# Patient Record
Sex: Male | Born: 1940 | Race: White | Hispanic: No | Marital: Married | Smoking: Former smoker
Health system: Southern US, Community
[De-identification: ages and names within clinical notes are randomized; demographics above are authoritative.]

## PROBLEM LIST (undated history)

## (undated) DIAGNOSIS — I480 Paroxysmal atrial fibrillation: Secondary | ICD-10-CM

## (undated) DIAGNOSIS — K52832 Lymphocytic colitis: Secondary | ICD-10-CM

## (undated) DIAGNOSIS — H409 Unspecified glaucoma: Secondary | ICD-10-CM

## (undated) DIAGNOSIS — H269 Unspecified cataract: Secondary | ICD-10-CM

## (undated) DIAGNOSIS — Z8719 Personal history of other diseases of the digestive system: Secondary | ICD-10-CM

## (undated) DIAGNOSIS — J449 Chronic obstructive pulmonary disease, unspecified: Secondary | ICD-10-CM

## (undated) DIAGNOSIS — I442 Atrioventricular block, complete: Secondary | ICD-10-CM

## (undated) DIAGNOSIS — K859 Acute pancreatitis without necrosis or infection, unspecified: Secondary | ICD-10-CM

## (undated) DIAGNOSIS — Z950 Presence of cardiac pacemaker: Secondary | ICD-10-CM

## (undated) DIAGNOSIS — K219 Gastro-esophageal reflux disease without esophagitis: Secondary | ICD-10-CM

## (undated) DIAGNOSIS — B019 Varicella without complication: Secondary | ICD-10-CM

## (undated) DIAGNOSIS — M359 Systemic involvement of connective tissue, unspecified: Secondary | ICD-10-CM

## (undated) DIAGNOSIS — M1990 Unspecified osteoarthritis, unspecified site: Secondary | ICD-10-CM

## (undated) DIAGNOSIS — C801 Malignant (primary) neoplasm, unspecified: Secondary | ICD-10-CM

## (undated) DIAGNOSIS — I951 Orthostatic hypotension: Secondary | ICD-10-CM

## (undated) DIAGNOSIS — Z8711 Personal history of peptic ulcer disease: Secondary | ICD-10-CM

## (undated) DIAGNOSIS — E785 Hyperlipidemia, unspecified: Secondary | ICD-10-CM

## (undated) DIAGNOSIS — K5792 Diverticulitis of intestine, part unspecified, without perforation or abscess without bleeding: Secondary | ICD-10-CM

## (undated) HISTORY — DX: Lymphocytic colitis: K52.832

## (undated) HISTORY — DX: Hyperlipidemia, unspecified: E78.5

## (undated) HISTORY — DX: Diverticulitis of intestine, part unspecified, without perforation or abscess without bleeding: K57.92

## (undated) HISTORY — DX: Chronic obstructive pulmonary disease, unspecified: J44.9

## (undated) HISTORY — PX: APPENDECTOMY: SHX54

## (undated) HISTORY — DX: Unspecified osteoarthritis, unspecified site: M19.90

## (undated) HISTORY — PX: TONSILLECTOMY AND ADENOIDECTOMY: SUR1326

## (undated) HISTORY — PX: PACEMAKER INSERTION: SHX728

## (undated) HISTORY — PX: COLON SURGERY: SHX602

## (undated) HISTORY — PX: BACK SURGERY: SHX140

## (undated) HISTORY — PX: OTHER SURGICAL HISTORY: SHX169

## (undated) HISTORY — DX: Personal history of peptic ulcer disease: Z87.11

## (undated) HISTORY — PX: EYE SURGERY: SHX253

## (undated) HISTORY — DX: Acute pancreatitis without necrosis or infection, unspecified: K85.90

## (undated) HISTORY — DX: Unspecified glaucoma: H40.9

## (undated) HISTORY — DX: Personal history of other diseases of the digestive system: Z87.19

## (undated) HISTORY — PX: CARDIAC CATHETERIZATION: SHX172

## (undated) HISTORY — PX: CATARACT EXTRACTION W/ INTRAOCULAR LENS IMPLANT: SHX1309

## (undated) HISTORY — DX: Varicella without complication: B01.9

## (undated) HISTORY — DX: Unspecified cataract: H26.9

---

## 1991-02-21 HISTORY — PX: INSERT / REPLACE / REMOVE PACEMAKER: SUR710

## 2011-04-27 LAB — HM COLONOSCOPY

## 2013-02-20 DIAGNOSIS — H409 Unspecified glaucoma: Secondary | ICD-10-CM

## 2013-02-20 HISTORY — DX: Unspecified glaucoma: H40.9

## 2013-05-26 LAB — BASIC METABOLIC PANEL
Creatinine: 1.1 mg/dL (ref 0.6–1.3)
Glucose: 92 mg/dL
Potassium: 4.6 mmol/L (ref 3.4–5.3)
Sodium: 138 mmol/L (ref 137–147)

## 2013-05-26 LAB — LIPID PANEL
Cholesterol: 134 mg/dL (ref 0–200)
HDL: 55 mg/dL (ref 35–70)
LDL Cholesterol: 62 mg/dL
Triglycerides: 87 mg/dL (ref 40–160)

## 2013-05-26 LAB — HEPATIC FUNCTION PANEL
ALT: 14 U/L (ref 10–40)
AST: 27 U/L (ref 14–40)
Alkaline Phosphatase: 55 U/L (ref 25–125)
Bilirubin, Total: 0.4 mg/dL

## 2014-01-27 ENCOUNTER — Ambulatory Visit (INDEPENDENT_AMBULATORY_CARE_PROVIDER_SITE_OTHER): Payer: Medicare PPO | Admitting: Family Medicine

## 2014-01-27 ENCOUNTER — Encounter (INDEPENDENT_AMBULATORY_CARE_PROVIDER_SITE_OTHER): Payer: Self-pay

## 2014-01-27 ENCOUNTER — Encounter: Payer: Self-pay | Admitting: Family Medicine

## 2014-01-27 VITALS — BP 118/76 | HR 80 | Temp 97.9°F | Ht 66.5 in | Wt 166.0 lb

## 2014-01-27 DIAGNOSIS — E781 Pure hyperglyceridemia: Secondary | ICD-10-CM

## 2014-01-27 DIAGNOSIS — R351 Nocturia: Secondary | ICD-10-CM

## 2014-01-27 DIAGNOSIS — N401 Enlarged prostate with lower urinary tract symptoms: Secondary | ICD-10-CM | POA: Insufficient documentation

## 2014-01-27 DIAGNOSIS — Z950 Presence of cardiac pacemaker: Secondary | ICD-10-CM | POA: Insufficient documentation

## 2014-01-27 DIAGNOSIS — Z87898 Personal history of other specified conditions: Secondary | ICD-10-CM | POA: Insufficient documentation

## 2014-01-27 DIAGNOSIS — I10 Essential (primary) hypertension: Secondary | ICD-10-CM

## 2014-01-27 DIAGNOSIS — Z86010 Personal history of colonic polyps: Secondary | ICD-10-CM

## 2014-01-27 DIAGNOSIS — Z9189 Other specified personal risk factors, not elsewhere classified: Secondary | ICD-10-CM

## 2014-01-27 DIAGNOSIS — Z720 Tobacco use: Secondary | ICD-10-CM | POA: Insufficient documentation

## 2014-01-27 DIAGNOSIS — E785 Hyperlipidemia, unspecified: Secondary | ICD-10-CM

## 2014-01-27 DIAGNOSIS — M5431 Sciatica, right side: Secondary | ICD-10-CM

## 2014-01-27 LAB — COMPREHENSIVE METABOLIC PANEL
ALT: 17 U/L (ref 0–53)
AST: 26 U/L (ref 0–37)
Albumin: 4.2 g/dL (ref 3.5–5.2)
Alkaline Phosphatase: 41 U/L (ref 39–117)
BUN: 17 mg/dL (ref 6–23)
CO2: 26 mEq/L (ref 19–32)
Calcium: 9.3 mg/dL (ref 8.4–10.5)
Chloride: 104 mEq/L (ref 96–112)
Creatinine, Ser: 1.2 mg/dL (ref 0.4–1.5)
GFR: 62.46 mL/min (ref >60.00)
Glucose, Bld: 93 mg/dL (ref 70–99)
Potassium: 4.6 mEq/L (ref 3.5–5.1)
Sodium: 137 mEq/L (ref 135–145)
Total Bilirubin: 1 mg/dL (ref 0.2–1.2)
Total Protein: 6.5 g/dL (ref 6.0–8.3)

## 2014-01-27 LAB — LIPID PANEL
Cholesterol: 133 mg/dL (ref 0–200)
HDL: 57.1 mg/dL (ref >39.00)
LDL Cholesterol: 58 mg/dL (ref 0–99)
NonHDL: 75.9
Total CHOL/HDL Ratio: 2
Triglycerides: 91 mg/dL (ref 0.0–149.0)
VLDL: 18.2 mg/dL (ref 0.0–40.0)

## 2014-01-27 LAB — PSA, MEDICARE: PSA: 0.38 ng/ml (ref 0.10–4.00)

## 2014-01-27 NOTE — Assessment & Plan Note (Signed)
Currently asymptomatic. Continue current rx and will review records once I receive them.

## 2014-01-27 NOTE — Assessment & Plan Note (Signed)
Very well controlled.  Did discussed decreasing dose of statin- we agreed to allow me to review his records first.

## 2014-01-27 NOTE — Progress Notes (Signed)
Subjective:   Patient ID: Darryl White, male    DOB: 02-10-1941, 73 y.o.   MRN: [id number]  Darryl White is a pleasant 73 y.o. year old male who presents to clinic today with [place]  on 01/27/2014  HPI:  Recently moved here from [place], [place] with his wife to be closer to his children and grandchildren. Retired Nature conservation officer.  Syncopal episodes- started 10-12 years ago.  Would occur 2-3 times per week.  Was referred to cardiologist at [facility].  After multiple tests, per pt, it was determined that episodes were caused by "constriction of vessel in his brain" that was leading to decreased oxygen to his brain.  He was then placed on Florinef and atenolol and pacer placed.  He has had no further episodes- that was 5 years ago.  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  Brings last in with him today from 05/2013. Lab Results  Component Value Date   CHOL 134 05/26/2013   HDL 55 05/26/2013   LDLCALC 62 05/26/2013   TRIG 87 05/26/2013   Tobacco abuse- > 35 year pack history.  Still smoking but feels he would consider quitting in future. Mom died of lung CA.  Has never had a low dose CT of chest for lung CA screening.  Denies hemoptysis, weight loss, fevers, chills or night sweats.  Low back pain with right sided sciatica- intermittent.  Uses very occasional tramadol- maybe once every few months.  Colon polyps- colonoscopy UTD (2013).  No recent blood in stool or changes in bowel habits.  Nocturia- increased in frequency over past 1-2 years.  Has no difficulty starting or stopping his stream.  No dysuria.  No pelvic pain. Has never taken any rx for BPH.  Per pt, had DRE in 05/2013. No current outpatient prescriptions on file prior to visit.   No current facility-administered medications on file prior to visit.    Allergies  Allergen Reactions  . Sulfa Antibiotics Hives and Itching  . Penicillins Hives and Palpitations    Past  Medical History  Diagnosis Date  . Chicken pox   . Diverticulitis   . History of fainting spells of unknown cause   . History of stomach ulcers   . Hyperlipidemia     Past Surgical History  Procedure Laterality Date  . Appendectomy    . Tonsillectomy and adenoidectomy    . Large intestine block    . Back surgery      Family History  Problem Relation Age of Onset  . Cancer Mother   . Heart disease Father     History   Social History  . Marital Status: Married    Spouse Name: N/A    Number of Children: N/A  . Years of Education: N/A   Occupational History  . Not on file.   Social History Main Topics  . Smoking status: Current Every Day Smoker  . Smokeless tobacco: Never Used  . Alcohol Use: Yes  . Drug Use: No  . Sexual Activity: No   Other Topics Concern  . Not on file   Social History Narrative   Married.   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   The PMH, Brush, Social History, Family History, Medications, and allergies have been reviewed in [facility], and have been updated if relevant.   Review of Systems  Constitutional: Negative for fever, chills, diaphoresis, appetite change, fatigue and unexpected weight change.  HENT: Negative.   Eyes: Negative.  Respiratory: Negative.   Cardiovascular: Negative.   Gastrointestinal: Negative.   Endocrine: Negative.   Musculoskeletal: Negative.   Skin: Negative.   Allergic/Immunologic: Negative.   Neurological: Negative.   Hematological: Negative.   Psychiatric/Behavioral: Negative.   All other systems reviewed and are negative.      Objective:    BP 118/76 mmHg  Pulse 80  Temp(Src) 97.9 F (36.6 C) (Oral)  Ht 5' 6.5" (1.689 m)  Wt 166 lb (75.297 kg)  BMI 26.39 kg/m2  SpO2 94%   Physical Exam  Constitutional: He is oriented to person, place, and time. He appears well-developed and well-nourished. No distress.  HENT:  Head: Normocephalic and atraumatic.  Eyes: Pupils are equal, round, and  reactive to light.  Cardiovascular: Normal rate, regular rhythm and normal heart sounds.   No murmur heard. Pulmonary/Chest: Effort normal and breath sounds normal. No respiratory distress. He has no wheezes. He has no rales. He exhibits no tenderness.  Musculoskeletal: Normal range of motion. He exhibits no edema.  Neurological: He is alert and oriented to person, place, and time. No cranial nerve deficit.  Skin: Skin is warm and dry.  Psychiatric: He has a normal mood and affect. His behavior is normal. Judgment and thought content normal.  Nursing note and vitals reviewed.         Assessment & Plan:   HLD (hyperlipidemia) - Plan: Comprehensive metabolic panel, Lipid panel  Essential hypertension  History of syncope  History of colonic polyps  Cardiac pacemaker  Hypertriglyceridemia  Sciatica of right side  Nocturia associated with benign prostatic hypertrophy - Plan: PSA, Medicare  Tobacco abuse - Plan: CT CHEST LOW DOSE SCREENING W/O CM No Follow-up on file.

## 2014-01-27 NOTE — Assessment & Plan Note (Signed)
New over past year or two.  Likely LUTs due to BPH. Per pt, unremarkable DRE in 05/2013. Check PSA today. He does not want rx at this time.

## 2014-01-27 NOTE — Assessment & Plan Note (Signed)
Per pt, placed after his persistent syncopal episodes- awaiting records.

## 2014-01-27 NOTE — Patient Instructions (Addendum)
It was lovely to meet you, Darryl White. I look forward to meeting your wife.  We will call you with your lab results and you can view them online.  We will call you with your low radiation dose chest CT.

## 2014-01-27 NOTE — Assessment & Plan Note (Signed)
Current asymptomatic. Agreed to refill occasional tramadol prn as he it seems he is using this properly (awaiting records).

## 2014-01-27 NOTE — Progress Notes (Signed)
Pre visit review using our clinic review tool, if applicable. No additional management support is needed unless otherwise documented below in the visit note. 

## 2014-01-27 NOTE — Assessment & Plan Note (Signed)
Smoking cessation instruction/counseling given:  counseled patient on the dangers of tobacco use, advised patient to stop smoking, and reviewed strategies to maximize success   He is not ready to quit but he is willing to have lung CA screening yearly- discussed risks and benefits of low radiation lung CT and order placed.

## 2014-01-28 ENCOUNTER — Telehealth: Payer: Self-pay | Admitting: Family Medicine

## 2014-01-28 NOTE — Telephone Encounter (Signed)
emmi emailed °

## 2014-01-29 ENCOUNTER — Telehealth: Payer: Self-pay | Admitting: Family Medicine

## 2014-01-29 ENCOUNTER — Encounter: Payer: Self-pay | Admitting: *Deleted

## 2014-01-29 NOTE — Telephone Encounter (Signed)
emmi emailed °

## 2014-02-09 ENCOUNTER — Encounter: Payer: Self-pay | Admitting: Family Medicine

## 2014-02-16 ENCOUNTER — Encounter: Payer: Self-pay | Admitting: Family Medicine

## 2014-02-17 ENCOUNTER — Encounter: Payer: Self-pay | Admitting: Family Medicine

## 2014-02-23 ENCOUNTER — Other Ambulatory Visit: Payer: Self-pay | Admitting: Family Medicine

## 2014-02-23 NOTE — Telephone Encounter (Signed)
Rx called in to requested pharmacy 

## 2014-02-23 NOTE — Telephone Encounter (Signed)
Pt requesting medication refill. Pt recently had establish appt 01/2014. pls advise

## 2014-02-24 ENCOUNTER — Encounter: Payer: Self-pay | Admitting: Family Medicine

## 2014-03-04 ENCOUNTER — Ambulatory Visit (INDEPENDENT_AMBULATORY_CARE_PROVIDER_SITE_OTHER)
Admission: RE | Admit: 2014-03-04 | Discharge: 2014-03-04 | Disposition: A | Discharge status: Home / Self Care | Payer: Medicare PPO | Source: Ambulatory Visit | Attending: Family Medicine | Admitting: Family Medicine

## 2014-03-04 DIAGNOSIS — Z720 Tobacco use: Secondary | ICD-10-CM

## 2014-03-05 ENCOUNTER — Telehealth: Payer: Self-pay | Admitting: Family Medicine

## 2014-03-05 NOTE — Telephone Encounter (Signed)
Patient saw you in December and wants to know when you want him to follow up. He said he usually follows up every 3-4 months and he has a lipid panel done.

## 2014-03-06 NOTE — Telephone Encounter (Signed)
Every three to four months if good with me as well.

## 2014-03-06 NOTE — Telephone Encounter (Signed)
Spoke to pt and advised per Dr Aron; pt verbally expressed understanding.  

## 2014-03-18 ENCOUNTER — Telehealth: Payer: Self-pay | Admitting: Family Medicine

## 2014-03-18 NOTE — Telephone Encounter (Signed)
Patient dropped off note card with his updated shots, cardiologist and GI provider.  He would like this updated information placed in his chart.  Placed card on [employer] desk. / lt

## 2014-03-18 NOTE — Telephone Encounter (Signed)
Card placed in Dr Hulen Shouts inbox for review.

## 2014-05-22 ENCOUNTER — Other Ambulatory Visit: Payer: Self-pay | Admitting: Family Medicine

## 2014-06-04 ENCOUNTER — Other Ambulatory Visit: Payer: Self-pay | Admitting: Family Medicine

## 2014-06-04 NOTE — Telephone Encounter (Signed)
Pt only seen for establish appt

## 2014-06-04 NOTE — Telephone Encounter (Signed)
Rx called in to requested pharmacy 

## 2014-09-28 ENCOUNTER — Other Ambulatory Visit: Payer: Self-pay | Admitting: Family Medicine

## 2014-09-28 NOTE — Telephone Encounter (Signed)
Rx called in to requested pharmacy 

## 2014-09-28 NOTE — Telephone Encounter (Signed)
Pt has not been seen since establishing care 01/2014

## 2014-10-12 ENCOUNTER — Observation Stay (HOSPITAL_COMMUNITY): Payer: Medicare PPO

## 2014-10-12 ENCOUNTER — Encounter (HOSPITAL_COMMUNITY): Payer: Self-pay | Admitting: Emergency Medicine

## 2014-10-12 ENCOUNTER — Other Ambulatory Visit (HOSPITAL_COMMUNITY): Payer: Medicare PPO

## 2014-10-12 ENCOUNTER — Emergency Department (HOSPITAL_COMMUNITY): Payer: Medicare PPO

## 2014-10-12 ENCOUNTER — Observation Stay (HOSPITAL_COMMUNITY)
Admission: EM | Admit: 2014-10-12 | Discharge: 2014-10-13 | Disposition: A | Discharge status: Home / Self Care | Payer: Medicare PPO | Attending: Cardiovascular Disease | Admitting: Cardiovascular Disease

## 2014-10-12 DIAGNOSIS — R0602 Shortness of breath: Secondary | ICD-10-CM | POA: Diagnosis not present

## 2014-10-12 DIAGNOSIS — R000 Tachycardia, unspecified: Secondary | ICD-10-CM | POA: Diagnosis not present

## 2014-10-12 DIAGNOSIS — I480 Paroxysmal atrial fibrillation: Secondary | ICD-10-CM | POA: Insufficient documentation

## 2014-10-12 DIAGNOSIS — Z880 Allergy status to penicillin: Secondary | ICD-10-CM | POA: Insufficient documentation

## 2014-10-12 DIAGNOSIS — I442 Atrioventricular block, complete: Secondary | ICD-10-CM | POA: Insufficient documentation

## 2014-10-12 DIAGNOSIS — R071 Chest pain on breathing: Secondary | ICD-10-CM | POA: Diagnosis not present

## 2014-10-12 DIAGNOSIS — Z87898 Personal history of other specified conditions: Secondary | ICD-10-CM

## 2014-10-12 DIAGNOSIS — K5792 Diverticulitis of intestine, part unspecified, without perforation or abscess without bleeding: Secondary | ICD-10-CM | POA: Diagnosis not present

## 2014-10-12 DIAGNOSIS — E785 Hyperlipidemia, unspecified: Secondary | ICD-10-CM | POA: Diagnosis present

## 2014-10-12 DIAGNOSIS — Z7982 Long term (current) use of aspirin: Secondary | ICD-10-CM | POA: Diagnosis not present

## 2014-10-12 DIAGNOSIS — Z79899 Other long term (current) drug therapy: Secondary | ICD-10-CM | POA: Insufficient documentation

## 2014-10-12 DIAGNOSIS — Z8619 Personal history of other infectious and parasitic diseases: Secondary | ICD-10-CM | POA: Diagnosis not present

## 2014-10-12 DIAGNOSIS — Z7951 Long term (current) use of inhaled steroids: Secondary | ICD-10-CM | POA: Insufficient documentation

## 2014-10-12 DIAGNOSIS — Z720 Tobacco use: Secondary | ICD-10-CM | POA: Insufficient documentation

## 2014-10-12 DIAGNOSIS — I4891 Unspecified atrial fibrillation: Secondary | ICD-10-CM | POA: Diagnosis not present

## 2014-10-12 DIAGNOSIS — Z950 Presence of cardiac pacemaker: Secondary | ICD-10-CM | POA: Diagnosis present

## 2014-10-12 DIAGNOSIS — I951 Orthostatic hypotension: Secondary | ICD-10-CM | POA: Diagnosis not present

## 2014-10-12 DIAGNOSIS — I959 Hypotension, unspecified: Secondary | ICD-10-CM

## 2014-10-12 DIAGNOSIS — R079 Chest pain, unspecified: Secondary | ICD-10-CM | POA: Diagnosis present

## 2014-10-12 HISTORY — DX: Orthostatic hypotension: I95.1

## 2014-10-12 HISTORY — DX: Paroxysmal atrial fibrillation: I48.0

## 2014-10-12 HISTORY — DX: Atrioventricular block, complete: I44.2

## 2014-10-12 LAB — BASIC METABOLIC PANEL
Anion gap: 9 (ref 5–15)
BUN: 21 mg/dL — ABNORMAL HIGH (ref 6–20)
CO2: 21 mmol/L — ABNORMAL LOW (ref 22–32)
Calcium: 9.1 mg/dL (ref 8.9–10.3)
Chloride: 104 mmol/L (ref 101–111)
Creatinine, Ser: 1.2 mg/dL (ref 0.61–1.24)
GFR calc Af Amer: >60 mL/min (ref >60)
GFR calc non Af Amer: 58 mL/min — ABNORMAL LOW (ref >60)
Glucose, Bld: 111 mg/dL — ABNORMAL HIGH (ref 65–99)
Potassium: 4.1 mmol/L (ref 3.5–5.1)
Sodium: 134 mmol/L — ABNORMAL LOW (ref 135–145)

## 2014-10-12 LAB — CBC
HCT: 41.2 % (ref 39.0–52.0)
Hemoglobin: 14.2 g/dL (ref 13.0–17.0)
MCH: 33.5 pg (ref 26.0–34.0)
MCHC: 34.5 g/dL (ref 30.0–36.0)
MCV: 97.2 fL (ref 78.0–100.0)
Platelets: 196 10*3/uL (ref 150–400)
RBC: 4.24 MIL/uL (ref 4.22–5.81)
RDW: 13.8 % (ref 11.5–15.5)
WBC: 8.4 10*3/uL (ref 4.0–10.5)

## 2014-10-12 LAB — URINALYSIS, ROUTINE W REFLEX MICROSCOPIC
Bilirubin Urine: NEGATIVE
Glucose, UA: NEGATIVE mg/dL
Hgb urine dipstick: NEGATIVE
Ketones, ur: NEGATIVE mg/dL
Leukocytes, UA: NEGATIVE
Nitrite: NEGATIVE
Protein, ur: NEGATIVE mg/dL
Specific Gravity, Urine: 1.012 (ref 1.005–1.030)
Urobilinogen, UA: 0.2 mg/dL (ref 0.0–1.0)
pH: 6 (ref 5.0–8.0)

## 2014-10-12 LAB — LIPID PANEL
Cholesterol: 109 mg/dL (ref 0–200)
HDL: 45 mg/dL (ref >40)
LDL Cholesterol: 50 mg/dL (ref 0–99)
Total CHOL/HDL Ratio: 2.4 RATIO
Triglycerides: 70 mg/dL (ref <150)
VLDL: 14 mg/dL (ref 0–40)

## 2014-10-12 LAB — T4, FREE: Free T4: 0.99 ng/dL (ref 0.61–1.12)

## 2014-10-12 LAB — HEPATIC FUNCTION PANEL
ALT: 17 U/L (ref 17–63)
AST: 43 U/L — ABNORMAL HIGH (ref 15–41)
Albumin: 3.4 g/dL — ABNORMAL LOW (ref 3.5–5.0)
Alkaline Phosphatase: 39 U/L (ref 38–126)
Bilirubin, Direct: 0.2 mg/dL (ref 0.1–0.5)
Indirect Bilirubin: 0.3 mg/dL (ref 0.3–0.9)
Total Bilirubin: 0.5 mg/dL (ref 0.3–1.2)
Total Protein: 5.4 g/dL — ABNORMAL LOW (ref 6.5–8.1)

## 2014-10-12 LAB — TSH: TSH: 1.403 u[IU]/mL (ref 0.350–4.500)

## 2014-10-12 LAB — HEPARIN LEVEL (UNFRACTIONATED): Heparin Unfractionated: 0.56 IU/mL (ref 0.30–0.70)

## 2014-10-12 LAB — MAGNESIUM: Magnesium: 1.9 mg/dL (ref 1.7–2.4)

## 2014-10-12 LAB — I-STAT TROPONIN, ED: Troponin i, poc: 0.02 ng/mL (ref 0.00–0.08)

## 2014-10-12 MED ORDER — DILTIAZEM HCL 100 MG IV SOLR: 5.0000 mg/h | INTRAVENOUS | Status: DC

## 2014-10-12 MED ORDER — SODIUM CHLORIDE 0.9 % IV BOLUS (SEPSIS)
1000.0000 mL | Freq: Once | INTRAVENOUS | Status: AC
Start: 2014-10-12 — End: 2014-10-12
  Administered 2014-10-12: 1000 mL via INTRAVENOUS

## 2014-10-12 MED ORDER — HEPARIN BOLUS VIA INFUSION
4000.0000 [IU] | Freq: Once | INTRAVENOUS | Status: AC
  Administered 2014-10-12: 4000 [IU] via INTRAVENOUS
  Filled 2014-10-12: qty 4000

## 2014-10-12 MED ORDER — VITAMIN D 1000 UNITS PO TABS
2000.0000 [IU] | ORAL_TABLET | Freq: Every day | ORAL | Status: DC
  Administered 2014-10-13: 2000 [IU] via ORAL
  Filled 2014-10-12 (×2): qty 2

## 2014-10-12 MED ORDER — TECHNETIUM TC 99M SESTAMIBI GENERIC - CARDIOLITE
30.0000 | Freq: Once | INTRAVENOUS | Status: AC | PRN
  Administered 2014-10-12: 30 via INTRAVENOUS

## 2014-10-12 MED ORDER — ONDANSETRON HCL 4 MG/2ML IJ SOLN: 4.0000 mg | Freq: Four times a day (QID) | INTRAMUSCULAR | Status: DC | PRN

## 2014-10-12 MED ORDER — AMIODARONE IV BOLUS ONLY 150 MG/100ML: 150.0000 mg | Freq: Once | INTRAVENOUS | Status: DC

## 2014-10-12 MED ORDER — ASPIRIN 81 MG PO CHEW
81.0000 mg | CHEWABLE_TABLET | Freq: Every day | ORAL | Status: DC
  Administered 2014-10-13: 81 mg via ORAL
  Filled 2014-10-12: qty 1

## 2014-10-12 MED ORDER — AMIODARONE LOAD VIA INFUSION
150.0000 mg | Freq: Once | INTRAVENOUS | Status: AC
  Administered 2014-10-12: 150 mg via INTRAVENOUS
  Filled 2014-10-12: qty 83.34

## 2014-10-12 MED ORDER — ZOLPIDEM TARTRATE 5 MG PO TABS
5.0000 mg | ORAL_TABLET | Freq: Every evening | ORAL | Status: DC | PRN
  Administered 2014-10-12: 5 mg via ORAL
  Filled 2014-10-12: qty 1

## 2014-10-12 MED ORDER — HEPARIN (PORCINE) IN NACL 100-0.45 UNIT/ML-% IJ SOLN
1200.0000 [IU]/h | INTRAMUSCULAR | Status: DC
  Administered 2014-10-12 (×2): 1200 [IU]/h via INTRAVENOUS
  Filled 2014-10-12 (×3): qty 250

## 2014-10-12 MED ORDER — REGADENOSON 0.4 MG/5ML IV SOLN
INTRAVENOUS | Status: AC
  Administered 2014-10-12: 0.4 mg via INTRAVENOUS
  Filled 2014-10-12: qty 5

## 2014-10-12 MED ORDER — ACETAMINOPHEN 325 MG PO TABS: 650.0000 mg | ORAL_TABLET | ORAL | Status: DC | PRN

## 2014-10-12 MED ORDER — TRAMADOL HCL 50 MG PO TABS: 50.0000 mg | ORAL_TABLET | ORAL | Status: DC | PRN

## 2014-10-12 MED ORDER — DILTIAZEM LOAD VIA INFUSION
10.0000 mg | Freq: Once | INTRAVENOUS | Status: DC
  Filled 2014-10-12: qty 10

## 2014-10-12 MED ORDER — TECHNETIUM TC 99M SESTAMIBI GENERIC - CARDIOLITE
10.0000 | Freq: Once | INTRAVENOUS | Status: AC | PRN
  Administered 2014-10-12: 10 via INTRAVENOUS

## 2014-10-12 MED ORDER — REGADENOSON 0.4 MG/5ML IV SOLN
0.4000 mg | Freq: Once | INTRAVENOUS | Status: AC
  Administered 2014-10-12: 0.4 mg via INTRAVENOUS

## 2014-10-12 MED ORDER — BRIMONIDINE TARTRATE 0.2 % OP SOLN
1.0000 [drp] | Freq: Two times a day (BID) | OPHTHALMIC | Status: DC
  Filled 2014-10-12: qty 5

## 2014-10-12 MED ORDER — FLUDROCORTISONE ACETATE 0.1 MG PO TABS: 0.1000 mg | ORAL_TABLET | ORAL | Status: DC

## 2014-10-12 MED ORDER — AMIODARONE HCL IN DEXTROSE 360-4.14 MG/200ML-% IV SOLN
60.0000 mg/h | Freq: Once | INTRAVENOUS | Status: AC
  Administered 2014-10-12: 60 mg/h via INTRAVENOUS
  Filled 2014-10-12: qty 200

## 2014-10-12 MED ORDER — BRIMONIDINE TARTRATE 0.15 % OP SOLN
1.0000 [drp] | Freq: Two times a day (BID) | OPHTHALMIC | Status: DC
  Filled 2014-10-12 (×2): qty 5

## 2014-10-12 MED ORDER — SIMVASTATIN 40 MG PO TABS
40.0000 mg | ORAL_TABLET | Freq: Every day | ORAL | Status: DC
  Administered 2014-10-12: 40 mg via ORAL
  Filled 2014-10-12: qty 1

## 2014-10-12 MED ORDER — FENOFIBRATE 160 MG PO TABS
160.0000 mg | ORAL_TABLET | Freq: Every day | ORAL | Status: DC
  Administered 2014-10-13: 160 mg via ORAL
  Filled 2014-10-12: qty 1

## 2014-10-12 NOTE — ED Notes (Signed)
Pt not in room- pt remains in NM

## 2014-10-12 NOTE — ED Notes (Signed)
Patient here with complaint of chest pressure starting this AM coupled with shortness of breath. Explains that he was laying down attempting to sleep and could not get comfortable. Could feel "fluttering" in his chest at that time. EMS gave 324ASA, 1x NTG SL 0.'4mg'$ , and approximately 514m of NS.

## 2014-10-12 NOTE — ED Notes (Signed)
MD at bedside. 

## 2014-10-12 NOTE — ED Provider Notes (Signed)
CSN: [id number]     Arrival date & time 10/12/14  0151 History   This chart was scribed for Yusra Ravert, MD by Forrestine Him, ED Scribe. This patient was seen in room B16C/B16C and the patient's care was started 2:05 AM.   Chief Complaint  Patient presents with  . Chest Pain   Patient is a 74 y.o. male presenting with palpitations. The history is provided by the patient. No language interpreter was used.  Palpitations Palpitations quality:  Fast Onset quality:  Sudden Timing:  Constant Progression:  Unchanged Chronicity:  New Context: not anxiety   Relieved by:  Nothing Worsened by:  Nothing Ineffective treatments:  None tried Associated symptoms: chest pain and shortness of breath   Associated symptoms: no cough, no dizziness, no nausea and no vomiting     HPI Comments: Zaylin Runco brought in by EMS is a 74 y.o. male with a PMHx of [employer]tr[employer] pacemaker and hyperlipidemia who presents to the Emergency Department complaining of constant, ongoing chest pain with associated mild shortness of breath onset 2 hours prior to arrival while resting in bed. Pain is described as pressure and "fluttering" in chest. 324 ASA and 1 dose of Nitro SL given en route to department. No recent fever or chills. Mr. Miles is followed by Dr. Dudley Major at [facility]. Past implanted device performed by Dr. Marjo Bicker at [facility]- most recently 5 years ago, but initial device implanted 12 years prior. Denies any previous stent placements. He is not on any anticoagulants. Pt with known allergies to Sulfa antibiotics and Penicillins.  Past Medical History  Diagnosis Date  . Chicken pox   . Diverticulitis   . History of fainting spells of unknown cause   . History of stomach ulcers   . Hyperlipidemia    Past Surgical History  Procedure Laterality Date  . Appendectomy    . Tonsillectomy and adenoidectomy    . Large intestine block    . Back surgery     Family History  Problem Relation Age of Onset  .  Cancer Mother   . Heart disease Father    Social History  Substance Use Topics  . Smoking status: Current Every Day Smoker  . Smokeless tobacco: Never Used  . Alcohol Use: Yes    Review of Systems  Constitutional: Negative for fever and chills.  Respiratory: Positive for shortness of breath. Negative for cough.   Cardiovascular: Positive for chest pain and palpitations. Negative for leg swelling.  Gastrointestinal: Negative for nausea, vomiting, abdominal pain and diarrhea.  Neurological: Negative for dizziness and headaches.  Psychiatric/Behavioral: Negative for confusion.  All other systems reviewed and are negative.     Allergies  Sulfa antibiotics and Penicillins  Home Medications   Prior to Admission medications   Medication Sig Start Date End Date Taking? Authorizing Provider  aspirin 81 MG tablet Take 81 mg by mouth daily.    Historical Provider, MD  atenolol (TENORMIN) 25 MG tablet Take 25 mg by mouth daily.    Historical Provider, MD  brimonidine (ALPHAGAN) 0.15 % ophthalmic solution Place 1 drop into both eyes daily.    Historical Provider, MD  Cholecalciferol (VITAMIN D3) 2000 UNITS capsule Take 2,000 Units by mouth daily.    Historical Provider, MD  fenofibrate (TRICOR) 145 MG tablet Take 145 mg by mouth daily.    Historical Provider, MD  fludrocortisone (FLORINEF) 0.1 MG tablet Take 0.1 mg by mouth daily.    Historical Provider, MD  simvastatin (ZOCOR) 40 MG  tablet take 1 tablet by mouth at bedtime 05/22/14   Lucille Passy, MD  traMADol Veatrice Bourbon) 50 MG tablet take 1 tablet by mouth every 4 hours if needed 09/28/14   Lucille Passy, MD   Triage Vitals: BP 85/59 mmHg  Pulse 115  Temp(Src) 98.2 F (36.8 C) (Oral)  Resp 22  SpO2 96%   Physical Exam  Constitutional: He is oriented to person, place, and time. He appears well-developed and well-nourished. No distress.  HENT:  Head: Normocephalic and atraumatic.  Mouth/Throat: Oropharynx is clear and moist.  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  Neck: Normal range of motion.  Cardiovascular: Regular rhythm, S1 normal, S2 normal, normal heart sounds and intact distal pulses.  Tachycardia present.   Pulmonary/Chest: Effort normal and breath sounds normal. No respiratory distress. He has no wheezes. He has no rales.  Abdominal: Soft. Bowel sounds are normal. He exhibits no distension. There is no tenderness. There is no rebound and no guarding.  Musculoskeletal: Normal range of motion. He exhibits no edema.  Neurological: He is alert and oriented to person, place, and time.  Skin: Skin is warm and dry. He is not diaphoretic.  Psychiatric: He has a normal mood and affect. Judgment normal.  Nursing note and vitals reviewed.   ED Course  Procedures (including critical care time)  DIAGNOSTIC STUDIES: Oxygen Saturation is 96% on RA, adequate by my interpretation.    COORDINATION OF CARE: 2:07 AM- Will order blood work and EKG. Discussed treatment plan with pt at bedside and pt agreed to plan.     Labs Review Labs Reviewed - No data to display  Imaging Review No results found. I have personally reviewed and evaluated these images and lab results as part of my medical decision-making.   EKG Interpretation None      MDM   Final diagnoses:  None    Medications  heparin ADULT infusion 100 units/mL (25000 units/250 mL) (1,200 Units/hr Intravenous New Bag/Given 10/12/14 0602)  sodium chloride 0.9 % bolus 1,000 mL (0 mLs Intravenous Stopped 10/12/14 0325)  amiodarone (NEXTERONE PREMIX) 360 MG/200ML (1.8 mg/mL) IV infusion (60 mg/hr Intravenous New Bag/Given 10/12/14 0601)  amiodarone (NEXTERONE) 1.8 mg/mL load via infusion 150 mg (150 mg Intravenous Bolus from Bag 10/12/14 0605)  heparin bolus via infusion 4,000 Units (4,000 Units Intravenous Given 10/12/14 0603)   Results for orders placed or performed during the hospital encounter of 81/01/75  Basic metabolic panel   Result Value Ref Range   Sodium 134 (L) 135 - 145 mmol/L   Potassium 4.1 3.5 - 5.1 mmol/L   Chloride 104 101 - 111 mmol/L   CO2 21 (L) 22 - 32 mmol/L   Glucose, Bld 111 (H) 65 - 99 mg/dL   BUN 21 (H) 6 - 20 mg/dL   Creatinine, Ser 1.20 0.61 - 1.24 mg/dL   Calcium 9.1 8.9 - 10.3 mg/dL   GFR calc non Af Amer 58 (L) >60 mL/min   GFR calc Af Amer >60 >60 mL/min   Anion gap 9 5 - 15  CBC  Result Value Ref Range   WBC 8.4 4.0 - 10.5 K/uL   RBC 4.24 4.22 - 5.81 MIL/uL   Hemoglobin 14.2 13.0 - 17.0 g/dL   HCT 41.2 39.0 - 52.0 %   MCV 97.2 78.0 - 100.0 fL   MCH 33.5 26.0 - 34.0 pg   MCHC 34.5 30.0 - 36.0 g/dL   RDW 13.8  11.5 - 15.5 %   Platelets 196 150 - 400 K/uL  I-stat troponin, ED  Result Value Ref Range   Troponin i, poc 0.02 0.00 - 0.08 ng/mL   Comment 3           Dg Chest 2 View  10/12/2014   CLINICAL DATA:  Chest tightness, shortness of breath, and tachycardia. Smoker.  EXAM: CHEST  2 VIEW  COMPARISON:  CT chest 03/04/2014  FINDINGS: Cardiac pacemaker. Normal heart size and pulmonary vascularity. Mediastinal contours appear intact. Calcified and tortuous aorta. 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 Appearance is similar to previous study. No developing consolidation. No pneumothorax. No blunting of costophrenic angles.  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   Electronically Signed   By: Lucienne Capers M.D.   On: 10/12/2014 03:04    MDM Reviewed: nursing note and vitals Interpretation: labs, x-ray and ECG (all labs normal no chf by me on cxr) Total time providing critical care: 30-74 minutes. This excludes time spent performing separately reportable procedures and services. Consults: cardiology (2 medtronic reps)  CRITICAL CARE Performed by: Carlisle Beers Total critical care time: 60 minutes   Critical care time was exclusive of separately billable procedures and treating other  patients. Critical care was necessary to treat or prevent imminent or life-threatening deterioration. Critical care was time spent personally by me on the following activities: development of treatment plan with patient and/or surrogate as well as nursing, discussions with consultants, evaluation of patient's response to treatment, examination of patient, obtaining history from patient or surrogate, ordering and performing treatments and interventions, ordering and review of laboratory studies, ordering and review of radiographic studies, pulse oximetry and re-evaluation of patient's condition.   The patient appears reasonably stabilized for admission considering the current resources, flow, and capabilities available in the ED at this time, and I doubt any other [facility] requiring further screening and/or treatment in the ED prior to admission.  I personally performed the services described in this documentation, which was scribed in my presence. The recorded information has been reviewed and is accurate.    Veatrice Kells, MD 10/12/14 [phone]

## 2014-10-12 NOTE — Progress Notes (Signed)
ANTICOAGULATION CONSULT NOTE - FOLLOW UP    HL = 0.56 (goal 0.3 - 0.7 units/mL) Heparin dosing weight = 75 kg   Assessment: 73 YOM with Afib and ACS to continue on IV heparin.  Heparin level therapeutic (drawn 9 hrs post heparin started); no bleeding reported.   Plan: - Continue heparin gtt at 1200 units/hr - F/U AM labs    Victor Granados D. Mina Marble, PharmD, BCPS 10/12/2014, 4:32 PM

## 2014-10-12 NOTE — Progress Notes (Signed)
lexiscan myoview completed without complications.  Nuc results to follow. 

## 2014-10-12 NOTE — H&P (Addendum)
Patient ID: Darryl White MRN: [id number] DOB/AGE: 1940/06/19 74 y.o.  Admit date: 10/12/2014 Primary Physician   Arnette Norris, MD Primary Cardiologist   Dr. Dudley Major [facility])  Chief Complaint   Atrial fibrillation   HPI: Darryl White is a 74M with syncope/bradycardia s/p florinef and PPM, HL who presented to the ED with his first episode of afib with RVR.  Darryl White was awakened from sleep with heart pounding.  He felt chest pressure, headache and palpitations.  He did not note lightheadedness or dizziness.  There was no associated nausea or diaphoresis. The symptoms were not exertional.  He and his wife decided to present to the ED for evaluation.  EMS was called and upon their arrival he received ASA '324mg'$  and 549m of NS.  In the ED his Medtronic PPM was interrogated, and he was found to be in atrial fibrillation for <24 hours.  He was started on a heparin infusion.  His BP was 85/59 so he continued to receive IV fluids.  An amiodarone bolus and infusion was initiated.   Review of Systems:     Cardiac Review of Systems: {Y] = yes '[ ]'$  = no  Chest Pain [Blue.Reese   ]  Resting SOB [   ] Exertional SOB  [  ]  Orthopnea [  ]   Pedal Edema [   ]    Palpitations [ y ] Syncope  [  ]   Presyncope [   ]  General Review of Systems: [Y] = yes [  ]=no Constitional: recent weight change [  ]; anorexia [  ]; fatigue [  ]; nausea [  ]; night sweats [  ]; fever [  ]; or chills [  ];                                                                                                                                        Dental: poor dentition[  ];  Eye : blurred vision [  ]; diplopia [   ]; vision changes [  ];  Amaurosis fugax[  ]; Resp: cough [  ];  wheezing[  ];  hemoptysis[  ]; shortness of breath[  ]; paroxysmal nocturnal dyspnea[  ]; dyspnea on exertion[  ]; or orthopnea[  ];  GI:  gallstones[  ], vomiting[  ];  dysphagia[  ]; melena[  ];  hematochezia [  ]; heartburn[  ];   Hx of  Colonoscopy[  ]; GU:  kidney stones [  ]; hematuria[  ];   dysuria [  ];  nocturia[  ];  history of     obstruction [  ];                 Skin: rash, swelling[  ];, hair loss[  ];  peripheral edema[  ];  or itching[  ]; Musculosketetal: myalgias[  ];  joint  swelling[  ];  joint erythema[  ];  joint pain[  ];  back pain[  ];  Heme/Lymph: bruising[  ];  bleeding[  ];  anemia[  ];  Neuro: TIA[  ];  headaches[y  ];  stroke[  ];  vertigo[  ];  seizures[  ];   paresthesias[  ];  difficulty walking[  ];  Psych:depression[  ]; anxiety[  ];  Endocrine: diabetes[  ];  thyroid dysfunction[  ];  Immunizations: Flu [  ]; Pneumococcal[  ];  Other:  Past Medical History  Diagnosis Date  . Chicken pox   . Diverticulitis   . History of fainting spells of unknown cause   . History of stomach ulcers   . Hyperlipidemia      (Not in a hospital admission)   Allergies  Allergen Reactions  . Sulfa Antibiotics Hives and Itching  . Penicillins Hives and Palpitations    Social History   Social History  . Marital Status: Married    Spouse Name: N/A  . Number of Children: N/A  . Years of Education: N/A   Occupational History  . Not on file.   Social History Main Topics  . Smoking status: Current Every Day Smoker  . Smokeless tobacco: Never Used  . Alcohol Use: Yes  . Drug Use: No  . Sexual Activity: No   Other Topics Concern  . Not on file   Social History Narrative   Married.   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    Family History  Problem Relation Age of Onset  . Cancer Mother   . Heart disease Father     PHYSICAL EXAM: Filed Vitals:   10/12/14 0930  BP: 89/58  Pulse: 82  Temp:   Resp: 17   General:  Well appearing. No respiratory difficulty HEENT: normal Neck: supple. no JVD. Carotids 2+ bilat; no bruits. No lymphadenopathy or thryomegaly appreciated. Cor: PMI nondisplaced  Irregularly irregular. No rubs, gallops or murmurs. Lungs: clear Abdomen: soft, nontender,  nondistended. No hepatosplenomegaly. No bruits or masses. Good bowel sounds. Extremities: no cyanosis, clubbing, rash, edema Neuro: alert & oriented x 3, cranial nerves grossly intact. moves all 4 extremities w/o difficulty. Affect pleasant.   Results for orders placed or performed during the hospital encounter of 10/12/14 (from the past 24 hour(s))  Basic metabolic panel     Status: Abnormal   Collection Time: 10/12/14  2:45 AM  Result Value Ref Range   Sodium 134 (L) 135 - 145 mmol/L   Potassium 4.1 3.5 - 5.1 mmol/L   Chloride 104 101 - 111 mmol/L   CO2 21 (L) 22 - 32 mmol/L   Glucose, Bld 111 (H) 65 - 99 mg/dL   BUN 21 (H) 6 - 20 mg/dL   Creatinine, Ser 1.20 0.61 - 1.24 mg/dL   Calcium 9.1 8.9 - 10.3 mg/dL   GFR calc non Af Amer 58 (L) >60 mL/min   GFR calc Af Amer >60 >60 mL/min   Anion gap 9 5 - 15  CBC     Status: None   Collection Time: 10/12/14  2:45 AM  Result Value Ref Range   WBC 8.4 4.0 - 10.5 K/uL   RBC 4.24 4.22 - 5.81 MIL/uL   Hemoglobin 14.2 13.0 - 17.0 g/dL   HCT 41.2 39.0 - 52.0 %   MCV 97.2 78.0 - 100.0 fL   MCH 33.5 26.0 - 34.0 pg   MCHC 34.5 30.0 - 36.0 g/dL   RDW 13.8 11.5 -  15.5 %   Platelets 196 150 - 400 K/uL  I-stat troponin, ED     Status: None   Collection Time: 10/12/14  2:49 AM  Result Value Ref Range   Troponin i, poc 0.02 0.00 - 0.08 ng/mL   Comment 3           Dg Chest 2 View  10/12/2014   CLINICAL DATA:  Chest tightness, shortness of breath, and tachycardia. Smoker.  EXAM: CHEST  2 VIEW  COMPARISON:  CT chest 03/04/2014  FINDINGS: Cardiac pacemaker. Normal heart size and pulmonary vascularity. Mediastinal contours appear intact. Calcified and tortuous aorta. 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 Appearance is similar to previous study. No developing consolidation. No pneumothorax. No blunting of costophrenic angles.  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    Electronically Signed   By: Lucienne Capers M.D.   On: 10/12/2014 03:04     ECG: Atrial fibrillation.  VP at 124 bpm.    ASSESSMENT/Plan: 74M with syncope/bradycardia s/p florinef and PPM, HL who presented to the ED with his first episode of afib with RVR.    # Atrial fibrillation/RVR: Darryl White is here with his first episode of atrial fibrillation.  No clear precipitants.  He is unaware of any CAD, CHF or valvular heart disease history.  His hypotension makes rate control unlikely.  Given his low BP and history of syncope, aggressive nodal agents may not be the best course for him.  Rhythm control is likely to be his best option.  He was started on amiodarone acutely, but ideally he could be switched to a less toxic medication.  Will obtain an echo and Lexiscan Myoview to rule out structural heart disease and coronary artery disease.  If neither of the above are of concern, consider flecainide or sotalol. CHA2D2-VASc score is 0 with a HAS-BLED score of 2.  Therefore, recommend that he can continue his daily aspirin as prior to admission, but will not add anticoagulation at this time.  Will need to reconsider at age 40 or if there are new morbidities. - Repeat EKG now that rate is slower - echo - check LFTs, Mg, thyroid function - Lexiscan Myoview - flecainide vs sotalol vs propafenone if no structural heart disease or CAD after further testing - Continue ASA '81mg'$  - Continue amiodarone infusion and heparin for now - PPM reprogrammed from DDD to DDI  This patients CHA2DS2-VASc Score and unadjusted Ischemic Stroke Rate (% per year) is equal to 0.2 % stroke rate/year from a score of 0 Above score calculated as 1 point each if present [CHF, HTN, DM, Vascular=MI/PAD/Aortic Plaque, Age if 65-74, or Male] Above score calculated as 2 points each if present [Age > 75, or Stroke/TIA/TE]    # Hypotension: BP currently low and has responded minimally to fluid boluses. - hold home atenolol - check  U/A - rhythm rather than rate control of afib as above - continue florinef     Signed: Sharol Harness 10/12/2014, 9:43 AM

## 2014-10-12 NOTE — Progress Notes (Signed)
lexiscan myoview without ischemia, -normal scan. Will notify pt.

## 2014-10-12 NOTE — ED Notes (Signed)
Cardiology at bedside.

## 2014-10-12 NOTE — Progress Notes (Signed)
ANTICOAGULATION CONSULT NOTE - Initial Consult  Pharmacy Consult for Heparin Indication: chest pain/ACS/Afib  Allergies  Allergen Reactions  . Sulfa Antibiotics Hives and Itching  . Penicillins Hives and Palpitations    Patient Measurements:   Heparin Dosing Weight: 75 kg  Vital Signs: Temp: 98.2 F (36.8 C) (08/22 0156) Temp Source: Oral (08/22 0156) BP: 92/64 mmHg (08/22 0245) Pulse Rate: 121 (08/22 0245)  Labs:  Recent Labs  10/12/14 0245  HGB 14.2  HCT 41.2  PLT 196  CREATININE 1.20    CrCl cannot be calculated (Unknown ideal weight.).   Medical History: Past Medical History  Diagnosis Date  . Chicken pox   . Diverticulitis   . History of fainting spells of unknown cause   . History of stomach ulcers   . Hyperlipidemia     Medications:  Zocor  ASA   Atenolol  Alphagan  Vit D  Tricor  Florinef  Assessment: 74 y.o. male with chest pain/Afib for heparin  Goal of Therapy:  Heparin level 0.3-0.7 units/ml Monitor platelets by anticoagulation protocol: Yes   Plan:  Heparin 4000 units IV bolus, then start heparin 1200 units/hr Check heparin level in 6 hours.   Mohamed Portlock, Bronson Curb 10/12/2014,5:38 AM

## 2014-10-13 ENCOUNTER — Ambulatory Visit (HOSPITAL_BASED_OUTPATIENT_CLINIC_OR_DEPARTMENT_OTHER): Payer: Medicare PPO

## 2014-10-13 ENCOUNTER — Encounter (HOSPITAL_COMMUNITY): Payer: Self-pay

## 2014-10-13 DIAGNOSIS — I951 Orthostatic hypotension: Secondary | ICD-10-CM | POA: Diagnosis not present

## 2014-10-13 DIAGNOSIS — I4891 Unspecified atrial fibrillation: Secondary | ICD-10-CM | POA: Diagnosis not present

## 2014-10-13 DIAGNOSIS — I480 Paroxysmal atrial fibrillation: Secondary | ICD-10-CM | POA: Diagnosis not present

## 2014-10-13 DIAGNOSIS — Z950 Presence of cardiac pacemaker: Secondary | ICD-10-CM

## 2014-10-13 LAB — BASIC METABOLIC PANEL
Anion gap: 5 (ref 5–15)
BUN: 12 mg/dL (ref 6–20)
CO2: 24 mmol/L (ref 22–32)
Calcium: 8.9 mg/dL (ref 8.9–10.3)
Chloride: 108 mmol/L (ref 101–111)
Creatinine, Ser: 0.99 mg/dL (ref 0.61–1.24)
GFR calc Af Amer: >60 mL/min (ref >60)
GFR calc non Af Amer: >60 mL/min (ref >60)
Glucose, Bld: 102 mg/dL — ABNORMAL HIGH (ref 65–99)
Potassium: 4.1 mmol/L (ref 3.5–5.1)
Sodium: 137 mmol/L (ref 135–145)

## 2014-10-13 LAB — CBC
HCT: 38.2 % — ABNORMAL LOW (ref 39.0–52.0)
Hemoglobin: 13 g/dL (ref 13.0–17.0)
MCH: 33 pg (ref 26.0–34.0)
MCHC: 34 g/dL (ref 30.0–36.0)
MCV: 97 fL (ref 78.0–100.0)
Platelets: 155 10*3/uL (ref 150–400)
RBC: 3.94 MIL/uL — ABNORMAL LOW (ref 4.22–5.81)
RDW: 13.8 % (ref 11.5–15.5)
WBC: 5.3 10*3/uL (ref 4.0–10.5)

## 2014-10-13 LAB — HEPARIN LEVEL (UNFRACTIONATED): Heparin Unfractionated: 0.63 IU/mL (ref 0.30–0.70)

## 2014-10-13 MED ORDER — OFF THE BEAT BOOK
Freq: Once | Status: AC
  Administered 2014-10-13: 15:00:00
  Filled 2014-10-13: qty 1

## 2014-10-13 MED ORDER — DILTIAZEM HCL 30 MG PO TABS: 30.0000 mg | ORAL_TABLET | Freq: Two times a day (BID) | ORAL | Status: DC | PRN

## 2014-10-13 NOTE — Consult Note (Signed)
ELECTROPHYSIOLOGY CONSULT NOTE    Patient ID: Darryl White MRN: [id number], DOB/AGE: [birth year] 74 y.o.  Admit date: 10/12/2014 Date of Consult: 10/13/2014  Primary Physician: Arnette Norris, MD Primary Cardiologist: [facility]  Reason for Consultation: atrial fibrillation  HPI:  Darryl White is a 74 y.o. male with a past medical history significant for AV block s/p PPM, hyperlipidemia, orthostatic hypotension, and gastric ulcers.  He developed heart pounding on the day of admission and presented to the ER for further evaluation.  He was found to be in atrial fibrillation with atrial undersensing resulting in V pacing at max track rate.  His device was reprogrammed and he converted spontaneously to SR and his symptoms have resolved. EP has been asked to evaluate for treatment options.   Device interrogation demonstrates normal device function.  He did have atrial undersensing and his sensitivity was reprogrammed.   He has never had palpitations before this episode.  He is fairly active at home and denies chest pain or shortness of breath. He has not had recent dizziness or syncope.  No recent fevers, chills, nausea, vomiting, unexplained weight gain or weight loss.   Myoview this admission was low risk with EF >65%.  Echocardiogram has been done, results are pending.    He has moved to the [place] area and asks that his device be followed by [facility] for convenience.   Past Medical History  Diagnosis Date  . Chicken pox   . Diverticulitis   . History of stomach ulcers   . Hyperlipidemia   . Orthostatic hypotension   . 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 Paroxysmal atrial fibrillation      Surgical History:  Past Surgical History  Procedure Laterality Date  . Appendectomy    . Tonsillectomy and adenoidectomy    . Large intestine block    . Back surgery    . Pacemaker insertion      MDT dual chamber pacemaker     Prescriptions prior to  admission  Medication Sig Dispense Refill Last Dose  . aspirin 81 MG tablet Take 81 mg by mouth daily.   10/11/2014 at Unknown time  . atenolol (TENORMIN) 25 MG tablet Take 25 mg by mouth daily.   10/11/2014 at 1830  . brimonidine (ALPHAGAN) 0.15 % ophthalmic solution Place 1 drop into both eyes 2 (two) times daily.    10/11/2014 at Unknown time  . Cholecalciferol (VITAMIN D3) 2000 UNITS capsule Take 2,000 Units by mouth daily.   10/11/2014 at Unknown time  . fenofibrate (TRICOR) 145 MG tablet Take 145 mg by mouth daily.   10/11/2014 at Unknown time  . fludrocortisone (FLORINEF) 0.1 MG tablet Take 0.1 mg by mouth every other day.    10/11/2014 at Unknown time  . simvastatin (ZOCOR) 40 MG tablet take 1 tablet by mouth at bedtime 90 tablet 2 10/11/2014 at Unknown time  . traMADol (ULTRAM) 50 MG tablet take 1 tablet by mouth every 4 hours if needed (Patient taking differently: take 1 tablet by mouth every 4 hours if needed for pain.) 60 tablet 0 several months    Inpatient Medications:  . aspirin  81 mg Oral Daily  . brimonidine  1 drop Both Eyes BID  . cholecalciferol  2,000 Units Oral Daily  . fenofibrate  160 mg Oral Daily  . fludrocortisone  0.1 mg Oral QODAY  . off the beat book   Does not apply Once  .  simvastatin  40 mg Oral QHS    Allergies:  Allergies  Allergen Reactions  . Sulfa Antibiotics Hives and Itching  . Penicillins Hives and Palpitations    Social History   Social History  . Marital Status: Married    Spouse Name: N/A  . Number of Children: N/A  . Years of Education: N/A   Occupational History  . Not on file.   Social History Main Topics  . Smoking status: Current Every Day Smoker  . Smokeless tobacco: Never Used  . Alcohol Use: Yes  . Drug Use: No  . Sexual Activity: No   Other Topics Concern  . Not on file   Social History Narrative   Married.   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     Family History  Problem Relation Age of Onset  .  Cancer Mother   . Heart disease Father      Review of Systems: All other systems reviewed and are otherwise negative except as noted above.  Physical Exam: Filed Vitals:   10/13/14 0651 10/13/14 0655 10/13/14 0710 10/13/14 1408  BP:  138/76 136/72 132/78  Pulse: 80 80 80 80  Temp:    98.6 F (37 C)  TempSrc:    Oral  Resp: '17 22 12   '$ Height:      Weight:      SpO2: 96% 96% 95% 95%    GEN- The patient is elderly appearing, alert and oriented x 3 today.   HEENT: normocephalic, atraumatic; sclera clear, conjunctiva pink; hearing intact; oropharynx clear; neck supple  Lungs- Clear to ausculation bilaterally, normal work of breathing.  No wheezes, rales, rhonchi Heart- Regular rate and rhythm (paced) GI- soft, non-tender, non-distended, bowel sounds present  Extremities- no clubbing, cyanosis, or edema; DP/PT/radial pulses 2+ bilaterally MS- no significant deformity or atrophy Skin- warm and dry, no rash or lesion Psych- euthymic mood, full affect Neuro- strength and sensation are intact  Labs:   Lab Results  Component Value Date   WBC 5.3 10/13/2014   HGB 13.0 10/13/2014   HCT 38.2* 10/13/2014   MCV 97.0 10/13/2014   PLT 155 10/13/2014     Recent Labs Lab 10/12/14 1551 10/13/14 0544  NA  --  137  K  --  4.1  CL  --  108  CO2  --  24  BUN  --  12  CREATININE  --  0.99  CALCIUM  --  8.9  PROT 5.4*  --   BILITOT 0.5  --   ALKPHOS 39  --   ALT 17  --   AST 43*  --   GLUCOSE  --  102*      Radiology/Studies: Dg Chest 2 View 10/12/2014   CLINICAL DATA:  Chest tightness, shortness of breath, and tachycardia. Smoker.  EXAM: CHEST  2 VIEW  COMPARISON:  CT chest 03/04/2014  FINDINGS: Cardiac pacemaker. Normal heart size and pulmonary vascularity. Mediastinal contours appear intact. Calcified and tortuous aorta. 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 Appearance is similar to previous study. No developing consolidation. No  pneumothorax. No blunting of costophrenic angles.  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   Electronically Signed   By: Lucienne Capers M.D.   On: 10/12/2014 03:04   Nm Myocar Multi W/spect W/wall Motion / Ef 10/12/2014    There was no ST segment deviation noted during stress.  Defect 1: There is a small defect of mild severity present in  the basal  inferior location.  The study is normal.  This is a low risk study.  The left ventricular ejection fraction is hyperdynamic (>65%).     OJJ:[id number] fibrillation with atrial undersensing and v pacing at 124 bpm  TELEMETRY: AV pacing with intermittent intrinsic ventricular conduction  DEVICE HISTORY: MDT dual chamber pacemaker implanted for intermittent AV block and syncope; generator change 2008  Assessment/Plan: 1.  Paroxysmal atrial fibrillation The patient presented with symptomatic paroxysmal atrial fibrillation with atrial undersensing and V pacing at max track rate. I think the majority of his symptoms were due to rapid V pacing. His device has been reprogrammed to DDI to prevent inappropriate tracking of AF.  For now, would not add AAD therapy as this is his first episode of AF and he converted spontaneously. CHADS2VASC is 1 - anticoagulation could be considered. Would track AF burden through device and if he has increasing AF burden then discuss  2.  AV block He has a MDT dual chamber PPM implanted for AV block.  His device was interrogated this admission and functioning normally with the exception of atrial undersensing. He has been followed at [facility] but prefers to be followed with [facility] for convenience. He would like to be seen in the [place] office.  He has been doing TTM's. We Darryl White enroll in [facility] and give MyCare[employer] Smart monitor.   3.  Orthostatic hypotension Relatively asymptomatic  Continue Florinef  Dr Curt Bears to see later today Should be stable for discharge to home later this  afternoon  Signed, Darryl Marshall, NP 10/13/2014 2:48 PM  I have seen and examined this patient with Darryl White.  Agree with above, note added to reflect my findings.  On exam, heart is regular with no murmurs, lungs are clear.  Had episode of atrial fibrillation which his device tracked to its upper limit of 120.  He was symptomatic with chest pain, SOB, and palpitations.  He is in sinus rhythm now.  His risk of CVA is 0.6% per year.  In discussion with him, he has elected against anticoagulation.  He Darryl White follow up with Dr. Lequita Halt in [place].  His device was changed to DDI so that it would not track his AF.  We Loxley Cibrian discharge him on prn Cardizem.  Darryl White M. Sybil Shrader MD 10/13/2014 3:24 PM

## 2014-10-13 NOTE — Progress Notes (Signed)
  Echocardiogram 2D Echocardiogram has been performed.  Diamond Nickel 10/13/2014, 12:13 PM

## 2014-10-13 NOTE — Progress Notes (Signed)
Patient Profile: 27M with syncope/bradycardia s/p florinef and PPM, HL who presented to the ED with his first episode of afib with RVR.   Subjective: Feels significantly better. Symptoms resolved completely after spontaneous conversion to NSR. No recurrence overnight.   Objective: Vital signs in last 24 hours: Temp:  [98 F (36.7 C)-98.3 F (36.8 C)] 98.3 F (36.8 C) (08/23 0500) Pulse Rate:  [79-94] 80 (08/23 0710) Resp:  [12-27] 12 (08/23 0710) BP: (89-138)/(46-76) 136/72 mmHg (08/23 0710) SpO2:  [94 %-99 %] 95 % (08/23 0710) Weight:  [165 lb (74.844 kg)-165 lb 3.2 oz (74.934 kg)] 165 lb (74.844 kg) (08/23 0500) Last BM Date: 10/11/14  Intake/Output from previous day: 08/22 0701 - 08/23 0700 In: 411.4 [P.O.:240; I.V.:171.4] Out: 250 [Urine:250] Intake/Output this shift:    Medications Current Facility-Administered Medications  Medication Dose Route Frequency Provider Last Rate Last Dose  . acetaminophen (TYLENOL) tablet 650 mg  650 mg Oral Q4H PRN Skeet Latch, MD      . aspirin chewable tablet 81 mg  81 mg Oral Daily Skeet Latch, MD   81 mg at 10/12/14 0930  . brimonidine (ALPHAGAN) 0.2 % ophthalmic solution 1 drop  1 drop Both Eyes BID Skeet Latch, MD      . cholecalciferol (VITAMIN D) tablet 2,000 Units  2,000 Units Oral Daily Skeet Latch, MD   2,000 Units at 10/12/14 0930  . fenofibrate tablet 160 mg  160 mg Oral Daily Skeet Latch, MD   160 mg at 10/12/14 0930  . fludrocortisone (FLORINEF) tablet 0.1 mg  0.1 mg Oral QODAY Skeet Latch, MD   0.1 mg at 10/12/14 0930  . heparin ADULT infusion 100 units/mL (25000 units/250 mL)  1,200 Units/hr Intravenous Continuous April Palumbo, MD 12 mL/hr at 10/12/14 2153 1,200 Units/hr at 10/12/14 2153  . ondansetron (ZOFRAN) injection 4 mg  4 mg Intravenous Q6H PRN Skeet Latch, MD      . simvastatin (ZOCOR) tablet 40 mg  40 mg Oral QHS Skeet Latch, MD   40 mg at 10/12/14 2243  . traMADol  (ULTRAM) tablet 50 mg  50 mg Oral Q4H PRN Skeet Latch, MD      . zolpidem (AMBIEN) tablet 5 mg  5 mg Oral QHS PRN Larey Dresser, MD   5 mg at 10/12/14 2243    PE: General appearance: alert, cooperative and no distress Neck: no carotid bruit and no JVD Lungs: faint crackles over RLL that clear after deep cough, otherwise CTAB Heart: regular rate and rhythm, S1, S2 normal, no murmur, click, rub or gallop Extremities: no LEE Pulses: 2+ and symmetric Skin: warm and dry Neurologic: Grossly normal  Lab Results:   Recent Labs  10/12/14 0245 10/13/14 0544  WBC 8.4 5.3  HGB 14.2 13.0  HCT 41.2 38.2*  PLT 196 155   BMET  Recent Labs  10/12/14 0245 10/13/14 0544  NA 134* 137  K 4.1 4.1  CL 104 108  CO2 21* 24  GLUCOSE 111* 102*  BUN 21* 12  CREATININE 1.20 0.99  CALCIUM 9.1 8.9   PT/INR No results for input(s): LABPROT, INR in the last 72 hours. Cholesterol  Recent Labs  10/12/14 1551  CHOL 109   Cardiac Panel (last 3 results) No results for input(s): CKTOTAL, CKMB, TROPONINI, RELINDX in the last 72 hours.  Studies/Results: NST 10/12/14  Nuclear Stress Findings     Nuclear Study Quality Overall image quality is good.Diaphragmatic attenuation artifact was present.    Rest Perfusion  There is a defect present in the basal inferior location.    Stress Perfusion There is a defect present in the basal inferior location.    Perfusion Summary Defect 1:  There is a small defect of mild severity present in the basal inferior location. The defect is non-reversible and consistent with artifact. Probable normal perfusion and mild soft tissue attenuation (diaphragm).    Overall Study Impression Myocardial perfusion is normal. The study is normal. This is a low risk study. Overall left ventricular systolic function was normal. LV cavity size is normal. The left ventricular ejection fraction is hyperdynamic (>65%). There is no prior study for comparison.        2D echo- pending  Assessment/Plan  Active Problems:   HLD (hyperlipidemia)   History of syncope   Cardiac pacemaker   Atrial fibrillation   Hypotension    1. Atrial Fibrillation w/ RVR: 1st time episode. PPM device interrogation in ED revealed afib of <24 hrs duration. No clear precipitants: K 4.1, Mg 1.9, TSH + free T4 WNL. CXR w/o infiltrate, edema or consolidation. NST low risk for ischemia. 2D echo pending.   -successful conversion to SR on IV amiodarone in ED  - felt not to be a great candidate for rate control strategy with AV nodal blocking given issues   with hypotension. Rhythm control is likely to be his best option. NST is low risk for   ischemia. Still awaiting 2D echo to r/o structural heart disease. Will consider flecainide   vs sotalol vs propafenone.  - his CHA2DS2 VASc score is 1 (Age 92-74). No indication for long term anticoagulation.  Continue daily ASA.     2. Hypotension:  Stable on Florinef. Home atenolol was initially placed on hold. UA negative for UTI.   Brittainy M. Ladoris Gene 10/13/2014 8:02 AM  Attending Note:   The patient was seen and examined.  Agree with assessment and plan as noted above.  Changes made to the above note as needed.  Pt is doing well. Has converted out of Afib - is now a - pacing  I have asked EP to see and help [place] decide what would be the next best step. Will get an echo today . Will officially interrogate his pacer.   He would like to start seeing our group for pacer follow up   Thayer Headings, Brooke Bonito., MD, [facility] 10/13/2014, 10:41 AM [address],  [place] Pager [phone]

## 2014-10-13 NOTE — Progress Notes (Signed)
ANTICOAGULATION CONSULT NOTE - Initial Consult  Pharmacy Consult for Heparin Indication: chest pain/ACS/Afib  Allergies  Allergen Reactions  . Sulfa Antibiotics Hives and Itching  . Penicillins Hives and Palpitations    Patient Measurements: Height: '5\' 7"'$  (170.2 cm) Weight: 165 lb (74.844 kg) IBW/kg (Calculated) : 66.1 Heparin Dosing Weight: 75 kg  Vital Signs: Temp: 98.3 F (36.8 C) (08/23 0500) Temp Source: Oral (08/23 0500) BP: 136/72 mmHg (08/23 0710) Pulse Rate: 80 (08/23 0710)  Labs:  Recent Labs  10/12/14 0245 10/12/14 1551 10/13/14 0541 10/13/14 0544  HGB 14.2  --   --  13.0  HCT 41.2  --   --  38.2*  PLT 196  --   --  155  HEPARINUNFRC  --  0.56 0.63  --   CREATININE 1.20  --   --  0.99    Estimated Creatinine Clearance: 62.1 mL/min (by C-G formula based on Cr of 0.99).  Assessment: 52 yom continues on IV heparin for CP. Myoview was reported without ischemia. Heparin level remains therapeutic today. No bleeding noted. CBC is stable. Afib converted to NSR. No plans for long-term anticoagulation.   Goal of Therapy:  Heparin level 0.3-0.7 units/ml Monitor platelets by anticoagulation protocol: Yes   Plan:  - Continue heparin gtt at 1200 units/hr - Daily heparin level and CBC - F/u planned heparin LOT  Salome Arnt, PharmD, BCPS Pager # [phone] 10/13/2014 10:21 AM

## 2014-10-13 NOTE — Discharge Instructions (Signed)

## 2014-10-13 NOTE — Discharge Summary (Signed)
ELECTROPHYSIOLOGY PROCEDURE DISCHARGE SUMMARY    Patient ID: Darryl White,  MRN: [id number], DOB/AGE: May 06, 1940 74 y.o.  Admit date: 10/12/2014 Discharge date: 10/13/2014  Primary Care Physician: Arnette Norris, MD Primary Cardiologist: [facility], wants to establish with [facility] for convenience   Primary Discharge Diagnosis:  1.  Paroxysmal atrial fibrillation  Secondary Discharge Diagnosis:  1.  Orthostatic hypotension 2.  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  Hyperlipidemia  Allergies  Allergen Reactions  . Sulfa Antibiotics Hives and Itching  . Penicillins Hives and Palpitations    Brief HPI/Hospital Course:  Darryl White is a 74 y.o. male with a past medical history as outlined above. On the day of admission, he developed palpitations and chest pressure and presented to the ER for further evaluation.  He was found to have atrial fibrillation with atrial undersensing on his device and V pacing at 120bpm.  His device was reprogrammed and he spontaneously converted to SR. He underwent myoview and echocardiogram which were both low risk.  He was seen by Dr Curt Bears who recommended watchful waiting for now.  We Darryl White follow his AF burden remotely through his device and if he has increased AF Darryl White need to consider AAD therapy.    CHADS2VASC is 1, Dr Curt Bears discussed anticoagulation with the patient who would like to hold off for now with low stroke risk.    He wishes to establish care with Dr Caryl Comes in the [place] office instead of being seen at [facility] for convenience reasons. Darryl White schedule follow up with Dr Caryl Comes and enroll in our [facility] clinic with [facility].   Darryl White give Cardizem '30mg'$  bid prn to take for palpitations.   Physical Exam: Filed Vitals:   10/13/14 0651 10/13/14 0655 10/13/14 0710 10/13/14 1408  BP:  138/76 136/72 132/78  Pulse: 80 80 80 80  Temp:    98.6 F (37 C)  TempSrc:    Oral  Resp: '17 22 12   '$ Height:       Weight:      SpO2: 96% 96% 95% 95%    Labs:   Lab Results  Component Value Date   WBC 5.3 10/13/2014   HGB 13.0 10/13/2014   HCT 38.2* 10/13/2014   MCV 97.0 10/13/2014   PLT 155 10/13/2014     Recent Labs Lab 10/12/14 1551 10/13/14 0544  NA  --  137  K  --  4.1  CL  --  108  CO2  --  24  BUN  --  12  CREATININE  --  0.99  CALCIUM  --  8.9  PROT 5.4*  --   BILITOT 0.5  --   ALKPHOS 39  --   ALT 17  --   AST 43*  --   GLUCOSE  --  102*     Discharge Medications:    Medication List    TAKE these medications        aspirin 81 MG tablet  Take 81 mg by mouth daily.     atenolol 25 MG tablet  Commonly known as:  TENORMIN  Take 25 mg by mouth daily.     brimonidine 0.15 % ophthalmic solution  Commonly known as:  ALPHAGAN  Place 1 drop into both eyes 2 (two) times daily.     diltiazem 30 MG tablet  Commonly known as:  CARDIZEM  Take 1 tablet (30 mg total) by mouth 2 (two) times daily as needed.  fenofibrate 145 MG tablet  Commonly known as:  TRICOR  Take 145 mg by mouth daily.     fludrocortisone 0.1 MG tablet  Commonly known as:  FLORINEF  Take 0.1 mg by mouth every other day.     simvastatin 40 MG tablet  Commonly known as:  ZOCOR  take 1 tablet by mouth at bedtime     traMADol 50 MG tablet  Commonly known as:  ULTRAM  take 1 tablet by mouth every 4 hours if needed     Vitamin D3 2000 UNITS capsule  Take 2,000 Units by mouth daily.        Disposition:   Follow-up Information    Follow up with Virl Axe, MD On 10/27/2014.   Specialty:  Cardiology   Why:  at 12noon   Contact information:   [place] [address] [phone]       Duration of Discharge Encounter: Greater than 30 minutes including physician time.  Signed, Chanetta Marshall, NP 10/13/2014 3:42 PM   I have seen and examined this patient with Chanetta Marshall.  Agree with above, note added to reflect my findings.  On exam, heart  was regular, no murmurs, no wheezing.  Had atrial fibrillation with palpitations, SOB and CP.  CHADS2VASc of 1, elected against anticoagulation.  Evett Kassa follow up in [place].    Farren Landa M. Challen Spainhour MD 10/15/2014 11:43 AM

## 2014-10-14 LAB — NM MYOCAR MULTI W/SPECT W/WALL MOTION / EF
Estimated workload: 1 METS
Exercise duration (min): 0 min
Exercise duration (sec): 0 s
MPHR: 147 {beats}/min
Peak HR: 108 {beats}/min
Percent HR: 0 %
RPE: 0
Rest HR: 85 {beats}/min

## 2014-10-15 ENCOUNTER — Other Ambulatory Visit: Payer: Self-pay | Admitting: Family Medicine

## 2014-10-15 DIAGNOSIS — Z0000 Encounter for general adult medical examination without abnormal findings: Secondary | ICD-10-CM | POA: Insufficient documentation

## 2014-10-15 DIAGNOSIS — E781 Pure hyperglyceridemia: Secondary | ICD-10-CM

## 2014-10-15 DIAGNOSIS — E785 Hyperlipidemia, unspecified: Secondary | ICD-10-CM

## 2014-10-20 ENCOUNTER — Other Ambulatory Visit (INDEPENDENT_AMBULATORY_CARE_PROVIDER_SITE_OTHER): Payer: Medicare PPO

## 2014-10-20 DIAGNOSIS — E781 Pure hyperglyceridemia: Secondary | ICD-10-CM

## 2014-10-20 DIAGNOSIS — Z125 Encounter for screening for malignant neoplasm of prostate: Secondary | ICD-10-CM | POA: Diagnosis not present

## 2014-10-20 DIAGNOSIS — E785 Hyperlipidemia, unspecified: Secondary | ICD-10-CM | POA: Diagnosis not present

## 2014-10-20 DIAGNOSIS — Z0000 Encounter for general adult medical examination without abnormal findings: Secondary | ICD-10-CM

## 2014-10-20 LAB — COMPREHENSIVE METABOLIC PANEL
ALT: 18 U/L (ref 0–53)
AST: 28 U/L (ref 0–37)
Albumin: 4.1 g/dL (ref 3.5–5.2)
Alkaline Phosphatase: 47 U/L (ref 39–117)
BUN: 19 mg/dL (ref 6–23)
CO2: 28 mEq/L (ref 19–32)
Calcium: 9.1 mg/dL (ref 8.4–10.5)
Chloride: 102 mEq/L (ref 96–112)
Creatinine, Ser: 1.19 mg/dL (ref 0.40–1.50)
GFR: 63.55 mL/min (ref >60.00)
Glucose, Bld: 90 mg/dL (ref 70–99)
Potassium: 4.5 mEq/L (ref 3.5–5.1)
Sodium: 134 mEq/L — ABNORMAL LOW (ref 135–145)
Total Bilirubin: 0.7 mg/dL (ref 0.2–1.2)
Total Protein: 6.5 g/dL (ref 6.0–8.3)

## 2014-10-20 LAB — LIPID PANEL
Cholesterol: 120 mg/dL (ref 0–200)
HDL: 46.6 mg/dL (ref >39.00)
LDL Cholesterol: 56 mg/dL (ref 0–99)
NonHDL: 73.46
Total CHOL/HDL Ratio: 3
Triglycerides: 87 mg/dL (ref 0.0–149.0)
VLDL: 17.4 mg/dL (ref 0.0–40.0)

## 2014-10-20 LAB — CBC WITH DIFFERENTIAL/PLATELET
Basophils Absolute: 0 10*3/uL (ref 0.0–0.1)
Basophils Relative: 0.6 % (ref 0.0–3.0)
Eosinophils Absolute: 0.4 10*3/uL (ref 0.0–0.7)
Eosinophils Relative: 5.2 % — ABNORMAL HIGH (ref 0.0–5.0)
HCT: 43.2 % (ref 39.0–52.0)
Hemoglobin: 14.7 g/dL (ref 13.0–17.0)
Lymphocytes Relative: 31.5 % (ref 12.0–46.0)
Lymphs Abs: 2.1 10*3/uL (ref 0.7–4.0)
MCHC: 34.1 g/dL (ref 30.0–36.0)
MCV: 99 fl (ref 78.0–100.0)
Monocytes Absolute: 0.6 10*3/uL (ref 0.1–1.0)
Monocytes Relative: 8.5 % (ref 3.0–12.0)
Neutro Abs: 3.7 10*3/uL (ref 1.4–7.7)
Neutrophils Relative %: 54.2 % (ref 43.0–77.0)
Platelets: 233 10*3/uL (ref 150.0–400.0)
RBC: 4.36 Mil/uL (ref 4.22–5.81)
RDW: 14.4 % (ref 11.5–15.5)
WBC: 6.8 10*3/uL (ref 4.0–10.5)

## 2014-10-20 LAB — PSA, MEDICARE: PSA: 0.37 ng/ml (ref 0.10–4.00)

## 2014-10-21 ENCOUNTER — Encounter: Payer: Medicare PPO | Admitting: Family Medicine

## 2014-10-21 ENCOUNTER — Encounter: Payer: Self-pay | Admitting: Family Medicine

## 2014-10-21 ENCOUNTER — Ambulatory Visit (INDEPENDENT_AMBULATORY_CARE_PROVIDER_SITE_OTHER): Payer: Medicare PPO | Admitting: Family Medicine

## 2014-10-21 VITALS — BP 126/74 | HR 80 | Temp 98.3°F | Ht 66.25 in | Wt 164.2 lb

## 2014-10-21 DIAGNOSIS — Z720 Tobacco use: Secondary | ICD-10-CM

## 2014-10-21 DIAGNOSIS — N401 Enlarged prostate with lower urinary tract symptoms: Secondary | ICD-10-CM

## 2014-10-21 DIAGNOSIS — R351 Nocturia: Secondary | ICD-10-CM | POA: Diagnosis not present

## 2014-10-21 DIAGNOSIS — I480 Paroxysmal atrial fibrillation: Secondary | ICD-10-CM

## 2014-10-21 DIAGNOSIS — Z86010 Personal history of colonic polyps: Secondary | ICD-10-CM

## 2014-10-21 DIAGNOSIS — E785 Hyperlipidemia, unspecified: Secondary | ICD-10-CM | POA: Diagnosis not present

## 2014-10-21 DIAGNOSIS — Z0000 Encounter for general adult medical examination without abnormal findings: Secondary | ICD-10-CM

## 2014-10-21 DIAGNOSIS — Z950 Presence of cardiac pacemaker: Secondary | ICD-10-CM

## 2014-10-21 MED ORDER — TRAMADOL HCL 50 MG PO TABS: ORAL_TABLET | ORAL | Status: DC

## 2014-10-21 MED ORDER — ATENOLOL 25 MG PO TABS: 25.0000 mg | ORAL_TABLET | Freq: Every day | ORAL | Status: DC

## 2014-10-21 MED ORDER — SIMVASTATIN 40 MG PO TABS: 40.0000 mg | ORAL_TABLET | Freq: Every day | ORAL | Status: DC

## 2014-10-21 NOTE — Assessment & Plan Note (Signed)
Currently NSR. On beta blocker and prn cardizem. anticoagulation deferred by cardiology due to low CHADs2.  Has appt with Dr.Klein scheduled for next week.

## 2014-10-21 NOTE — Progress Notes (Signed)
Subjective:   Patient ID: Darryl White, male    DOB: 1940/06/09, 74 y.o.   MRN: [id number]  Darryl White is a pleasant 74 y.o. year old male who presents to clinic today with Annual Exam and Hospitalization Follow-up  on 10/21/2014  HPI:  I have personally reviewed the Medicare Annual Wellness questionnaire and have noted 1. The patient's medical and social history 2. Their use of alcohol, tobacco or illicit drugs 3. Their current medications and supplements 4. The patient's functional ability including ADL's, fall risks, home safety risks and hearing or visual             impairment. 5. Diet and physical activities 6. Evidence for depression or mood disorders  End of life wishes discussed and updated in Social History.  The roster of all physicians providing medical care to patient - is listed in the CareTeams section of the chart.     Colonoscopy 04/27/2011 Prevnar 13 01/30/2014 Dental cleaning/exam 09/2014 Eye exam 12/2013 Zostavax received at a pharmacy in [place]  Admitted to [facility] on 8/22 for CP and palpitations.  Note reviewed. Found to be in afib with RVR.  His pacer was reprogrammed and he converted to SR.   Myoview and echo were low risk.  Saw Dr. Curt Bears who recommended watchful waiting and deferred anitcoagulation since his CHADS2 is 1.  He is going to establish with [name]- app scheduled for 9/6.  Was d/c'd home on cardizem 30 mg twice daily prn and atenolol 25 mg daily.  Has not had to take any cardizem.  Has had no recurrent palpitations or chest pressure.  HLD- has been well controlled on Tricor and zocor. Lab Results  Component Value Date   CHOL 120 10/20/2014   HDL 46.60 10/20/2014   LDLCALC 56 10/20/2014   TRIG 87.0 10/20/2014   CHOLHDL 3 10/20/2014   Tobacco abuse- > 35 year pack history. Still smoking but feels he would consider quitting in future. Mom died of lung CA.   Neg low dose screening chest CT in 02/2014. Lab Results  Component  Value Date   ALT 18 10/20/2014   AST 28 10/20/2014   ALKPHOS 47 10/20/2014   BILITOT 0.7 10/20/2014   Lab Results  Component Value Date   CREATININE 1.19 10/20/2014   Lab Results  Component Value Date   NA 134* 10/20/2014   K 4.5 10/20/2014   CL 102 10/20/2014   CO2 28 10/20/2014   Lab Results  Component Value Date   PSA 0.37 10/20/2014   PSA 0.38 01/27/2014   Current Outpatient Prescriptions on File Prior to Visit  Medication Sig Dispense Refill  . aspirin 81 MG tablet Take 81 mg by mouth daily.    Marland Kitchen atenolol (TENORMIN) 25 MG tablet Take 25 mg by mouth daily.    . brimonidine (ALPHAGAN) 0.15 % ophthalmic solution Place 1 drop into both eyes 2 (two) times daily.     . Cholecalciferol (VITAMIN D3) 2000 UNITS capsule Take 2,000 Units by mouth daily.    Marland Kitchen diltiazem (CARDIZEM) 30 MG tablet Take 1 tablet (30 mg total) by mouth 2 (two) times daily as needed. 60 tablet 11  . fenofibrate (TRICOR) 145 MG tablet Take 145 mg by mouth daily.    . fludrocortisone (FLORINEF) 0.1 MG tablet Take 0.1 mg by mouth every other day.     . simvastatin (ZOCOR) 40 MG tablet take 1 tablet by mouth at bedtime 90 tablet 2  . traMADol (ULTRAM) 50 MG tablet  take 1 tablet by mouth every 4 hours if needed (Patient taking differently: take 1 tablet by mouth every 4 hours if needed for pain.) 60 tablet 0   No current facility-administered medications on file prior to visit.    Allergies  Allergen Reactions  . Sulfa Antibiotics Hives and Itching  . Penicillins Hives and Palpitations    Past Medical History  Diagnosis Date  . Chicken pox   . Diverticulitis   . History of stomach ulcers   . Hyperlipidemia   . Orthostatic hypotension   . 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 Paroxysmal atrial fibrillation     Past Surgical History  Procedure Laterality Date  . Appendectomy    . Tonsillectomy and adenoidectomy    . Large intestine block    . Back surgery    . Pacemaker  insertion      MDT dual chamber pacemaker    Family History  Problem Relation Age of Onset  . Cancer Mother   . Heart disease Father     Social History   Social History  . Marital Status: Married    Spouse Name: N/A  . Number of Children: N/A  . Years of Education: N/A   Occupational History  . Not on file.   Social History Main Topics  . Smoking status: Current Every Day Smoker  . Smokeless tobacco: Never Used  . Alcohol Use: Yes  . Drug Use: No  . Sexual Activity: No   Other Topics Concern  . Not on file   Social History Narrative   Married.   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   The PMH, [place], Social History, Family History, Medications, and allergies have been reviewed in [facility], and have been updated if relevant.     Review of Systems  Constitutional: Negative.   HENT: Negative.   Eyes: Negative.   Respiratory: Negative.   Cardiovascular: Negative.   Gastrointestinal: Negative.   Endocrine: Negative.   Genitourinary: Negative.   Musculoskeletal: Negative.   Skin: Negative.   Allergic/Immunologic: Negative.   Neurological: Negative.   Hematological: Negative.   Psychiatric/Behavioral: Negative.   All other systems reviewed and are negative.      Objective:    BP 126/74 mmHg  Pulse 80  Temp(Src) 98.3 F (36.8 C) (Oral)  Ht 5' 6.25" (1.683 m)  Wt 164 lb 4 oz (74.503 kg)  BMI 26.30 kg/m2  SpO2 98%   Physical Exam  Constitutional: He is oriented to person, place, and time. He appears well-developed and well-nourished. No distress.  HENT:  Head: Normocephalic and atraumatic.  Eyes: Conjunctivae are normal.  Neck: Normal range of motion.  Cardiovascular: Normal rate and regular rhythm.   Pulmonary/Chest: Effort normal. No respiratory distress. He has no wheezes. He has no rales. He exhibits no tenderness.  Musculoskeletal: He exhibits no edema.  Neurological: He is alert and oriented to person, place, and time. No cranial nerve  deficit.  Skin: Skin is warm and dry.  Psychiatric: He has a normal mood and affect. His behavior is normal. Judgment and thought content normal.  Nursing note and vitals reviewed.         Assessment & Plan:   Medicare annual wellness visit, subsequent No Follow-up on file.

## 2014-10-21 NOTE — Addendum Note (Signed)
Addended by: Modena Nunnery on: 10/21/2014 09:12 AM   Modules accepted: Orders

## 2014-10-21 NOTE — Assessment & Plan Note (Signed)
Smoking cessation instruction/counseling given:  commended patient for quitting and reviewed strategies for preventing relapses  He remains unready to quit at this time.  Low dose screening chest CT up to date- due again 02/2015.

## 2014-10-21 NOTE — Assessment & Plan Note (Signed)
The patients weight, height, BMI and visual acuity have been recorded in the chart I have made referrals, counseling and provided education to the patient based review of the above and I have provided the pt with a written personalized care plan for preventive services.  Influenza vaccine given today.

## 2014-10-21 NOTE — Assessment & Plan Note (Signed)
Well controlled on current rxs. No changes made today. 

## 2014-10-25 NOTE — Progress Notes (Signed)
Patient Care Team: Lucille Passy, MD as PCP - General (Family Medicine) Will Meredith Leeds, MD as Consulting Physician (Cardiology)   HPI  Darryl White is a 74 y.o. male Seen to establish followup.  Prev seen at [facility] for  PM and OI associated with syncope  New episode AFib>> [facility] 8/16 and converted spontanously   He had atrial undersensing resulting in failure to mode switch and tracking at the upper rate limit  Discharged on ASA with CHADSVASc 1 ( age )  He has had no intercurrent tachypalpitations.  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  He ahs hx of PM implanted initially 20 years ago for syncope. He has had no recurrent syncope.   Records and Results Reviewed  ; hospital records, above imaging studies Past Medical History  Diagnosis Date  . Chicken pox   . Diverticulitis   . History of stomach ulcers   . Hyperlipidemia   . Orthostatic hypotension   . 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 Paroxysmal atrial fibrillation     Past Surgical History  Procedure Laterality Date  . Appendectomy    . Tonsillectomy and adenoidectomy    . Large intestine block    . Back surgery    . Pacemaker insertion      MDT dual chamber pacemaker    Current Outpatient Prescriptions  Medication Sig Dispense Refill  . brimonidine (ALPHAGAN) 0.15 % ophthalmic solution Place 1 drop into both eyes 2 (two) times daily.     . Cholecalciferol (VITAMIN D3) 2000 UNITS capsule Take 2,000 Units by mouth daily.    Marland Kitchen diltiazem (CARDIZEM) 30 MG tablet Take 1 tablet (30 mg total) by mouth 2 (two) times daily as needed. 60 tablet 11  . fenofibrate (TRICOR) 145 MG tablet Take 145 mg by mouth daily.    . simvastatin (ZOCOR) 40 MG tablet Take 1 tablet (40 mg total) by mouth at bedtime. 90 tablet 1   No current facility-administered medications for this visit.    Allergies  Allergen Reactions   . Sulfa Antibiotics Hives and Itching  . Penicillins Hives and Palpitations      Review of Systems negative except from HPI and PMH  Physical Exam BP 118/74 mmHg  Ht '5\' 6"'$  (1.676 m)  Wt 162 lb 4 oz (73.596 kg)  BMI 26.20 kg/m2 Well developed and well nourished in no acute distress HENT normal E scleral and icterus clear Neck Supple JVP flat; carotids brisk and full Clear to ausculation Device pocket well healed; without hematoma or erythema.  There is no tethering Regular rate and rhythm, no murmurs gallops or rub Soft with active bowel sounds No clubbing cyanosis  Edema Alert and oriented, grossly normal motor and sensory function Skin Warm and Dry    Assessment and  Plan  Syncope  Labile hypertension and orthostatic hypotension  Pacemaker-[employer]  Atrial fibrillation-paroxysmal  CHADS-VASc score-1  The patient's pacemaker is functioning normally. He has had no recurrent syncope.  The patient has paroxysmal atrial fibrillation. I am in disagreement with the prior recommendations as to anticoagulation. I would favor the use of an anticoagulative as opposed to aspirin. Age is the dominant risk factor. It is a linear risk factor; hence, as he is almost 75 he almost has to normal points. I have reviewed this with him. We  have reviewed AVERROES end we will discontinue his aspirin and start him on apixaban  most recent creatinine was 1.19  In addition, I'm not sure that I understand the medical regime for his orthostatic hypotension. He has a blood pressures in the 150/110 range. We will thus discontinue his Florinef which is likely associated with significant supine hypertension. We will plan to use ProAmatine as necessary with most of his symptoms are currently in the afternoon, I suggested that, following the discontinuation of Florinef and atenolol, if he continues with symptoms, that we would use an abdominal binder initially.  The use of nonpharmacological therapy  minimizes the likelihood of side effects.Marland Kitchen

## 2014-10-27 ENCOUNTER — Ambulatory Visit (INDEPENDENT_AMBULATORY_CARE_PROVIDER_SITE_OTHER): Payer: Medicare PPO | Admitting: Internal Medicine

## 2014-10-27 ENCOUNTER — Encounter: Payer: Self-pay | Admitting: Internal Medicine

## 2014-10-27 VITALS — BP 118/74 | Ht 66.0 in | Wt 162.2 lb

## 2014-10-27 DIAGNOSIS — R55 Syncope and collapse: Secondary | ICD-10-CM | POA: Diagnosis not present

## 2014-10-27 DIAGNOSIS — I480 Paroxysmal atrial fibrillation: Secondary | ICD-10-CM

## 2014-10-27 DIAGNOSIS — Z950 Presence of cardiac pacemaker: Secondary | ICD-10-CM

## 2014-10-27 DIAGNOSIS — I950 Idiopathic hypotension: Secondary | ICD-10-CM

## 2014-10-27 DIAGNOSIS — H40003 Preglaucoma, unspecified, bilateral: Secondary | ICD-10-CM | POA: Diagnosis not present

## 2014-10-27 MED ORDER — APIXABAN 5 MG PO TABS
5.0000 mg | ORAL_TABLET | Freq: Two times a day (BID) | ORAL | Status: DC
Start: 2014-10-27 — End: 2014-12-24

## 2014-10-27 NOTE — Patient Instructions (Signed)
Medication Instructions:  Your physician has recommended you make the following change in your medication:  STOP taking aspirin STOP taking florinef STOP taking atenolol START taking Eliquis '5mg'$  twice per day    Labwork: none  Testing/Procedures: none  Follow-Up: Your physician recommends that you schedule a follow-up appointment in: three months with [name]   Any Other Special Instructions Will Be Listed Below (If Applicable).

## 2014-11-23 ENCOUNTER — Telehealth: Payer: Self-pay | Admitting: Internal Medicine

## 2014-11-23 ENCOUNTER — Encounter: Payer: Self-pay | Admitting: Cardiovascular Disease

## 2014-11-23 NOTE — Telephone Encounter (Signed)
New message      Calling because he said at last visit in [place], the pacemaker rep said they would send him a schedule of his pacemaker checks. He has not received the schedule.  Please call

## 2014-11-23 NOTE — Telephone Encounter (Signed)
Informed pt that he will get his first transmission date when he see's MD in December and then we will send letters after that. Pt verbalized understanding.

## 2014-11-27 ENCOUNTER — Other Ambulatory Visit: Payer: Self-pay | Admitting: Family Medicine

## 2014-12-18 ENCOUNTER — Other Ambulatory Visit: Payer: Self-pay | Admitting: Family Medicine

## 2014-12-18 ENCOUNTER — Telehealth: Payer: Self-pay | Admitting: *Deleted

## 2014-12-18 NOTE — Telephone Encounter (Signed)
Pt c/o medication issue:  1. Name of Medication: (ELIQUIS) 5 MG TABS tablet   2. How are you currently taking this medication (dosage and times per day)? Twice a day  3. Are you having a reaction (difficulty breathing--STAT)? no  4. What is your medication issue? Severe pain in shoulders and pain goes down to wrist. Joint pain.  Please call patient.

## 2014-12-18 NOTE — Telephone Encounter (Signed)
Left message on machine for patient to contact the office.   

## 2014-12-21 NOTE — Telephone Encounter (Signed)
S/w pt who states ASA d/c at 9/6 OV and Eliquis '5mg'$  BID added. Few weeks later, he began to develop shoulder pain. 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 Added heat and tylenol, symptoms resolved. Shortly after, began to experience similar symptoms in other shoulder, down his arm, into wrist.  He now reports symptoms in both shoulders which has not resolved. States he has now had insomnia x 4days. He reports being agitated and can't get comfortable. No other meds have been added therefore, he feels it could be related to Eliquis. Requested an appt w/[name] to get "checked out". Suggested to pt that I would forward info to him for advice. Pt agreeable w/plan and requests CB on cell, [phone].

## 2014-12-24 ENCOUNTER — Telehealth: Payer: Self-pay

## 2014-12-24 ENCOUNTER — Other Ambulatory Visit: Payer: Self-pay

## 2014-12-24 MED ORDER — APIXABAN 5 MG PO TABS: 5.0000 mg | ORAL_TABLET | Freq: Two times a day (BID) | ORAL | Status: DC

## 2014-12-24 NOTE — Telephone Encounter (Signed)
Per verbal from [name] regarding pt symptoms: If pt has never had a stroke, he may stop Eliquis for two weeks. If he is asymptomatic after that time, Eliquis can be changed.  S/w pt regarding MD recommendations. Pt agreeable w/plan and states he will call us in two weeks to report on symptoms. Pt had no further questions.

## 2014-12-25 NOTE — Telephone Encounter (Signed)
apixoban can do all this mess So he should stop it and let us know in about a week how he is doing and then we should start on alternative noac

## 2014-12-28 NOTE — Telephone Encounter (Signed)
Reviewed recommendations w/pt who verbalized understanding and is agreeable w/plan. He states he will call us next week to report whether symptoms have resolved.

## 2015-01-04 NOTE — Telephone Encounter (Signed)
Pt called to state he stopped eliquis as directed but continues to have shoulder pain.  He does not think it is due to the eliquis and states he would like to restart it. He will have the should pain evaluated by PCP and report if it iworsens once he restarts medication.  Confirmed dosage and refills.

## 2015-01-26 ENCOUNTER — Encounter: Payer: Self-pay | Admitting: Internal Medicine

## 2015-01-26 ENCOUNTER — Ambulatory Visit (INDEPENDENT_AMBULATORY_CARE_PROVIDER_SITE_OTHER): Payer: Medicare PPO | Admitting: Internal Medicine

## 2015-01-26 VITALS — BP 100/64 | HR 80 | Ht 66.0 in | Wt 161.2 lb

## 2015-01-26 DIAGNOSIS — I480 Paroxysmal atrial fibrillation: Secondary | ICD-10-CM | POA: Diagnosis not present

## 2015-01-26 NOTE — Progress Notes (Signed)
Patient Care Team: Lucille Passy, MD as PCP - General (Family Medicine) Will Meredith Leeds, MD as Consulting Physician (Cardiology)   HPI  Darryl White is a 74 y.o. male Seen to establish followup.  Prev seen at [facility] for  PM and OI associated with syncope  New episode AFib>> [facility] 8/16 and converted spontanously   He had atrial undersensing resulting in failure to mode switch and tracking at the upper rate limit  Discharged on ASA with CHADSVASc 1 ( age ) when we saw him initially 9/16, we started him on it NOAC. It was his concern that it was contributing to shoulder pain. He is held for a couple of weeks and is coming back today.  Shoulder pain had resolved and he resumed his apixaban. 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   He has had no intercurrent tachypalpitations.  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  He has hx of PM implanted initially 20 years ago for syncope. He has had no recurrent syncope.  This was presumably neurally mediated by the family's description. His device is been programmed DDI at 80 because of the atrial fibrillation issues outlined above.   Records and Results Reviewed  ; hospital records, above imaging studies Past Medical History  Diagnosis Date  . Chicken pox   . Diverticulitis   . History of stomach ulcers   . Hyperlipidemia   . Orthostatic hypotension   . 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 Paroxysmal atrial fibrillation [facility])     Past Surgical History  Procedure Laterality Date  . Appendectomy    . Tonsillectomy and adenoidectomy    . Large intestine block    . Back surgery    . Pacemaker insertion      MDT dual chamber pacemaker    Current Outpatient Prescriptions  Medication Sig Dispense Refill  . apixaban (ELIQUIS) 5 MG TABS tablet Take 1 tablet (5 mg total) by mouth 2 (two) times daily. 60 tablet 3  .  brimonidine (ALPHAGAN) 0.15 % ophthalmic solution Place 1 drop into both eyes 2 (two) times daily.     . Cholecalciferol (VITAMIN D3) 2000 UNITS capsule Take 2,000 Units by mouth daily.    Marland Kitchen diltiazem (CARDIZEM) 30 MG tablet Take 1 tablet (30 mg total) by mouth 2 (two) times daily as needed. 60 tablet 11  . fenofibrate (TRICOR) 145 MG tablet take 1 tablet by mouth once daily 90 tablet 2  . simvastatin (ZOCOR) 40 MG tablet Take 1 tablet (40 mg total) by mouth at bedtime. 90 tablet 1   No current facility-administered medications for this visit.    Allergies  Allergen Reactions  . Sulfa Antibiotics Hives and Itching  . Penicillins Hives and Palpitations      Review of Systems negative except from HPI and PMH  Physical Exam BP 100/64 mmHg  Pulse 80  Ht '5\' 6"'$  (1.676 m)  Wt 161 lb 4 oz (73.143 kg)  BMI 26.04 kg/m2 Well developed and well nourished in no acute distress HENT normal E scleral and icterus clear Neck Supple JVP flat; carotids brisk and full Clear to ausculation Device pocket well healed; without hematoma or erythema.  There is no tethering Regular rate and rhythm, no murmurs gallops or rub Soft with active bowel sounds No clubbing cyanosis  Edema Alert and oriented, grossly normal motor and sensory function Skin Warm and Dry  ECG demonstrates atrial pacing at 80 Intervals 20/08/34 with PACs  Assessment and  Plan  Syncope-neurally mediated  Labile hypertension and orthostatic hypotension  Pacemaker-Medtronic  Atrial fibrillation-paroxysmal  CHADS-VASc score-1  The patient's pacemaker is functioning normally. He has had no recurrent syncope.   He is on apixaban and tolerated without bleeding issues  We have reprogrammed his device to a lower rate limit of 50; we will walk him in the office to make sure that there is evidence of rate response (a sensing currently 17.2%)  Heart rate excursion the office was limited. We elected to change his programming  DDI--DDIR 70/120

## 2015-01-26 NOTE — Patient Instructions (Signed)
Medication Instructions: - no changes  Labwork: - none  Procedures/Testing: - none  Follow-Up: - Your physician wants you to follow-up in: 6 months with [name]. You will receive a reminder letter in the mail two months in advance. If you don't receive a letter, please call our office to schedule the follow-up appointment.  Any Additional Special Instructions Will Be Listed Below (If Applicable). - none

## 2015-01-27 LAB — CUP PACEART INCLINIC DEVICE CHECK
Date Time Interrogation Session: 20161207091307
Implantable Lead Implant Date: 19941110
Implantable Lead Implant Date: 19941110
Implantable Lead Location: 753859
Implantable Lead Location: 753860
Implantable Lead Model: 5034
Implantable Lead Model: 5534
Lead Channel Setting Pacing Amplitude: 2 V
Lead Channel Setting Pacing Amplitude: 2 V
Lead Channel Setting Pacing Pulse Width: 0.4 ms

## 2015-02-02 ENCOUNTER — Telehealth: Payer: Self-pay

## 2015-02-02 ENCOUNTER — Other Ambulatory Visit: Payer: Self-pay

## 2015-02-02 MED ORDER — APIXABAN 5 MG PO TABS: 5.0000 mg | ORAL_TABLET | Freq: Two times a day (BID) | ORAL | Status: DC

## 2015-02-02 NOTE — Telephone Encounter (Signed)
This encounter was created in error - please disregard.

## 2015-02-02 NOTE — Telephone Encounter (Signed)
Returned pt's call- explained that his pacemaker has a lower rate of 70bpm and will keep his HR from going above 70bpm. Pt verbalized understanding and is appreciative of call.  He is also concerned about his eliquis being sent into [facility] on [address]-- he has spoken to [employer] about this. I will send a message to [facility] regarding eliquis rx. Pt appreciative.

## 2015-02-02 NOTE — Telephone Encounter (Signed)
Informed pt Eliquis refill has been sent to [facility] in [place]. Pt appreciative of the call and verbalized understanding.

## 2015-02-02 NOTE — Telephone Encounter (Signed)
Pt states his pacemaker is set at 70, states "it hasnt been close to 70" States his BP was 147/90 is and his HR is 89. Please call.

## 2015-02-03 ENCOUNTER — Other Ambulatory Visit: Payer: Self-pay | Admitting: *Deleted

## 2015-02-03 MED ORDER — APIXABAN 5 MG PO TABS: 5.0000 mg | ORAL_TABLET | Freq: Two times a day (BID) | ORAL | Status: DC

## 2015-02-08 ENCOUNTER — Encounter: Payer: Self-pay | Admitting: Family Medicine

## 2015-02-08 ENCOUNTER — Ambulatory Visit (INDEPENDENT_AMBULATORY_CARE_PROVIDER_SITE_OTHER): Payer: Medicare PPO | Admitting: Family Medicine

## 2015-02-08 VITALS — BP 130/78 | HR 87 | Temp 98.3°F | Wt 172.5 lb

## 2015-02-08 DIAGNOSIS — E785 Hyperlipidemia, unspecified: Secondary | ICD-10-CM | POA: Diagnosis not present

## 2015-02-08 DIAGNOSIS — L03012 Cellulitis of left finger: Secondary | ICD-10-CM

## 2015-02-08 DIAGNOSIS — IMO0001 Reserved for inherently not codable concepts without codable children: Secondary | ICD-10-CM

## 2015-02-08 LAB — LIPID PANEL
Cholesterol: 134 mg/dL (ref 0–200)
HDL: 54.6 mg/dL (ref >39.00)
LDL Cholesterol: 62 mg/dL (ref 0–99)
NonHDL: 78.91
Total CHOL/HDL Ratio: 2
Triglycerides: 84 mg/dL (ref 0.0–149.0)
VLDL: 16.8 mg/dL (ref 0.0–40.0)

## 2015-02-08 LAB — CBC WITH DIFFERENTIAL/PLATELET
Basophils Absolute: 0 10*3/uL (ref 0.0–0.1)
Basophils Relative: 0.7 % (ref 0.0–3.0)
Eosinophils Absolute: 0.2 10*3/uL (ref 0.0–0.7)
Eosinophils Relative: 3.1 % (ref 0.0–5.0)
HCT: 42.9 % (ref 39.0–52.0)
Hemoglobin: 14.4 g/dL (ref 13.0–17.0)
Lymphocytes Relative: 29.5 % (ref 12.0–46.0)
Lymphs Abs: 2 10*3/uL (ref 0.7–4.0)
MCHC: 33.6 g/dL (ref 30.0–36.0)
MCV: 99.7 fl (ref 78.0–100.0)
Monocytes Absolute: 0.6 10*3/uL (ref 0.1–1.0)
Monocytes Relative: 9.3 % (ref 3.0–12.0)
Neutro Abs: 4 10*3/uL (ref 1.4–7.7)
Neutrophils Relative %: 57.4 % (ref 43.0–77.0)
Platelets: 225 10*3/uL (ref 150.0–400.0)
RBC: 4.3 Mil/uL (ref 4.22–5.81)
RDW: 14.5 % (ref 11.5–15.5)
WBC: 6.9 10*3/uL (ref 4.0–10.5)

## 2015-02-08 MED ORDER — DOXYCYCLINE HYCLATE 100 MG PO TABS: 100.0000 mg | ORAL_TABLET | Freq: Two times a day (BID) | ORAL | Status: DC

## 2015-02-08 MED ORDER — TRIAMCINOLONE ACETONIDE 0.1 % EX CREA: 1.0000 "application " | TOPICAL_CREAM | Freq: Two times a day (BID) | CUTANEOUS | Status: DC

## 2015-02-08 NOTE — Progress Notes (Signed)
Subjective:   Patient ID: Darryl White, male    DOB: Jan 31, 1941, 74 y.o.   MRN: [id number]  Darryl White is a pleasant 74 y.o. year old male who presents to clinic today with Hand Pain  on 02/08/2015  HPI:  Was in the hospital earlier this month for a fib.  A week after discharge his left 4th finger nail fell off.  No pain or swelling.  "turned white and fell off." No known injury other than recent hospitalization.  Two days ago, he woke up with the area underneath the nailbed throbbing and red.  No fevers.  No drainage.  Has been soaking it in warm salt water and it is now no longer throbbing.  Lab Results  Component Value Date   WBC 6.8 10/20/2014   HGB 14.7 10/20/2014   HCT 43.2 10/20/2014   MCV 99.0 10/20/2014   PLT 233.0 10/20/2014    Current Outpatient Prescriptions on File Prior to Visit  Medication Sig Dispense Refill  . apixaban (ELIQUIS) 5 MG TABS tablet Take 1 tablet (5 mg total) by mouth 2 (two) times daily. 60 tablet 3  . brimonidine (ALPHAGAN) 0.15 % ophthalmic solution Place 1 drop into both eyes 2 (two) times daily.     . Cholecalciferol (VITAMIN D3) 2000 UNITS capsule Take 2,000 Units by mouth daily.    Marland Kitchen diltiazem (CARDIZEM) 30 MG tablet Take 1 tablet (30 mg total) by mouth 2 (two) times daily as needed. 60 tablet 11  . fenofibrate (TRICOR) 145 MG tablet take 1 tablet by mouth once daily 90 tablet 2  . simvastatin (ZOCOR) 40 MG tablet Take 1 tablet (40 mg total) by mouth at bedtime. 90 tablet 1   No current facility-administered medications on file prior to visit.    Allergies  Allergen Reactions  . Sulfa Antibiotics Hives and Itching  . Penicillins Hives and Palpitations    Past Medical History  Diagnosis Date  . Chicken pox   . Diverticulitis   . History of stomach ulcers   . Hyperlipidemia   . Orthostatic hypotension   . 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 Paroxysmal atrial fibrillation [facility])     Past  Surgical History  Procedure Laterality Date  . Appendectomy    . Tonsillectomy and adenoidectomy    . Large intestine block    . Back surgery    . Pacemaker insertion      MDT dual chamber pacemaker    Family History  Problem Relation Age of Onset  . Cancer Mother   . Heart disease Father     Social History   Social History  . Marital Status: Married    Spouse Name: N/A  . Number of Children: N/A  . Years of Education: N/A   Occupational History  . Not on file.   Social History Main Topics  . Smoking status: Current Every Day Smoker -- 0.50 packs/day for 55 years  . Smokeless tobacco: Never Used  . Alcohol Use: Yes  . Drug Use: No  . Sexual Activity: No   Other Topics Concern  . Not on file   Social History Narrative   Married.   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      Does not have a living will.   Desires CPR, does not want prolonged life support if futile.   The PMH, PSH, Social History, Family History, Medications, and allergies have been reviewed in [facility], and  have been updated if relevant.  Review of Systems  Constitutional: Negative.   Respiratory: Negative.   Cardiovascular: Negative.   Gastrointestinal: Negative.   Musculoskeletal: Negative.   Neurological: Negative.   Hematological: Negative.   All other systems reviewed and are negative.      Objective:    BP 130/78 mmHg  Pulse 87  Temp(Src) 98.3 F (36.8 C) (Oral)  Wt 172 lb 8 oz (78.245 kg)  SpO2 97%   Physical Exam  Constitutional: He is oriented to person, place, and time. He appears well-developed and well-nourished. No distress.  HENT:  Head: Normocephalic.  Eyes: Conjunctivae are normal.  Cardiovascular: Normal rate.   Pulmonary/Chest: Effort normal.  Musculoskeletal: Normal range of motion.  Neurological: He is alert and oriented to person, place, and time. No cranial nerve deficit.  Skin: He is not diaphoretic.  Left fourth digit- nail missing, warm  swollen area beneath nail bed, mildly TTP, non fluctuant  Psychiatric: He has a normal mood and affect. His behavior is normal. Thought content normal.  Nursing note and vitals reviewed.         Assessment & Plan:   Paronychia of fourth finger, left - Plan: CBC with Differential/Platelet, Comprehensive metabolic panel  HLD (hyperlipidemia) - Plan: Lipid panel No Follow-up on file.

## 2015-02-08 NOTE — Patient Instructions (Signed)
Great to see you. Please soak and wrap foot as we discussed.  Take doxycyline 100 mg twice daily x 10 days.

## 2015-02-08 NOTE — Assessment & Plan Note (Signed)
New- with unclear etiology. Unclear why nail fell off first.  Cannot lift nail to drain as there is no nail. Oral abx to cover MRSA given recent hospitalization- doxycyline 100 mg twice daily x 10 days as he is allergic to PCN and sulfa. Continue warm soaks. Lab work today given usual presentation. The patient indicates understanding of these issues and agrees with the plan. Orders Placed This Encounter  Procedures  . CBC with Differential/Platelet  . Comprehensive metabolic panel  . Lipid panel

## 2015-02-08 NOTE — Progress Notes (Signed)
Pre visit review using our clinic review tool, if applicable. No additional management support is needed unless otherwise documented below in the visit note. 

## 2015-02-16 LAB — COMPREHENSIVE METABOLIC PANEL
ALT: 15 U/L (ref 0–53)
AST: 25 U/L (ref 0–37)
Albumin: 4.2 g/dL (ref 3.5–5.2)
Alkaline Phosphatase: 52 U/L (ref 39–117)
BUN: 17 mg/dL (ref 6–23)
CO2: 29 mEq/L (ref 19–32)
Calcium: 9.7 mg/dL (ref 8.4–10.5)
Chloride: 102 mEq/L (ref 96–112)
Creatinine, Ser: 1.13 mg/dL (ref 0.40–1.50)
GFR: 67.4 mL/min (ref >60.00)
Glucose, Bld: 96 mg/dL (ref 70–99)
Potassium: 4.5 mEq/L (ref 3.5–5.1)
Sodium: 136 mEq/L (ref 135–145)
Total Bilirubin: 0.3 mg/dL (ref 0.2–1.2)
Total Protein: 7 g/dL (ref 6.0–8.3)

## 2015-03-10 ENCOUNTER — Telehealth: Payer: Self-pay | Admitting: *Deleted

## 2015-03-10 NOTE — Telephone Encounter (Signed)
Pt requiring PA for Eliquis. PA has been faxed awaiting for approval.

## 2015-03-11 NOTE — Telephone Encounter (Signed)
Pt approved until 02/19/17.

## 2015-03-11 NOTE — Telephone Encounter (Signed)
Pt has been approved for Eliquis.

## 2015-03-30 ENCOUNTER — Encounter: Payer: Self-pay | Admitting: Family Medicine

## 2015-03-30 ENCOUNTER — Ambulatory Visit (INDEPENDENT_AMBULATORY_CARE_PROVIDER_SITE_OTHER): Payer: Medicare Other | Admitting: Family Medicine

## 2015-03-30 VITALS — BP 138/70 | HR 89 | Temp 98.0°F | Wt 171.5 lb

## 2015-03-30 DIAGNOSIS — B351 Tinea unguium: Secondary | ICD-10-CM | POA: Diagnosis not present

## 2015-03-30 MED ORDER — CICLOPIROX 8 % EX SOLN: Freq: Every day | CUTANEOUS | Status: DC

## 2015-03-30 NOTE — Assessment & Plan Note (Signed)
New- will treat initially with topical ciclopirox but explained that we may need to try a course of oral lamisil if no improvement. Call or return to clinic prn if these symptoms worsen or fail to improve as anticipated. The patient indicates understanding of these issues and agrees with the plan.

## 2015-03-30 NOTE — Progress Notes (Signed)
Pre visit review using our clinic review tool, if applicable. No additional management support is needed unless otherwise documented below in the visit note. 

## 2015-03-30 NOTE — Progress Notes (Signed)
Subjective:   Patient ID: Darryl White, male    DOB: 1940-12-06, 75 y.o.   MRN: [id number]  Darryl White is a pleasant 75 y.o. year old male who presents to clinic today with Hand Pain  on 03/30/2015  HPI:  Saw him two months ago for left 4th digit pain and swelling that began 1 week after being discharged from hospital for afib. Initially, No pain or swelling.  "turned white and fell off." No known injury.  Two days before he saw me in 01/2015,  he woke up with the area underneath the nailbed throbbing and red.  No fevers.  No drainage.   Given recent hospitalization, I placed him on oral doxycycline 100 mg twice daily x 10 days (PCN and sulfa allergic).  Also checked CBC which was unremarkable.  Initially symptoms improved but now the nail has grown back and again painful and "very white." Lab Results  Component Value Date   WBC 6.9 02/08/2015   HGB 14.4 02/08/2015   HCT 42.9 02/08/2015   MCV 99.7 02/08/2015   PLT 225.0 02/08/2015    Current Outpatient Prescriptions on File Prior to Visit  Medication Sig Dispense Refill  . apixaban (ELIQUIS) 5 MG TABS tablet Take 1 tablet (5 mg total) by mouth 2 (two) times daily. 60 tablet 3  . brimonidine (ALPHAGAN) 0.15 % ophthalmic solution Place 1 drop into both eyes 2 (two) times daily.     . Cholecalciferol (VITAMIN D3) 2000 UNITS capsule Take 2,000 Units by mouth daily.    Marland Kitchen diltiazem (CARDIZEM) 30 MG tablet Take 1 tablet (30 mg total) by mouth 2 (two) times daily as needed. 60 tablet 11  . fenofibrate (TRICOR) 145 MG tablet take 1 tablet by mouth once daily 90 tablet 2  . simvastatin (ZOCOR) 40 MG tablet Take 1 tablet (40 mg total) by mouth at bedtime. 90 tablet 1  . triamcinolone cream (KENALOG) 0.1 % Apply 1 application topically 2 (two) times daily. 30 g 0   No current facility-administered medications on file prior to visit.    Allergies  Allergen Reactions  . Sulfa Antibiotics Hives and Itching  . Penicillins Hives  and Palpitations    Past Medical History  Diagnosis Date  . Chicken pox   . Diverticulitis   . History of stomach ulcers   . Hyperlipidemia   . Orthostatic hypotension   . 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 Paroxysmal atrial fibrillation [facility])     Past Surgical History  Procedure Laterality Date  . Appendectomy    . Tonsillectomy and adenoidectomy    . Large intestine block    . Back surgery    . Pacemaker insertion      MDT dual chamber pacemaker    Family History  Problem Relation Age of Onset  . Cancer Mother   . Heart disease Father     Social History   Social History  . Marital Status: Married    Spouse Name: N/A  . Number of Children: N/A  . Years of Education: N/A   Occupational History  . Not on file.   Social History Main Topics  . Smoking status: Current Every Day Smoker -- 0.50 packs/day for 55 years  . Smokeless tobacco: Never Used  . Alcohol Use: Yes  . Drug Use: No  . Sexual Activity: No   Other Topics Concern  . Not on file   Social History Narrative  Married.   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      Does not have a living will.   Desires CPR, does not want prolonged life support if futile.   The PMH, PSH, Social History, Family History, Medications, and allergies have been reviewed in [facility], and have been updated if relevant.  Review of Systems  Constitutional: Negative.   Respiratory: Negative.   Cardiovascular: Negative.   Gastrointestinal: Negative.   Musculoskeletal: Negative.   Neurological: Negative.   Hematological: Negative.   All other systems reviewed and are negative.      Objective:    BP 138/70 mmHg  Pulse 89  Temp(Src) 98 F (36.7 C) (Oral)  Wt 171 lb 8 oz (77.792 kg)  SpO2 98%   Physical Exam  Constitutional: He is oriented to person, place, and time. He appears well-developed and well-nourished. No distress.  HENT:  Head: Normocephalic.  Eyes: Conjunctivae  are normal.  Cardiovascular: Normal rate.   Pulmonary/Chest: Effort normal.  Musculoskeletal: Normal range of motion.  Neurological: He is alert and oriented to person, place, and time. No cranial nerve deficit.  Skin: He is not diaphoretic.  Left fourth digit- nail appears friable and white in color.base of nailbed is a little red but nonfluctuant. Mild tenderness  Psychiatric: He has a normal mood and affect. His behavior is normal. Thought content normal.  Nursing note and vitals reviewed.         Assessment & Plan:   Onychomycosis No Follow-up on file.

## 2015-04-27 ENCOUNTER — Ambulatory Visit (INDEPENDENT_AMBULATORY_CARE_PROVIDER_SITE_OTHER): Payer: Medicare Other | Admitting: *Deleted

## 2015-04-27 DIAGNOSIS — I480 Paroxysmal atrial fibrillation: Secondary | ICD-10-CM | POA: Diagnosis not present

## 2015-04-27 DIAGNOSIS — Z950 Presence of cardiac pacemaker: Secondary | ICD-10-CM

## 2015-04-27 LAB — CUP PACEART REMOTE DEVICE CHECK
Battery Impedance: 908 Ohm
Battery Remaining Longevity: 53 mo
Battery Voltage: 2.77 V
Brady Statistic AP VP Percent: 1 %
Brady Statistic AP VS Percent: 68 %
Brady Statistic AS VP Percent: 0 %
Brady Statistic AS VS Percent: 30 %
Date Time Interrogation Session: 20170307124931
Implantable Lead Implant Date: 19941110
Implantable Lead Implant Date: 19941110
Implantable Lead Location: 753859
Implantable Lead Location: 753860
Implantable Lead Model: 5034
Implantable Lead Model: 5534
Lead Channel Impedance Value: 1086 Ohm
Lead Channel Impedance Value: 1105 Ohm
Lead Channel Setting Pacing Amplitude: 2 V
Lead Channel Setting Pacing Amplitude: 2 V
Lead Channel Setting Pacing Pulse Width: 0.4 ms
Lead Channel Setting Sensing Sensitivity: 5.6 mV

## 2015-04-27 NOTE — Telephone Encounter (Signed)
This encounter was created in error - please disregard.

## 2015-04-30 NOTE — Progress Notes (Signed)
Remote pacemaker transmission.   

## 2015-05-01 NOTE — Progress Notes (Signed)
Normal remote reviewed.  Next follow up 07/2015 in clinic - consider adjusting ADL rate response

## 2015-05-05 ENCOUNTER — Encounter: Payer: Self-pay | Admitting: Cardiology

## 2015-05-25 ENCOUNTER — Telehealth: Payer: Self-pay

## 2015-05-25 NOTE — Telephone Encounter (Signed)
Pt has appt to see Dr Deborra Medina on 05/27/15 at 12:15.

## 2015-05-25 NOTE — Telephone Encounter (Signed)
PLEASE NOTE: All timestamps contained within this report are represented as [place] Standard Time. CONFIDENTIALTY NOTICE: This fax transmission is intended only for the addressee. It contains information that is legally privileged, confidential or otherwise protected from use or disclosure. If you are not the intended recipient, you are strictly prohibited from reviewing, disclosing, copying using or disseminating any of this information or taking any action in reliance on or regarding this information. If you have received this fax in error, please notify us immediately by telephone so that we can arrange for its return to [place]. Phone: [phone], Toll-Free: [phone], Fax: [phone] Page: 1 of 1 Call Id: [id number] [place] [facility] - Client Nonclinical Telephone Record [place] [facility] - Client Client Site [address] - Day Physician [place], [place] Type Call Who Is Calling Patient / Member / Family / Caregiver Caller Name Greer Phone Number [phone] Patient Name Darryl White Call Type Message Only Information Provided Reason for Call Request to Schedule Office Appointment Initial Comment Caller states he has been getting tx for an infection in his thumb. He has a c/o diarrheapersistent, advised that he was to call to schedule an appt if this is persistent Call Closed By: Starleen Arms Transaction Date/Time: 05/25/2015 3:26:34 PM (ET)

## 2015-05-27 ENCOUNTER — Ambulatory Visit (INDEPENDENT_AMBULATORY_CARE_PROVIDER_SITE_OTHER): Payer: Medicare Other | Admitting: Family Medicine

## 2015-05-27 ENCOUNTER — Encounter: Payer: Self-pay | Admitting: Family Medicine

## 2015-05-27 ENCOUNTER — Encounter: Payer: Self-pay | Admitting: Gastroenterology

## 2015-05-27 VITALS — BP 116/60 | HR 88 | Temp 98.0°F | Wt 169.2 lb

## 2015-05-27 DIAGNOSIS — Z86010 Personal history of colonic polyps: Secondary | ICD-10-CM | POA: Diagnosis not present

## 2015-05-27 DIAGNOSIS — R197 Diarrhea, unspecified: Secondary | ICD-10-CM | POA: Insufficient documentation

## 2015-05-27 NOTE — Patient Instructions (Addendum)
Good to see you.  Start taking an over the counter pro biotic- Humphrey Rolls is a good one.  I will call you with your results from today.  If you have time, see Rosaria Ferries up front.

## 2015-05-27 NOTE — Progress Notes (Signed)
Subjective:   Patient ID: Darryl White, male    DOB: 1941-01-05, 75 y.o.   MRN: [id number]  Darryl White is a pleasant 75 y.o. year old male who presents to clinic today with Diarrhea  on 05/27/2015  HPI:  3 weeks of watery diarrhea.  Was on doxycyline a month ago. Denies abdominal pain or blood in stool. No nausea or vomiting.  No fevers.  Nothing seems to make it better or worse.    H/o polyps- last colonoscopy 03/2010- 5 year recall.  No recent travel.  Wife does not have similar symptoms.  Current Outpatient Prescriptions on File Prior to Visit  Medication Sig Dispense Refill  . apixaban (ELIQUIS) 5 MG TABS tablet Take 1 tablet (5 mg total) by mouth 2 (two) times daily. 60 tablet 3  . brimonidine (ALPHAGAN) 0.15 % ophthalmic solution Place 1 drop into both eyes 2 (two) times daily.     . Cholecalciferol (VITAMIN D3) 2000 UNITS capsule Take 2,000 Units by mouth daily.    Marland Kitchen diltiazem (CARDIZEM) 30 MG tablet Take 1 tablet (30 mg total) by mouth 2 (two) times daily as needed. 60 tablet 11  . fenofibrate (TRICOR) 145 MG tablet take 1 tablet by mouth once daily 90 tablet 2  . simvastatin (ZOCOR) 40 MG tablet Take 1 tablet (40 mg total) by mouth at bedtime. 90 tablet 1   No current facility-administered medications on file prior to visit.    Allergies  Allergen Reactions  . Sulfa Antibiotics Hives and Itching  . Penicillins Hives and Palpitations    Past Medical History  Diagnosis Date  . Chicken pox   . Diverticulitis   . History of stomach ulcers   . Hyperlipidemia   . Orthostatic hypotension   . 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 Paroxysmal atrial fibrillation [facility])     Past Surgical History  Procedure Laterality Date  . Appendectomy    . Tonsillectomy and adenoidectomy    . Large intestine block    . Back surgery    . Pacemaker insertion      MDT dual chamber pacemaker    Family History  Problem Relation Age of Onset  .  Cancer Mother   . Heart disease Father     Social History   Social History  . Marital Status: Married    Spouse Name: N/A  . Number of Children: N/A  . Years of Education: N/A   Occupational History  . Not on file.   Social History Main Topics  . Smoking status: Current Every Day Smoker -- 0.50 packs/day for 55 years  . Smokeless tobacco: Never Used  . Alcohol Use: Yes  . Drug Use: No  . Sexual Activity: No   Other Topics Concern  . Not on file   Social History Narrative   Married.   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      Does not have a living will.   Desires CPR, does not want prolonged life support if futile.   The PMH, PSH, Social History, Family History, Medications, and allergies have been reviewed in [facility], and have been updated if relevant.   Review of Systems  Constitutional: Negative.   HENT: Negative.   Eyes: Negative.   Gastrointestinal: Positive for diarrhea. Negative for nausea, vomiting, abdominal pain, constipation, blood in stool, abdominal distention, anal bleeding and rectal pain.  Musculoskeletal: Negative.   Skin: Negative.   Allergic/Immunologic:  Negative.   Neurological: Negative.   Hematological: Negative.   Psychiatric/Behavioral: Negative.   All other systems reviewed and are negative.      Objective:    BP 116/60 mmHg  Pulse 88  Temp(Src) 98 F (36.7 C) (Oral)  Wt 169 lb 4 oz (76.771 kg)  SpO2 98%   Physical Exam  Constitutional: He is oriented to person, place, and time. He appears well-developed and well-nourished. No distress.  HENT:  Head: Normocephalic.  Eyes: Conjunctivae are normal.  Cardiovascular: Normal rate.   Pulmonary/Chest: Effort normal.  Abdominal: Soft. Bowel sounds are normal. He exhibits no distension and no mass. There is no tenderness. There is no rebound and no guarding.  Musculoskeletal: Normal range of motion.  Neurological: He is alert and oriented to person, place, and time. No  cranial nerve deficit.  Skin: Skin is warm and dry. He is not diaphoretic.  Psychiatric: He has a normal mood and affect. His behavior is normal. Judgment and thought content normal.  Nursing note and vitals reviewed.         Assessment & Plan:   Diarrhea, unspecified type No Follow-up on file.

## 2015-05-27 NOTE — Addendum Note (Signed)
Addended by: Ellamae Sia on: 05/27/2015 02:33 PM   Modules accepted: Orders

## 2015-05-27 NOTE — Progress Notes (Signed)
Pre visit review using our clinic review tool, if applicable. No additional management support is needed unless otherwise documented below in the visit note. 

## 2015-05-27 NOTE — Assessment & Plan Note (Signed)
New- Rule out c diff given recent abx use. Add probiotic. Refer to GI as he is due for screening colonoscopy. The patient indicates understanding of these issues and agrees with the plan.

## 2015-05-28 NOTE — Addendum Note (Signed)
Addended by: Ellamae Sia on: 05/28/2015 10:07 AM   Modules accepted: Orders

## 2015-05-29 LAB — C. DIFFICILE GDH AND TOXIN A/B
C. difficile GDH: NOT DETECTED
C. difficile Toxin A/B: NOT DETECTED

## 2015-05-31 ENCOUNTER — Other Ambulatory Visit: Payer: Self-pay | Admitting: Internal Medicine

## 2015-05-31 ENCOUNTER — Ambulatory Visit: Payer: Medicare Other | Admitting: Family Medicine

## 2015-06-16 ENCOUNTER — Ambulatory Visit (INDEPENDENT_AMBULATORY_CARE_PROVIDER_SITE_OTHER): Payer: Medicare Other | Admitting: Gastroenterology

## 2015-06-16 ENCOUNTER — Encounter: Payer: Self-pay | Admitting: Gastroenterology

## 2015-06-16 ENCOUNTER — Other Ambulatory Visit (INDEPENDENT_AMBULATORY_CARE_PROVIDER_SITE_OTHER): Payer: Medicare Other

## 2015-06-16 ENCOUNTER — Telehealth: Payer: Self-pay

## 2015-06-16 VITALS — BP 102/68 | HR 68 | Ht 66.0 in | Wt 168.8 lb

## 2015-06-16 DIAGNOSIS — R197 Diarrhea, unspecified: Secondary | ICD-10-CM

## 2015-06-16 DIAGNOSIS — Z86010 Personal history of colonic polyps: Secondary | ICD-10-CM | POA: Diagnosis not present

## 2015-06-16 DIAGNOSIS — Z7901 Long term (current) use of anticoagulants: Secondary | ICD-10-CM | POA: Diagnosis not present

## 2015-06-16 LAB — COMPREHENSIVE METABOLIC PANEL
ALT: 15 U/L (ref 0–53)
AST: 25 U/L (ref 0–37)
Albumin: 4.5 g/dL (ref 3.5–5.2)
Alkaline Phosphatase: 46 U/L (ref 39–117)
BUN: 13 mg/dL (ref 6–23)
CO2: 28 mEq/L (ref 19–32)
Calcium: 10.1 mg/dL (ref 8.4–10.5)
Chloride: 101 mEq/L (ref 96–112)
Creatinine, Ser: 1.19 mg/dL (ref 0.40–1.50)
GFR: 63.43 mL/min (ref >60.00)
Glucose, Bld: 101 mg/dL — ABNORMAL HIGH (ref 70–99)
Potassium: 4.6 mEq/L (ref 3.5–5.1)
Sodium: 135 mEq/L (ref 135–145)
Total Bilirubin: 0.6 mg/dL (ref 0.2–1.2)
Total Protein: 7.6 g/dL (ref 6.0–8.3)

## 2015-06-16 LAB — CBC WITH DIFFERENTIAL/PLATELET
Basophils Absolute: 0 10*3/uL (ref 0.0–0.1)
Basophils Relative: 0.3 % (ref 0.0–3.0)
Eosinophils Absolute: 0.1 10*3/uL (ref 0.0–0.7)
Eosinophils Relative: 1.6 % (ref 0.0–5.0)
HCT: 44.2 % (ref 39.0–52.0)
Hemoglobin: 15.3 g/dL (ref 13.0–17.0)
Lymphocytes Relative: 26 % (ref 12.0–46.0)
Lymphs Abs: 1.9 10*3/uL (ref 0.7–4.0)
MCHC: 34.6 g/dL (ref 30.0–36.0)
MCV: 97.7 fl (ref 78.0–100.0)
Monocytes Absolute: 0.5 10*3/uL (ref 0.1–1.0)
Monocytes Relative: 7.2 % (ref 3.0–12.0)
Neutro Abs: 4.6 10*3/uL (ref 1.4–7.7)
Neutrophils Relative %: 64.9 % (ref 43.0–77.0)
Platelets: 240 10*3/uL (ref 150.0–400.0)
RBC: 4.53 Mil/uL (ref 4.22–5.81)
RDW: 13.9 % (ref 11.5–15.5)
WBC: 7.1 10*3/uL (ref 4.0–10.5)

## 2015-06-16 LAB — HIGH SENSITIVITY CRP: CRP, High Sensitivity: 0.63 mg/L (ref 0.000–5.000)

## 2015-06-16 LAB — SEDIMENTATION RATE: Sed Rate: 8 mm/hr (ref 0–22)

## 2015-06-16 MED ORDER — NA SULFATE-K SULFATE-MG SULF 17.5-3.13-1.6 GM/177ML PO SOLN: 1.0000 | Freq: Once | ORAL | Status: DC

## 2015-06-16 NOTE — Telephone Encounter (Signed)
  06/16/2015   RE: JEHAN BONANO DOB: 1940-04-13 MRN: [id number]   Dear Caryl Comes,    We have scheduled the above patient for an endoscopic procedure. Our records show that he is on anticoagulation therapy.   Please advise as to how long the patient may come off his therapy of Eliquis prior to the procedure, which is scheduled for 07/06/2015.  Please fax back/ or route the completed form to [facility] at [phone].   Sincerely,    Phillis Haggis

## 2015-06-16 NOTE — Progress Notes (Signed)
HPI :  Darryl White, new to our clinic, seen in consultation for Dr. Arnette Norris, here for symptoms of diarrhea and history of colon polyps.  He endorses having diarrhea for one month. He is having roughly up to 4-5 BMs per day, max of 8 or so. Loose stools and soft. No blood in the stools. No abdominal pains with this. He is having nocturnal stools and waking at night to use the bathroom. He thinks he has lost 4-5 lbs since it started. He is eating well. No vomiting. Nothing like this previously. He was given course of doxycycline for infection in his finger nail, which seemed to predate his sypmtoms, about month or so prior to symptoms beginning. C diff testing negative. No fevers or nightsweats. Taking Align probiotic which he does not think helps at all. Otherwise he has not tried anything else.   He takes Eliquis for history of atrial fibrillation. He has a pacemaker in place. No history of CVA. He denies NSAID use. No other new medications he has been exposed to.  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 This has also been ongoing for the past month.    Colonoscopy 03/2010 - diverticulosis, hemorrhoids, multiple polyps - several > 10-67m in size, in all portions of the colon. [place] Dr[place] He was told to return in 5 years.  No FH of CRC known.  Past Medical History  Diagnosis Date  . Chicken pox   . Diverticulitis   . History of stomach ulcers   . Hyperlipidemia   . Orthostatic hypotension   . 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 Paroxysmal atrial fibrillation ([facility]   . Pancreatitis      Past Surgical History  Procedure Laterality Date  . Appendectomy    . Tonsillectomy and adenoidectomy    . Large intestine block    . Back surgery    . Pacemaker insertion      MDT dual chamber pacemaker   Family History  Problem Relation Age of Onset  . Cancer Mother   . Heart disease Father     Social History  Substance Use Topics  . Smoking status: Current Every Day Smoker -- 0.50 packs/day for 55 years  . Smokeless tobacco: Never Used  . Alcohol Use: 0.0 oz/week    0 Standard drinks or equivalent per week   Current Outpatient Prescriptions  Medication Sig Dispense Refill  . brimonidine (ALPHAGAN) 0.15 % ophthalmic solution Place 1 drop into both eyes 2 (two) times daily.     . Cholecalciferol (VITAMIN D3) 2000 UNITS capsule Take 2,000 Units by mouth daily.    .Marland Kitchendiltiazem (CARDIZEM) 30 MG tablet Take 1 tablet (30 mg total) by mouth 2 (two) times daily as needed. 60 tablet 11  . ELIQUIS 5 MG TABS tablet take 1 tablet by mouth twice a day 60 tablet 3  . fenofibrate (TRICOR) 145 MG tablet take 1 tablet by mouth once daily 90 tablet 2  . Probiotic Product (ALIGN) 4 MG CAPS Take 1 capsule by mouth daily.    . simvastatin (ZOCOR) 40 MG tablet Take 1 tablet (40 mg total) by mouth at bedtime. 90 tablet 1   No current facility-administered medications for this visit.   Allergies  Allergen Reactions  . Sulfa Antibiotics Hives and Itching  . Penicillins Hives and Palpitations  Review of Systems: All systems reviewed and negative except where noted in HPI.   Lab Results  Component Value Date   WBC 7.1 06/16/2015   HGB 15.3 06/16/2015   HCT 44.2 06/16/2015   MCV 97.7 06/16/2015   PLT 240.0 06/16/2015    Lab Results  Component Value Date   ALT 15 06/16/2015   AST 25 06/16/2015   ALKPHOS 46 06/16/2015   BILITOT 0.6 06/16/2015    Lab Results  Component Value Date   CREATININE 1.19 06/16/2015   BUN 13 06/16/2015   NA 135 06/16/2015   K 4.6 06/16/2015   CL 101 06/16/2015   CO2 28 06/16/2015   No results found for: CRP  Lab Results  Component Value Date   ESRSEDRATE 8 06/16/2015      Physical Exam: BP 102/68 mmHg  Pulse 68  Ht 5' 6"  (1.676 m)  Wt 168 lb 12.8 oz (Darryl.567 kg)  BMI 27.26 kg/m2 Constitutional: Pleasant,well-developed, White in no acute  distress. HEENT: Normocephalic and atraumatic. Conjunctivae are normal. No scleral icterus. Neck supple.  Cardiovascular: Normal rate, regular rhythm.  Pulmonary/chest: Effort normal and breath sounds normal. No wheezing, rales or rhonchi. Abdominal: Soft, nondistended, nontender. Bowel sounds active throughout. There are no masses palpable. No hepatomegaly. Extremities: no edema. Some synovitis of fingers in PIP , DIP appreciated Lymphadenopathy: No cervical adenopathy noted. Neurological: Alert and oriented to person place and time. Skin: Skin is warm and dry. No rashes noted. Psychiatric: Normal mood and affect. Behavior is normal.   ASSESSMENT AND PLAN: 75 y/o White with 4 weeks of increased stool frequency / loose stools, with antibiotic exposure 2 months ago however C Diff testing negative. Symptoms have persisted since it started. He also has new synovitis in his fingers / hands which correlate with this time frame. Labs obtained today - CBC, CMP, and ESR are normal. CRP pending. No other new medications to have caused this. He otherwise had multiple large polyps removed in 2012 and is overdue for a surveillance colonoscopy. In light of his polyp history and his ongoing symptoms, I recommend a colonoscopy at this time, evaluate for polyp and ensure no IBD/microscopic colitis, although his labs are reassuring. I recommend he try some immodium in the interim to help slow his stool frequency. He should hold his Eliquis 2 days prior to the procedure for GFR > 60.   The indications, risks, and benefits of colonoscopy were explained to the patient in detail. Risks include but are not limited to bleeding, perforation, adverse reaction to medications, and cardiopulmonary compromise. Sequelae include but are not limited to the possibility of surgery, hospitalization, and mortality. The patient verbalized understanding and wished to proceed. All questions answered, referred to the scheduler and bowel  prep ordered. Further recommendations pending results of the exam.   [place] Cellar, MD [facility] Pager [phone]  CC: Lucille Passy, MD

## 2015-06-16 NOTE — Patient Instructions (Addendum)
You have been given a separate informational sheet regarding your tobacco use, the importance of quitting and local resources to help you quit.   Your physician has requested that you go to the basement for lab work before leaving today  You have been scheduled for a colonoscopy. Please follow written instructions given to you at your visit today.  Please pick up your prep supplies at the pharmacy within the next 1-3 days. If you use inhalers (even only as needed), please bring them with you on the day of your procedure. Your physician has requested that you go to www.startemmi.com and enter the access code given to you at your visit today. This web site gives a general overview about your procedure. However, you should still follow specific instructions given to you by our office regarding your preparation for the procedure.

## 2015-06-17 ENCOUNTER — Telehealth: Payer: Self-pay

## 2015-06-17 NOTE — Telephone Encounter (Signed)
Left message that, per [name], patient could stop his Eliquis 36 hours prior to his procedure.  I asked that he call and leave me a message that he received and understood this information

## 2015-06-17 NOTE — Telephone Encounter (Signed)
Stop 36 hrs prior to procedure ie last dose 5/14 pm

## 2015-06-22 ENCOUNTER — Encounter: Payer: Self-pay | Admitting: Gastroenterology

## 2015-07-06 ENCOUNTER — Encounter: Payer: Self-pay | Admitting: Gastroenterology

## 2015-07-06 ENCOUNTER — Ambulatory Visit (AMBULATORY_SURGERY_CENTER): Payer: Medicare Other | Admitting: Gastroenterology

## 2015-07-06 VITALS — BP 113/76 | HR 74 | Temp 98.6°F | Resp 15 | Ht 66.0 in | Wt 168.0 lb

## 2015-07-06 DIAGNOSIS — Z860100 Personal history of colon polyps, unspecified: Secondary | ICD-10-CM

## 2015-07-06 DIAGNOSIS — D128 Benign neoplasm of rectum: Secondary | ICD-10-CM | POA: Diagnosis not present

## 2015-07-06 DIAGNOSIS — D123 Benign neoplasm of transverse colon: Secondary | ICD-10-CM | POA: Diagnosis not present

## 2015-07-06 DIAGNOSIS — R197 Diarrhea, unspecified: Secondary | ICD-10-CM

## 2015-07-06 MED ORDER — SODIUM CHLORIDE 0.9 % IV SOLN: 500.0000 mL | INTRAVENOUS | Status: DC

## 2015-07-06 NOTE — Progress Notes (Signed)
Called to room to assist during endoscopic procedure.  Patient ID and intended procedure confirmed with present staff. Received instructions for my participation in the procedure from the performing physician.  

## 2015-07-06 NOTE — Progress Notes (Signed)
Report to PACU, RN, vss, BBS= Clear.  

## 2015-07-06 NOTE — Op Note (Signed)
[place] Patient Name: Darryl White Procedure Date: 07/06/2015 9:27 AM MRN: [id number] Endoscopist: Remo Lipps P. Havery Moros , MD Age: 75 Referring MD:  Date of Birth: 1940/08/28 Gender: Male Procedure:                Colonoscopy Indications:              High risk colon cancer surveillance: Personal                            history of colonic polyps, ongoing diarrhea Medicines:                Monitored Anesthesia Care Procedure:                Pre-Anesthesia Assessment:                           - Prior to the procedure, a History and Physical                            was performed, and patient medications and                            allergies were reviewed. The patient's tolerance of                            previous anesthesia was also reviewed. The risks                            and benefits of the procedure and the sedation                            options and risks were discussed with the patient.                            All questions were answered, and informed consent                            was obtained. Prior Anticoagulants: The patient has                            taken Eliquis (apixaban), last dose was 2 days                            prior to procedure. ASA Grade Assessment: III - A                            patient with severe systemic disease. After                            reviewing the risks and benefits, the patient was                            deemed in satisfactory condition to undergo the  procedure.                           After obtaining informed consent, the colonoscope                            was passed under direct vision. Throughout the                            procedure, the patient's blood pressure, pulse, and                            oxygen saturations were monitored continuously. The                            Model CF-HQ190L [id number]) scope was introduced     through the anus and advanced to the the terminal                            ileum, with identification of the appendiceal                            orifice and IC valve. The colonoscopy was performed                            without difficulty. The patient tolerated the                            procedure well. The quality of the bowel                            preparation was adequate. The terminal ileum,                            ileocecal valve, appendiceal orifice, and rectum                            were photographed. Scope In: 9:56:03 AM Scope Out: 10:17:14 AM Scope Withdrawal Time: 0 hours 19 minutes 18 seconds  Total Procedure Duration: 0 hours 21 minutes 11 seconds  Findings:                 The perianal and digital rectal examinations were                            normal.                           Many small and large-mouthed diverticula were found                            in the entire colon.                           A 4 mm polyp versus inverted diverticulum was found  in the transverse colon. The polyp was sessile. The                            polyp was removed with a cold biopsy forceps.                            Resection and retrieval were complete.                           A 3 mm polyp was found in the transverse colon. The                            polyp was sessile. The polyp was removed with a                            cold biopsy forceps. Resection and retrieval were                            complete.                           A 3 mm polyp was found in the rectum. The polyp was                            sessile. The polyp was removed with a cold biopsy                            forceps. Resection and retrieval were complete.                           The terminal ileum appeared normal.                           The exam was otherwise normal throughout the                            examined colon. No inflammatory  changes appreciated.                           Biopsies for histology were taken with a cold                            forceps from the right colon and left colon for                            evaluation of microscopic colitis.                           Non-bleeding internal hemorrhoids were found during                            retroflexion. The hemorrhoids were moderate. Complications:            No immediate complications. Estimated blood loss:  Minimal. Estimated Blood Loss:     Estimated blood loss was minimal. Impression:               - Diverticulosis in the entire examined colon.                           - One 4 mm polyp verses inverted diverticulum in                            the transverse colon, removed with a cold biopsy                            forceps. Resected and retrieved.                           - One 3 mm polyp in the transverse colon, removed                            with a cold biopsy forceps. Resected and retrieved.                           - One 3 mm polyp in the rectum, removed with a cold                            biopsy forceps. Resected and retrieved.                           - The examined portion of the ileum was normal.                           - Non-bleeding internal hemorrhoids.                           - Biopsies were taken with a cold forceps from the                            right colon and left colon for evaluation of                            microscopic colitis. Recommendation:           - Patient has a contact number available for                            emergencies. The signs and symptoms of potential                            delayed complications were discussed with the                            patient. Return to normal activities tomorrow.                            Written discharge instructions were provided to the  patient.                           - Resume  previous diet.                           - Continue present medications.                           - Resume Eliquis tonight                           - Await pathology results.                           - Repeat colonoscopy is recommended for                            surveillance. The colonoscopy date will be                            determined after pathology results from today's                            exam become available for review. Remo Lipps P. Armbruster, MD 07/06/2015 10:22:33 AM This report has been signed electronically.

## 2015-07-06 NOTE — Patient Instructions (Signed)
YOU HAD AN ENDOSCOPIC PROCEDURE TODAY AT [facility]:   Refer to the procedure report that was given to you for any specific questions about what was found during the examination.  If the procedure report does not answer your questions, please call your gastroenterologist to clarify.  If you requested that your care partner not be given the details of your procedure findings, then the procedure report has been included in a sealed envelope for you to review at your convenience later.  YOU SHOULD EXPECT: Some feelings of bloating in the abdomen. Passage of more gas than usual.  Walking can help get rid of the air that was put into your GI tract during the procedure and reduce the bloating. If you had a lower endoscopy (such as a colonoscopy or flexible sigmoidoscopy) you may notice spotting of blood in your stool or on the toilet paper. If you underwent a bowel prep for your procedure, you may not have a normal bowel movement for a few days.  Please Note:  You might notice some irritation and congestion in your nose or some drainage.  This is from the oxygen used during your procedure.  There is no need for concern and it should clear up in a day or so.  SYMPTOMS TO REPORT IMMEDIATELY:   Following lower endoscopy (colonoscopy or flexible sigmoidoscopy):  Excessive amounts of blood in the stool  Significant tenderness or worsening of abdominal pains  Swelling of the abdomen that is new, acute  Fever of 100F or higher  For urgent or emergent issues, a gastroenterologist can be reached at any hour by calling [phone].   DIET: Your first meal following the procedure should be a small meal and then it is ok to progress to your normal diet. Heavy or fried foods are harder to digest and may make you feel nauseous or bloated.  Likewise, meals heavy in dairy and vegetables can increase bloating.  Drink plenty of fluids but you should avoid alcoholic beverages for 24  hours.  ACTIVITY:  You should plan to take it easy for the rest of today and you should NOT DRIVE or use heavy machinery until tomorrow (because of the sedation medicines used during the test).    FOLLOW UP: Our staff will call the number listed on your records the next business day following your procedure to check on you and address any questions or concerns that you may have regarding the information given to you following your procedure. If we do not reach you, we will leave a message.  However, if you are feeling well and you are not experiencing any problems, there is no need to return our call.  We will assume that you have returned to your regular daily activities without incident.  If any biopsies were taken you will be contacted by phone or by letter within the next 1-3 weeks.  Please call us at [phone] if you have not heard about the biopsies in 3 weeks.    SIGNATURES/CONFIDENTIALITY: You and/or your care partner have signed paperwork which will be entered into your electronic medical record.  These signatures attest to the fact that that the information above on your After Visit Summary has been reviewed and is understood.  Full responsibility of the confidentiality of this discharge information lies with you and/or your care-partner.  Polyps, diverticulosis-handouts given  Restart Eliquis today.  Repeat colonoscopy will be determined by pathology.

## 2015-07-07 ENCOUNTER — Telehealth: Payer: Self-pay | Admitting: *Deleted

## 2015-07-07 NOTE — Telephone Encounter (Signed)
  Follow up Call-  Call back number 07/06/2015  Post procedure Call Back phone  # [phone]  Permission to leave phone message Yes     Patient questions:  Do you have a fever, pain , or abdominal swelling? No. Pain Score  0 *  Have you tolerated food without any problems? Yes.    Have you been able to return to your normal activities? Yes.    Do you have any questions about your discharge instructions: Diet   No. Medications  No. Follow up visit  No.  Do you have questions or concerns about your Care? No.  Actions: * If pain score is 4 or above: No action needed, pain <4.

## 2015-07-09 ENCOUNTER — Other Ambulatory Visit: Payer: Self-pay | Admitting: Gastroenterology

## 2015-07-09 ENCOUNTER — Encounter: Payer: Self-pay | Admitting: *Deleted

## 2015-07-09 DIAGNOSIS — K52832 Lymphocytic colitis: Secondary | ICD-10-CM

## 2015-07-09 MED ORDER — BUDESONIDE 3 MG PO CPEP: ORAL_CAPSULE | ORAL | Status: DC

## 2015-09-08 ENCOUNTER — Ambulatory Visit (INDEPENDENT_AMBULATORY_CARE_PROVIDER_SITE_OTHER): Payer: Medicare Other | Admitting: Gastroenterology

## 2015-09-08 ENCOUNTER — Encounter: Payer: Self-pay | Admitting: Gastroenterology

## 2015-09-08 VITALS — BP 124/72 | HR 86 | Ht 66.0 in | Wt 165.0 lb

## 2015-09-08 DIAGNOSIS — E785 Hyperlipidemia, unspecified: Secondary | ICD-10-CM

## 2015-09-08 DIAGNOSIS — K52832 Lymphocytic colitis: Secondary | ICD-10-CM

## 2015-09-08 DIAGNOSIS — Z86010 Personal history of colonic polyps: Secondary | ICD-10-CM

## 2015-09-08 NOTE — Patient Instructions (Addendum)
You may use Imodium or pepto bismol as needed.  Please follow up as needed    I appreciate the opportunity to care for you.

## 2015-09-08 NOTE — Progress Notes (Signed)
HPI :  LAST VISIT: 75 y/o male, new to our clinic, seen in consultation for Dr. Arnette Norris, here for symptoms of diarrhea and history of colon polyps.  He endorses having diarrhea for one month. He is having roughly up to 4-5 BMs per day, max of 8 or so. Loose stools and soft. No blood in the stools. No abdominal pains with this. He is having nocturnal stools and waking at night to use the bathroom. He thinks he has lost 4-5 lbs since it started. He is eating well. No vomiting. Nothing like this previously. He was given course of doxycycline for infection in his finger nail, which seemed to predate his sypmtoms, about month or so prior to symptoms beginning. C diff testing negative. No fevers or nightsweats. Taking Align probiotic which he does not think helps at all. Otherwise he has not tried anything else.   He takes Eliquis for history of atrial fibrillation. He has a pacemaker in place. No history of CVA. He denies NSAID use. No other new medications he has been exposed to.  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 This has also been ongoing for the past month.   Colonoscopy 03/2010 - diverticulosis, hemorrhoids, multiple polyps - several > 10-67m in size, in all portions of the colon. Done by Dr. MDoyle Askewin [place] He was told to return in 5 years.  No FH of CRC known.  SINCE LAST VISIT:  Patient had a colonoscopy for history of polyps and for ongoing diarrhea. He had diverticulosis noted, one adenoma, and a few hyperplastic polyps. 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 On review of medications he was taking simvastatin which has been linked to lymphocytic colitis. He stopped zocor and I offered him a trial of budesonide '9mg'$  daily for one month, and then '6mg'$  x 2 weeks, and '3mg'$  x 2 weeks, to see if he has improvement.  He reports really doing well since being on  budesonide. He took budesonide for 2 months, he took '9mg'$  for a month, and then tapered to '6mg'$  for 2 weeks, and then '3mg'$  for two weeks. It worked really. He reports having 2 BMs per day. He feels back to normal. He finished the course about a week ago.  He is on Eliquis. He denies NSAIDs. He only takes tylenol PRN. He remains off zocor.    Past Medical History  Diagnosis Date  . Chicken pox   . Diverticulitis   . History of stomach ulcers   . Hyperlipidemia   . Orthostatic hypotension   . 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 Paroxysmal atrial fibrillation ([facility]   . Pancreatitis   . Lymphocytic colitis      Past Surgical History  Procedure Laterality Date  . Appendectomy    . Tonsillectomy and adenoidectomy    . Large intestine block    . Back surgery    . Pacemaker insertion      MDT dual chamber pacemaker   Family History  Problem Relation Age of Onset  . Cancer Mother   . Heart disease Father    Social History  Substance Use Topics  . Smoking status: Current Every Day Smoker -- 0.50 packs/day for 55 years  . Smokeless tobacco: Never Used  . Alcohol Use: 0.0 oz/week    0 Standard drinks or equivalent  per week   Current Outpatient Prescriptions  Medication Sig Dispense Refill  . brimonidine (ALPHAGAN) 0.15 % ophthalmic solution Place 1 drop into both eyes 2 (two) times daily.     . Cholecalciferol (VITAMIN D3) 2000 UNITS capsule Take 2,000 Units by mouth daily.    Marland Kitchen diltiazem (CARDIZEM) 30 MG tablet Take 1 tablet (30 mg total) by mouth 2 (two) times daily as needed. 60 tablet 11  . ELIQUIS 5 MG TABS tablet take 1 tablet by mouth twice a day 60 tablet 3  . fenofibrate (TRICOR) 145 MG tablet take 1 tablet by mouth once daily 90 tablet 2   No current facility-administered medications for this visit.   Allergies  Allergen Reactions  . Sulfa Antibiotics Hives and Itching  . Penicillins Hives and Palpitations     Review of Systems: All  systems reviewed and negative except where noted in HPI.    Lab Results  Component Value Date   WBC 7.1 06/16/2015   HGB 15.3 06/16/2015   HCT 44.2 06/16/2015   MCV 97.7 06/16/2015   PLT 240.0 06/16/2015   Lab Results  Component Value Date   CREATININE 1.19 06/16/2015   BUN 13 06/16/2015   NA 135 06/16/2015   K 4.6 06/16/2015   CL 101 06/16/2015   CO2 28 06/16/2015   Lab Results  Component Value Date   ALT 15 06/16/2015   AST 25 06/16/2015   ALKPHOS 46 06/16/2015   BILITOT 0.6 06/16/2015     Physical Exam: BP 124/72 mmHg  Pulse 86  Ht '5\' 6"'$  (1.676 m)  Wt 165 lb (74.844 kg)  BMI 26.64 kg/m2 Constitutional: Pleasant,well-developed, male in no acute distress. HEENT: Normocephalic and atraumatic. Conjunctivae are normal. No scleral icterus. Neck supple.  Cardiovascular: Normal rate, regular rhythm.  Pulmonary/chest: Effort normal and breath sounds normal. No wheezing, rales or rhonchi. Abdominal: Soft, nondistended, nontender. Bowel sounds active throughout. There are no masses palpable. No hepatomegaly. Extremities: no edema Lymphadenopathy: No cervical adenopathy noted. Neurological: Alert and oriented to person place and time. Skin: Skin is warm and dry. No rashes noted. Psychiatric: Normal mood and affect. Behavior is normal.   ASSESSMENT AND PLAN: 75 y/o male who presented with diarrhea and history of polyps. 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 In regards to possible etiologies, simvastatin is listed as an "intermediate risk" medication for the development of this, and he has held it. He responded quite well to budesonide and has had complete resolution of symptoms. Moving forward I asked him to see his primary care to reassess his hyperlipidemia and see if he needs another statin (I would avoid simvastatin and fluvastatin which have been linked to microscopic colitis). I otherwise advised him that his symptoms could recur, and if they  do he should contact me. He can use immodium or bismuth PRN for mild symptoms, but if he has severe symptoms again will treat again with budesonide. He agreed. Recall colonoscopy in 5 years for small adenoma. F/U PRN otherwise.   [place] Cellar, MD [facility] Pager [phone]

## 2015-09-09 ENCOUNTER — Other Ambulatory Visit: Payer: Self-pay | Admitting: Internal Medicine

## 2015-09-29 ENCOUNTER — Telehealth: Payer: Self-pay | Admitting: Internal Medicine

## 2015-09-29 ENCOUNTER — Other Ambulatory Visit: Payer: Self-pay | Admitting: *Deleted

## 2015-09-29 MED ORDER — APIXABAN 5 MG PO TABS: 5.0000 mg | ORAL_TABLET | Freq: Two times a day (BID) | ORAL | 1 refills | Status: DC

## 2015-09-29 NOTE — Telephone Encounter (Signed)
  Requested Prescriptions   Signed Prescriptions Disp Refills  . apixaban (ELIQUIS) 5 MG TABS tablet 180 tablet 1    Sig: Take 1 tablet (5 mg total) by mouth 2 (two) times daily.    Authorizing Provider: Deboraha Sprang    Ordering User: Britt Bottom

## 2015-09-29 NOTE — Telephone Encounter (Signed)
Requested Prescriptions   Signed Prescriptions Disp Refills  . apixaban (ELIQUIS) 5 MG TABS tablet 180 tablet 1    Sig: Take 1 tablet (5 mg total) by mouth 2 (two) times daily.    Authorizing Provider: Deboraha Sprang    Ordering User: Britt Bottom

## 2015-09-29 NOTE — Telephone Encounter (Signed)
°*  STAT* If patient is at the pharmacy, call can be transferred to refill team.   1. Which medications need to be refilled? (please list name of each medication and dose if known) Eliquis   2. Which pharmacy/location (including street and city if local pharmacy) is medication to be sent to? [facility] on [address]   3. Do they need a 30 day or 90 day supply? 90 day  For patient takes 2 pills a day

## 2015-10-07 ENCOUNTER — Ambulatory Visit (INDEPENDENT_AMBULATORY_CARE_PROVIDER_SITE_OTHER): Payer: Medicare Other | Admitting: Family Medicine

## 2015-10-07 ENCOUNTER — Encounter: Payer: Self-pay | Admitting: Family Medicine

## 2015-10-07 DIAGNOSIS — M2550 Pain in unspecified joint: Secondary | ICD-10-CM

## 2015-10-07 DIAGNOSIS — M1990 Unspecified osteoarthritis, unspecified site: Secondary | ICD-10-CM | POA: Insufficient documentation

## 2015-10-07 LAB — COMPREHENSIVE METABOLIC PANEL
ALT: 13 U/L (ref 0–53)
AST: 22 U/L (ref 0–37)
Albumin: 4.2 g/dL (ref 3.5–5.2)
Alkaline Phosphatase: 44 U/L (ref 39–117)
BUN: 14 mg/dL (ref 6–23)
CO2: 25 mEq/L (ref 19–32)
Calcium: 9.8 mg/dL (ref 8.4–10.5)
Chloride: 102 mEq/L (ref 96–112)
Creatinine, Ser: 1.09 mg/dL (ref 0.40–1.50)
GFR: 70.14 mL/min (ref >60.00)
Glucose, Bld: 103 mg/dL — ABNORMAL HIGH (ref 70–99)
Potassium: 4.4 mEq/L (ref 3.5–5.1)
Sodium: 133 mEq/L — ABNORMAL LOW (ref 135–145)
Total Bilirubin: 0.5 mg/dL (ref 0.2–1.2)
Total Protein: 7.2 g/dL (ref 6.0–8.3)

## 2015-10-07 LAB — SEDIMENTATION RATE: Sed Rate: 27 mm/hr — ABNORMAL HIGH (ref 0–20)

## 2015-10-07 LAB — RHEUMATOID FACTOR: Rhuematoid fact SerPl-aCnc: 135 IU/mL — ABNORMAL HIGH (ref <=14)

## 2015-10-07 LAB — CBC WITH DIFFERENTIAL/PLATELET
Basophils Absolute: 0 10*3/uL (ref 0.0–0.1)
Basophils Relative: 0.3 % (ref 0.0–3.0)
Eosinophils Absolute: 0.1 10*3/uL (ref 0.0–0.7)
Eosinophils Relative: 1.7 % (ref 0.0–5.0)
HCT: 41.2 % (ref 39.0–52.0)
Hemoglobin: 14.1 g/dL (ref 13.0–17.0)
Lymphocytes Relative: 23.6 % (ref 12.0–46.0)
Lymphs Abs: 1.7 10*3/uL (ref 0.7–4.0)
MCHC: 34.2 g/dL (ref 30.0–36.0)
MCV: 98.4 fl (ref 78.0–100.0)
Monocytes Absolute: 0.5 10*3/uL (ref 0.1–1.0)
Monocytes Relative: 6.8 % (ref 3.0–12.0)
Neutro Abs: 4.9 10*3/uL (ref 1.4–7.7)
Neutrophils Relative %: 67.6 % (ref 43.0–77.0)
Platelets: 263 10*3/uL (ref 150.0–400.0)
RBC: 4.18 Mil/uL — ABNORMAL LOW (ref 4.22–5.81)
RDW: 13.9 % (ref 11.5–15.5)
WBC: 7.3 10*3/uL (ref 4.0–10.5)

## 2015-10-07 NOTE — Progress Notes (Signed)
Pre visit review using our clinic review tool, if applicable. No additional management support is needed unless otherwise documented below in the visit note. 

## 2015-10-07 NOTE — Patient Instructions (Signed)
Great to see you. I will call you with your lab results.   

## 2015-10-07 NOTE — Assessment & Plan Note (Signed)
New- multiple joints effected. Check labs today as part of initial work up. The patient indicates understanding of these issues and agrees with the plan. Orders Placed This Encounter  Procedures  . Sedimentation Rate  . Rheumatoid factor  . Comprehensive metabolic panel  . CBC with Differential/Platelet

## 2015-10-07 NOTE — Progress Notes (Signed)
Subjective:   Patient ID: Darryl White, male    DOB: March 04, 1940, 75 y.o.   MRN: [id number]  Darryl White is a pleasant 75 y.o. year old male who presents to clinic today with Joint Pain (mostly in limbs and extremities. zocor possible cause)  on 10/07/2015  HPI:  Several weeks of progressive joint pain.  Pain moves around his upper body- mostly his left elbow and hands.  No changes in medication.  He is active.  Has been taking Tylenol 3 times daily for the past week without improvement of symptoms.  Joints are not red or warm.  Hands are very still in the morning when he wakes up.  Unsure if anyone in his family had arthritis or rheum disease.  On eliquis so he avoids NSAIDs.  Current Outpatient Prescriptions on File Prior to Visit  Medication Sig Dispense Refill  . apixaban (ELIQUIS) 5 MG TABS tablet Take 1 tablet (5 mg total) by mouth 2 (two) times daily. 180 tablet 1  . brimonidine (ALPHAGAN) 0.15 % ophthalmic solution Place 1 drop into both eyes 2 (two) times daily.     . Cholecalciferol (VITAMIN D3) 2000 UNITS capsule Take 2,000 Units by mouth daily.    Marland Kitchen diltiazem (CARDIZEM) 30 MG tablet Take 1 tablet (30 mg total) by mouth 2 (two) times daily as needed. 60 tablet 11  . fenofibrate (TRICOR) 145 MG tablet take 1 tablet by mouth once daily 90 tablet 2   No current facility-administered medications on file prior to visit.     Allergies  Allergen Reactions  . Sulfa Antibiotics Hives and Itching  . Penicillins Hives and Palpitations    Past Medical History:  Diagnosis Date  . Chicken pox   . 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 Diverticulitis   . History of stomach ulcers   . Hyperlipidemia   . Lymphocytic colitis   . Orthostatic hypotension   . Pancreatitis   . Paroxysmal atrial fibrillation [facility])     Past Surgical History:  Procedure Laterality Date  . APPENDECTOMY    . BACK SURGERY    . large intestine block    . PACEMAKER  INSERTION     MDT dual chamber pacemaker  . TONSILLECTOMY AND ADENOIDECTOMY      Family History  Problem Relation Age of Onset  . Cancer Mother   . Heart disease Father     Social History   Social History  . Marital status: Married    Spouse name: N/A  . Number of children: 2  . Years of education: N/A   Occupational History  . Retired    Social History Main Topics  . Smoking status: Current Every Day Smoker    Packs/day: 0.50    Years: 55.00  . Smokeless tobacco: Never Used  . Alcohol use 0.0 oz/week  . Drug use: No  . Sexual activity: No   Other Topics Concern  . Not on file   Social History Narrative   Married.   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      Does not have a living will.   Desires CPR, does not want prolonged life support if futile.   The PMH, PSH, Social History, Family History, Medications, and allergies have been reviewed in [facility], and have been updated if relevant.    Review of Systems  Constitutional: Negative.   Genitourinary: Negative.   Musculoskeletal: Positive for arthralgias and joint swelling. Negative  for back pain, gait problem, myalgias, neck pain and neck stiffness.  Neurological: Negative.   Hematological: Negative.   Psychiatric/Behavioral: Negative.   All other systems reviewed and are negative.      Objective:    BP 126/70   Pulse 87   Temp 98.2 F (36.8 C) (Oral)   Wt 167 lb 4 oz (75.9 kg)   SpO2 97%   BMI 26.99 kg/m    Physical Exam  Constitutional: He appears well-developed and well-nourished. No distress.  HENT:  Head: Normocephalic.  Eyes: Conjunctivae are normal.  Cardiovascular: Normal rate.   Pulmonary/Chest: Effort normal.  Musculoskeletal:  Mildly enlarged DIPs and PIPs, otherwise unremarkable  Skin: Skin is warm and dry. He is not diaphoretic.  Psychiatric: He has a normal mood and affect. His behavior is normal. Judgment and thought content normal.  Nursing note and vitals  reviewed.         Assessment & Plan:   Arthralgia No Follow-up on file.

## 2015-10-08 ENCOUNTER — Telehealth: Payer: Self-pay | Admitting: Family Medicine

## 2015-10-08 ENCOUNTER — Other Ambulatory Visit: Payer: Self-pay | Admitting: Family Medicine

## 2015-10-08 DIAGNOSIS — R768 Other specified abnormal immunological findings in serum: Secondary | ICD-10-CM

## 2015-10-08 NOTE — Telephone Encounter (Signed)
Darryl White can you help patient

## 2015-10-08 NOTE — Telephone Encounter (Signed)
Please see result note.  I have referred him to a rheumatologist.

## 2015-10-08 NOTE — Telephone Encounter (Signed)
Pt called wanting to know what is  the next step  He stating he saw the results to his labs on my chart

## 2015-10-08 NOTE — Telephone Encounter (Signed)
Yes, Im sorry, I was waiting on pt to be advised of results. Did not realize he had mychart. Will work on it now.  Thanks

## 2015-10-15 ENCOUNTER — Telehealth: Payer: Self-pay

## 2015-10-15 NOTE — Telephone Encounter (Signed)
Patient is on the list for [employer] 2017 and may be a good candidate for an AWV in 2017. Please let me know if/when appt is scheduled.   

## 2015-10-18 DIAGNOSIS — M2550 Pain in unspecified joint: Secondary | ICD-10-CM | POA: Insufficient documentation

## 2015-10-18 DIAGNOSIS — R768 Other specified abnormal immunological findings in serum: Secondary | ICD-10-CM | POA: Insufficient documentation

## 2015-11-25 ENCOUNTER — Encounter: Payer: Self-pay | Admitting: Family Medicine

## 2015-12-02 ENCOUNTER — Ambulatory Visit: Payer: Medicare Other | Admitting: Primary Care

## 2015-12-02 DIAGNOSIS — R768 Other specified abnormal immunological findings in serum: Secondary | ICD-10-CM | POA: Insufficient documentation

## 2015-12-02 DIAGNOSIS — M109 Gout, unspecified: Secondary | ICD-10-CM | POA: Insufficient documentation

## 2015-12-07 ENCOUNTER — Encounter: Payer: Self-pay | Admitting: Internal Medicine

## 2015-12-07 ENCOUNTER — Ambulatory Visit (INDEPENDENT_AMBULATORY_CARE_PROVIDER_SITE_OTHER): Payer: Medicare Other | Admitting: Internal Medicine

## 2015-12-07 VITALS — BP 110/70 | HR 92 | Ht 66.0 in | Wt 163.1 lb

## 2015-12-07 DIAGNOSIS — I480 Paroxysmal atrial fibrillation: Secondary | ICD-10-CM

## 2015-12-07 DIAGNOSIS — Z950 Presence of cardiac pacemaker: Secondary | ICD-10-CM | POA: Diagnosis not present

## 2015-12-07 DIAGNOSIS — R55 Syncope and collapse: Secondary | ICD-10-CM | POA: Diagnosis not present

## 2015-12-07 LAB — CUP PACEART INCLINIC DEVICE CHECK
Battery Impedance: 1093 Ohm
Battery Remaining Longevity: 47 mo
Battery Voltage: 2.76 V
Brady Statistic AP VP Percent: 1 %
Brady Statistic AP VS Percent: 73 %
Brady Statistic AS VP Percent: 0 %
Brady Statistic AS VS Percent: 26 %
Date Time Interrogation Session: 20171017151836
Implantable Lead Implant Date: 19941110
Implantable Lead Implant Date: 19941110
Implantable Lead Location: 753859
Implantable Lead Location: 753860
Implantable Lead Model: 5034
Implantable Lead Model: 5534
Lead Channel Impedance Value: 1091 Ohm
Lead Channel Impedance Value: 944 Ohm
Lead Channel Pacing Threshold Amplitude: 0.5 V
Lead Channel Pacing Threshold Amplitude: 0.5 V
Lead Channel Pacing Threshold Pulse Width: 0.4 ms
Lead Channel Pacing Threshold Pulse Width: 0.4 ms
Lead Channel Sensing Intrinsic Amplitude: 15.67 mV
Lead Channel Sensing Intrinsic Amplitude: 4 mV
Lead Channel Setting Pacing Amplitude: 2 V
Lead Channel Setting Pacing Amplitude: 2 V
Lead Channel Setting Pacing Pulse Width: 0.4 ms
Lead Channel Setting Sensing Sensitivity: 5.6 mV

## 2015-12-07 MED ORDER — APIXABAN 5 MG PO TABS: 5.0000 mg | ORAL_TABLET | Freq: Two times a day (BID) | ORAL | 3 refills | Status: DC

## 2015-12-07 NOTE — Progress Notes (Signed)
      Patient Care Team: Lucille Passy, MD as PCP - General (Family Medicine) Will Meredith Leeds, MD as Consulting Physician (Cardiology)   HPI  Darryl White is a 75 y.o. male Seen to establish followup.  Prev seen at [facility] for  PM and OI associated with syncope  New episode AFib>> [facility] 8/16 and converted spontanously   He had atrial undersensing resulting in failure to mode switch and tracking at the upper rate limit\  He is anticoagulated with apixoban  He has had no intercurrent tachypalpitations.  He has hx of PM implanted initially 20 years ago for syncope. He has had no recurrent syncope.  This was presumably neurally mediated by the family's description.    8/17 Cr 1.09 K4.4 ALT 13  Myoview 8/16 EF 65% No ischemia   Past Medical History:  Diagnosis Date  . Chicken pox   . 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 Diverticulitis   . History of stomach ulcers   . Hyperlipidemia   . Lymphocytic colitis   . Orthostatic hypotension   . Pancreatitis   . Paroxysmal atrial fibrillation [facility])     Past Surgical History:  Procedure Laterality Date  . APPENDECTOMY    . BACK SURGERY    . large intestine block    . PACEMAKER INSERTION     MDT dual chamber pacemaker  . TONSILLECTOMY AND ADENOIDECTOMY      Current Outpatient Prescriptions  Medication Sig Dispense Refill  . apixaban (ELIQUIS) 5 MG TABS tablet Take 1 tablet (5 mg total) by mouth 2 (two) times daily. 180 tablet 1  . brimonidine (ALPHAGAN) 0.15 % ophthalmic solution Place 1 drop into both eyes 2 (two) times daily.     . Cholecalciferol (VITAMIN D3) 2000 UNITS capsule Take 2,000 Units by mouth daily.    Marland Kitchen diltiazem (CARDIZEM) 30 MG tablet Take 1 tablet (30 mg total) by mouth 2 (two) times daily as needed. 60 tablet 11  . fenofibrate (TRICOR) 145 MG tablet take 1 tablet by mouth once daily 90 tablet 2   No current facility-administered medications for this visit.     Allergies    Allergen Reactions  . Sulfa Antibiotics Hives and Itching  . Penicillins Hives and Palpitations      Review of Systems negative except from HPI and PMH  Physical Exam BP 110/70   Pulse 92   Ht '5\' 6"'$  (1.676 m)   Wt 163 lb 1.9 oz (74 kg)   SpO2 95%   BMI 26.33 kg/m  Well developed and well nourished in no acute distress HENT normal E scleral and icterus clear Neck Supple JVP flat; carotids brisk and full Clear to ausculation Device pocket well healed; without hematoma or erythema.  There is no tethering Regular rate and rhythm, no murmurs gallops or rub Soft with active bowel sounds No clubbing cyanosis  Edema Alert and oriented, grossly normal motor and sensory function Skin Warm and Dry  ECG demonstrates atrial pacing at 80 Intervals 20/08/34 with PACs  Assessment and  Plan  Syncope-neurally mediated  Labile hypertension and orthostatic hypotension  Pacemaker-Medtronic  Atrial fibrillation-paroxysmal  CHADS-VASc score-1  The patient's pacemaker is functioning normally. He has had no recurrent syncope.   He is on apixaban and tolerated without bleeding issues  No intercurrent afib, but freq PACs  Assoc with functional atrial noncapture  No symptomatic OI  Continue current meds

## 2015-12-07 NOTE — Patient Instructions (Signed)

## 2015-12-10 ENCOUNTER — Other Ambulatory Visit: Payer: Self-pay | Admitting: Family Medicine

## 2015-12-28 ENCOUNTER — Encounter: Payer: Self-pay | Admitting: Internal Medicine

## 2016-01-04 DIAGNOSIS — Z79899 Other long term (current) drug therapy: Secondary | ICD-10-CM | POA: Insufficient documentation

## 2016-01-04 DIAGNOSIS — M059 Rheumatoid arthritis with rheumatoid factor, unspecified: Secondary | ICD-10-CM | POA: Insufficient documentation

## 2016-02-08 ENCOUNTER — Ambulatory Visit (INDEPENDENT_AMBULATORY_CARE_PROVIDER_SITE_OTHER): Payer: Medicare Other

## 2016-02-08 VITALS — BP 108/68 | HR 72 | Temp 98.7°F | Ht 66.0 in | Wt 162.2 lb

## 2016-02-08 DIAGNOSIS — Z0000 Encounter for general adult medical examination without abnormal findings: Secondary | ICD-10-CM | POA: Diagnosis not present

## 2016-02-08 NOTE — Progress Notes (Signed)
Pre visit review using our clinic review tool, if applicable. No additional management support is needed unless otherwise documented below in the visit note. 

## 2016-02-08 NOTE — Progress Notes (Signed)
I reviewed health advisor's note, was available for consultation, and agree with documentation and plan.  Kyndall Chaplin, MD [facility]  

## 2016-02-08 NOTE — Patient Instructions (Signed)
Mr. Darryl White , Thank you for taking time to come for your Medicare Wellness Visit. I appreciate your ongoing commitment to your health goals. Please review the following plan we discussed and let me know if I can assist you in the future.   These are the goals we discussed: Goals    . Increase physical activity          Starting 02/08/2016, I will continue to walk at least 1 mile daily as weather permits.        This is a list of the screening recommended for you and due dates:  Health Maintenance  Topic Date Due  . Pneumonia vaccines (2 of 2 - PPSV23) 03/22/2016  . Tetanus Vaccine  02/07/2026*  . Colon Cancer Screening  07/05/2020  . Flu Shot  Completed  . Shingles Vaccine  Completed  *Topic was postponed. The date shown is not the original due date.   Preventive Care for Adults  A healthy lifestyle and preventive care can promote health and wellness. Preventive health guidelines for adults include the following key practices.  . A routine yearly physical is a good way to check with your health care provider about your health and preventive screening. It is a chance to share any concerns and updates on your health and to receive a thorough exam.  . Visit your dentist for a routine exam and preventive care every 6 months. Brush your teeth twice a day and floss once a day. Good oral hygiene prevents tooth decay and gum disease.  . The frequency of eye exams is based on your age, health, family medical history, use  of contact lenses, and other factors. Follow your health care provider's ecommendations for frequency of eye exams.  . Eat a healthy diet. Foods like vegetables, fruits, whole grains, low-fat dairy products, and lean protein foods contain the nutrients you need without too many calories. Decrease your intake of foods high in solid fats, added sugars, and salt. Eat the right amount of calories for you. Get information about a proper diet from your health care provider, if  necessary.  . Regular physical exercise is one of the most important things you can do for your health. Most adults should get at least 150 minutes of moderate-intensity exercise (any activity that increases your heart rate and causes you to sweat) each week. In addition, most adults need muscle-strengthening exercises on 2 or more days a week.  Silver Sneakers may be a benefit available to you. To determine eligibility, you may visit the website: www.silversneakers.com or contact program at 650-507-7727 Mon-Fri between 8AM-8PM.   . Maintain a healthy weight. The body mass index (BMI) is a screening tool to identify possible weight problems. It provides an estimate of body fat based on height and weight. Your health care provider can find your BMI and can help you achieve or maintain a healthy weight.   For adults 20 years and older: ? A BMI below 18.5 is considered underweight. ? A BMI of 18.5 to 24.9 is normal. ? A BMI of 25 to 29.9 is considered overweight. ? A BMI of 30 and above is considered obese.   . Maintain normal blood lipids and cholesterol levels by exercising and minimizing your intake of saturated fat. Eat a balanced diet with plenty of fruit and vegetables. Blood tests for lipids and cholesterol should begin at age 9 and be repeated every 5 years. If your lipid or cholesterol levels are high, you are over 50, or  you are at high risk for heart disease, you may need your cholesterol levels checked more frequently. Ongoing high lipid and cholesterol levels should be treated with medicines if diet and exercise are not working.  . If you smoke, find out from your health care provider how to quit. If you do not use tobacco, please do not start.  . If you choose to drink alcohol, please do not consume more than 2 drinks per day. One drink is considered to be 12 ounces (355 mL) of beer, 5 ounces (148 mL) of wine, or 1.5 ounces (44 mL) of liquor.  . If you are 75-19 years old, ask your  health care provider if you should take aspirin to prevent strokes.  . Use sunscreen. Apply sunscreen liberally and repeatedly throughout the day. You should seek shade when your shadow is shorter than you. Protect yourself by wearing long sleeves, pants, a wide-brimmed hat, and sunglasses year round, whenever you are outdoors.  . Once a month, do a whole body skin exam, using a mirror to look at the skin on your back. Tell your health care provider of new moles, moles that have irregular borders, moles that are larger than a pencil eraser, or moles that have changed in shape or color.

## 2016-02-08 NOTE — Progress Notes (Signed)
Subjective:   Darryl White is a 75 y.o. male who presents for Medicare Annual/Subsequent preventive examination.  Review of Systems:  N/A Cardiac Risk Factors include: advanced age (>21mn, >>78women);male gender;dyslipidemia;smoking/ tobacco exposure     Objective:    Vitals: BP 108/68 (BP Location: Left Arm, Patient Position: Sitting, Cuff Size: Normal)   Pulse 72   Temp 98.7 F (37.1 C) (Oral)   Ht '5\' 6"'$  (1.676 m) Comment: no shoes  Wt 162 lb 4 oz (73.6 kg)   SpO2 95%   BMI 26.19 kg/m   Body mass index is 26.19 kg/m.  Tobacco History  Smoking Status  . Current Every Day Smoker  . Packs/day: 0.50  . Years: 55.00  Smokeless Tobacco  . Never Used     Ready to quit: No Counseling given: No   Past Medical History:  Diagnosis Date  . Chicken pox   . 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 Diverticulitis   . History of stomach ulcers   . Hyperlipidemia   . Lymphocytic colitis   . Orthostatic hypotension   . Pancreatitis   . Paroxysmal atrial fibrillation ([facility]    Past Surgical History:  Procedure Laterality Date  . APPENDECTOMY    . BACK SURGERY    . large intestine block    . PACEMAKER INSERTION     MDT dual chamber pacemaker  . TONSILLECTOMY AND ADENOIDECTOMY     Family History  Problem Relation Age of Onset  . Cancer Mother   . Heart disease Father    History  Sexual Activity  . Sexual activity: No    Outpatient Encounter Prescriptions as of 02/08/2016  Medication Sig  . apixaban (ELIQUIS) 5 MG TABS tablet Take 1 tablet (5 mg total) by mouth 2 (two) times daily.  . brimonidine (ALPHAGAN) 0.15 % ophthalmic solution Place 1 drop into both eyes 2 (two) times daily.   . Cholecalciferol (VITAMIN D3) 2000 UNITS capsule Take 2,000 Units by mouth daily.  .Marland Kitchendiltiazem (CARDIZEM) 30 MG tablet Take 1 tablet (30 mg total) by mouth 2 (two) times daily as needed.  . fenofibrate (TRICOR) 145 MG tablet Take 1 tablet (145 mg total) by  mouth daily. OFFICE VISIT WITH LABS REQUIRED FOR ADDITIONAL REFILLS  . FOLIC ACID PO Take 1 tablet by mouth daily.  . methotrexate 2.5 MG tablet Take 10 mg by mouth once a week.   No facility-administered encounter medications on file as of 02/08/2016.     Activities of Daily Living In your present state of health, do you have any difficulty performing the following activities: 02/08/2016  Hearing? N  Vision? N  Difficulty concentrating or making decisions? N  Walking or climbing stairs? N  Dressing or bathing? N  Doing errands, shopping? N  Preparing Food and eating ? N  Using the Toilet? N  In the past six months, have you accidently leaked urine? N  Do you have problems with loss of bowel control? N  Managing your Medications? N  Managing your Finances? N  Housekeeping or managing your Housekeeping? N  Some recent data might be hidden    Patient Care Team: TLucille Passy MD as PCP - General (Family Medicine) Will MMeredith Leeds MD as Consulting Physician (Cardiology) SDeboraha Sprang MD as Consulting Physician (Cardiology) ARonnell Freshwater MD as Referring Physician (Ophthalmology) CMarlowe Sax MD as Referring Physician (Internal Medicine)   Assessment:     Hearing  Screening   '125Hz'$  '250Hz'$  '500Hz'$  '1000Hz'$  '2000Hz'$  '3000Hz'$  '4000Hz'$  '6000Hz'$  '8000Hz'$   Right ear:   40 40 40  40    Left ear:   40 40 40  40    Vision Screening Comments: Last vision exam in July 2017 with Dr. Cephus Shelling   Exercise Activities and Dietary recommendations Current Exercise Habits: Home exercise routine, Type of exercise: walking, Time (Minutes): 30, Frequency (Times/Week): 7, Weekly Exercise (Minutes/Week): 210, Intensity: Mild, Exercise limited by: None identified  Goals    . Increase physical activity          Starting 02/08/2016, I will continue to walk at least 1 mile daily as weather permits.       Fall Risk Fall Risk  02/08/2016 10/21/2014  Falls in the past year? No No   Depression  Screen PHQ 2/9 Scores 02/08/2016 10/21/2014  PHQ - 2 Score 0 0    Cognitive Function MMSE - Mini Mental State Exam 02/08/2016  Orientation to time 5  Orientation to Place 5  Registration 3  Attention/ Calculation 0  Recall 3  Language- name 2 objects 0  Language- repeat 1  Language- follow 3 step command 3  Language- read & follow direction 0  Write a sentence 0  Copy design 0  Total score 20     PLEASE NOTE: A Mini-Cog screen was completed. Maximum score is 20. A value of 0 denotes this part of Folstein MMSE was not completed or the patient failed this part of the Mini-Cog screening.   Mini-Cog Screening Orientation to Time - Max 5 pts Orientation to Place - Max 5 pts Registration - Max 3 pts Recall - Max 3 pts Language Repeat - Max 1 pts Language Follow 3 Step Command - Max 3 pts     Immunization History  Administered Date(s) Administered  . Influenza-Unspecified 11/24/2015  . Pneumococcal Conjugate-13 01/30/2014  . Zoster 10/21/2011   Screening Tests Health Maintenance  Topic Date Due  . PNA vac Low Risk Adult (2 of 2 - PPSV23) 03/22/2016 (Originally 01/31/2015)  . TETANUS/TDAP  02/07/2026 (Originally [birth year]/1959)  . COLONOSCOPY  07/05/2020  . INFLUENZA VACCINE  Completed  . ZOSTAVAX  Completed      Plan:     I have personally reviewed and addressed the Medicare Annual Wellness questionnaire and have noted the following in the patient's chart:  A. Medical and social history B. Use of alcohol, tobacco or illicit drugs  C. Current medications and supplements D. Functional ability and status E.  Nutritional status F.  Physical activity G. Advance directives H. List of other physicians I.  Hospitalizations, surgeries, and ER visits in previous 12 months J.  [place] to include hearing, vision, cognitive, depression L. Referrals and appointments - none  In addition, I have reviewed and discussed with patient certain preventive protocols, quality  metrics, and best practice recommendations. A written personalized care plan for preventive services as well as general preventive health recommendations were provided to patient.  See attached scanned questionnaire for additional information.   Signed,   Lindell Noe, MHA, BS, LPN Health Coach

## 2016-02-08 NOTE — Progress Notes (Signed)
PCP notes:   Health maintenance:  Tetanus vaccine - postponed/pt feels he has had vaccine within past 10 yrs PPSV23 - postponed/pt was asked to bring in immunization from pharmacy before getting a PNA vaccine as pt was unable to specify which vaccine he has taken  Abnormal screenings:   None  Patient concerns:   None  Nurse concerns:  None  Next PCP appt:   02/29/2016 @ 0915

## 2016-02-23 ENCOUNTER — Other Ambulatory Visit: Payer: Self-pay | Admitting: Family Medicine

## 2016-02-23 DIAGNOSIS — E785 Hyperlipidemia, unspecified: Secondary | ICD-10-CM

## 2016-02-23 DIAGNOSIS — Z0000 Encounter for general adult medical examination without abnormal findings: Secondary | ICD-10-CM

## 2016-02-24 ENCOUNTER — Other Ambulatory Visit (INDEPENDENT_AMBULATORY_CARE_PROVIDER_SITE_OTHER): Payer: Medicare Other

## 2016-02-24 DIAGNOSIS — Z0000 Encounter for general adult medical examination without abnormal findings: Secondary | ICD-10-CM

## 2016-02-24 DIAGNOSIS — E785 Hyperlipidemia, unspecified: Secondary | ICD-10-CM

## 2016-02-24 LAB — CBC WITH DIFFERENTIAL/PLATELET
Basophils Absolute: 0 10*3/uL (ref 0.0–0.1)
Basophils Relative: 0.7 % (ref 0.0–3.0)
Eosinophils Absolute: 0.2 10*3/uL (ref 0.0–0.7)
Eosinophils Relative: 2.4 % (ref 0.0–5.0)
HCT: 43.5 % (ref 39.0–52.0)
Hemoglobin: 15.2 g/dL (ref 13.0–17.0)
Lymphocytes Relative: 31.3 % (ref 12.0–46.0)
Lymphs Abs: 2.1 10*3/uL (ref 0.7–4.0)
MCHC: 34.9 g/dL (ref 30.0–36.0)
MCV: 99.2 fl (ref 78.0–100.0)
Monocytes Absolute: 0.6 10*3/uL (ref 0.1–1.0)
Monocytes Relative: 8.4 % (ref 3.0–12.0)
Neutro Abs: 3.9 10*3/uL (ref 1.4–7.7)
Neutrophils Relative %: 57.2 % (ref 43.0–77.0)
Platelets: 251 10*3/uL (ref 150.0–400.0)
RBC: 4.39 Mil/uL (ref 4.22–5.81)
RDW: 14.5 % (ref 11.5–15.5)
WBC: 6.8 10*3/uL (ref 4.0–10.5)

## 2016-02-24 LAB — LIPID PANEL
Cholesterol: 154 mg/dL (ref 0–200)
HDL: 59.9 mg/dL (ref >39.00)
LDL Cholesterol: 76 mg/dL (ref 0–99)
NonHDL: 94.12
Total CHOL/HDL Ratio: 3
Triglycerides: 89 mg/dL (ref 0.0–149.0)
VLDL: 17.8 mg/dL (ref 0.0–40.0)

## 2016-02-24 LAB — COMPREHENSIVE METABOLIC PANEL
ALT: 20 U/L (ref 0–53)
AST: 27 U/L (ref 0–37)
Albumin: 4.4 g/dL (ref 3.5–5.2)
Alkaline Phosphatase: 47 U/L (ref 39–117)
BUN: 19 mg/dL (ref 6–23)
CO2: 30 mEq/L (ref 19–32)
Calcium: 9.4 mg/dL (ref 8.4–10.5)
Chloride: 101 mEq/L (ref 96–112)
Creatinine, Ser: 1.09 mg/dL (ref 0.40–1.50)
GFR: 70.06 mL/min (ref >60.00)
Glucose, Bld: 97 mg/dL (ref 70–99)
Potassium: 4.3 mEq/L (ref 3.5–5.1)
Sodium: 136 mEq/L (ref 135–145)
Total Bilirubin: 0.7 mg/dL (ref 0.2–1.2)
Total Protein: 7.1 g/dL (ref 6.0–8.3)

## 2016-02-24 LAB — PSA: PSA: 0.48 ng/mL (ref 0.10–4.00)

## 2016-02-29 ENCOUNTER — Encounter: Payer: Self-pay | Admitting: Family Medicine

## 2016-02-29 ENCOUNTER — Ambulatory Visit (INDEPENDENT_AMBULATORY_CARE_PROVIDER_SITE_OTHER): Payer: Medicare Other | Admitting: Family Medicine

## 2016-02-29 VITALS — BP 116/54 | HR 84 | Temp 98.2°F | Ht 66.5 in | Wt 162.8 lb

## 2016-02-29 DIAGNOSIS — M05749 Rheumatoid arthritis with rheumatoid factor of unspecified hand without organ or systems involvement: Secondary | ICD-10-CM | POA: Diagnosis not present

## 2016-02-29 DIAGNOSIS — E785 Hyperlipidemia, unspecified: Secondary | ICD-10-CM | POA: Diagnosis not present

## 2016-02-29 DIAGNOSIS — I480 Paroxysmal atrial fibrillation: Secondary | ICD-10-CM | POA: Diagnosis not present

## 2016-02-29 DIAGNOSIS — M069 Rheumatoid arthritis, unspecified: Secondary | ICD-10-CM | POA: Insufficient documentation

## 2016-02-29 DIAGNOSIS — Z0000 Encounter for general adult medical examination without abnormal findings: Secondary | ICD-10-CM | POA: Insufficient documentation

## 2016-02-29 DIAGNOSIS — Z720 Tobacco use: Secondary | ICD-10-CM

## 2016-02-29 MED ORDER — FENOFIBRATE 145 MG PO TABS: 145.0000 mg | ORAL_TABLET | Freq: Every day | ORAL | 2 refills | Status: DC

## 2016-02-29 NOTE — Assessment & Plan Note (Signed)
Refer for follow up lung cancer screening CT.

## 2016-02-29 NOTE — Assessment & Plan Note (Signed)
Reviewed preventive care protocols, scheduled due services, and updated immunizations Discussed nutrition, exercise, diet, and healthy lifestyle.  

## 2016-02-29 NOTE — Progress Notes (Signed)
Pre visit review using our clinic review tool, if applicable. No additional management support is needed unless otherwise documented below in the visit note. 

## 2016-02-29 NOTE — Assessment & Plan Note (Signed)
Doing well on MTX.

## 2016-02-29 NOTE — Progress Notes (Signed)
Subjective:   Patient ID: Darryl White, male    DOB: 07-May-1940, 76 y.o.   MRN: [id number]  Darryl White is a pleasant 76 y.o. year old male who presents to clinic today with Annual Exam  and follow up of chronic medication problems on 02/29/2016/  HPI:  Medicare wellness visit with Candis Musa, RN on 02/08/16.  Note reviewed.   Colonoscopy 07/06/15 Prevnar 13 01/30/2014 Pneumovax 11/25/15  Eye exam 12/2013 Zostavax received at a pharmacy in [place]  RA- followed by rheumatology. Last seen by Dr. Annalee Genta on 02/07/16. Note reviewed. Doing well on MTX/folic acid. Has minimal pain and no swelling now.  Afib- followed by [name].  Last seen on 12/07/15. S/p pacer placement.  Doing well.  On pradaxa, no intercurrent afib. Has device interrogation on 03/07/16.  HLD- has been well controlled on Tricor and zocor. Lab Results  Component Value Date   CHOL 154 02/24/2016   HDL 59.90 02/24/2016   LDLCALC 76 02/24/2016   TRIG 89.0 02/24/2016   CHOLHDL 3 02/24/2016   Tobacco abuse- > 35 year pack history. Still smoking but feels he would consider quitting in future. Mom died of lung CA.   Neg low dose screening chest CT in 02/2014. Lab Results  Component Value Date   ALT 20 02/24/2016   AST 27 02/24/2016   ALKPHOS 47 02/24/2016   BILITOT 0.7 02/24/2016   Lab Results  Component Value Date   CREATININE 1.09 02/24/2016   Lab Results  Component Value Date   NA 136 02/24/2016   K 4.3 02/24/2016   CL 101 02/24/2016   CO2 30 02/24/2016   Lab Results  Component Value Date   PSA 0.48 02/24/2016   PSA 0.37 10/20/2014   PSA 0.38 01/27/2014   Current Outpatient Prescriptions on File Prior to Visit  Medication Sig Dispense Refill  . apixaban (ELIQUIS) 5 MG TABS tablet Take 1 tablet (5 mg total) by mouth 2 (two) times daily. 180 tablet 3  . brimonidine (ALPHAGAN) 0.15 % ophthalmic solution Place 1 drop into both eyes 2 (two) times daily.     .  Cholecalciferol (VITAMIN D3) 2000 UNITS capsule Take 2,000 Units by mouth daily.    Marland Kitchen diltiazem (CARDIZEM) 30 MG tablet Take 1 tablet (30 mg total) by mouth 2 (two) times daily as needed. 60 tablet 11  . FOLIC ACID PO Take 1 tablet by mouth daily.    . methotrexate 2.5 MG tablet Take 10 mg by mouth once a week.     No current facility-administered medications on file prior to visit.     Allergies  Allergen Reactions  . Sulfa Antibiotics Hives and Itching  . Penicillins Hives and Palpitations    Past Medical History:  Diagnosis Date  . Chicken pox   . 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 Diverticulitis   . History of stomach ulcers   . Hyperlipidemia   . Lymphocytic colitis   . Orthostatic hypotension   . Pancreatitis   . Paroxysmal atrial fibrillation [facility])     Past Surgical History:  Procedure Laterality Date  . APPENDECTOMY    . BACK SURGERY    . large intestine block    . PACEMAKER INSERTION     MDT dual chamber pacemaker  . TONSILLECTOMY AND ADENOIDECTOMY      Family History  Problem Relation Age of Onset  . Cancer Mother   . Heart disease Father  Social History   Social History  . Marital status: Married    Spouse name: N/A  . Number of children: 2  . Years of education: N/A   Occupational History  . Retired    Social History Main Topics  . Smoking status: Current Every Day Smoker    Packs/day: 0.50    Years: 55.00  . Smokeless tobacco: Never Used  . Alcohol use 0.0 oz/week  . Drug use: No  . Sexual activity: No   Other Topics Concern  . Not on file   Social History Narrative   Married.   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      Does not have a living will.   Desires CPR, does not want prolonged life support if futile.   The PMH, PSH, Social History, Family History, Medications, and allergies have been reviewed in [facility], and have been updated if relevant.     Review of Systems  Constitutional:  Negative.   HENT: Negative.   Eyes: Negative.   Respiratory: Negative.   Cardiovascular: Negative.   Gastrointestinal: Negative.   Endocrine: Negative.   Genitourinary: Negative.   Musculoskeletal: Negative.   Skin: Negative.   Allergic/Immunologic: Negative.   Neurological: Negative.   Hematological: Negative.   Psychiatric/Behavioral: Negative.   All other systems reviewed and are negative.      Objective:    BP (!) 116/54   Pulse 84   Temp 98.2 F (36.8 C) (Oral)   Ht 5' 6.5" (1.689 m)   Wt 162 lb 12 oz (73.8 kg)   SpO2 98%   BMI 25.88 kg/m    Physical Exam  Constitutional: He is oriented to person, place, and time. He appears well-developed and well-nourished. No distress.  HENT:  Head: Normocephalic and atraumatic.  Eyes: Conjunctivae are normal.  Neck: Normal range of motion.  Cardiovascular: Normal rate and regular rhythm.   Pulmonary/Chest: Effort normal. No respiratory distress. He has no wheezes. He has no rales. He exhibits no tenderness.  Musculoskeletal: He exhibits no edema.  Neurological: He is alert and oriented to person, place, and time. No cranial nerve deficit.  Skin: Skin is warm and dry.  Psychiatric: He has a normal mood and affect. His behavior is normal. Judgment and thought content normal.  Nursing note and vitals reviewed.         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

## 2016-02-29 NOTE — Assessment & Plan Note (Signed)
NSR On anticoagulation.

## 2016-02-29 NOTE — Assessment & Plan Note (Signed)
At goal.  

## 2016-02-29 NOTE — Patient Instructions (Signed)
Great to see you. Happy New Year!  We will call you with your lung cancer screening CT.

## 2016-03-01 ENCOUNTER — Telehealth: Payer: Self-pay | Admitting: *Deleted

## 2016-03-01 DIAGNOSIS — Z87891 Personal history of nicotine dependence: Secondary | ICD-10-CM

## 2016-03-01 NOTE — Telephone Encounter (Signed)
Received referral for initial lung cancer screening scan. Contacted patient and obtained smoking history,(current, 57 pack year) as well as answering questions related to screening process. Patient denies signs of lung cancer such as weight loss or hemoptysis. Patient denies comorbidity that would prevent curative treatment if lung cancer were found. Patient is tentatively scheduled for shared decision making visit and CT scan on 03/07/16 at 1:30pm, pending insurance approval from business office.

## 2016-03-07 ENCOUNTER — Ambulatory Visit: Admission: RE | Admit: 2016-03-07 | Payer: Medicare Other | Source: Ambulatory Visit

## 2016-03-07 ENCOUNTER — Encounter: Payer: Self-pay | Admitting: Oncology

## 2016-03-07 ENCOUNTER — Ambulatory Visit (INDEPENDENT_AMBULATORY_CARE_PROVIDER_SITE_OTHER): Payer: Medicare Other | Admitting: *Deleted

## 2016-03-07 ENCOUNTER — Inpatient Hospital Stay: Payer: Medicare Other | Admitting: Oncology

## 2016-03-07 DIAGNOSIS — I480 Paroxysmal atrial fibrillation: Secondary | ICD-10-CM | POA: Diagnosis not present

## 2016-03-07 NOTE — Progress Notes (Signed)
Remote pacemaker transmission.   

## 2016-03-11 LAB — CUP PACEART REMOTE DEVICE CHECK
Battery Impedance: 1174 Ohm
Battery Remaining Longevity: 45 mo
Battery Voltage: 2.76 V
Brady Statistic AP VP Percent: 0 %
Brady Statistic AP VS Percent: 68 %
Brady Statistic AS VP Percent: 0 %
Brady Statistic AS VS Percent: 31 %
Date Time Interrogation Session: 20180116153651
Implantable Lead Implant Date: 19941110
Implantable Lead Implant Date: 19941110
Implantable Lead Location: 753859
Implantable Lead Location: 753860
Implantable Lead Model: 5034
Implantable Lead Model: 5534
Implantable Pulse Generator Implant Date: 20081023
Lead Channel Impedance Value: 1010 Ohm
Lead Channel Impedance Value: 1174 Ohm
Lead Channel Pacing Threshold Amplitude: 0.25 V
Lead Channel Pacing Threshold Pulse Width: 0.4 ms
Lead Channel Sensing Intrinsic Amplitude: 1.4 mV
Lead Channel Sensing Intrinsic Amplitude: 11.2 mV
Lead Channel Setting Pacing Amplitude: 2 V
Lead Channel Setting Pacing Amplitude: 2 V
Lead Channel Setting Pacing Pulse Width: 0.4 ms
Lead Channel Setting Sensing Sensitivity: 5.6 mV

## 2016-03-14 ENCOUNTER — Ambulatory Visit
Admission: RE | Admit: 2016-03-14 | Discharge: 2016-03-14 | Disposition: A | Discharge status: Home / Self Care | Payer: Medicare Other | Source: Ambulatory Visit | Attending: Oncology | Admitting: Oncology

## 2016-03-14 ENCOUNTER — Inpatient Hospital Stay: Payer: Medicare Other | Attending: Oncology | Admitting: Oncology

## 2016-03-14 DIAGNOSIS — J8489 Other specified interstitial pulmonary diseases: Secondary | ICD-10-CM | POA: Diagnosis not present

## 2016-03-14 DIAGNOSIS — I2510 Atherosclerotic heart disease of native coronary artery without angina pectoris: Secondary | ICD-10-CM | POA: Insufficient documentation

## 2016-03-14 DIAGNOSIS — Z122 Encounter for screening for malignant neoplasm of respiratory organs: Secondary | ICD-10-CM

## 2016-03-14 DIAGNOSIS — I700 Atherosclerosis of aorta: Secondary | ICD-10-CM | POA: Diagnosis not present

## 2016-03-14 DIAGNOSIS — Z87891 Personal history of nicotine dependence: Secondary | ICD-10-CM | POA: Diagnosis not present

## 2016-03-14 DIAGNOSIS — F17210 Nicotine dependence, cigarettes, uncomplicated: Secondary | ICD-10-CM | POA: Diagnosis not present

## 2016-03-15 ENCOUNTER — Encounter: Payer: Self-pay | Admitting: Cardiology

## 2016-03-16 ENCOUNTER — Encounter: Payer: Self-pay | Admitting: *Deleted

## 2016-03-17 DIAGNOSIS — Z87891 Personal history of nicotine dependence: Secondary | ICD-10-CM | POA: Insufficient documentation

## 2016-03-17 NOTE — Progress Notes (Signed)
In accordance with CMS guidelines, patient has met eligibility criteria including age, absence of signs or symptoms of lung cancer.  Social History  Substance Use Topics  . Smoking status: Current Every Day Smoker    Packs/day: 1.00    Years: 57.00  . Smokeless tobacco: Never Used  . Alcohol use 0.0 oz/week     A shared decision-making session was conducted prior to the performance of CT scan. This includes one or more decision aids, includes benefits and harms of screening, follow-up diagnostic testing, over-diagnosis, false positive rate, and total radiation exposure.  Counseling on the importance of adherence to annual lung cancer LDCT screening, impact of co-morbidities, and ability or willingness to undergo diagnosis and treatment is imperative for compliance of the program.  Counseling on the importance of continued smoking cessation for former smokers; the importance of smoking cessation for current smokers, and information about tobacco cessation interventions have been given to patient including [place] and [facility] quit [place] programs.  Written order for lung cancer screening with LDCT has been given to the patient and any and all questions have been answered to the best of my abilities.   Yearly follow up will be coordinated by Burgess Estelle, Thoracic Navigator.

## 2016-05-03 ENCOUNTER — Encounter: Payer: Self-pay | Admitting: Family Medicine

## 2016-05-03 ENCOUNTER — Ambulatory Visit (INDEPENDENT_AMBULATORY_CARE_PROVIDER_SITE_OTHER): Payer: Medicare Other | Admitting: Family Medicine

## 2016-05-03 VITALS — BP 110/70 | HR 74 | Temp 98.1°F | Wt 169.0 lb

## 2016-05-03 DIAGNOSIS — Z23 Encounter for immunization: Secondary | ICD-10-CM

## 2016-05-03 DIAGNOSIS — D229 Melanocytic nevi, unspecified: Secondary | ICD-10-CM | POA: Insufficient documentation

## 2016-05-03 MED ORDER — TETANUS-DIPHTHERIA TOXOIDS TD 5-2 LFU IM INJ: 0.5000 mL | INJECTION | Freq: Once | INTRAMUSCULAR | Status: DC

## 2016-05-03 NOTE — Progress Notes (Signed)
Pre visit review using our clinic review tool, if applicable. No additional management support is needed unless otherwise documented below in the visit note. 

## 2016-05-03 NOTE — Assessment & Plan Note (Signed)
Agree with are somewhat concerning- ? SCC  Refer to dermatology for removal/biopsy of both lesions.  The patient indicates understanding of these issues and agrees with the plan.

## 2016-05-03 NOTE — Patient Instructions (Signed)
Great to see you.  We will call you with your dermatology appointment.

## 2016-05-03 NOTE — Progress Notes (Signed)
Subjective:   Patient ID: Darryl White, male    DOB: [birth year], 76 y.o.   MRN: [id number]  ICHAEL PULLARA is a pleasant 76 y.o. year old male who presents to clinic today with Skin Lesions (One on the left arm and one on his head. ) and Tetanus (Needs rx to pharmacy for tetabus vaccine)  on 05/03/2016  HPI:   Has two moles that he thinks are changing.  One on his scalp- used to be flat, more regular appearing. Now raised and larger- feels it has been there for at least a year. Not bleeding or irritated.  "red"mole on his left arm- growing in size over a year, feels it is does kind of "scab up or bubble up" sometimes.  Current Outpatient Prescriptions on File Prior to Visit  Medication Sig Dispense Refill  . apixaban (ELIQUIS) 5 MG TABS tablet Take 1 tablet (5 mg total) by mouth 2 (two) times daily. 180 tablet 3  . brimonidine (ALPHAGAN) 0.15 % ophthalmic solution Place 1 drop into both eyes 2 (two) times daily.     . Cholecalciferol (VITAMIN D3) 2000 UNITS capsule Take 2,000 Units by mouth daily.    Marland Kitchen diltiazem (CARDIZEM) 30 MG tablet Take 1 tablet (30 mg total) by mouth 2 (two) times daily as needed. 60 tablet 11  . fenofibrate (TRICOR) 145 MG tablet Take 1 tablet (145 mg total) by mouth daily. 90 tablet 2  . FOLIC ACID PO Take 1 tablet by mouth daily.    . methotrexate 2.5 MG tablet Take 12.5 mg by mouth once a week.      No current facility-administered medications on file prior to visit.     Allergies  Allergen Reactions  . Sulfa Antibiotics Hives and Itching  . Penicillins Hives and Palpitations    Past Medical History:  Diagnosis Date  . Chicken pox   . 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 Diverticulitis   . History of stomach ulcers   . Hyperlipidemia   . Lymphocytic colitis   . Orthostatic hypotension   . Pancreatitis   . Paroxysmal atrial fibrillation [facility])     Past Surgical History:  Procedure Laterality Date  . APPENDECTOMY      . BACK SURGERY    . large intestine block    . PACEMAKER INSERTION     MDT dual chamber pacemaker  . TONSILLECTOMY AND ADENOIDECTOMY      Family History  Problem Relation Age of Onset  . Cancer Mother   . Heart disease Father     Social History   Social History  . Marital status: Married    Spouse name: N/A  . Number of children: 2  . Years of education: N/A   Occupational History  . Retired    Social History Main Topics  . Smoking status: Current Every Day Smoker    Packs/day: 1.00    Years: 57.00  . Smokeless tobacco: Never Used  . Alcohol use 0.0 oz/week  . Drug use: No  . Sexual activity: No   Other Topics Concern  . Not on file   Social History Narrative   Married.   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      Does not have a living will.   Desires CPR, does not want prolonged life support if futile.   The PMH, PSH, Social History, Family History, Medications, and allergies have been reviewed in [facility], and have been  updated if relevant.   Review of Systems  Skin: Positive for color change.  All other systems reviewed and are negative.      Objective:    BP 110/70 (BP Location: Left Arm, Patient Position: Sitting, Cuff Size: Normal)   Pulse 74   Temp 98.1 F (36.7 C) (Oral)   Wt 169 lb (76.7 kg)   SpO2 98%   BMI 27.28 kg/m    Physical Exam  Constitutional: He is oriented to person, place, and time. He appears well-developed and well-nourished. No distress.  HENT:  Head: Normocephalic and atraumatic.  Eyes: Conjunctivae are normal.  Cardiovascular: Normal rate.   Pulmonary/Chest: Effort normal.  Musculoskeletal: Normal range of motion.  Neurological: He is alert and oriented to person, place, and time. No cranial nerve deficit.  Skin: He is not diaphoretic.  Area on scalp- >1 cm, raised, brown  Left arm- .5 cm, irregular salmon colored, flat lesion  Psychiatric: He has a normal mood and affect. His behavior is normal.  Judgment and thought content normal.  Nursing note and vitals reviewed.         Assessment & Plan:   Change in multiple nevi - Plan: Ambulatory referral to Dermatology  Need for tetanus booster - Plan: DISCONTINUED: tetanus & diphtheria toxoids (adult) (TENIVAC) injection 0.5 mL No Follow-up on file.

## 2016-05-04 ENCOUNTER — Encounter: Payer: Self-pay | Admitting: Family Medicine

## 2016-06-06 ENCOUNTER — Ambulatory Visit (INDEPENDENT_AMBULATORY_CARE_PROVIDER_SITE_OTHER): Payer: Medicare Other | Admitting: *Deleted

## 2016-06-06 DIAGNOSIS — I480 Paroxysmal atrial fibrillation: Secondary | ICD-10-CM | POA: Diagnosis not present

## 2016-06-06 NOTE — Progress Notes (Signed)
Remote pacemaker transmission.   

## 2016-06-09 ENCOUNTER — Encounter: Payer: Self-pay | Admitting: Cardiology

## 2016-06-15 LAB — CUP PACEART REMOTE DEVICE CHECK
Battery Impedance: 1283 Ohm
Battery Remaining Longevity: 43 mo
Battery Voltage: 2.76 V
Brady Statistic AP VP Percent: 0 %
Brady Statistic AP VS Percent: 71 %
Brady Statistic AS VP Percent: 0 %
Brady Statistic AS VS Percent: 29 %
Date Time Interrogation Session: 20180417115258
Implantable Lead Implant Date: 19941110
Implantable Lead Implant Date: 19941110
Implantable Lead Location: 753859
Implantable Lead Location: 753860
Implantable Lead Model: 5034
Implantable Lead Model: 5534
Implantable Pulse Generator Implant Date: 20081023
Lead Channel Impedance Value: 1010 Ohm
Lead Channel Impedance Value: 1170 Ohm
Lead Channel Pacing Threshold Amplitude: 0.375 V
Lead Channel Pacing Threshold Pulse Width: 0.4 ms
Lead Channel Setting Pacing Amplitude: 2 V
Lead Channel Setting Pacing Amplitude: 2 V
Lead Channel Setting Pacing Pulse Width: 0.4 ms
Lead Channel Setting Sensing Sensitivity: 5.6 mV

## 2016-06-19 ENCOUNTER — Ambulatory Visit (INDEPENDENT_AMBULATORY_CARE_PROVIDER_SITE_OTHER): Payer: Medicare Other | Admitting: Family Medicine

## 2016-06-19 ENCOUNTER — Encounter: Payer: Self-pay | Admitting: Family Medicine

## 2016-06-19 DIAGNOSIS — K5792 Diverticulitis of intestine, part unspecified, without perforation or abscess without bleeding: Secondary | ICD-10-CM | POA: Diagnosis not present

## 2016-06-19 DIAGNOSIS — C439 Malignant melanoma of skin, unspecified: Secondary | ICD-10-CM

## 2016-06-19 NOTE — Assessment & Plan Note (Signed)
New- symptoms resolving. Finish course of abx. Call or return to clinic prn if these symptoms worsen or fail to improve as anticipated. The patient indicates understanding of these issues and agrees with the plan.

## 2016-06-19 NOTE — Patient Instructions (Signed)
Great to see you. Have a great trip! 

## 2016-06-19 NOTE — Progress Notes (Signed)
Subjective:   Patient ID: Darryl White, male    DOB: 08-02-1940, 76 y.o.   MRN: [id number]  BEXTON HAAK is a pleasant 76 y.o. 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  Went to [facility].  We do not yet have these records. Per pt, based on history of diverticulitis s/p partial colectomy, exam (included a temp of 101), diagnosed with acute diverticulitis.  Treated with cipro and flagyl and already feels much better. Fever resolved.  Pain improved- now "just sore."  Never had diarrhea, nausea or vomiting.   Colonoscopy UTD- 06/2015.   Skin cancer- saw Dr. Vernie Murders PA.  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 Scheduled for surgery in June (leaves next weekend for month long road trip).    Current Outpatient Prescriptions on File Prior to Visit  Medication Sig Dispense Refill  . apixaban (ELIQUIS) 5 MG TABS tablet Take 1 tablet (5 mg total) by mouth 2 (two) times daily. 180 tablet 3  . brimonidine (ALPHAGAN) 0.15 % ophthalmic solution Place 1 drop into both eyes 2 (two) times daily.     . Cholecalciferol (VITAMIN D3) 2000 UNITS capsule Take 2,000 Units by mouth daily.    Marland Kitchen diltiazem (CARDIZEM) 30 MG tablet Take 1 tablet (30 mg total) by mouth 2 (two) times daily as needed. 60 tablet 11  . fenofibrate (TRICOR) 145 MG tablet Take 1 tablet (145 mg total) by mouth daily. 90 tablet 2  . FOLIC ACID PO Take 1 tablet by mouth daily.    . methotrexate 2.5 MG tablet Take 12.5 mg by mouth once a week.      No current facility-administered medications on file prior to visit.     Allergies  Allergen Reactions  . Sulfa Antibiotics Hives and Itching  . Penicillins Hives and Palpitations    Past Medical History:   Diagnosis Date  . Chicken pox   . 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 Diverticulitis   . History of stomach ulcers   . Hyperlipidemia   . Lymphocytic colitis   . Orthostatic hypotension   . Pancreatitis   . Paroxysmal atrial fibrillation [facility])     Past Surgical History:  Procedure Laterality Date  . APPENDECTOMY    . BACK SURGERY    . large intestine block    . PACEMAKER INSERTION     MDT dual chamber pacemaker  . TONSILLECTOMY AND ADENOIDECTOMY      Family History  Problem Relation Age of Onset  . Cancer Mother   . Heart disease Father     Social History   Social History  . Marital status: Married    Spouse name: N/A  . Number of children: 2  . Years of education: N/A   Occupational History  . Retired    Social History Main Topics  . Smoking status: Current Every Day Smoker    Packs/day: 1.00    Years: 57.00  . Smokeless tobacco: Never Used  . Alcohol use 0.0 oz/week  . Drug use: No  . Sexual activity: No   Other Topics Concern  . Not on file   Social History Narrative   Married.  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      Does not have a living will.   Desires CPR, does not want prolonged life support if futile.   The PMH, PSH, Social History, Family History, Medications, and allergies have been reviewed in [facility], and have been updated if relevant.   Review of Systems  Constitutional: Negative.   HENT: Negative.   Gastrointestinal: Positive for abdominal pain. Negative for abdominal distention, anal bleeding, blood in stool, constipation, diarrhea, nausea, rectal pain and vomiting.  Musculoskeletal: Negative.   Allergic/Immunologic: Negative.   Neurological: Negative.   Psychiatric/Behavioral: Negative.   All other systems reviewed and are negative.      Objective:    BP 100/70 (BP Location: Left Arm, Patient Position: Sitting, Cuff Size: Normal)   Pulse 87   Temp 98.4 F (36.9 C) (Oral)   Wt 164  lb (74.4 kg)   SpO2 94%   BMI 26.47 kg/m    Physical Exam  General:  pleasant male in no acute distress Eyes:  PERRL Ears:  External ear exam shows no significant lesions or deformities.  TMs normal bilaterally Hearing is grossly normal bilaterally. Nose:  External nasal examination shows no deformity or inflammation. Nasal mucosa are pink and moist without lesions or exudates. Mouth:  Oral mucosa and oropharynx without lesions or exudates.  Teeth in good repair. Neck:  no carotid bruit or thyromegaly no cervical or supraclavicular lymphadenopathy  Lungs:  Normal respiratory effort, chest expands symmetrically. Lungs are clear to auscultation, no crackles or wheezes. Heart:  Normal rate and regular rhythm. S1 and S2 normal without gallop, murmur, click, rub or other extra sounds. Abdomen:  Bowel sounds positive,abdomen soft and non-tender without masses, organomegaly or hernias noted. Pulses:  R and L posterior tibial pulses are full and equal bilaterally  Extremities:  no edema  Psych:  Good eye contact, not anxious or depressed appearing Skin- area behind left ear- biopsy site, healing well, no signs of infection or induration.       Assessment & Plan:   Diverticulitis  Skin cancer (melanoma) ([place]) No Follow-up on file.

## 2016-06-19 NOTE — Assessment & Plan Note (Signed)
Scheduled for surgical incision in June.

## 2016-06-19 NOTE — Progress Notes (Signed)
Pre visit review using our clinic review tool, if applicable. No additional management support is needed unless otherwise documented below in the visit note. 

## 2016-06-22 ENCOUNTER — Other Ambulatory Visit: Payer: Self-pay | Admitting: *Deleted

## 2016-06-22 ENCOUNTER — Telehealth: Payer: Self-pay | Admitting: Internal Medicine

## 2016-06-22 DIAGNOSIS — I480 Paroxysmal atrial fibrillation: Secondary | ICD-10-CM

## 2016-06-22 MED ORDER — APIXABAN 5 MG PO TABS: 5.0000 mg | ORAL_TABLET | Freq: Two times a day (BID) | ORAL | 3 refills | Status: DC

## 2016-06-22 NOTE — Telephone Encounter (Signed)
Eliquis #180 R#3 sent to [facility].

## 2016-06-22 NOTE — Telephone Encounter (Signed)
°*  STAT* If patient is at the pharmacy, call can be transferred to refill team.   1. Which medications need to be refilled? (please list name of each medication and dose if known) eliquis 5 mg 2x a day   2. Which pharmacy/location (including street and city if local pharmacy) is medication to be sent to?[facility]   3. Do they need a 30 day or 90 day supply? 90 day

## 2016-08-16 DIAGNOSIS — D0322 Melanoma in situ of left ear and external auricular canal: Secondary | ICD-10-CM | POA: Insufficient documentation

## 2016-09-05 ENCOUNTER — Ambulatory Visit (INDEPENDENT_AMBULATORY_CARE_PROVIDER_SITE_OTHER): Payer: Medicare Other | Admitting: *Deleted

## 2016-09-05 DIAGNOSIS — I480 Paroxysmal atrial fibrillation: Secondary | ICD-10-CM

## 2016-09-05 NOTE — Progress Notes (Signed)
Remote pacemaker transmission.   

## 2016-09-06 ENCOUNTER — Encounter: Payer: Self-pay | Admitting: Cardiology

## 2016-09-07 LAB — CUP PACEART REMOTE DEVICE CHECK
Battery Impedance: 1364 Ohm
Battery Remaining Longevity: 41 mo
Battery Voltage: 2.76 V
Brady Statistic AP VP Percent: 0 %
Brady Statistic AP VS Percent: 70 %
Brady Statistic AS VP Percent: 0 %
Brady Statistic AS VS Percent: 30 %
Date Time Interrogation Session: 20180717105329
Implantable Lead Implant Date: 19941110
Implantable Lead Implant Date: 19941110
Implantable Lead Location: 753859
Implantable Lead Location: 753860
Implantable Lead Model: 5034
Implantable Lead Model: 5534
Implantable Pulse Generator Implant Date: 20081023
Lead Channel Impedance Value: 1114 Ohm
Lead Channel Impedance Value: 1117 Ohm
Lead Channel Pacing Threshold Amplitude: 0.25 V
Lead Channel Pacing Threshold Pulse Width: 0.4 ms
Lead Channel Setting Pacing Amplitude: 2 V
Lead Channel Setting Pacing Amplitude: 2 V
Lead Channel Setting Pacing Pulse Width: 0.4 ms
Lead Channel Setting Sensing Sensitivity: 5.6 mV

## 2016-11-07 ENCOUNTER — Ambulatory Visit (INDEPENDENT_AMBULATORY_CARE_PROVIDER_SITE_OTHER): Payer: Medicare Other | Admitting: Internal Medicine

## 2016-11-07 ENCOUNTER — Encounter: Payer: Self-pay | Admitting: Internal Medicine

## 2016-11-07 VITALS — BP 142/91 | HR 76 | Ht 66.0 in | Wt 162.2 lb

## 2016-11-07 DIAGNOSIS — I480 Paroxysmal atrial fibrillation: Secondary | ICD-10-CM | POA: Diagnosis not present

## 2016-11-07 DIAGNOSIS — Z950 Presence of cardiac pacemaker: Secondary | ICD-10-CM

## 2016-11-07 DIAGNOSIS — R55 Syncope and collapse: Secondary | ICD-10-CM | POA: Diagnosis not present

## 2016-11-07 LAB — CUP PACEART INCLINIC DEVICE CHECK
Battery Impedance: 1392 Ohm
Battery Remaining Longevity: 40 mo
Battery Voltage: 2.76 V
Brady Statistic AP VP Percent: 0 %
Brady Statistic AP VS Percent: 71 %
Brady Statistic AS VP Percent: 0 %
Brady Statistic AS VS Percent: 28 %
Date Time Interrogation Session: 20180918140934
Implantable Lead Implant Date: 19941110
Implantable Lead Implant Date: 19941110
Implantable Lead Location: 753859
Implantable Lead Location: 753860
Implantable Lead Model: 5034
Implantable Lead Model: 5534
Implantable Pulse Generator Implant Date: 20081023
Lead Channel Impedance Value: 1117 Ohm
Lead Channel Impedance Value: 946 Ohm
Lead Channel Pacing Threshold Amplitude: 0.5 V
Lead Channel Pacing Threshold Amplitude: 0.5 V
Lead Channel Pacing Threshold Pulse Width: 0.4 ms
Lead Channel Pacing Threshold Pulse Width: 0.4 ms
Lead Channel Sensing Intrinsic Amplitude: 11.2 mV
Lead Channel Sensing Intrinsic Amplitude: 2 mV
Lead Channel Setting Pacing Amplitude: 2 V
Lead Channel Setting Pacing Amplitude: 2 V
Lead Channel Setting Pacing Pulse Width: 0.4 ms
Lead Channel Setting Sensing Sensitivity: 5.6 mV

## 2016-11-07 NOTE — Patient Instructions (Signed)
Medication Instructions: - Your physician recommends that you continue on your current medications as directed. Please refer to the Current Medication list given to you today.  Labwork: - none ordered  Procedures/Testing: - none ordered  Follow-Up: - Remote monitoring is used to monitor your Pacemaker of ICD from home. This monitoring reduces the number of office visits required to check your device to one time per year. It allows [place] to keep an eye on the functioning of your device to ensure it is working properly. You are scheduled for a device check from home on 12/05/16. You may send your transmission at any time that day. If you have a wireless device, the transmission will be sent automatically. After your physician reviews your transmission, you will receive a postcard with your next transmission date.  - Your physician wants you to follow-up in: 1 year with [name].  You will receive a reminder letter in the mail two months in advance. If you don't receive a letter, please call our office to schedule the follow-up appointment.   Any Additional Special Instructions Will Be Listed Below (If Applicable).     If you need a refill on your cardiac medications before your next appointment, please call your pharmacy.

## 2016-11-07 NOTE — Progress Notes (Signed)
Patient Care Team: Lucille Passy, MD as PCP - General (Family Medicine) Constance Haw, MD as Consulting Physician (Cardiology) Deboraha Sprang, MD as Consulting Physician (Cardiology) Karren Burly Deirdre Peer, MD as Referring Physician (Ophthalmology) Marlowe Sax, MD as Referring Physician (Internal Medicine)   HPI  Darryl White is a 76 y.o. male Seen to establish followup.  Prev seen at [facility] for  PM and OI associated with syncope  New episode AFib>> [facility] 8/16 and converted spontanously   He had atrial undersensing resulting in failure to mode switch and tracking at the upper rate limit\  He is anticoagulated with apixoban; without bleeding  The patient denies chest pain, shortness of breath, nocturnal dyspnea, orthopnea or peripheral edema.  There have been no palpitations, lightheadedness or syncope.    He has hx of PM implanted initially 20 years ago for syncope. He has had no recurrent syncope.  This was presumably neurally mediated by the family's description.      Date Cr Hgb  6/18 1.1 14.9        Thromboembolic risk factors ( age  -2) for a CHADSVASc Score of 2Oh   Myoview 8/16 EF 65% No ischemia   Past Medical History:  Diagnosis Date  . Chicken pox   . 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 Diverticulitis   . History of stomach ulcers   . Hyperlipidemia   . Lymphocytic colitis   . Orthostatic hypotension   . Pancreatitis   . Paroxysmal atrial fibrillation [facility])     Past Surgical History:  Procedure Laterality Date  . APPENDECTOMY    . BACK SURGERY    . large intestine block    . PACEMAKER INSERTION     MDT dual chamber pacemaker  . TONSILLECTOMY AND ADENOIDECTOMY      Current Outpatient Prescriptions  Medication Sig Dispense Refill  . apixaban (ELIQUIS) 5 MG TABS tablet Take 1 tablet (5 mg total) by mouth 2 (two) times daily. 180 tablet 3  . brimonidine (ALPHAGAN) 0.15 % ophthalmic solution Place 1  drop into both eyes 2 (two) times daily.     . Cholecalciferol (VITAMIN D3) 2000 UNITS capsule Take 2,000 Units by mouth daily.    Marland Kitchen diltiazem (CARDIZEM) 30 MG tablet Take 1 tablet (30 mg total) by mouth 2 (two) times daily as needed. 60 tablet 11  . fenofibrate (TRICOR) 145 MG tablet Take 1 tablet (145 mg total) by mouth daily. 90 tablet 2  . FOLIC ACID PO Take 1 tablet by mouth daily.    . methotrexate 2.5 MG tablet Take 12.5 mg by mouth once a week.      No current facility-administered medications for this visit.     Allergies  Allergen Reactions  . Sulfa Antibiotics Hives and Itching  . Penicillins Hives and Palpitations      Review of Systems negative except from HPI and PMH  Physical Exam BP (!) 142/91 (BP Location: Left Arm, Patient Position: Sitting, Cuff Size: Normal)   Pulse 76   Ht 5\' 6"  (1.676 m)   Wt 162 lb 4 oz (73.6 kg)   BMI 26.19 kg/m  Well developed and nourished in no acute distress HENT normal Neck supple with JVP-flat Clear Device pocket well healed; without hematoma or erythema.  There is no tethering  Regular rate and rhythm, no murmurs or gallops Abd-soft with active BS No Clubbing cyanosis edema Skin-warm and dry A &  Oriented  Grossly normal sensory and motor function   ECG demonstrates atrial pacing at 76 20/08/36 Ow normal   Assessment and  Plan  Syncope-neurally mediated  Labile hypertension and orthostatic hypotension  Pacemaker-Medtronic  The patient's device was interrogated.  The information was reviewed. No changes were made in the programming.     Atrial fibrillation-paroxysmal  CHADS-VASc score-2  No intercurrent atrial fibrillation or flutter  On Anticoagulation;  No bleeding issues   No syncope

## 2016-12-05 ENCOUNTER — Other Ambulatory Visit: Payer: Self-pay | Admitting: Family Medicine

## 2016-12-05 ENCOUNTER — Ambulatory Visit (INDEPENDENT_AMBULATORY_CARE_PROVIDER_SITE_OTHER): Payer: Medicare Other | Admitting: *Deleted

## 2016-12-05 DIAGNOSIS — R55 Syncope and collapse: Secondary | ICD-10-CM | POA: Diagnosis not present

## 2016-12-05 DIAGNOSIS — Z950 Presence of cardiac pacemaker: Secondary | ICD-10-CM

## 2016-12-05 DIAGNOSIS — I480 Paroxysmal atrial fibrillation: Secondary | ICD-10-CM

## 2016-12-07 NOTE — Progress Notes (Signed)
Remote pacemaker transmission.   

## 2016-12-08 ENCOUNTER — Encounter: Payer: Self-pay | Admitting: Cardiology

## 2016-12-14 ENCOUNTER — Encounter: Payer: Medicare Other | Admitting: Internal Medicine

## 2016-12-15 ENCOUNTER — Ambulatory Visit (INDEPENDENT_AMBULATORY_CARE_PROVIDER_SITE_OTHER): Payer: Medicare Other | Admitting: Primary Care

## 2016-12-15 ENCOUNTER — Encounter: Payer: Self-pay | Admitting: Primary Care

## 2016-12-15 VITALS — BP 118/74 | HR 78 | Temp 98.3°F | Ht 66.0 in | Wt 164.4 lb

## 2016-12-15 DIAGNOSIS — E781 Pure hyperglyceridemia: Secondary | ICD-10-CM

## 2016-12-15 DIAGNOSIS — Z23 Encounter for immunization: Secondary | ICD-10-CM | POA: Diagnosis not present

## 2016-12-15 DIAGNOSIS — M05749 Rheumatoid arthritis with rheumatoid factor of unspecified hand without organ or systems involvement: Secondary | ICD-10-CM | POA: Diagnosis not present

## 2016-12-15 DIAGNOSIS — Z950 Presence of cardiac pacemaker: Secondary | ICD-10-CM | POA: Diagnosis not present

## 2016-12-15 DIAGNOSIS — I480 Paroxysmal atrial fibrillation: Secondary | ICD-10-CM | POA: Diagnosis not present

## 2016-12-15 DIAGNOSIS — C439 Malignant melanoma of skin, unspecified: Secondary | ICD-10-CM | POA: Diagnosis not present

## 2016-12-15 DIAGNOSIS — E785 Hyperlipidemia, unspecified: Secondary | ICD-10-CM

## 2016-12-15 NOTE — Assessment & Plan Note (Signed)
Following with Dermatology every 3 months.

## 2016-12-15 NOTE — Assessment & Plan Note (Signed)
Following with Rheumatology, continue methotrexate 5 mg.

## 2016-12-15 NOTE — Assessment & Plan Note (Signed)
Stable on recent labs from January 2018, continue fenofibrate. Will repeat lipids at CPE in January 2019.

## 2016-12-15 NOTE — Progress Notes (Signed)
Subjective:    Patient ID: Darryl White, male    DOB: [birth year], 76 y.o.   MRN: [id number]  HPI  Darryl White is a 76 year old male who presents today to transfer care from Dr. Deborra Medina. His last CPE was in January 2018.   1) Atrial Fibrillation: Currently managed on apixaban 5 mg BID and diltiazem 30 mg. Also with pacemaker (2nd pacemaker). Currently following with [name] through cardiology, last visit one month ago. He denies chest pain, palpitations.   2) Rheumatoid Arthritis: Currently managed on methotrexate 5 mg once weekly. Currently following with Dr. Berkley Harvey through Rheumatology. He feels well managed.   3) Hyperlipidemia: Currently managed on fenofibrate 145 mg. Lipid panel in January 2018 with TC of 154, LDL of 76, Trigs of 89.   Review of Systems  Constitutional: Negative for fatigue.  Eyes: Negative for visual disturbance.  Respiratory: Negative for shortness of breath.   Cardiovascular: Negative for chest pain.  Skin:       Denies new lesions  Neurological: Negative for dizziness and headaches.       Past Medical History:  Diagnosis Date  . Chicken pox   . 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 Diverticulitis   . History of stomach ulcers   . Hyperlipidemia   . Lymphocytic colitis   . Orthostatic hypotension   . Pancreatitis   . Paroxysmal atrial fibrillation [facility])      Social History   Social History  . Marital status: Married    Spouse name: N/A  . Number of children: 2  . Years of education: N/A   Occupational History  . Retired    Social History Main Topics  . Smoking status: Current Every Day Smoker    Packs/day: 1.00    Years: 57.00  . Smokeless tobacco: Never Used  . Alcohol use 0.0 oz/week  . Drug use: No  . Sexual activity: No   Other Topics Concern  . Not on file   Social History Narrative   Married.   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      Does not have a living will.   Desires CPR, does  not want prolonged life support if futile.    Past Surgical History:  Procedure Laterality Date  . APPENDECTOMY    . BACK SURGERY    . large intestine block    . PACEMAKER INSERTION     MDT dual chamber pacemaker  . TONSILLECTOMY AND ADENOIDECTOMY      Family History  Problem Relation Age of Onset  . Cancer Mother   . Heart disease Father     Allergies  Allergen Reactions  . Sulfa Antibiotics Hives and Itching  . Penicillins Hives and Palpitations    Current Outpatient Prescriptions on File Prior to Visit  Medication Sig Dispense Refill  . apixaban (ELIQUIS) 5 MG TABS tablet Take 1 tablet (5 mg total) by mouth 2 (two) times daily. 180 tablet 3  . brimonidine (ALPHAGAN) 0.15 % ophthalmic solution Place 1 drop into both eyes 2 (two) times daily.     . Cholecalciferol (VITAMIN D3) 2000 UNITS capsule Take 2,000 Units by mouth daily.    Marland Kitchen diltiazem (CARDIZEM) 30 MG tablet Take 1 tablet (30 mg total) by mouth 2 (two) times daily as needed. 60 tablet 11  . fenofibrate (TRICOR) 145 MG tablet take 1 tablet by mouth once daily 90 tablet 0  . FOLIC ACID PO  Take 1 tablet by mouth daily.    . methotrexate 2.5 MG tablet Take 12.5 mg by mouth once a week.      No current facility-administered medications on file prior to visit.     BP 118/74   Pulse 78   Temp 98.3 F (36.8 C) (Oral)   Ht 5\' 6"  (1.676 m)   Wt 164 lb 6.4 oz (74.6 kg)   SpO2 99%   BMI 26.53 kg/m    Objective:   Physical Exam  Constitutional: He is oriented to person, place, and time. He appears well-nourished.  Neck: Neck supple.  Cardiovascular: Normal rate and regular rhythm.   Pulmonary/Chest: Effort normal and breath sounds normal. He has no wheezes. He has no rales.  Neurological: He is alert and oriented to person, place, and time.  Skin: Skin is warm and dry.  Psychiatric: He has a normal mood and affect.          Assessment & Plan:

## 2016-12-15 NOTE — Patient Instructions (Signed)
Please schedule a physical with me in January 2019. You may also schedule a lab only appointment 3-4 days prior. We will discuss your lab results in detail during your physical.  It was a pleasure to see you today!

## 2016-12-15 NOTE — Assessment & Plan Note (Signed)
Following with cardiology. Rate and rhythm regular today.

## 2016-12-15 NOTE — Addendum Note (Signed)
Addended by: Jacqualin Combes on: 12/15/2016 05:07 PM   Modules accepted: Orders

## 2016-12-15 NOTE — Assessment & Plan Note (Signed)
Stable on labs from January 2018, continue fenofibrate.

## 2016-12-15 NOTE — Assessment & Plan Note (Signed)
Following with cardiology, continue apixaban, diltiazem.

## 2017-02-16 ENCOUNTER — Other Ambulatory Visit: Payer: Self-pay | Admitting: Primary Care

## 2017-02-16 DIAGNOSIS — E785 Hyperlipidemia, unspecified: Secondary | ICD-10-CM

## 2017-02-20 DIAGNOSIS — M1990 Unspecified osteoarthritis, unspecified site: Secondary | ICD-10-CM

## 2017-02-20 DIAGNOSIS — H269 Unspecified cataract: Secondary | ICD-10-CM

## 2017-02-20 HISTORY — DX: Unspecified osteoarthritis, unspecified site: M19.90

## 2017-02-20 HISTORY — DX: Unspecified cataract: H26.9

## 2017-02-22 ENCOUNTER — Ambulatory Visit (INDEPENDENT_AMBULATORY_CARE_PROVIDER_SITE_OTHER): Payer: Medicare Other

## 2017-02-22 VITALS — BP 106/82 | HR 70 | Temp 98.6°F | Ht 66.25 in | Wt 162.5 lb

## 2017-02-22 DIAGNOSIS — Z0000 Encounter for general adult medical examination without abnormal findings: Secondary | ICD-10-CM

## 2017-02-22 DIAGNOSIS — E785 Hyperlipidemia, unspecified: Secondary | ICD-10-CM | POA: Diagnosis not present

## 2017-02-22 LAB — COMPREHENSIVE METABOLIC PANEL
ALT: 20 U/L (ref 0–53)
AST: 26 U/L (ref 0–37)
Albumin: 4.3 g/dL (ref 3.5–5.2)
Alkaline Phosphatase: 49 U/L (ref 39–117)
BUN: 21 mg/dL (ref 6–23)
CO2: 25 mEq/L (ref 19–32)
Calcium: 9.3 mg/dL (ref 8.4–10.5)
Chloride: 103 mEq/L (ref 96–112)
Creatinine, Ser: 1.14 mg/dL (ref 0.40–1.50)
GFR: 66.35 mL/min (ref >60.00)
Glucose, Bld: 96 mg/dL (ref 70–99)
Potassium: 4.4 mEq/L (ref 3.5–5.1)
Sodium: 135 mEq/L (ref 135–145)
Total Bilirubin: 0.7 mg/dL (ref 0.2–1.2)
Total Protein: 7 g/dL (ref 6.0–8.3)

## 2017-02-22 LAB — LIPID PANEL
Cholesterol: 132 mg/dL (ref 0–200)
HDL: 56.1 mg/dL (ref >39.00)
LDL Cholesterol: 63 mg/dL (ref 0–99)
NonHDL: 76.01
Total CHOL/HDL Ratio: 2
Triglycerides: 65 mg/dL (ref 0.0–149.0)
VLDL: 13 mg/dL (ref 0.0–40.0)

## 2017-02-22 NOTE — Patient Instructions (Signed)
Mr. Darryl White , Thank you for taking time to come for your Medicare Wellness Visit. I appreciate your ongoing commitment to your health goals. Please review the following plan we discussed and let me know if I can assist you in the future.   These are the goals we discussed: Goals    . Increase physical activity     Starting 02/22/17, I will continue to walk at least 20 minutes daily.        This is a list of the screening recommended for you and due dates:  Health Maintenance  Topic Date Due  . Colon Cancer Screening  07/05/2020  . Tetanus Vaccine  05/05/2026  . Flu Shot  Completed  . Pneumonia vaccines  Completed   Preventive Care for Adults  A healthy lifestyle and preventive care can promote health and wellness. Preventive health guidelines for adults include the following key practices.  . A routine yearly physical is a good way to check with your health care provider about your health and preventive screening. It is a chance to share any concerns and updates on your health and to receive a thorough exam.  . Visit your dentist for a routine exam and preventive care every 6 months. Brush your teeth twice a day and floss once a day. Good oral hygiene prevents tooth decay and gum disease.  . The frequency of eye exams is based on your age, health, family medical history, use  of contact lenses, and other factors. Follow your health care provider's recommendations for frequency of eye exams.  . Eat a healthy diet. Foods like vegetables, fruits, whole grains, low-fat dairy products, and lean protein foods contain the nutrients you need without too many calories. Decrease your intake of foods high in solid fats, added sugars, and salt. Eat the right amount of calories for you. Get information about a proper diet from your health care provider, if necessary.  . Regular physical exercise is one of the most important things you can do for your health. Most adults should get at least 150  minutes of moderate-intensity exercise (any activity that increases your heart rate and causes you to sweat) each week. In addition, most adults need muscle-strengthening exercises on 2 or more days a week.  Silver Sneakers may be a benefit available to you. To determine eligibility, you may visit the website: www.silversneakers.com or contact program at 854 649 2084 Mon-Fri between 8AM-8PM.   . Maintain a healthy weight. The body mass index (BMI) is a screening tool to identify possible weight problems. It provides an estimate of body fat based on height and weight. Your health care provider can find your BMI and can help you achieve or maintain a healthy weight.   For adults 20 years and older: ? A BMI below 18.5 is considered underweight. ? A BMI of 18.5 to 24.9 is normal. ? A BMI of 25 to 29.9 is considered overweight. ? A BMI of 30 and above is considered obese.   . Maintain normal blood lipids and cholesterol levels by exercising and minimizing your intake of saturated fat. Eat a balanced diet with plenty of fruit and vegetables. Blood tests for lipids and cholesterol should begin at age 5 and be repeated every 5 years. If your lipid or cholesterol levels are high, you are over 50, or you are at high risk for heart disease, you may need your cholesterol levels checked more frequently. Ongoing high lipid and cholesterol levels should be treated with medicines if diet and  exercise are not working.  . If you smoke, find out from your health care provider how to quit. If you do not use tobacco, please do not start.  . If you choose to drink alcohol, please do not consume more than 2 drinks per day. One drink is considered to be 12 ounces (355 mL) of beer, 5 ounces (148 mL) of wine, or 1.5 ounces (44 mL) of liquor.  . If you are 42-65 years old, ask your health care provider if you should take aspirin to prevent strokes.  . Use sunscreen. Apply sunscreen liberally and repeatedly throughout  the day. You should seek shade when your shadow is shorter than you. Protect yourself by wearing long sleeves, pants, a wide-brimmed hat, and sunglasses year round, whenever you are outdoors.  . Once a month, do a whole body skin exam, using a mirror to look at the skin on your back. Tell your health care provider of new moles, moles that have irregular borders, moles that are larger than a pencil eraser, or moles that have changed in shape or color.

## 2017-02-22 NOTE — Progress Notes (Signed)
Subjective:   Darryl White is a 77 y.o. male who presents for Medicare Annual/Subsequent preventive examination.  Review of Systems:  N/A Cardiac Risk Factors include: advanced age (>3men, >10 women);dyslipidemia;male gender;smoking/ tobacco exposure     Objective:    Vitals: BP 106/82 (BP Location: Right Arm, Patient Position: Sitting, Cuff Size: Normal)   Pulse 70   Temp 98.6 F (37 C) (Oral)   Ht 5' 6.25" (1.683 m) Comment: no shoes  Wt 162 lb 8 oz (73.7 kg)   SpO2 94%   BMI 26.03 kg/m   Body mass index is 26.03 kg/m.  Advanced Directives 02/22/2017 02/08/2016 07/06/2015 10/13/2014  Does Patient Have a Medical Advance Directive? No No Yes Yes  Type of Advance Directive - - - Living will  Does patient want to make changes to medical advance directive? - - - No - Patient declined  Copy of [place] in Chart? - - No - copy requested No - copy requested  Would patient like information on creating a medical advance directive? Yes (MAU/Ambulatory/Procedural Areas - Information given) - - -    Tobacco Social History   Tobacco Use  Smoking Status Current Every Day Smoker  . Packs/day: 1.00  . Years: 57.00  . Pack years: 57.00  Smokeless Tobacco Never Used     Ready to quit: No Counseling given: No   Clinical Intake:  Pre-visit preparation completed: Yes  Pain : No/denies pain Pain Score: 0-No pain     Nutritional Status: BMI 25 -29 Overweight Nutritional Risks: None Diabetes: No  How often do you need to have someone help you when you read instructions, pamphlets, or other written materials from your doctor or pharmacy?: 1 - Never What is the last grade level you completed in school?: Masters degree in [employer] + some PhD courses  Interpreter Needed?: No  Comments: pt lives with spouse Information entered by :: [employer], LPN  Past Medical History:  Diagnosis Date  . Chicken pox   . 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 s/p  MDT dual chamber PPM  . Diverticulitis   . History of stomach ulcers   . Hyperlipidemia   . Lymphocytic colitis   . Orthostatic hypotension   . Pancreatitis   . Paroxysmal atrial fibrillation [facility])    Past Surgical History:  Procedure Laterality Date  . APPENDECTOMY    . BACK SURGERY    . large intestine block    . PACEMAKER INSERTION     MDT dual chamber pacemaker  . TONSILLECTOMY AND ADENOIDECTOMY     Family History  Problem Relation Age of Onset  . Cancer Mother   . Heart disease Father    Social History   Socioeconomic History  . Marital status: Married    Spouse name: None  . Number of children: 2  . Years of education: None  . Highest education level: None  Social Needs  . Financial resource strain: None  . Food insecurity - worry: None  . Food insecurity - inability: None  . Transportation needs - medical: None  . Transportation needs - non-medical: None  Occupational History  . Occupation: Retired  Tobacco Use  . Smoking status: Current Every Day Smoker    Packs/day: 1.00    Years: 57.00    Pack years: 57.00  . Smokeless tobacco: Never Used  Substance and Sexual Activity  . Alcohol use: Yes    Alcohol/week: 0.0 oz  . Drug use: No  .  Sexual activity: No  Other Topics Concern  . None  Social History Narrative   Married.   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      Does not have a living will.   Desires CPR, does not want prolonged life support if futile.    Outpatient Encounter Medications as of 02/22/2017  Medication Sig  . apixaban (ELIQUIS) 5 MG TABS tablet Take 1 tablet (5 mg total) by mouth 2 (two) times daily.  . brimonidine (ALPHAGAN) 0.15 % ophthalmic solution Place 1 drop into both eyes 2 (two) times daily.   . Cholecalciferol (VITAMIN D3) 2000 UNITS capsule Take 2,000 Units by mouth daily.  Marland Kitchen diltiazem (CARDIZEM) 30 MG tablet Take 1 tablet (30 mg total) by mouth 2 (two) times daily as needed.  . fenofibrate (TRICOR) 145 MG  tablet take 1 tablet by mouth once daily  . FOLIC ACID PO Take 1 tablet by mouth daily.  . methotrexate 2.5 MG tablet Take 12.5 mg by mouth once a week.    No facility-administered encounter medications on file as of 02/22/2017.     Activities of Daily Living In your present state of health, do you have any difficulty performing the following activities: 02/22/2017  Hearing? N  Vision? N  Difficulty concentrating or making decisions? N  Walking or climbing stairs? N  Dressing or bathing? N  Doing errands, shopping? N  Preparing Food and eating ? N  Using the Toilet? N  In the past six months, have you accidently leaked urine? N  Do you have problems with loss of bowel control? N  Managing your Medications? N  Managing your Finances? N  Housekeeping or managing your Housekeeping? N  Some recent data might be hidden    Patient Care Team: Pleas Koch, NP as PCP - General (Internal Medicine) Deboraha Sprang, MD as Consulting Physician (Cardiology) Marlowe Sax, MD as Referring Physician (Internal Medicine) Birder Robson, MD as Referring Physician (Ophthalmology) Oneta Rack, MD as Referring Physician (Dermatology) Salli Real, DDS as Referring Physician (Dentistry)   Assessment:   This is a routine wellness examination for Rad.   Hearing Screening   125Hz  250Hz  500Hz  1000Hz  2000Hz  3000Hz  4000Hz  6000Hz  8000Hz   Right ear:   40 40 40  40    Left ear:   40 40 40  40    Vision Screening Comments: Last vision exam in Aug 2018 with Dr. George Ina    Exercise Activities and Dietary recommendations Current Exercise Habits: Home exercise routine, Type of exercise: walking;treadmill;Other - see comments(stationary bike), Time (Minutes): 20, Frequency (Times/Week): 7, Weekly Exercise (Minutes/Week): 140, Intensity: Moderate, Exercise limited by: None identified  Goals    . Increase physical activity     Starting 02/22/17, I will continue to walk at least 20  minutes daily.        Fall Risk Fall Risk  02/22/2017 02/08/2016 10/21/2014  Falls in the past year? No No No   Depression Screen PHQ 2/9 Scores 02/22/2017 02/08/2016 10/21/2014  PHQ - 2 Score 0 0 0  PHQ- 9 Score 0 - -    Cognitive Function MMSE - Mini Mental State Exam 02/22/2017 02/08/2016  Orientation to time 5 5  Orientation to Place 5 5  Registration 3 3  Attention/ Calculation 0 0  Recall 3 3  Language- name 2 objects 0 0  Language- repeat 1 1  Language- follow 3 step command 3 3  Language- read & follow direction 0 0  Write a  sentence 0 0  Copy design 0 0  Total score 20 20     PLEASE NOTE: A Mini-Cog screen was completed. Maximum score is 20. A value of 0 denotes this part of [place] MMSE was not completed or the patient failed this part of the Mini-Cog screening.   Mini-Cog Screening Orientation to Time - Max 5 pts Orientation to Place - Max 5 pts Registration - Max 3 pts Recall - Max 3 pts Language Repeat - Max 1 pts Language Follow 3 Step Command - Max 3 pts     Immunization History  Administered Date(s) Administered  . Influenza,inj,Quad PF,6+ Mos 12/15/2016  . Influenza-Unspecified 11/24/2015  . Pneumococcal Conjugate-13 01/30/2014  . Pneumococcal Polysaccharide-23 11/25/2015  . Td 05/04/2016  . Zoster 10/21/2011   Screening Tests Health Maintenance  Topic Date Due  . COLONOSCOPY  07/05/2020  . TETANUS/TDAP  05/05/2026  . INFLUENZA VACCINE  Completed  . PNA vac Low Risk Adult  Completed      Plan:     I have personally reviewed, addressed, and noted the following in the patient's chart:  A. Medical and social history B. Use of alcohol, tobacco or illicit drugs  C. Current medications and supplements D. Functional ability and status E.  Nutritional status F.  Physical activity G. Advance directives H. List of other physicians I.  Hospitalizations, surgeries, and ER visits in previous 12 months J.  [place] to include hearing,  vision, cognitive, depression L. Referrals and appointments - none  In addition, I have reviewed and discussed with patient certain preventive protocols, quality metrics, and best practice recommendations. A written personalized care plan for preventive services as well as general preventive health recommendations were provided to patient.  See attached scanned questionnaire for additional information.   Signed,   Lindell Noe, MHA, BS, LPN Health Coach

## 2017-02-22 NOTE — Progress Notes (Signed)
I reviewed health advisor's note, was available for consultation, and agree with documentation and plan.  

## 2017-02-22 NOTE — Progress Notes (Signed)
PCP notes:   Health maintenance:  No gaps identified.  Abnormal screenings:   None  Patient concerns:   None  Nurse concerns:  None  Next PCP appt:   03/01/17 @ 0930

## 2017-02-22 NOTE — Progress Notes (Signed)
Pre visit review using our clinic review tool, if applicable. No additional management support is needed unless otherwise documented below in the visit note. 

## 2017-03-01 ENCOUNTER — Encounter: Payer: Self-pay | Admitting: Primary Care

## 2017-03-01 ENCOUNTER — Ambulatory Visit (INDEPENDENT_AMBULATORY_CARE_PROVIDER_SITE_OTHER): Payer: Medicare Other | Admitting: Primary Care

## 2017-03-01 VITALS — BP 122/74 | HR 83 | Temp 97.9°F | Wt 164.8 lb

## 2017-03-01 DIAGNOSIS — M05749 Rheumatoid arthritis with rheumatoid factor of unspecified hand without organ or systems involvement: Secondary | ICD-10-CM

## 2017-03-01 DIAGNOSIS — Z720 Tobacco use: Secondary | ICD-10-CM

## 2017-03-01 DIAGNOSIS — Z86010 Personal history of colonic polyps: Secondary | ICD-10-CM

## 2017-03-01 DIAGNOSIS — Z0000 Encounter for general adult medical examination without abnormal findings: Secondary | ICD-10-CM | POA: Diagnosis not present

## 2017-03-01 DIAGNOSIS — E785 Hyperlipidemia, unspecified: Secondary | ICD-10-CM | POA: Diagnosis not present

## 2017-03-01 DIAGNOSIS — I951 Orthostatic hypotension: Secondary | ICD-10-CM | POA: Diagnosis not present

## 2017-03-01 DIAGNOSIS — I480 Paroxysmal atrial fibrillation: Secondary | ICD-10-CM | POA: Diagnosis not present

## 2017-03-01 DIAGNOSIS — Z950 Presence of cardiac pacemaker: Secondary | ICD-10-CM

## 2017-03-01 NOTE — Progress Notes (Signed)
Subjective:    Patient ID: Darryl White, male    DOB: June 09, 1940, 77 y.o.   MRN: [id number]  HPI  Darryl White is a 77 year old male who presents today for complete physical.  Immunizations: -Tetanus: Completed in 2018 -Influenza: Completed this season -Pneumonia: Completed Prevnar in 2015, completed Pneumovax in 2017 -Shingles: Completed Zostavax in 2013  Diet: He endorses a fair diet. Breakfast: Cereal, yogurt, occasional bagel  Lunch: Sandwich Dinner: Vegetables, little meat, little starches  Snacks: Fruit Desserts: 2-3 times weekly Beverages: Water, black coffee, vitamin water zero  Exercise: He does not routinely exercise Eye exam: Completed in October 2018 Dental exam: Completes semi-annually Colonoscopy: Completed in 2017 PSA: Completed in 2018, negative   Review of Systems  Constitutional: Negative for unexpected weight change.  HENT: Negative for rhinorrhea.   Respiratory: Negative for cough and shortness of breath.   Cardiovascular: Negative for chest pain.  Gastrointestinal: Negative for constipation and diarrhea.  Genitourinary: Negative for difficulty urinating.  Musculoskeletal: Negative for myalgias.  Skin: Negative for rash.  Allergic/Immunologic: Negative for environmental allergies.  Neurological: Negative for dizziness, numbness and headaches.  Psychiatric/Behavioral: The patient is not nervous/anxious.        Past Medical History:  Diagnosis Date  . Chicken pox   . 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 Diverticulitis   . History of stomach ulcers   . Hyperlipidemia   . Lymphocytic colitis   . Orthostatic hypotension   . Pancreatitis   . Paroxysmal atrial fibrillation (HCC)      Social History   Socioeconomic History  . Marital status: Married    Spouse name: Not on file  . Number of children: 2  . Years of education: Not on file  . Highest education level: Not on file  Social Needs  . Financial resource  strain: Not on file  . Food insecurity - worry: Not on file  . Food insecurity - inability: Not on file  . Transportation needs - medical: Not on file  . Transportation needs - non-medical: Not on file  Occupational History  . Occupation: Retired  Tobacco Use  . Smoking status: Current Every Day Smoker    Packs/day: 1.00    Years: 57.00    Pack years: 57.00  . Smokeless tobacco: Never Used  Substance and Sexual Activity  . Alcohol use: Yes    Alcohol/week: 0.0 oz  . Drug use: No  . Sexual activity: No  Other Topics Concern  . Not on file  Social History Narrative   Married.   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      Does not have a living will.   Desires CPR, does not want prolonged life support if futile.    Past Surgical History:  Procedure Laterality Date  . APPENDECTOMY    . BACK SURGERY    . large intestine block    . PACEMAKER INSERTION     MDT dual chamber pacemaker  . TONSILLECTOMY AND ADENOIDECTOMY      Family History  Problem Relation Age of Onset  . Cancer Mother   . Heart disease Father     Allergies  Allergen Reactions  . Sulfa Antibiotics Hives and Itching  . Penicillins Hives and Palpitations    Current Outpatient Medications on File Prior to Visit  Medication Sig Dispense Refill  . apixaban (ELIQUIS) 5 MG TABS tablet Take 1 tablet (5 mg total) by mouth 2 (two)  times daily. 180 tablet 3  . brimonidine (ALPHAGAN) 0.15 % ophthalmic solution Place 1 drop into both eyes 2 (two) times daily.     . Cholecalciferol (VITAMIN D3) 2000 UNITS capsule Take 2,000 Units by mouth daily.    Marland Kitchen diltiazem (CARDIZEM) 30 MG tablet Take 1 tablet (30 mg total) by mouth 2 (two) times daily as needed. 60 tablet 11  . fenofibrate (TRICOR) 145 MG tablet take 1 tablet by mouth once daily 90 tablet 0  . FOLIC ACID PO Take 1 tablet by mouth daily.    . methotrexate 2.5 MG tablet Take 12.5 mg by mouth once a week.      No current facility-administered  medications on file prior to visit.     BP 122/74   Pulse 83   Temp 97.9 F (36.6 C) (Oral)   Wt 164 lb 12 oz (74.7 kg)   SpO2 95%   BMI 26.39 kg/m    Objective:   Physical Exam  Constitutional: He is oriented to person, place, and time. He appears well-nourished.  HENT:  Right Ear: Tympanic membrane and ear canal normal.  Left Ear: Tympanic membrane and ear canal normal.  Nose: Nose normal. Right sinus exhibits no maxillary sinus tenderness and no frontal sinus tenderness. Left sinus exhibits no maxillary sinus tenderness and no frontal sinus tenderness.  Mouth/Throat: Oropharynx is clear and moist.  Eyes: Conjunctivae and EOM are normal. Pupils are equal, round, and reactive to light.  Neck: Neck supple. Carotid bruit is not present. No thyromegaly present.  Cardiovascular: Normal rate, regular rhythm and normal heart sounds.  Pulmonary/Chest: Effort normal and breath sounds normal. He has no wheezes. He has no rales.  Abdominal: Soft. Bowel sounds are normal. There is no tenderness.  Musculoskeletal: Normal range of motion.  Neurological: He is alert and oriented to person, place, and time. He has normal reflexes. No cranial nerve deficit.  Skin: Skin is warm and dry.  Psychiatric: He has a normal mood and affect.          Assessment & Plan:

## 2017-03-01 NOTE — Assessment & Plan Note (Signed)
Discussion today regarding cessation. Discussed options including patches, gum, Chantix. He is interested in Chantix and will think about this. Discussed potential side effects and warnings. He will notify us if he's interested.

## 2017-03-01 NOTE — Assessment & Plan Note (Signed)
Immunizations UTD. PSA UTD. Colonoscopy UTD. Recommended regular exercise, continue healthy diet. Exam unremarkable. Labs unremarkable. Follow up in 1 year.

## 2017-03-01 NOTE — Assessment & Plan Note (Signed)
Following with cardiology, remote pacer check every 3 months.

## 2017-03-01 NOTE — Patient Instructions (Signed)
Start exercising. You should be getting 150 minutes of exercise weekly.  Continue to work on eating plenty of vegetables, fruit, whole grains.  Ensure you are consuming 64 ounces of water daily.  Please notify me when you are interested in quitting smoking.  Follow up in 1 year for your annual exam or sooner if needed.  It was a pleasure to see you today!

## 2017-03-01 NOTE — Assessment & Plan Note (Signed)
Monitoring twice daily, readings running 130/80's with HR of 70-80.

## 2017-03-01 NOTE — Assessment & Plan Note (Signed)
Recent lipid panel unremarkable, continue fenofibrate.

## 2017-03-01 NOTE — Assessment & Plan Note (Signed)
Completed colonoscopy in 2017, due in 5 years.

## 2017-03-01 NOTE — Assessment & Plan Note (Signed)
Doing well on methotrexate, following with rheumatology.

## 2017-03-01 NOTE — Assessment & Plan Note (Signed)
Following with cardiology every 6 months. Continue apixaban, diltiazem.

## 2017-03-06 ENCOUNTER — Ambulatory Visit (INDEPENDENT_AMBULATORY_CARE_PROVIDER_SITE_OTHER): Payer: Medicare Other | Admitting: *Deleted

## 2017-03-06 DIAGNOSIS — R55 Syncope and collapse: Secondary | ICD-10-CM | POA: Diagnosis not present

## 2017-03-06 DIAGNOSIS — I480 Paroxysmal atrial fibrillation: Secondary | ICD-10-CM

## 2017-03-08 NOTE — Progress Notes (Signed)
Remote pacemaker transmission.   

## 2017-03-09 ENCOUNTER — Encounter: Payer: Self-pay | Admitting: Cardiology

## 2017-03-09 LAB — CUP PACEART REMOTE DEVICE CHECK
Battery Impedance: 1476 Ohm
Battery Remaining Longevity: 38 mo
Battery Voltage: 2.75 V
Brady Statistic AP VP Percent: 0 %
Brady Statistic AP VS Percent: 74 %
Brady Statistic AS VP Percent: 0 %
Brady Statistic AS VS Percent: 25 %
Date Time Interrogation Session: 20190115133002
Implantable Lead Implant Date: 19941110
Implantable Lead Implant Date: 19941110
Implantable Lead Location: 753859
Implantable Lead Location: 753860
Implantable Lead Model: 5034
Implantable Lead Model: 5534
Implantable Pulse Generator Implant Date: 20081023
Lead Channel Impedance Value: 1062 Ohm
Lead Channel Impedance Value: 1258 Ohm
Lead Channel Pacing Threshold Amplitude: 0.375 V
Lead Channel Pacing Threshold Pulse Width: 0.4 ms
Lead Channel Setting Pacing Amplitude: 2 V
Lead Channel Setting Pacing Amplitude: 2 V
Lead Channel Setting Pacing Pulse Width: 0.4 ms
Lead Channel Setting Sensing Sensitivity: 5.6 mV

## 2017-03-13 ENCOUNTER — Other Ambulatory Visit: Payer: Self-pay | Admitting: Primary Care

## 2017-03-13 DIAGNOSIS — E785 Hyperlipidemia, unspecified: Secondary | ICD-10-CM

## 2017-03-13 MED ORDER — FENOFIBRATE 145 MG PO TABS: 145.0000 mg | ORAL_TABLET | Freq: Every day | ORAL | 3 refills | Status: DC

## 2017-03-13 NOTE — Telephone Encounter (Signed)
Received faxed refill request for fenofibrate (TRICOR) 145 MG tablet  Last prescribed by Dr Deborra Medina on 12/05/2016. Last seen on 03/01/2017

## 2017-03-13 NOTE — Telephone Encounter (Signed)
Refill sent to pharmacy.   

## 2017-03-15 ENCOUNTER — Telehealth: Payer: Self-pay | Admitting: *Deleted

## 2017-03-15 DIAGNOSIS — Z87891 Personal history of nicotine dependence: Secondary | ICD-10-CM

## 2017-03-15 DIAGNOSIS — Z122 Encounter for screening for malignant neoplasm of respiratory organs: Secondary | ICD-10-CM

## 2017-03-15 NOTE — Telephone Encounter (Signed)
Notified patient that annual lung cancer screening low dose CT scan is due currently or will be in near future. Confirmed that patient is within the age range of 55-77, and asymptomatic, (no signs or symptoms of lung cancer). Patient denies illness that would prevent curative treatment for lung cancer if found. Verified smoking history, (current, 57.75 pack year). The shared decision making visit was done 03/14/16. Patient is agreeable for CT scan being scheduled.

## 2017-03-30 ENCOUNTER — Ambulatory Visit
Admission: RE | Admit: 2017-03-30 | Discharge: 2017-03-30 | Disposition: A | Discharge status: Home / Self Care | Payer: Medicare Other | Source: Ambulatory Visit | Attending: Nurse Practitioner | Admitting: Nurse Practitioner

## 2017-03-30 DIAGNOSIS — Z122 Encounter for screening for malignant neoplasm of respiratory organs: Secondary | ICD-10-CM | POA: Diagnosis present

## 2017-03-30 DIAGNOSIS — J432 Centrilobular emphysema: Secondary | ICD-10-CM | POA: Insufficient documentation

## 2017-03-30 DIAGNOSIS — Z87891 Personal history of nicotine dependence: Secondary | ICD-10-CM | POA: Diagnosis present

## 2017-03-30 DIAGNOSIS — I700 Atherosclerosis of aorta: Secondary | ICD-10-CM | POA: Insufficient documentation

## 2017-03-30 DIAGNOSIS — I2510 Atherosclerotic heart disease of native coronary artery without angina pectoris: Secondary | ICD-10-CM | POA: Insufficient documentation

## 2017-04-02 ENCOUNTER — Other Ambulatory Visit: Payer: Self-pay | Admitting: *Deleted

## 2017-04-02 DIAGNOSIS — J849 Interstitial pulmonary disease, unspecified: Secondary | ICD-10-CM

## 2017-04-02 DIAGNOSIS — J438 Other emphysema: Secondary | ICD-10-CM

## 2017-04-02 NOTE — Progress Notes (Signed)
After discussion with Alma Friendly NP, contacted patient and discussed lung screening scan results including incidental findings of severe emphysema and possible interstitial pneumonia, as well as cardiac atherosclerotic changes. Also discussed more nodular area of concern in lungs. Reviewed plan for pulmonary consult and discussion in weekly multidisciplinary thoracic conference this week. Patient is in agreement with plan.

## 2017-04-04 ENCOUNTER — Encounter: Payer: Self-pay | Admitting: Internal Medicine

## 2017-04-04 ENCOUNTER — Ambulatory Visit (INDEPENDENT_AMBULATORY_CARE_PROVIDER_SITE_OTHER): Payer: Medicare Other | Admitting: Internal Medicine

## 2017-04-04 VITALS — BP 126/88 | HR 74 | Ht 66.0 in | Wt 166.0 lb

## 2017-04-04 DIAGNOSIS — J449 Chronic obstructive pulmonary disease, unspecified: Secondary | ICD-10-CM

## 2017-04-04 DIAGNOSIS — Z720 Tobacco use: Secondary | ICD-10-CM | POA: Diagnosis not present

## 2017-04-04 DIAGNOSIS — F17210 Nicotine dependence, cigarettes, uncomplicated: Secondary | ICD-10-CM

## 2017-04-04 MED ORDER — VARENICLINE TARTRATE 0.5 MG X 11 & 1 MG X 42 PO MISC: ORAL | 0 refills | Status: DC

## 2017-04-04 MED ORDER — NICOTINE 21 MG/24HR TD PT24: 21.0000 mg | MEDICATED_PATCH | Freq: Every day | TRANSDERMAL | 0 refills | Status: DC

## 2017-04-04 NOTE — Progress Notes (Signed)
[facility] Consultation      Assessment and Plan:  Pulmonary fibrosis. 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  I suspect that this is related to his underlying rheumatoid arthritis, doubtful that methotrexate would cause this. -Given his relative lack of symptoms, and the stability of his fibrotic changes, would not initiate medications for pulmonary fibrosis at this time, we will continue to monitor.  Emphysema. - Advanced apical emphysematous changes with only minimal dyspnea on exertion. -We will check pulmonary function test, given lack of symptoms we will not start inhalers at this time. - Discussed the importance of smoke cessation, discussed that his lung function will decline further, and he may reach a point where his breathing becomes problematic if he continues to smoke. -60+ pack years. -Continue CT lung cancer screening.  Nicotine abuse. - Discussed the importance of smoke cessation, spent 10 minutes in discussion. - Patient has tried quitting smoking in the past with nicotine patch, but found that it was unsuccessful.  His wife is present and gives some of the history, she notes that he may have not been very serious and that attempt to quit.  He has not tried Chantix in the past, we discussed potential side effects including bizarre dreams and stomach upset.  Patient's wife is concerned about potential side effects and how they may affect him, therefore we settled on trying the patches first.  I discussed quitting strategies, as detailed in his patient instructions.  Upon placing the order for nicotine patches 21 mg in the chart there is a message stating that this may not be covered, therefore in addition to sending this I gave him a paper prescription for Chantix to try if his nicotine patches are not covered.   Date: 04/04/2017  MRN#  [id number] Darryl White 24-Mar-1940  Referring Physician: Cancer center.   DYKE WEIBLE is a 77 y.o. old male seen in consultation for chief complaint of:    Chief Complaint  Patient presents with  . Advice Only    SOB w/activity or bending over: cough due to drainage: sneezing:     HPI:   The patient is a 77 year old male  with a history of atrial fibrillation on anticoagulation, rheumatoid arthritis. He went for low dose Ct chest for screening, results showed emphysema and usual interstitial pneumonia.  He notes that if he bends over he will run out of breath. He will sometimes run out of breath when walking over a dune at the beach or mopping the floor.  He is a smoker of less than ppd. 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  He has a dog, sleeps in the bedroom.  He has never had allergy testing, but he will occasionally have allergic symptoms with certain fabric stores.  Sometimes has reflux which is symptomatic every month or so,  he takes occasional medicine.  He has sinus drainage, he takes benadryl at night which helps, he takes nothing during the day.   He has RA, he is on MTX 12.5 mg weekly, and this has his symptoms completely controlled with this.  He has a history of syncope, which was controlled with a pacemaker 20 yrs ago.   **Imaging personally reviewed, low-dose CT chest 03/30/17; bilateral severe emphysematous changes, bibasilar fibrotic changes in a typical UIP pattern, appears relatively unchanged from previous scan in 03/14/16.   PMHX:   Past  Medical History:  Diagnosis Date  . Chicken pox   . 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 Diverticulitis   . History of stomach ulcers   . Hyperlipidemia   . Lymphocytic colitis   . Orthostatic hypotension   . Pancreatitis   . Paroxysmal atrial fibrillation [facility])    Surgical Hx:  Past Surgical History:  Procedure Laterality Date  . APPENDECTOMY    . BACK  SURGERY    . large intestine block    . PACEMAKER INSERTION     MDT dual chamber pacemaker  . TONSILLECTOMY AND ADENOIDECTOMY     Family Hx:  Family History  Problem Relation Age of Onset  . Cancer Mother   . Heart disease Father    Social Hx:   Social History   Tobacco Use  . Smoking status: Current Every Day Smoker    Packs/day: 1.00    Years: 57.00    Pack years: 57.00  . Smokeless tobacco: Never Used  Substance Use Topics  . Alcohol use: Yes    Alcohol/week: 0.0 oz  . Drug use: No   Medication:    Current Outpatient Medications:  .  apixaban (ELIQUIS) 5 MG TABS tablet, Take 1 tablet (5 mg total) by mouth 2 (two) times daily., Disp: 180 tablet, Rfl: 3 .  brimonidine (ALPHAGAN) 0.15 % ophthalmic solution, Place 1 drop into both eyes 2 (two) times daily. , Disp: , Rfl:  .  Cholecalciferol (VITAMIN D3) 2000 UNITS capsule, Take 2,000 Units by mouth daily., Disp: , Rfl:  .  diltiazem (CARDIZEM) 30 MG tablet, Take 1 tablet (30 mg total) by mouth 2 (two) times daily as needed., Disp: 60 tablet, Rfl: 11 .  fenofibrate (TRICOR) 145 MG tablet, Take 1 tablet (145 mg total) by mouth daily., Disp: 90 tablet, Rfl: 3 .  FOLIC ACID PO, Take 1 tablet by mouth daily., Disp: , Rfl:  .  methotrexate 2.5 MG tablet, Take 12.5 mg by mouth once a week. , Disp: , Rfl:    Allergies:  Sulfa antibiotics and Penicillins  Review of Systems: Gen:  Denies  fever, sweats, chills HEENT: Denies blurred vision, double vision. bleeds, sore throat Cvc:  No dizziness, chest pain. Resp:   Denies cough or sputum production, shortness of breath Gi: Denies swallowing difficulty, stomach pain. Gu:  Denies bladder incontinence, burning urine Ext:   No Joint pain, stiffness. Skin: No skin rash,  hives  Endoc:  No polyuria, polydipsia. Psych: No depression, insomnia. Other:  All other systems were reviewed with the patient and were negative other that what is mentioned in the HPI.   Physical Examination:     VS: BP 126/88 (BP Location: Left Arm, Cuff Size: Normal)   Pulse 74   Ht 5\' 6"  (1.676 m)   Wt 166 lb (75.3 kg)   SpO2 99%   BMI 26.79 kg/m   General Appearance: No distress  Neuro:without focal findings,  speech normal,  HEENT: PERRLA, EOM intact.   Pulmonary: normal breath sounds, No wheezing.  CardiovascularNormal S1,S2.  No m/r/g.   Abdomen: Benign, Soft, non-tender. Renal:  No costovertebral tenderness  GU:  No performed at this time. Endoc: No evident thyromegaly, no signs of acromegaly. Skin:   warm, no rashes, no ecchymosis  Extremities: normal, no cyanosis, clubbing.  Other findings:    LABORATORY PANEL:   CBC No results for input(s): WBC, HGB, HCT, PLT in the last 168 hours. ------------------------------------------------------------------------------------------------------------------  Chemistries  No results for input(s): NA, K, CL, CO2, GLUCOSE, BUN, CREATININE, CALCIUM, MG, AST, ALT, ALKPHOS, BILITOT in the last 168 hours.  Invalid input(s): GFRCGP ------------------------------------------------------------------------------------------------------------------  Cardiac Enzymes No results for input(s): TROPONINI in the last 168 hours. ------------------------------------------------------------  RADIOLOGY:  No results found.     Thank  you for the consultation and for allowing [facility] to assist in the care of your patient. Our recommendations are noted above.  Please contact us if we can be of further service.   Marda Stalker, MD.  Board Certified in Internal Medicine, Pulmonary Medicine, [place], and Sleep Medicine.  [facility] Office Number: [phone]  Patricia Pesa, M.D.  Merton Border, M.D  04/04/2017

## 2017-04-04 NOTE — Patient Instructions (Addendum)
You have a condition called pulmonary fibrosis (a.k.a. usual interstitial pneumonia) which is most probably related to your rheumatoid arthritis.  You do not require treatment for it at this time, however if your breathing should decline we may consider starting treatment for this.  We will send you for a pulmonary function test to get a baseline test of your lung function.  --Quitting smoking is the most important thing that you can do for your health.  --Quitting smoking will have greater affect on your health than any medicine that we can give you.   --The best way to quit is to set a quit date, usually a day that has meaning like someone's birthday.  --Start any medication prescribed for quitting one week before you quit date. Then toss out the cigarettes on your quit date.  --If you start smoking again, start from scratch--set another quit day and try again!

## 2017-04-06 ENCOUNTER — Telehealth: Payer: Self-pay | Admitting: *Deleted

## 2017-04-06 DIAGNOSIS — R918 Other nonspecific abnormal finding of lung field: Secondary | ICD-10-CM

## 2017-04-06 NOTE — Telephone Encounter (Signed)
Discussed CT findings with pt, and informed that we are ordering a PET scan.

## 2017-04-06 NOTE — Telephone Encounter (Signed)
Pet scan order placed per DR. Will pend order until DR speaks with pt to inform him.    DR please advise on which dx you would like to use and then please sign order once you have spoken with pt. Thanks.

## 2017-04-06 NOTE — Telephone Encounter (Signed)
Pt states he is returning Dr. Mathis Fare call

## 2017-04-06 NOTE — Telephone Encounter (Signed)
Pt is returning your call and wants you to call him back.

## 2017-04-06 NOTE — Telephone Encounter (Signed)
-----   Message from Lieutenant Diego, RN sent at 04/06/2017 10:15 AM EST ----- Regarding: pet and f/u with Ram Here is another patient that we discussed in conference. Dr. Ashby Dawes wants him to have a PET scan for lung mass and then see him afterward.   I can enter the order but I'm afraid insurance might not like it if it doesn't come from your office since he hasn't seen medical oncology. I spoke with the patient yesterday and he is in agreement with that plan.

## 2017-04-12 ENCOUNTER — Telehealth: Payer: Self-pay | Admitting: Internal Medicine

## 2017-04-12 DIAGNOSIS — R918 Other nonspecific abnormal finding of lung field: Secondary | ICD-10-CM

## 2017-04-12 NOTE — Telephone Encounter (Signed)
Chelsie from [place] here at [facility] calling about PET scan scheduled for tomorrow morning for patient.   They need [place] to please place in order. The order was put in as a prescription   Please advise

## 2017-04-12 NOTE — Telephone Encounter (Signed)
New pet order placed. Nothing further needed.

## 2017-04-13 ENCOUNTER — Ambulatory Visit
Admission: RE | Admit: 2017-04-13 | Discharge: 2017-04-13 | Disposition: A | Discharge status: Home / Self Care | Payer: Medicare Other | Source: Ambulatory Visit | Attending: Internal Medicine | Admitting: Internal Medicine

## 2017-04-13 DIAGNOSIS — R918 Other nonspecific abnormal finding of lung field: Secondary | ICD-10-CM | POA: Insufficient documentation

## 2017-04-13 DIAGNOSIS — K118 Other diseases of salivary glands: Secondary | ICD-10-CM | POA: Insufficient documentation

## 2017-04-13 LAB — GLUCOSE, CAPILLARY: Glucose-Capillary: 87 mg/dL (ref 65–99)

## 2017-04-13 MED ORDER — FLUDEOXYGLUCOSE F - 18 (FDG) INJECTION
12.8300 | Freq: Once | INTRAVENOUS | Status: AC | PRN
  Administered 2017-04-13: 12.83 via INTRAVENOUS

## 2017-04-17 ENCOUNTER — Other Ambulatory Visit: Payer: Self-pay | Admitting: *Deleted

## 2017-04-20 ENCOUNTER — Telehealth: Payer: Self-pay | Admitting: *Deleted

## 2017-04-20 NOTE — Telephone Encounter (Signed)
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 Shared recommendation and plan of care with patient. This includes no further attention or follow up needed for parotid finding. Lung nodule in question is in a very difficult to biopsy location and recommendation is for 3 month follow up for consideration of radiation treatment without biopsy if it increases in size. Patient verbalizes understanding and agreement with this plan.

## 2017-04-20 NOTE — Telephone Encounter (Signed)
Noted, appreciate the update  

## 2017-05-21 ENCOUNTER — Telehealth: Payer: Self-pay | Admitting: Internal Medicine

## 2017-05-21 NOTE — Telephone Encounter (Signed)
Pt is wanting to schedule PFT.

## 2017-05-21 NOTE — Telephone Encounter (Signed)
Pt calling wanting to schedule his breathing test   Please call back

## 2017-05-22 NOTE — Telephone Encounter (Signed)
PFT scheduled for Tues 09/25/17 at 8:30 at [facility]. Instructions mailed to patient and pt voiced understanding.  Pt is aware that in a couple of months to call us back and schedule follow up appointment with Dr. Juanell Fairly after 09/25/17 to go over the results of this test. Pt aware. Nothing else needed at this time. Rhonda J Cobb

## 2017-06-05 ENCOUNTER — Ambulatory Visit (INDEPENDENT_AMBULATORY_CARE_PROVIDER_SITE_OTHER): Payer: Medicare Other | Admitting: *Deleted

## 2017-06-05 DIAGNOSIS — R55 Syncope and collapse: Secondary | ICD-10-CM | POA: Diagnosis not present

## 2017-06-05 DIAGNOSIS — I480 Paroxysmal atrial fibrillation: Secondary | ICD-10-CM

## 2017-06-06 NOTE — Progress Notes (Signed)
Remote pacemaker transmission.   

## 2017-06-07 ENCOUNTER — Encounter: Payer: Self-pay | Admitting: Cardiology

## 2017-06-07 LAB — CUP PACEART REMOTE DEVICE CHECK
Battery Impedance: 1559 Ohm
Battery Remaining Longevity: 36 mo
Battery Voltage: 2.75 V
Brady Statistic AP VP Percent: 0 %
Brady Statistic AP VS Percent: 76 %
Brady Statistic AS VP Percent: 0 %
Brady Statistic AS VS Percent: 24 %
Date Time Interrogation Session: 20190416201626
Implantable Lead Implant Date: 19941110
Implantable Lead Implant Date: 19941110
Implantable Lead Location: 753859
Implantable Lead Location: 753860
Implantable Lead Model: 5034
Implantable Lead Model: 5534
Implantable Pulse Generator Implant Date: 20081023
Lead Channel Impedance Value: 1250 Ohm
Lead Channel Impedance Value: 987 Ohm
Lead Channel Pacing Threshold Amplitude: 0.5 V
Lead Channel Pacing Threshold Pulse Width: 0.4 ms
Lead Channel Sensing Intrinsic Amplitude: 0.7 mV
Lead Channel Sensing Intrinsic Amplitude: 11.2 mV
Lead Channel Setting Pacing Amplitude: 2 V
Lead Channel Setting Pacing Amplitude: 2 V
Lead Channel Setting Pacing Pulse Width: 0.4 ms
Lead Channel Setting Sensing Sensitivity: 5.6 mV

## 2017-06-20 ENCOUNTER — Telehealth: Payer: Self-pay | Admitting: Internal Medicine

## 2017-06-20 DIAGNOSIS — I480 Paroxysmal atrial fibrillation: Secondary | ICD-10-CM

## 2017-06-20 MED ORDER — APIXABAN 5 MG PO TABS
5.0000 mg | ORAL_TABLET | Freq: Two times a day (BID) | ORAL | 3 refills | Status: DC
Start: 2017-06-20 — End: 2018-03-04

## 2017-06-20 NOTE — Telephone Encounter (Signed)
Refill Request.  

## 2017-06-20 NOTE — Telephone Encounter (Signed)
°*  STAT* If patient is at the pharmacy, call can be transferred to refill team.   1. Which medications need to be refilled? (please list name of each medication and dose if known)  Eliquis   2. Which pharmacy/location (including street and city if local pharmacy) is medication to be sent to? [facility] on [address]  3. Do they need a 30 day or 90 day supply? 90 day

## 2017-06-27 ENCOUNTER — Other Ambulatory Visit: Payer: Self-pay | Admitting: Internal Medicine

## 2017-06-27 ENCOUNTER — Telehealth: Payer: Self-pay | Admitting: Internal Medicine

## 2017-06-27 MED ORDER — GLYCOPYRROLATE-FORMOTEROL 9-4.8 MCG/ACT IN AERO: 2.0000 | INHALATION_SPRAY | Freq: Every day | RESPIRATORY_TRACT | 5 refills | Status: DC

## 2017-06-27 NOTE — Telephone Encounter (Signed)
Sent in bevespi. If not covered will do duonebs.

## 2017-06-27 NOTE — Telephone Encounter (Signed)
Patient aware.ss

## 2017-06-27 NOTE — Telephone Encounter (Signed)
Pt calling stating since stop smoking (60 days) his breathing has gotten much worst. He states would like some advise or help on this for Tuesday for a month he is going camping He is asking for maybe a nebulizer or something in place to help him   Would like advise   Please call back

## 2017-06-30 ENCOUNTER — Telehealth: Payer: Self-pay | Admitting: *Deleted

## 2017-06-30 DIAGNOSIS — R918 Other nonspecific abnormal finding of lung field: Secondary | ICD-10-CM

## 2017-06-30 DIAGNOSIS — Z122 Encounter for screening for malignant neoplasm of respiratory organs: Secondary | ICD-10-CM

## 2017-06-30 NOTE — Telephone Encounter (Signed)
Notified patient that lung cancer screening low dose CT scan follow up is due currently or will be in near future. Confirmed that patient is within the age range of 55-77, and asymptomatic, (no signs or symptoms of lung cancer). Patient denies illness that would prevent curative treatment for lung cancer if found. Verified smoking history, (former, quit 04/2017, 57.75 pack eyar). The shared decision making visit was done 03/14/16. Patient is agreeable for CT scan being scheduled 08/07/17, delay is related to patient being out of town.

## 2017-08-07 ENCOUNTER — Ambulatory Visit
Admission: RE | Admit: 2017-08-07 | Discharge: 2017-08-07 | Disposition: A | Discharge status: Home / Self Care | Payer: Medicare Other | Source: Ambulatory Visit | Attending: Oncology | Admitting: Oncology

## 2017-08-07 DIAGNOSIS — F17210 Nicotine dependence, cigarettes, uncomplicated: Secondary | ICD-10-CM | POA: Insufficient documentation

## 2017-08-07 DIAGNOSIS — J439 Emphysema, unspecified: Secondary | ICD-10-CM | POA: Diagnosis not present

## 2017-08-07 DIAGNOSIS — I700 Atherosclerosis of aorta: Secondary | ICD-10-CM | POA: Diagnosis not present

## 2017-08-07 DIAGNOSIS — R911 Solitary pulmonary nodule: Secondary | ICD-10-CM | POA: Diagnosis not present

## 2017-08-07 DIAGNOSIS — Z122 Encounter for screening for malignant neoplasm of respiratory organs: Secondary | ICD-10-CM | POA: Diagnosis not present

## 2017-08-07 DIAGNOSIS — R918 Other nonspecific abnormal finding of lung field: Secondary | ICD-10-CM | POA: Diagnosis present

## 2017-08-13 ENCOUNTER — Telehealth: Payer: Self-pay | Admitting: *Deleted

## 2017-08-13 NOTE — Telephone Encounter (Signed)
Notified patient of LDCT lung cancer screening program results with recommendation for 6 month follow up imaging. Also notified of incidental findings noted below and is encouraged to discuss further with PCP who will receive a copy of this note and/or the CT report. Patient verbalizes understanding.   IMPRESSION: 1. 12.4 mm nodule of concern in the medial right base has not changed in volumetric measurement in the interval although the lesion appears qualitatively more confluent than on the prior screening CT. Given this qualitative change and the low level hypermetabolism on the recent PET-CT, close continued follow-up is recommended. Lung-RADS 3, probably benign findings. Short-term follow-up in 6 months is recommended with repeat low-dose chest CT without contrast (please use the following order, "CT CHEST LCS NODULE FOLLOW-UP W/O CM"). 2.  Aortic Atherosclerois (ICD10-170.0) 3.  Emphysema. (ICD10-J43.9) 4. Interstitial changes in the lungs bilaterally, similar to prior and suggesting usual interstitial pneumonia (UIP).

## 2017-08-14 NOTE — Telephone Encounter (Signed)
Will forward to pulmonologist for further input.

## 2017-08-14 NOTE — Telephone Encounter (Signed)
Noted, have discussed with patient previously, and will continue to address this and smoking cessation at subsequent visits. Thanks.

## 2017-09-04 ENCOUNTER — Ambulatory Visit (INDEPENDENT_AMBULATORY_CARE_PROVIDER_SITE_OTHER): Payer: Medicare Other | Admitting: *Deleted

## 2017-09-04 DIAGNOSIS — I480 Paroxysmal atrial fibrillation: Secondary | ICD-10-CM | POA: Diagnosis not present

## 2017-09-04 NOTE — Progress Notes (Signed)
Remote pacemaker transmission.   

## 2017-09-05 ENCOUNTER — Encounter: Payer: Self-pay | Admitting: Cardiology

## 2017-09-07 LAB — CUP PACEART REMOTE DEVICE CHECK
Battery Impedance: 1760 Ohm
Battery Remaining Longevity: 32 mo
Battery Voltage: 2.75 V
Brady Statistic AP VP Percent: 0 %
Brady Statistic AP VS Percent: 77 %
Brady Statistic AS VP Percent: 0 %
Brady Statistic AS VS Percent: 23 %
Date Time Interrogation Session: 20190716120224
Implantable Lead Implant Date: 19941110
Implantable Lead Implant Date: 19941110
Implantable Lead Location: 753859
Implantable Lead Location: 753860
Implantable Lead Model: 5034
Implantable Lead Model: 5534
Implantable Pulse Generator Implant Date: 20081023
Lead Channel Impedance Value: 1013 Ohm
Lead Channel Impedance Value: 1185 Ohm
Lead Channel Pacing Threshold Amplitude: 0.5 V
Lead Channel Pacing Threshold Pulse Width: 0.4 ms
Lead Channel Setting Pacing Amplitude: 2 V
Lead Channel Setting Pacing Amplitude: 2 V
Lead Channel Setting Pacing Pulse Width: 0.4 ms
Lead Channel Setting Sensing Sensitivity: 5.6 mV

## 2017-09-11 ENCOUNTER — Encounter: Payer: Self-pay | Admitting: Internal Medicine

## 2017-09-11 ENCOUNTER — Ambulatory Visit (INDEPENDENT_AMBULATORY_CARE_PROVIDER_SITE_OTHER): Payer: Medicare Other | Admitting: Internal Medicine

## 2017-09-11 VITALS — BP 121/83 | HR 77 | Ht 66.0 in | Wt 174.5 lb

## 2017-09-11 DIAGNOSIS — I442 Atrioventricular block, complete: Secondary | ICD-10-CM

## 2017-09-11 DIAGNOSIS — Z79899 Other long term (current) drug therapy: Secondary | ICD-10-CM

## 2017-09-11 DIAGNOSIS — I480 Paroxysmal atrial fibrillation: Secondary | ICD-10-CM | POA: Diagnosis not present

## 2017-09-11 DIAGNOSIS — Z950 Presence of cardiac pacemaker: Secondary | ICD-10-CM | POA: Diagnosis not present

## 2017-09-11 DIAGNOSIS — R55 Syncope and collapse: Secondary | ICD-10-CM

## 2017-09-11 NOTE — Progress Notes (Signed)
Patient Care Team: Pleas Koch, NP as PCP - General (Internal Medicine) Deboraha Sprang, MD as Consulting Physician (Cardiology) Marlowe Sax, MD as Referring Physician (Internal Medicine) Birder Robson, MD as Referring Physician (Ophthalmology) Oneta Rack, MD as Referring Physician (Dermatology) Salli Real, DDS as Referring Physician (Dentistry)   HPI  Darryl White is a 77 y.o. male Seen to establish followup.  Prev seen at [facility] for  PM and OI associated with syncope  New episode AFib>> [facility] 8/16 and converted spontanously   He had atrial undersensing resulting in failure to mode switch and tracking at the upper rate limit. PM implanted initially 20 years ago for syncope. He has had no recurrent syncope.  This was presumably neurally mediated by the family's description  He is anticoagulated with apixoban without bleeding   The patient denies chest pain, shortness of breath, nocturnal dyspnea, orthopnea or peripheral edema.  There have been no palpitations, lightheadedness or syncope.      .   Date Cr Hgb  6/18 1.1 14.9  1/19  1.14 38.7   Thromboembolic risk factors ( age  -2) for a CHADSVASc Score of 2   Myoview 8/16 EF 65% No ischemia   Past Medical History:  Diagnosis Date  . Chicken pox   . 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 Diverticulitis   . History of stomach ulcers   . Hyperlipidemia   . Lymphocytic colitis   . Orthostatic hypotension   . Pancreatitis   . Paroxysmal atrial fibrillation [facility])     Past Surgical History:  Procedure Laterality Date  . APPENDECTOMY    . BACK SURGERY    . large intestine block    . PACEMAKER INSERTION     MDT dual chamber pacemaker  . TONSILLECTOMY AND ADENOIDECTOMY      Current Outpatient Medications  Medication Sig Dispense Refill  . apixaban (ELIQUIS) 5 MG TABS tablet Take 1 tablet (5 mg total) by mouth 2 (two) times daily. 180 tablet 3  . brimonidine  (ALPHAGAN) 0.15 % ophthalmic solution Place 1 drop into both eyes 2 (two) times daily.     . Cholecalciferol (VITAMIN D3) 2000 UNITS capsule Take 2,000 Units by mouth daily.    Marland Kitchen diltiazem (CARDIZEM) 30 MG tablet Take 1 tablet (30 mg total) by mouth 2 (two) times daily as needed. 60 tablet 11  . fenofibrate (TRICOR) 145 MG tablet Take 1 tablet (145 mg total) by mouth daily. 90 tablet 3  . FOLIC ACID PO Take 1 tablet by mouth daily.    . Glycopyrrolate-Formoterol (BEVESPI AEROSPHERE) 9-4.8 MCG/ACT AERO Inhale 2 puffs into the lungs daily. 1 Inhaler 5  . methotrexate 2.5 MG tablet Take 12.5 mg by mouth once a week.      No current facility-administered medications for this visit.     Allergies  Allergen Reactions  . Sulfa Antibiotics Hives and Itching  . Penicillins Hives and Palpitations      Review of Systems negative except from HPI and PMH  Physical Exam BP 121/83 (BP Location: Right Arm, Patient Position: Sitting, Cuff Size: Normal)   Pulse 77   Ht 5\' 6"  (1.676 m)   Wt 174 lb 8 oz (79.2 kg)   BMI 28.17 kg/m  Well developed and nourished in no acute distress HENT normal Neck supple with JVP-flat Clear Regular rate and rhythm, no murmurs or gallops Abd-soft with active BS No Clubbing  cyanosis edema Skin-warm and dry A & Oriented  Grossly normal sensory and motor function  ECG Apacing 77 20/08/36   Assessment and  Plan  Syncope-neurally mediated  Labile hypertension and orthostatic hypotension  Pacemaker-Medtronic  The patient's device was interrogated.  The information was reviewed. No changes were made in the programming.     Atrial fibrillation-paroxysmal  Sinus node dysfunction   CHADS-VASc score-2   No syncope  No intercurrent atrial fibrillation or flutter  On Anticoagulation;  No bleeding issues   BP stable without orthostasis

## 2017-09-11 NOTE — Addendum Note (Signed)
Addended byAlvis Lemmings C on: 09/11/2017 11:20 AM   Modules accepted: Orders

## 2017-09-11 NOTE — Patient Instructions (Signed)
Medication Instructions: - Your physician recommends that you continue on your current medications as directed. Please refer to the Current Medication list given to you today.  Labwork: - Your physician recommends that you have lab work today: BMP/ CBC  Procedures/Testing: - none ordered  Follow-Up: - Remote monitoring is used to monitor your Pacemaker of ICD from home. This monitoring reduces the number of office visits required to check your device to one time per year. It allows [place] to keep an eye on the functioning of your device to ensure it is working properly. You are scheduled for a device check from home on 12/04/17. You may send your transmission at any time that day. If you have a wireless device, the transmission will be sent automatically. After your physician reviews your transmission, you will receive a postcard with your next transmission date.  - Your physician wants you to follow-up in: 1 year with [name]. You will receive a reminder letter in the mail two months in advance. If you don't receive a letter, please call our office to schedule the follow-up appointment.   Any Additional Special Instructions Will Be Listed Below (If Applicable).     If you need a refill on your cardiac medications before your next appointment, please call your pharmacy.

## 2017-09-12 LAB — CUP PACEART INCLINIC DEVICE CHECK
Battery Impedance: 1817 Ohm
Battery Remaining Longevity: 31 mo
Battery Voltage: 2.74 V
Brady Statistic AP VP Percent: 0 %
Brady Statistic AP VS Percent: 77 %
Brady Statistic AS VP Percent: 0 %
Brady Statistic AS VS Percent: 22 %
Date Time Interrogation Session: 20190723145030
Implantable Lead Implant Date: 19941110
Implantable Lead Implant Date: 19941110
Implantable Lead Location: 753859
Implantable Lead Location: 753860
Implantable Lead Model: 5034
Implantable Lead Model: 5534
Implantable Pulse Generator Implant Date: 20081023
Lead Channel Impedance Value: 1013 Ohm
Lead Channel Impedance Value: 1095 Ohm
Lead Channel Pacing Threshold Amplitude: 0.5 V
Lead Channel Pacing Threshold Amplitude: 0.5 V
Lead Channel Pacing Threshold Pulse Width: 0.4 ms
Lead Channel Pacing Threshold Pulse Width: 0.4 ms
Lead Channel Sensing Intrinsic Amplitude: 15.67 mV
Lead Channel Sensing Intrinsic Amplitude: 2 mV
Lead Channel Setting Pacing Amplitude: 2 V
Lead Channel Setting Pacing Amplitude: 2 V
Lead Channel Setting Pacing Pulse Width: 0.4 ms
Lead Channel Setting Sensing Sensitivity: 5.6 mV

## 2017-09-12 LAB — BASIC METABOLIC PANEL
BUN/Creatinine Ratio: 16 (ref 10–24)
BUN: 19 mg/dL (ref 8–27)
CO2: 19 mmol/L — ABNORMAL LOW (ref 20–29)
Calcium: 9.9 mg/dL (ref 8.6–10.2)
Chloride: 100 mmol/L (ref 96–106)
Creatinine, Ser: 1.2 mg/dL (ref 0.76–1.27)
GFR calc Af Amer: 67 mL/min/{1.73_m2} (ref >59)
GFR calc non Af Amer: 58 mL/min/{1.73_m2} — ABNORMAL LOW (ref >59)
Glucose: 90 mg/dL (ref 65–99)
Potassium: 4.6 mmol/L (ref 3.5–5.2)
Sodium: 136 mmol/L (ref 134–144)

## 2017-09-12 LAB — CBC WITH DIFFERENTIAL/PLATELET
Basophils Absolute: 0.1 10*3/uL (ref 0.0–0.2)
Basos: 1 %
EOS (ABSOLUTE): 0.1 10*3/uL (ref 0.0–0.4)
Eos: 2 %
Hematocrit: 44.2 % (ref 37.5–51.0)
Hemoglobin: 15.3 g/dL (ref 13.0–17.7)
Immature Grans (Abs): 0 10*3/uL (ref 0.0–0.1)
Immature Granulocytes: 0 %
Lymphocytes Absolute: 1.7 10*3/uL (ref 0.7–3.1)
Lymphs: 29 %
MCH: 34.9 pg — ABNORMAL HIGH (ref 26.6–33.0)
MCHC: 34.6 g/dL (ref 31.5–35.7)
MCV: 101 fL — ABNORMAL HIGH (ref 79–97)
Monocytes Absolute: 0.5 10*3/uL (ref 0.1–0.9)
Monocytes: 9 %
Neutrophils Absolute: 3.4 10*3/uL (ref 1.4–7.0)
Neutrophils: 59 %
Platelets: 201 10*3/uL (ref 150–450)
RBC: 4.38 x10E6/uL (ref 4.14–5.80)
RDW: 13.3 % (ref 12.3–15.4)
WBC: 5.8 10*3/uL (ref 3.4–10.8)

## 2017-09-25 ENCOUNTER — Ambulatory Visit: Payer: Medicare Other | Attending: Internal Medicine

## 2017-09-25 DIAGNOSIS — Z720 Tobacco use: Secondary | ICD-10-CM | POA: Diagnosis not present

## 2017-09-25 DIAGNOSIS — J449 Chronic obstructive pulmonary disease, unspecified: Secondary | ICD-10-CM | POA: Diagnosis present

## 2017-09-25 DIAGNOSIS — F17210 Nicotine dependence, cigarettes, uncomplicated: Secondary | ICD-10-CM | POA: Insufficient documentation

## 2017-09-25 MED ORDER — ALBUTEROL SULFATE (2.5 MG/3ML) 0.083% IN NEBU
2.5000 mg | INHALATION_SOLUTION | Freq: Once | RESPIRATORY_TRACT | Status: AC
  Administered 2017-09-25: 2.5 mg via RESPIRATORY_TRACT
  Filled 2017-09-25: qty 3

## 2017-10-01 NOTE — Progress Notes (Signed)
[facility] Consultation      Assessment and Plan:  Pulmonary fibrosis. 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  I suspect that this is related to his underlying rheumatoid arthritis, doubtful that methotrexate would cause this. -Given his relative lack of symptoms, and the stability of his fibrotic changes, would not initiate medications for pulmonary fibrosis at this time, we will continue to monitor.  Emphysema. - Advanced apical emphysematous changes with only minimal dyspnea on exertion. Will start rescue inhaler to use as needed.  -60+ pack years. -Continue CT lung cancer screening.  Nicotine abuse. - He has quit smoking on 04/29/17.    Date: 10/01/2017  MRN# [id number] TRESEAN MATTIX 01/03/1941  Referring Physician: Cancer center.   PEDER ALLUMS is a 77 y.o. old male seen in consultation for chief complaint of:    Chief Complaint  Patient presents with  . COPD    pt here for f/u with results of PFT.  Marland Kitchen Shortness of Breath    with exertion, pt has stopped smoking.    HPI:   The patient is a 77 year old male  with a history of atrial fibrillation on anticoagulation, rheumatoid arthritis. He went for low dose Ct chest for screening, results showed emphysema and usual interstitial pneumonia.   He has quit smoking with patches. He has continues to have dyspnea on moderate exertion such as mopping and vacuuming. He can walk a mile with the dog slowly and does fine with that. He is taking bevespi 2 puffs once daily but does not notice a difference.    Sometimes has reflux which is symptomatic every month or so,  he takes occasional medicine.  He has sinus drainage, he takes benadryl at night which helps, he takes nothing during the day.   He has RA, he is on MTX 12.5 mg weekly, and this has his symptoms completely controlled with this.    He has a history of syncope, which was controlled with a pacemaker 20 yrs ago.   **PFT 09/25/2017>> tracings personally reviewed.  FVC is 190% predicted, FEV1 is 98% predicted.  Ratio 64% there is no significant improvement with bronchodilator therapy.  Flow volume loop appears obstructed. TLC is 107% protected, ratio is normal.  DLCO severely reduced at 36%. -Overall this test shows mild emphysema with severely reduced diffusion capacity. **low-dose CT chest 2/8/1>>; bilateral severe emphysematous changes, bibasilar fibrotic changes in a typical UIP pattern, appears relatively unchanged from previous scan in 03/14/16.  Medication:    Current Outpatient Medications:  .  apixaban (ELIQUIS) 5 MG TABS tablet, Take 1 tablet (5 mg total) by mouth 2 (two) times daily., Disp: 180 tablet, Rfl: 3 .  brimonidine (ALPHAGAN) 0.15 % ophthalmic solution, Place 1 drop into both eyes 2 (two) times daily. , Disp: , Rfl:  .  Cholecalciferol (VITAMIN D3) 2000 UNITS capsule, Take 2,000 Units by mouth daily., Disp: , Rfl:  .  diltiazem (CARDIZEM) 30 MG tablet, Take 1 tablet (30 mg total) by mouth 2 (two) times daily as needed., Disp: 60 tablet, Rfl: 11 .  fenofibrate (TRICOR) 145 MG tablet, Take 1 tablet (145 mg total) by mouth daily., Disp: 90 tablet, Rfl: 3 .  FOLIC ACID PO, Take 1 tablet by mouth daily., Disp: , Rfl:  .  Glycopyrrolate-Formoterol (BEVESPI AEROSPHERE) 9-4.8 MCG/ACT AERO, Inhale 2 puffs into the lungs daily., Disp: 1 Inhaler, Rfl: 5 .  methotrexate  2.5 MG tablet, Take 12.5 mg by mouth once a week. , Disp: , Rfl:    Allergies:  Sulfa antibiotics and Penicillins      LABORATORY PANEL:   CBC No results for input(s): WBC, HGB, HCT, PLT in the last 168 hours. ------------------------------------------------------------------------------------------------------------------  Chemistries  No results for input(s): NA, K, CL, CO2, GLUCOSE, BUN, CREATININE, CALCIUM, MG, AST, ALT, ALKPHOS, BILITOT  in the last 168 hours.  Invalid input(s): GFRCGP ------------------------------------------------------------------------------------------------------------------  Cardiac Enzymes No results for input(s): TROPONINI in the last 168 hours. ------------------------------------------------------------  RADIOLOGY:  No results found.     Thank  you for the consultation and for allowing [facility] to assist in the care of your patient. Our recommendations are noted above.  Please contact us if we can be of further service.   Marda Stalker, M.D., F.C.C.P.  Board Certified in Internal Medicine, Pulmonary Medicine, [place], and Sleep Medicine.  [facility] Office Number: [phone]  10/01/2017

## 2017-10-02 ENCOUNTER — Ambulatory Visit: Payer: Medicare Other | Admitting: Internal Medicine

## 2017-10-02 ENCOUNTER — Encounter: Payer: Self-pay | Admitting: Internal Medicine

## 2017-10-02 VITALS — BP 108/80 | HR 80 | Resp 16 | Ht 69.0 in | Wt 176.0 lb

## 2017-10-02 DIAGNOSIS — J449 Chronic obstructive pulmonary disease, unspecified: Secondary | ICD-10-CM

## 2017-10-02 MED ORDER — ALBUTEROL SULFATE HFA 108 (90 BASE) MCG/ACT IN AERS: 1.0000 | INHALATION_SPRAY | Freq: Four times a day (QID) | RESPIRATORY_TRACT | 5 refills | Status: DC | PRN

## 2017-10-02 NOTE — Patient Instructions (Addendum)
Continue to be active. can use albuterol rescue as needed.  Try stopping bevespi, if you notice no difference can stay off of it.

## 2017-12-04 ENCOUNTER — Ambulatory Visit (INDEPENDENT_AMBULATORY_CARE_PROVIDER_SITE_OTHER): Payer: Medicare Other | Admitting: *Deleted

## 2017-12-04 DIAGNOSIS — I442 Atrioventricular block, complete: Secondary | ICD-10-CM

## 2017-12-05 NOTE — Progress Notes (Signed)
Remote pacemaker transmission.   

## 2017-12-28 LAB — CUP PACEART REMOTE DEVICE CHECK
Battery Impedance: 2056 Ohm
Battery Remaining Longevity: 28 mo
Battery Voltage: 2.73 V
Brady Statistic AP VP Percent: 0 %
Brady Statistic AP VS Percent: 80 %
Brady Statistic AS VP Percent: 0 %
Brady Statistic AS VS Percent: 19 %
Date Time Interrogation Session: 20191015120213
Implantable Lead Implant Date: 19941110
Implantable Lead Implant Date: 19941110
Implantable Lead Location: 753859
Implantable Lead Location: 753860
Implantable Lead Model: 5034
Implantable Lead Model: 5534
Implantable Pulse Generator Implant Date: 20081023
Lead Channel Impedance Value: 1073 Ohm
Lead Channel Impedance Value: 973 Ohm
Lead Channel Pacing Threshold Amplitude: 0.5 V
Lead Channel Pacing Threshold Pulse Width: 0.4 ms
Lead Channel Setting Pacing Amplitude: 2 V
Lead Channel Setting Pacing Amplitude: 2 V
Lead Channel Setting Pacing Pulse Width: 0.4 ms
Lead Channel Setting Sensing Sensitivity: 5.6 mV

## 2018-01-26 ENCOUNTER — Telehealth: Payer: Self-pay

## 2018-01-26 NOTE — Telephone Encounter (Signed)
Call pt regarding lung screening. Pt is a former smoker. Would like scan in the A.M. Any day starting the week of 16th. Pt denies any health issues.

## 2018-01-28 ENCOUNTER — Other Ambulatory Visit: Payer: Self-pay | Admitting: *Deleted

## 2018-01-28 DIAGNOSIS — Z122 Encounter for screening for malignant neoplasm of respiratory organs: Secondary | ICD-10-CM

## 2018-01-28 DIAGNOSIS — Z87891 Personal history of nicotine dependence: Secondary | ICD-10-CM

## 2018-01-28 DIAGNOSIS — R918 Other nonspecific abnormal finding of lung field: Secondary | ICD-10-CM

## 2018-01-30 ENCOUNTER — Other Ambulatory Visit: Payer: Self-pay | Admitting: Internal Medicine

## 2018-01-30 DIAGNOSIS — M47812 Spondylosis without myelopathy or radiculopathy, cervical region: Secondary | ICD-10-CM | POA: Insufficient documentation

## 2018-02-06 ENCOUNTER — Ambulatory Visit: Admission: RE | Admit: 2018-02-06 | Payer: Medicare Other | Source: Ambulatory Visit

## 2018-02-06 ENCOUNTER — Ambulatory Visit
Admission: RE | Admit: 2018-02-06 | Discharge: 2018-02-06 | Disposition: A | Discharge status: Home / Self Care | Payer: Medicare Other | Source: Ambulatory Visit | Attending: Nurse Practitioner | Admitting: Nurse Practitioner

## 2018-02-06 DIAGNOSIS — Z87891 Personal history of nicotine dependence: Secondary | ICD-10-CM | POA: Insufficient documentation

## 2018-02-06 DIAGNOSIS — M47812 Spondylosis without myelopathy or radiculopathy, cervical region: Secondary | ICD-10-CM | POA: Insufficient documentation

## 2018-02-06 DIAGNOSIS — R918 Other nonspecific abnormal finding of lung field: Secondary | ICD-10-CM

## 2018-02-06 DIAGNOSIS — Z122 Encounter for screening for malignant neoplasm of respiratory organs: Secondary | ICD-10-CM | POA: Diagnosis present

## 2018-02-07 ENCOUNTER — Encounter: Payer: Self-pay | Admitting: Nurse Practitioner

## 2018-02-07 ENCOUNTER — Telehealth: Payer: Self-pay | Admitting: *Deleted

## 2018-02-07 NOTE — Progress Notes (Signed)
   [facility]  Telephone:([phone] [phone]) [phone]  Patient Care Team: Pleas Koch, NP as PCP - General (Internal Medicine) Deboraha Sprang, MD as Consulting Physician (Cardiology) Marlowe Sax, MD as Referring Physician (Internal Medicine) Birder Robson, MD as Referring Physician (Ophthalmology) Oneta Rack, MD as Referring Physician (Dermatology) Salli Real, DDS as Referring Physician (Dentistry)   Name of the patient: Darryl White  [id number]  October 07, 1940   [name], 77 y.o. male who was referred to the lung cancer screening program/low-dose CT for history of use of tobacco products.  he had a low-dose chest CT on 02/06/2018 which was reported as lung RADS 4B based, suspicious primarily based on 13.7 mm nodule in the medial right upper lobe mildly progressive since previous scan concerning for primary bronchogenic neoplasm.  Additionally, new/progressive nodular opacities along the lateral right upper and middle lobes identified as indeterminate.  he will require PET scan and PFTs to further evaluate these findings and to assist in medical decision-making including possible biopsy and to optimize management. Patient has been referred to [place], RN Nurse Navigator, coordinating care.   Beckey Rutter, DNP, AGNP-C [place] at [facility] [phone] (work cell) [phone] (office)

## 2018-02-07 NOTE — Telephone Encounter (Signed)
Patient has been discussed previously in multidisciplinary thoracic conference. Note recent scan results below. After further discussion with radiation oncology and noting prior recommendations, plan is for PET scan and if nothing except slowly enlarging RLL nodule is PET Avid, patient will proceed with radiation treatment. This is due to the location of the enlarging nodule and the severe lung disease seen on imaging. Patient is aware of, and in agreement with this plan.

## 2018-02-07 NOTE — Telephone Encounter (Signed)
Noted  

## 2018-02-11 ENCOUNTER — Other Ambulatory Visit: Payer: Self-pay | Admitting: *Deleted

## 2018-02-11 DIAGNOSIS — R911 Solitary pulmonary nodule: Secondary | ICD-10-CM

## 2018-02-11 DIAGNOSIS — R918 Other nonspecific abnormal finding of lung field: Secondary | ICD-10-CM

## 2018-02-18 ENCOUNTER — Ambulatory Visit
Admission: RE | Admit: 2018-02-18 | Discharge: 2018-02-18 | Disposition: A | Discharge status: Home / Self Care | Payer: Medicare Other | Source: Ambulatory Visit | Attending: Oncology | Admitting: Oncology

## 2018-02-18 DIAGNOSIS — R918 Other nonspecific abnormal finding of lung field: Secondary | ICD-10-CM | POA: Insufficient documentation

## 2018-02-18 DIAGNOSIS — R911 Solitary pulmonary nodule: Secondary | ICD-10-CM | POA: Diagnosis not present

## 2018-02-18 DIAGNOSIS — I700 Atherosclerosis of aorta: Secondary | ICD-10-CM | POA: Insufficient documentation

## 2018-02-18 DIAGNOSIS — J439 Emphysema, unspecified: Secondary | ICD-10-CM | POA: Insufficient documentation

## 2018-02-18 LAB — GLUCOSE, CAPILLARY: Glucose-Capillary: 96 mg/dL (ref 70–99)

## 2018-02-18 MED ORDER — FLUDEOXYGLUCOSE F - 18 (FDG) INJECTION
9.2500 | Freq: Once | INTRAVENOUS | Status: AC | PRN
  Administered 2018-02-18: 9.25 via INTRAVENOUS

## 2018-02-19 ENCOUNTER — Other Ambulatory Visit: Payer: Self-pay | Admitting: *Deleted

## 2018-02-21 ENCOUNTER — Telehealth: Payer: Self-pay | Admitting: *Deleted

## 2018-02-21 ENCOUNTER — Other Ambulatory Visit: Payer: Medicare Other

## 2018-02-21 NOTE — Progress Notes (Signed)
Tumor Board Documentation  Darryl White was presented by Army Chaco, RN at our Tumor Board on 02/21/2018, which included representatives from medical oncology, radiation oncology, surgical oncology, navigation, pathology, radiology, surgical, research, pulmonology.  Darryl White currently presents for [name], for discussion with history of the following treatments: active survellience.  Additionally, we reviewed previous medical and familial history, history of present illness, and recent lab results along with all available histopathologic and imaging studies. The tumor board considered available treatment options and made the following recommendations: Active surveillance  Repeat CT in 3 months     The following procedures/referrals were also placed: No orders of the defined types were placed in this encounter.   Clinical Trial Status: not discussed   Staging used:    National site-specific guidelines   were discussed with respect to the case.  Tumor board is a meeting of clinicians from various specialty areas who evaluate and discuss patients for whom a multidisciplinary approach is being considered. Final determinations in the plan of care are those of the provider(s). The responsibility for follow up of recommendations given during tumor board is that of the provider.   Today's extended care, comprehensive team conference, Darryl White was not present for the discussion and was not examined.   Multidisciplinary Tumor Board is a multidisciplinary case peer review process.  Decisions discussed in the Multidisciplinary Tumor Board reflect the opinions of the specialists present at the conference without having examined the patient.  Ultimately, treatment and diagnostic decisions rest with the primary provider(s) and the patient.

## 2018-02-21 NOTE — Telephone Encounter (Signed)
Notified patient and wife of conference recommendation for 3 month follow up imaging of pet positive lung nodule and no other action for slowly enlarging lower lobe nodule. Patient is in agreement with this plan.

## 2018-02-22 ENCOUNTER — Ambulatory Visit: Payer: Medicare Other | Admitting: Primary Care

## 2018-02-22 ENCOUNTER — Encounter: Payer: Self-pay | Admitting: Primary Care

## 2018-02-22 ENCOUNTER — Telehealth: Payer: Self-pay | Admitting: *Deleted

## 2018-02-22 DIAGNOSIS — J441 Chronic obstructive pulmonary disease with (acute) exacerbation: Secondary | ICD-10-CM | POA: Insufficient documentation

## 2018-02-22 MED ORDER — PREDNISONE 20 MG PO TABS: ORAL_TABLET | ORAL | 0 refills | Status: DC

## 2018-02-22 MED ORDER — AZITHROMYCIN 250 MG PO TABS: ORAL_TABLET | ORAL | 0 refills | Status: DC

## 2018-02-22 NOTE — Assessment & Plan Note (Signed)
Increased shortness of breath, increased mucous production, fevers, increased cough.  65 years of smoking, quit 9 months ago. Based off of this criteria he meets treatment for antibiotic and glucocorticoid treatment.  Rx for Zpak and prednisone burst sent to pharmacy. Continue albuterol inhaler. Fluids, rest, follow up PRN.

## 2018-02-22 NOTE — Patient Instructions (Signed)
Start Azithromycin antibiotics for infection. Take 2 tablets by mouth today, then 1 tablet daily for 4 additional days.  Start prednisone tablets. Take 2 tablets daily for 5 days.  You can try taking Mucinex DM for cough and congestion.   Make sure to drink plenty of fluids and rest.  It was a pleasure to see you today!

## 2018-02-22 NOTE — Progress Notes (Signed)
Subjective:    Patient ID: Darryl White, male    DOB: Apr 10, 1940, 79 y.o.   MRN: [id number]  HPI  Darryl White is a 78 year old male with a history of rheumatoid arthritis, COPD, tobacco abuse, atrial fibrillation who presents today with a chief complaint of cough.  He also reports a fever of 100.2, increased shortness of breath, chest congestion with increased mucous production, nasal congestion. His symptoms began yesterday with symptoms of fatigue. He woke up twice during the night with a moderate amount of chest congestion.   He's not taken anything OTC for his symptoms today. He took Tylenol 500 mg last night. He's up to date on his flu and pneumonia vaccinations. He did quit smoking in March 2019.  Review of Systems  Constitutional: Positive for chills, fatigue and fever.  HENT: Positive for congestion. Negative for sinus pressure.   Respiratory: Positive for cough and shortness of breath.   Cardiovascular: Negative for chest pain.       Past Medical History:  Diagnosis Date  . Chicken pox   . 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 Diverticulitis   . History of stomach ulcers   . Hyperlipidemia   . Lymphocytic colitis   . Orthostatic hypotension   . Pancreatitis   . Paroxysmal atrial fibrillation (HCC)      Social History   Socioeconomic History  . Marital status: Married    Spouse name: Not on file  . Number of children: 2  . Years of education: Not on file  . Highest education level: Not on file  Occupational History  . Occupation: Retired  Scientific laboratory technician  . Financial resource strain: Not on file  . Food insecurity:    Worry: Not on file    Inability: Not on file  . Transportation needs:    Medical: Not on file    Non-medical: Not on file  Tobacco Use  . Smoking status: Former Smoker    Packs/day: 1.00    Years: 57.00    Pack years: 57.00    Last attempt to quit: 04/29/2017    Years since quitting: 0.8  . Smokeless tobacco: Never  Used  Substance and Sexual Activity  . Alcohol use: Yes    Alcohol/week: 0.0 standard drinks  . Drug use: No  . Sexual activity: Never  Lifestyle  . Physical activity:    Days per week: Not on file    Minutes per session: Not on file  . Stress: Not on file  Relationships  . Social connections:    Talks on phone: Not on file    Gets together: Not on file    Attends religious service: Not on file    Active member of club or organization: Not on file    Attends meetings of clubs or organizations: Not on file    Relationship status: Not on file  . Intimate partner violence:    Fear of current or ex partner: Not on file    Emotionally abused: Not on file    Physically abused: Not on file    Forced sexual activity: Not on file  Other Topics Concern  . Not on file  Social History Narrative   Married.   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      Does not have a living will.   Desires CPR, does not want prolonged life support if futile.    Past Surgical History:  Procedure  Laterality Date  . APPENDECTOMY    . BACK SURGERY    . large intestine block    . PACEMAKER INSERTION     MDT dual chamber pacemaker  . TONSILLECTOMY AND ADENOIDECTOMY      Family History  Problem Relation Age of Onset  . Cancer Mother   . Heart disease Father     Allergies  Allergen Reactions  . Sulfa Antibiotics Hives and Itching  . Penicillins Hives and Palpitations    Current Outpatient Medications on File Prior to Visit  Medication Sig Dispense Refill  . albuterol (PROAIR HFA) 108 (90 Base) MCG/ACT inhaler Inhale 1-2 puffs into the lungs every 6 (six) hours as needed for wheezing or shortness of breath. 1 Inhaler 5  . apixaban (ELIQUIS) 5 MG TABS tablet Take 1 tablet (5 mg total) by mouth 2 (two) times daily. 180 tablet 3  . brimonidine (ALPHAGAN) 0.15 % ophthalmic solution Place 1 drop into both eyes 2 (two) times daily.     . Cholecalciferol (VITAMIN D3) 2000 UNITS capsule  Take 2,000 Units by mouth daily.    Marland Kitchen diltiazem (CARDIZEM) 30 MG tablet Take 1 tablet (30 mg total) by mouth 2 (two) times daily as needed. 60 tablet 11  . fenofibrate (TRICOR) 145 MG tablet Take 1 tablet (145 mg total) by mouth daily. 90 tablet 3  . FOLIC ACID PO Take 1 tablet by mouth daily.    . Glycopyrrolate-Formoterol (BEVESPI AEROSPHERE) 9-4.8 MCG/ACT AERO Inhale 2 puffs into the lungs daily. 1 Inhaler 5  . methotrexate 2.5 MG tablet Take 12.5 mg by mouth once a week.      No current facility-administered medications on file prior to visit.     BP 126/80   Pulse 98   Temp 99.9 F (37.7 C) (Oral)   Ht 5\' 6"  (1.676 m)   Wt 178 lb 12 oz (81.1 kg)   SpO2 90%   BMI 28.85 kg/m    Objective:   Physical Exam  Constitutional: He appears well-nourished. He does not appear ill.  HENT:  Right Ear: Tympanic membrane and ear canal normal.  Left Ear: Tympanic membrane and ear canal normal.  Nose: No mucosal edema. Right sinus exhibits no maxillary sinus tenderness and no frontal sinus tenderness. Left sinus exhibits no maxillary sinus tenderness and no frontal sinus tenderness.  Mouth/Throat: Oropharynx is clear and moist.  Neck: Neck supple.  Cardiovascular: Normal rate and regular rhythm.  Respiratory: Effort normal. He has no wheezes.  Productive cough during exam  Skin: Skin is warm and dry.           Assessment & Plan:

## 2018-02-22 NOTE — Telephone Encounter (Signed)
Spoke to pts wife, Opal Sidles who states pt "has a terrible cough." He has a Hx of COPD and currently using his inhaler. He has also developed a fever of 100.2. Spoke to [facility], who agreed to see pt at 1000 02/22/2018

## 2018-02-24 ENCOUNTER — Other Ambulatory Visit: Payer: Self-pay | Admitting: Primary Care

## 2018-02-24 DIAGNOSIS — I951 Orthostatic hypotension: Secondary | ICD-10-CM

## 2018-02-24 DIAGNOSIS — E785 Hyperlipidemia, unspecified: Secondary | ICD-10-CM

## 2018-02-28 ENCOUNTER — Ambulatory Visit (INDEPENDENT_AMBULATORY_CARE_PROVIDER_SITE_OTHER): Payer: Medicare Other

## 2018-02-28 ENCOUNTER — Other Ambulatory Visit: Payer: Self-pay | Admitting: Primary Care

## 2018-02-28 ENCOUNTER — Ambulatory Visit: Payer: Medicare Other

## 2018-02-28 VITALS — BP 110/68 | HR 74 | Temp 99.0°F | Ht 67.5 in | Wt 176.2 lb

## 2018-02-28 DIAGNOSIS — E785 Hyperlipidemia, unspecified: Secondary | ICD-10-CM

## 2018-02-28 DIAGNOSIS — Z0000 Encounter for general adult medical examination without abnormal findings: Secondary | ICD-10-CM

## 2018-02-28 LAB — LIPID PANEL
Cholesterol: 150 mg/dL (ref 0–200)
HDL: 53.9 mg/dL (ref >39.00)
LDL Cholesterol: 78 mg/dL (ref 0–99)
NonHDL: 96.53
Total CHOL/HDL Ratio: 3
Triglycerides: 94 mg/dL (ref 0.0–149.0)
VLDL: 18.8 mg/dL (ref 0.0–40.0)

## 2018-02-28 LAB — COMPREHENSIVE METABOLIC PANEL
ALT: 31 U/L (ref 0–53)
AST: 28 U/L (ref 0–37)
Albumin: 4.1 g/dL (ref 3.5–5.2)
Alkaline Phosphatase: 44 U/L (ref 39–117)
BUN: 27 mg/dL — ABNORMAL HIGH (ref 6–23)
CO2: 27 mEq/L (ref 19–32)
Calcium: 9.8 mg/dL (ref 8.4–10.5)
Chloride: 99 mEq/L (ref 96–112)
Creatinine, Ser: 1.39 mg/dL (ref 0.40–1.50)
GFR: 52.64 mL/min — ABNORMAL LOW (ref >60.00)
Glucose, Bld: 88 mg/dL (ref 70–99)
Potassium: 4.9 mEq/L (ref 3.5–5.1)
Sodium: 133 mEq/L — ABNORMAL LOW (ref 135–145)
Total Bilirubin: 0.7 mg/dL (ref 0.2–1.2)
Total Protein: 6.9 g/dL (ref 6.0–8.3)

## 2018-02-28 NOTE — Progress Notes (Signed)
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 Beginning Tramadol 50 mg PO for pain management.   Nurse concerns:  None  Next PCP appt:   03/04/18 @ 0820

## 2018-02-28 NOTE — Progress Notes (Signed)
I reviewed health advisor's note, was available for consultation, and agree with documentation and plan.  

## 2018-02-28 NOTE — Progress Notes (Signed)
Subjective:   Darryl White is a 78 y.o. male who presents for Medicare Annual/Subsequent preventive examination.  Review of Systems:  N/A Cardiac Risk Factors include: advanced age (>96men, >64 women);dyslipidemia;male gender     Objective:    Vitals: BP 110/68 (BP Location: Right Arm, Patient Position: Sitting, Cuff Size: Normal)   Pulse 74   Temp 99 F (37.2 C) (Oral)   Ht 5' 7.5" (1.715 m) Comment: shoes  Wt 176 lb 4 oz (79.9 kg)   SpO2 98%   BMI 27.20 kg/m   Body mass index is 27.2 kg/m.  Advanced Directives 02/28/2018 02/22/2017 02/08/2016 07/06/2015 10/13/2014  Does Patient Have a Medical Advance Directive? No No No Yes Yes  Type of Advance Directive - - - - Living will  Does patient want to make changes to medical advance directive? - - - - No - Patient declined  Copy of [place] in Chart? - - - No - copy requested No - copy requested  Would patient like information on creating a medical advance directive? No - Patient declined Yes (MAU/Ambulatory/Procedural Areas - Information given) - - -    Tobacco Social History   Tobacco Use  Smoking Status Former Smoker  . Packs/day: 1.00  . Years: 57.00  . Pack years: 57.00  . Last attempt to quit: 04/29/2017  . Years since quitting: 0.8  Smokeless Tobacco Never Used     Counseling given: No   Clinical Intake:  Pre-visit preparation completed: Yes  Pain : 0-10 Pain Score: 4  Pain Type: Acute pain Pain Location: Neck     Nutritional Status: BMI 25 -29 Overweight Nutritional Risks: None Diabetes: No  How often do you need to have someone help you when you read instructions, pamphlets, or other written materials from your doctor or pharmacy?: 1 - Never What is the last grade level you completed in school?: Master degree  Interpreter Needed?: No  Comments: pt lives with spouse Information entered by :: LPinson, LPN  Past Medical History:  Diagnosis Date  . Arthritis 2019   dx of  osteoarthritis and rhemautoid arthritis  . Cataract 2019   left eye  . Chicken pox   . 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 Diverticulitis   . 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 History of stomach ulcers   . Hyperlipidemia   . Lymphocytic colitis   . Orthostatic hypotension   . Pancreatitis   . Paroxysmal atrial fibrillation [facility])    Past Surgical History:  Procedure Laterality Date  . APPENDECTOMY    . BACK SURGERY    . large intestine block    . PACEMAKER INSERTION     MDT dual chamber pacemaker  . TONSILLECTOMY AND ADENOIDECTOMY     Family History  Problem Relation Age of Onset  . Cancer Mother   . Heart disease Father    Social History   Socioeconomic History  . Marital status: Married    Spouse name: Not on file  . Number of children: 2  . Years of education: Not on file  . Highest education level: Not on file  Occupational History  . Occupation: Retired  Scientific laboratory technician  . Financial resource strain: Not on file  . Food insecurity:    Worry: Not on file    Inability: Not on file  . Transportation needs:    Medical: Not on file    Non-medical: Not on  file  Tobacco Use  . Smoking status: Former Smoker    Packs/day: 1.00    Years: 57.00    Pack years: 57.00    Last attempt to quit: 04/29/2017    Years since quitting: 0.8  . Smokeless tobacco: Never Used  Substance and Sexual Activity  . Alcohol use: Yes    Alcohol/week: 5.0 standard drinks    Types: 5 Glasses of wine per week  . Drug use: No  . Sexual activity: Never  Lifestyle  . Physical activity:    Days per week: Not on file    Minutes per session: Not on file  . Stress: Not on file  Relationships  . Social connections:    Talks on phone: Not on file    Gets together: Not on file    Attends religious service: Not on file    Active member of club or organization: Not on file    Attends meetings of clubs or organizations: Not on file    Relationship status: Not on file    Other Topics Concern  . Not on file  Social History Narrative   Married.   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      Does not have a living will.   Desires CPR, does not want prolonged life support if futile.    Outpatient Encounter Medications as of 02/28/2018  Medication Sig  . albuterol (PROAIR HFA) 108 (90 Base) MCG/ACT inhaler Inhale 1-2 puffs into the lungs every 6 (six) hours as needed for wheezing or shortness of breath.  Marland Kitchen apixaban (ELIQUIS) 5 MG TABS tablet Take 1 tablet (5 mg total) by mouth 2 (two) times daily.  . brimonidine (ALPHAGAN) 0.15 % ophthalmic solution Place 1 drop into both eyes 2 (two) times daily.   . Cholecalciferol (VITAMIN D3) 2000 UNITS capsule Take 2,000 Units by mouth daily.  Marland Kitchen diltiazem (CARDIZEM) 30 MG tablet Take 1 tablet (30 mg total) by mouth 2 (two) times daily as needed.  . fenofibrate (TRICOR) 145 MG tablet Take 1 tablet (145 mg total) by mouth daily.  Marland Kitchen FOLIC ACID PO Take 1 tablet by mouth daily.  . Glycopyrrolate-Formoterol (BEVESPI AEROSPHERE) 9-4.8 MCG/ACT AERO Inhale 2 puffs into the lungs daily.  . methotrexate 2.5 MG tablet Take 12.5 mg by mouth once a week.   . traMADol (ULTRAM) 50 MG tablet Take by mouth.  . [DISCONTINUED] azithromycin (ZITHROMAX) 250 MG tablet Take 2 tablets by mouth today, then 1 tablet daily for 4 additional days.  . [DISCONTINUED] predniSONE (DELTASONE) 20 MG tablet Take 2 tablets daily for 5 days.   No facility-administered encounter medications on file as of 02/28/2018.     Activities of Daily Living In your present state of health, do you have any difficulty performing the following activities: 02/28/2018  Hearing? N  Vision? N  Difficulty concentrating or making decisions? N  Walking or climbing stairs? Y  Comment SOB when climbing stairs  Dressing or bathing? N  Doing errands, shopping? N  Preparing Food and eating ? N  Using the Toilet? N  In the past six months, have you accidently leaked  urine? N  Do you have problems with loss of bowel control? N  Managing your Medications? N  Managing your Finances? N  Housekeeping or managing your Housekeeping? N  Some recent data might be hidden    Patient Care Team: Pleas Koch, NP as PCP - General (Internal Medicine) Deboraha Sprang, MD as Consulting Physician (Cardiology) Pined[place],  Christele, MD as Referring Physician (Internal Medicine) Birder Robson, MD as Referring Physician (Ophthalmology) Oneta Rack, MD as Referring Physician (Dermatology) Salli Real, DDS as Referring Physician (Dentistry)   Assessment:   This is a routine wellness examination for [place].   Hearing Screening   125Hz  250Hz  500Hz  1000Hz  2000Hz  3000Hz  4000Hz  6000Hz  8000Hz   Right ear:   40 40 40  40    Left ear:   40 40 40  40    Vision Screening Comments: Vision exam in Nov 2019 with Dr. George Ina   Exercise Activities and Dietary recommendations Current Exercise Habits: Home exercise routine, Type of exercise: walking, Time (Minutes): 30, Frequency (Times/Week): 7, Weekly Exercise (Minutes/Week): 210, Intensity: Mild, Exercise limited by: None identified  Goals    . Increase physical activity     Starting 02/28/2018, I will continue to walk at least 30 minutes daily.        Fall Risk Fall Risk  02/28/2018 02/22/2017 02/08/2016 10/21/2014  Falls in the past year? 0 No No No   Depression Screen PHQ 2/9 Scores 02/28/2018 02/22/2017 02/08/2016 10/21/2014  PHQ - 2 Score 0 0 0 0  PHQ- 9 Score 0 0 - -    Cognitive Function MMSE - Mini Mental State Exam 02/28/2018 02/22/2017 02/08/2016  Orientation to time 5 5 5   Orientation to Place 5 5 5   Registration 3 3 3   Attention/ Calculation 0 0 0  Recall 3 3 3   Language- name 2 objects 0 0 0  Language- repeat 1 1 1   Language- follow 3 step command 3 3 3   Language- read & follow direction 0 0 0  Write a sentence 0 0 0  Copy design 0 0 0  Total score 20 20 20        PLEASE NOTE: A  Mini-Cog screen was completed. Maximum score is 20. A value of 0 denotes this part of [place] MMSE was not completed or the patient failed this part of the Mini-Cog screening.   Mini-Cog Screening Orientation to Time - Max 5 pts Orientation to Place - Max 5 pts Registration - Max 3 pts Recall - Max 3 pts Language Repeat - Max 1 pts Language Follow 3 Step Command - Max 3 pts   Immunization History  Administered Date(s) Administered  . Influenza, Seasonal, Injecte, Preservative Fre 12/09/2017  . Influenza,inj,Quad PF,6+ Mos 12/15/2016  . Influenza-Unspecified 11/24/2015  . Pneumococcal Conjugate-13 01/30/2014  . Pneumococcal Polysaccharide-23 11/25/2015  . Td 05/04/2016  . Zoster 10/21/2011    Screening Tests Health Maintenance  Topic Date Due  . COLONOSCOPY  07/05/2020  . TETANUS/TDAP  05/05/2026  . INFLUENZA VACCINE  Completed  . PNA vac Low Risk Adult  Completed       Plan:  I have personally reviewed, addressed, and noted the following in the patient's chart:  A. Medical and social history B. Use of alcohol, tobacco or illicit drugs  C. Current medications and supplements D. Functional ability and status E.  Nutritional status F.  Physical activity G. Advance directives H. List of other physicians I.  Hospitalizations, surgeries, and ER visits in previous 12 months J.  [place] to include hearing, vision, cognitive, depression L. Referrals and appointments - none  In addition, I have reviewed and discussed with patient certain preventive protocols, quality metrics, and best practice recommendations. A written personalized care plan for preventive services as well as general preventive health recommendations were provided to patient.  See attached scanned questionnaire for additional  information.   Signed,   Lindell Noe, MHA, BS, LPN Health Coach

## 2018-02-28 NOTE — Patient Instructions (Signed)
Darryl White , Thank you for taking time to come for your Medicare Wellness Visit. I appreciate your ongoing commitment to your health goals. Please review the following plan we discussed and let me know if I can assist you in the future.   These are the goals we discussed: Goals    . Increase physical activity     Starting 02/28/2018, I will continue to walk at least 30 minutes daily.        This is a list of the screening recommended for you and due dates:  Health Maintenance  Topic Date Due  . Colon Cancer Screening  07/05/2020  . Tetanus Vaccine  05/05/2026  . Flu Shot  Completed  . Pneumonia vaccines  Completed    Preventive Care for Adults  A healthy lifestyle and preventive care can promote health and wellness. Preventive health guidelines for adults include the following key practices.  . A routine yearly physical is a good way to check with your health care provider about your health and preventive screening. It is a chance to share any concerns and updates on your health and to receive a thorough exam.  . Visit your dentist for a routine exam and preventive care every 6 months. Brush your teeth twice a day and floss once a day. Good oral hygiene prevents tooth decay and gum disease.  . The frequency of eye exams is based on your age, health, family medical history, use  of contact lenses, and other factors. Follow your health care provider's recommendations for frequency of eye exams.  . Eat a healthy diet. Foods like vegetables, fruits, whole grains, low-fat dairy products, and lean protein foods contain the nutrients you need without too many calories. Decrease your intake of foods high in solid fats, added sugars, and salt. Eat the right amount of calories for you. Get information about a proper diet from your health care provider, if necessary.  . Regular physical exercise is one of the most important things you can do for your health. Most adults should get at least 150  minutes of moderate-intensity exercise (any activity that increases your heart rate and causes you to sweat) each week. In addition, most adults need muscle-strengthening exercises on 2 or more days a week.  Silver Sneakers may be a benefit available to you. To determine eligibility, you may visit the website: www.silversneakers.com or contact program at 303 548 4133 Mon-Fri between 8AM-8PM.   . Maintain a healthy weight. The body mass index (BMI) is a screening tool to identify possible weight problems. It provides an estimate of body fat based on height and weight. Your health care provider can find your BMI and can help you achieve or maintain a healthy weight.   For adults 20 years and older: ? A BMI below 18.5 is considered underweight. ? A BMI of 18.5 to 24.9 is normal. ? A BMI of 25 to 29.9 is considered overweight. ? A BMI of 30 and above is considered obese.   . Maintain normal blood lipids and cholesterol levels by exercising and minimizing your intake of saturated fat. Eat a balanced diet with plenty of fruit and vegetables. Blood tests for lipids and cholesterol should begin at age 66 and be repeated every 5 years. If your lipid or cholesterol levels are high, you are over 50, or you are at high risk for heart disease, you may need your cholesterol levels checked more frequently. Ongoing high lipid and cholesterol levels should be treated with medicines if diet  and exercise are not working.  . If you smoke, find out from your health care provider how to quit. If you do not use tobacco, please do not start.  . If you choose to drink alcohol, please do not consume more than 2 drinks per day. One drink is considered to be 12 ounces (355 mL) of beer, 5 ounces (148 mL) of wine, or 1.5 ounces (44 mL) of liquor.  . If you are 76-81 years old, ask your health care provider if you should take aspirin to prevent strokes.  . Use sunscreen. Apply sunscreen liberally and repeatedly throughout  the day. You should seek shade when your shadow is shorter than you. Protect yourself by wearing long sleeves, pants, a wide-brimmed hat, and sunglasses year round, whenever you are outdoors.  . Once a month, do a whole body skin exam, using a mirror to look at the skin on your back. Tell your health care provider of new moles, moles that have irregular borders, moles that are larger than a pencil eraser, or moles that have changed in shape or color.

## 2018-03-02 ENCOUNTER — Other Ambulatory Visit: Payer: Self-pay | Admitting: Internal Medicine

## 2018-03-02 DIAGNOSIS — I480 Paroxysmal atrial fibrillation: Secondary | ICD-10-CM

## 2018-03-04 ENCOUNTER — Encounter: Payer: Self-pay | Admitting: Primary Care

## 2018-03-04 ENCOUNTER — Ambulatory Visit (INDEPENDENT_AMBULATORY_CARE_PROVIDER_SITE_OTHER): Payer: Medicare Other | Admitting: Primary Care

## 2018-03-04 VITALS — BP 122/72 | HR 68 | Temp 98.1°F | Ht 66.0 in | Wt 178.5 lb

## 2018-03-04 DIAGNOSIS — M150 Primary generalized (osteo)arthritis: Secondary | ICD-10-CM

## 2018-03-04 DIAGNOSIS — E781 Pure hyperglyceridemia: Secondary | ICD-10-CM

## 2018-03-04 DIAGNOSIS — J441 Chronic obstructive pulmonary disease with (acute) exacerbation: Secondary | ICD-10-CM

## 2018-03-04 DIAGNOSIS — I480 Paroxysmal atrial fibrillation: Secondary | ICD-10-CM | POA: Diagnosis not present

## 2018-03-04 DIAGNOSIS — Z0000 Encounter for general adult medical examination without abnormal findings: Secondary | ICD-10-CM

## 2018-03-04 DIAGNOSIS — E785 Hyperlipidemia, unspecified: Secondary | ICD-10-CM

## 2018-03-04 DIAGNOSIS — Z950 Presence of cardiac pacemaker: Secondary | ICD-10-CM

## 2018-03-04 DIAGNOSIS — M159 Polyosteoarthritis, unspecified: Secondary | ICD-10-CM

## 2018-03-04 DIAGNOSIS — Z720 Tobacco use: Secondary | ICD-10-CM

## 2018-03-04 DIAGNOSIS — Z86010 Personal history of colonic polyps: Secondary | ICD-10-CM

## 2018-03-04 DIAGNOSIS — N289 Disorder of kidney and ureter, unspecified: Secondary | ICD-10-CM

## 2018-03-04 NOTE — Assessment & Plan Note (Signed)
Compliant to fenofibrate daily. Recent LDL of 78, trigs of 94. Continue same.

## 2018-03-04 NOTE — Progress Notes (Signed)
Subjective:    Patient ID: Darryl White, male    DOB: 09/16/1940, 78 y.o.   MRN: [id number]  HPI  Darryl White is a 78 year old male who presents today for complete physical.  Immunizations: -Tetanus: Completed in 2018 -Influenza: Completed this season  -Pneumonia: Completed in 2017, 2015 -Shingles: Completed in 2013  Diet: He endorses a fair diet Breakfast: Yogurt, cereal  Lunch: Sandwich Dinner: Vegetables, meat, starch Snacks: None Desserts: Twice weekly  Beverages: Water  Exercise: He is not exercising  Eye exam: Completes annually  Dental exam: Completes semi-annually  Colonoscopy: Completed in 2017, due in 2022  BP Readings from Last 3 Encounters:  03/04/18 122/72  02/28/18 110/68  02/22/18 126/80      Review of Systems  Constitutional: Negative for unexpected weight change.  HENT: Negative for rhinorrhea.   Respiratory: Negative for cough.        Shortness of breath with exertion, chronic.   Cardiovascular: Negative for chest pain.  Gastrointestinal: Negative for constipation and diarrhea.  Genitourinary: Negative for difficulty urinating.  Musculoskeletal: Positive for arthralgias and neck pain. Negative for myalgias.  Skin: Negative for rash.  Allergic/Immunologic: Negative for environmental allergies.  Neurological: Negative for dizziness, numbness and headaches.  Psychiatric/Behavioral: The patient is not nervous/anxious.        Past Medical History:  Diagnosis Date  . Arthritis 2019   dx of osteoarthritis and rhemautoid arthritis  . Cataract 2019   left eye  . Chicken pox   . 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 Diverticulitis   . 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 History of stomach ulcers   . Hyperlipidemia   . Lymphocytic colitis   . Orthostatic hypotension   . Pancreatitis   . Paroxysmal atrial fibrillation (HCC)      Social History   Socioeconomic History  . Marital status: Married    Spouse name: Not  on file  . Number of children: 2  . Years of education: Not on file  . Highest education level: Not on file  Occupational History  . Occupation: Retired  Scientific laboratory technician  . Financial resource strain: Not on file  . Food insecurity:    Worry: Not on file    Inability: Not on file  . Transportation needs:    Medical: Not on file    Non-medical: Not on file  Tobacco Use  . Smoking status: Former Smoker    Packs/day: 1.00    Years: 57.00    Pack years: 57.00    Last attempt to quit: 04/29/2017    Years since quitting: 0.8  . Smokeless tobacco: Never Used  Substance and Sexual Activity  . Alcohol use: Yes    Alcohol/week: 5.0 standard drinks    Types: 5 Glasses of wine per week  . Drug use: No  . Sexual activity: Never  Lifestyle  . Physical activity:    Days per week: Not on file    Minutes per session: Not on file  . Stress: Not on file  Relationships  . Social connections:    Talks on phone: Not on file    Gets together: Not on file    Attends religious service: Not on file    Active member of club or organization: Not on file    Attends meetings of clubs or organizations: Not on file    Relationship status: Not on file  . Intimate partner violence:  Fear of current or ex partner: Not on file    Emotionally abused: Not on file    Physically abused: Not on file    Forced sexual activity: Not on file  Other Topics Concern  . Not on file  Social History Narrative   Married.   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      Does not have a living will.   Desires CPR, does not want prolonged life support if futile.    Past Surgical History:  Procedure Laterality Date  . APPENDECTOMY    . BACK SURGERY    . large intestine block    . PACEMAKER INSERTION     MDT dual chamber pacemaker  . TONSILLECTOMY AND ADENOIDECTOMY      Family History  Problem Relation Age of Onset  . Cancer Mother   . Heart disease Father     Allergies  Allergen Reactions  .  Sulfa Antibiotics Hives and Itching  . Penicillins Hives and Palpitations    Current Outpatient Medications on File Prior to Visit  Medication Sig Dispense Refill  . albuterol (PROAIR HFA) 108 (90 Base) MCG/ACT inhaler Inhale 1-2 puffs into the lungs every 6 (six) hours as needed for wheezing or shortness of breath. 1 Inhaler 5  . brimonidine (ALPHAGAN) 0.15 % ophthalmic solution Place 1 drop into both eyes 2 (two) times daily.     . Cholecalciferol (VITAMIN D3) 2000 UNITS capsule Take 2,000 Units by mouth daily.    Marland Kitchen diltiazem (CARDIZEM) 30 MG tablet Take 1 tablet (30 mg total) by mouth 2 (two) times daily as needed. 60 tablet 11  . fenofibrate (TRICOR) 145 MG tablet TAKE 1 TABLET BY MOUTH ONCE DAILY 90 tablet 3  . FOLIC ACID PO Take 1 tablet by mouth daily.    . Glycopyrrolate-Formoterol (BEVESPI AEROSPHERE) 9-4.8 MCG/ACT AERO Inhale 2 puffs into the lungs daily. 1 Inhaler 5  . methotrexate 2.5 MG tablet Take 12.5 mg by mouth once a week.     . traMADol (ULTRAM) 50 MG tablet Take by mouth.    Arne Cleveland 5 MG TABS tablet TAKE 1 TABLET BY MOUTH TWICE DAILY 180 tablet 1   No current facility-administered medications on file prior to visit.     BP 122/72   Pulse 68   Temp 98.1 F (36.7 C) (Oral)   Ht 5\' 6"  (1.676 m)   Wt 178 lb 8 oz (81 kg)   SpO2 94%   BMI 28.81 kg/m    Objective:   Physical Exam  Constitutional: He is oriented to person, place, and time. He appears well-nourished.  HENT:  Mouth/Throat: No oropharyngeal exudate.  Eyes: Pupils are equal, round, and reactive to light. EOM are normal.  Neck: Neck supple. No thyromegaly present.  Cardiovascular: Normal rate and regular rhythm.  Respiratory: Effort normal and breath sounds normal.  GI: Soft. Bowel sounds are normal. There is no abdominal tenderness.  Musculoskeletal: Normal range of motion.  Neurological: He is alert and oriented to person, place, and time.  Skin: Skin is warm and dry.  Psychiatric: He has a  normal mood and affect.           Assessment & Plan:

## 2018-03-04 NOTE — Assessment & Plan Note (Signed)
Immunizations UTD. Colonoscopy UTD. PSA UTD. Recommended regular exercise, healthy diet. Exam unremarkable. Labs reviewed. Follow up in 1 year for CPE.

## 2018-03-04 NOTE — Assessment & Plan Note (Signed)
Due for repeat colonoscopy in 2022.

## 2018-03-04 NOTE — Assessment & Plan Note (Signed)
Compliant to methotrexate weekly. Following with rheumatology.

## 2018-03-04 NOTE — Assessment & Plan Note (Signed)
Recovered. Exam unremarkable.

## 2018-03-04 NOTE — Assessment & Plan Note (Signed)
Recent trigs of 94. Continue fenofibrate daily.

## 2018-03-04 NOTE — Assessment & Plan Note (Signed)
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 Recently evaluated by pain management, wasn't a candidate for steroid injections.  Placed on Tramadol 50 mg for 7 days, no improvement. He will touch base with pain management today.

## 2018-03-04 NOTE — Assessment & Plan Note (Signed)
Quit smoking in March 2019. Commended him on this.

## 2018-03-04 NOTE — Assessment & Plan Note (Signed)
Following with cardiology.  Compliant to Eliquis, diltiazem. Last pacemaker check in October 2019, due again today.

## 2018-03-04 NOTE — Assessment & Plan Note (Signed)
Noted on recent labs with GFR of 52. GFR of 66 one year ago. Is not taking NSAID's.  Start with repeat BMP in 1-2 months. Discussed to continue to avoid NSAID's, continue water consumption.

## 2018-03-04 NOTE — Assessment & Plan Note (Signed)
Pacemaker check in October 2019 and normal, due again today.

## 2018-03-04 NOTE — Telephone Encounter (Signed)
Pt last saw Dr Caryl Comes 09/11/17, last labs 02/28/18, age 78, weight 81kg, based on specified criteria pt is on appropriate dosage of Eliquis 5mg  BID.  Will refill rx.

## 2018-03-04 NOTE — Patient Instructions (Signed)
Start exercising. You should be getting 150 minutes of exercise weekly.  It's important to improve your diet by reducing consumption of fast food, fried food, processed snack foods, sugary drinks. Increase consumption of fresh vegetables and fruits, whole grains, water.  Ensure you are drinking 64 ounces of water daily.  Follow up with pain management as discussed.   Schedule a lab only appointment for 2 months to repeat your kidney function.   It was a pleasure to see you today!

## 2018-03-05 ENCOUNTER — Ambulatory Visit (INDEPENDENT_AMBULATORY_CARE_PROVIDER_SITE_OTHER): Payer: Medicare Other

## 2018-03-05 DIAGNOSIS — I442 Atrioventricular block, complete: Secondary | ICD-10-CM | POA: Diagnosis not present

## 2018-03-06 NOTE — Progress Notes (Signed)
Remote pacemaker transmission.   

## 2018-03-09 LAB — CUP PACEART REMOTE DEVICE CHECK
Battery Impedance: 2310 Ohm
Battery Remaining Longevity: 25 mo
Battery Voltage: 2.71 V
Brady Statistic AP VP Percent: 0 %
Brady Statistic AP VS Percent: 79 %
Brady Statistic AS VP Percent: 0 %
Brady Statistic AS VS Percent: 20 %
Date Time Interrogation Session: 20200114153403
Implantable Lead Implant Date: 19941110
Implantable Lead Implant Date: 19941110
Implantable Lead Location: 753859
Implantable Lead Location: 753860
Implantable Lead Model: 5034
Implantable Lead Model: 5534
Implantable Pulse Generator Implant Date: 20081023
Lead Channel Impedance Value: 1034 Ohm
Lead Channel Impedance Value: 1205 Ohm
Lead Channel Pacing Threshold Amplitude: 0.375 V
Lead Channel Pacing Threshold Pulse Width: 0.4 ms
Lead Channel Setting Pacing Amplitude: 2 V
Lead Channel Setting Pacing Amplitude: 2 V
Lead Channel Setting Pacing Pulse Width: 0.4 ms
Lead Channel Setting Sensing Sensitivity: 5.6 mV

## 2018-04-24 ENCOUNTER — Other Ambulatory Visit: Payer: Self-pay | Admitting: Primary Care

## 2018-04-24 DIAGNOSIS — N289 Disorder of kidney and ureter, unspecified: Secondary | ICD-10-CM

## 2018-04-25 ENCOUNTER — Encounter: Payer: Self-pay | Admitting: *Deleted

## 2018-05-03 ENCOUNTER — Other Ambulatory Visit (INDEPENDENT_AMBULATORY_CARE_PROVIDER_SITE_OTHER): Payer: Medicare Other

## 2018-05-03 ENCOUNTER — Other Ambulatory Visit: Payer: Self-pay

## 2018-05-03 DIAGNOSIS — N289 Disorder of kidney and ureter, unspecified: Secondary | ICD-10-CM | POA: Diagnosis not present

## 2018-05-03 LAB — BASIC METABOLIC PANEL
BUN: 26 mg/dL — ABNORMAL HIGH (ref 6–23)
CO2: 23 mEq/L (ref 19–32)
Calcium: 9.2 mg/dL (ref 8.4–10.5)
Chloride: 102 mEq/L (ref 96–112)
Creatinine, Ser: 1.31 mg/dL (ref 0.40–1.50)
GFR: 53.01 mL/min — ABNORMAL LOW (ref >60.00)
Glucose, Bld: 95 mg/dL (ref 70–99)
Potassium: 4.4 mEq/L (ref 3.5–5.1)
Sodium: 134 mEq/L — ABNORMAL LOW (ref 135–145)

## 2018-05-10 ENCOUNTER — Telehealth: Payer: Self-pay | Admitting: *Deleted

## 2018-05-10 DIAGNOSIS — Z87891 Personal history of nicotine dependence: Secondary | ICD-10-CM

## 2018-05-10 DIAGNOSIS — R918 Other nonspecific abnormal finding of lung field: Secondary | ICD-10-CM

## 2018-05-10 DIAGNOSIS — Z122 Encounter for screening for malignant neoplasm of respiratory organs: Secondary | ICD-10-CM

## 2018-05-10 NOTE — Telephone Encounter (Signed)
Patient is contacted and is agreeable for lung screening nodule follow up imaging to be scheduled.

## 2018-05-22 ENCOUNTER — Ambulatory Visit
Admission: RE | Admit: 2018-05-22 | Discharge: 2018-05-22 | Disposition: A | Discharge status: Home / Self Care | Payer: Medicare Other | Source: Ambulatory Visit | Attending: Oncology | Admitting: Oncology

## 2018-05-22 ENCOUNTER — Other Ambulatory Visit: Payer: Self-pay

## 2018-05-22 DIAGNOSIS — Z87891 Personal history of nicotine dependence: Secondary | ICD-10-CM | POA: Diagnosis present

## 2018-05-22 DIAGNOSIS — Z122 Encounter for screening for malignant neoplasm of respiratory organs: Secondary | ICD-10-CM | POA: Diagnosis not present

## 2018-05-22 DIAGNOSIS — R918 Other nonspecific abnormal finding of lung field: Secondary | ICD-10-CM | POA: Insufficient documentation

## 2018-05-23 ENCOUNTER — Telehealth: Payer: Self-pay | Admitting: *Deleted

## 2018-05-23 DIAGNOSIS — R911 Solitary pulmonary nodule: Secondary | ICD-10-CM

## 2018-05-23 NOTE — Telephone Encounter (Signed)
After review of CT scan and discussion with Dr. Baruch Gouty and review of prior case conference recommendations, patient notified of increasing size of lung nodules and recommendation for referral to radiation oncology for SBRT treatment. Patient is in agreement with plan and will expect appt in near future.

## 2018-05-23 NOTE — Telephone Encounter (Signed)
Thanks for the update Shawn, I appreciate it!

## 2018-05-28 ENCOUNTER — Other Ambulatory Visit: Payer: Self-pay

## 2018-05-29 ENCOUNTER — Other Ambulatory Visit: Payer: Self-pay

## 2018-05-29 ENCOUNTER — Ambulatory Visit
Admission: RE | Admit: 2018-05-29 | Discharge: 2018-05-29 | Disposition: A | Discharge status: Home / Self Care | Payer: Medicare Other | Source: Ambulatory Visit | Attending: Radiation Oncology | Admitting: Radiation Oncology

## 2018-05-29 VITALS — BP 135/84 | HR 80 | Temp 98.1°F | Resp 18 | Wt 172.8 lb

## 2018-05-29 DIAGNOSIS — R918 Other nonspecific abnormal finding of lung field: Secondary | ICD-10-CM | POA: Insufficient documentation

## 2018-05-29 DIAGNOSIS — Z7901 Long term (current) use of anticoagulants: Secondary | ICD-10-CM | POA: Insufficient documentation

## 2018-05-29 DIAGNOSIS — I951 Orthostatic hypotension: Secondary | ICD-10-CM | POA: Diagnosis not present

## 2018-05-29 DIAGNOSIS — E785 Hyperlipidemia, unspecified: Secondary | ICD-10-CM | POA: Insufficient documentation

## 2018-05-29 DIAGNOSIS — M129 Arthropathy, unspecified: Secondary | ICD-10-CM | POA: Diagnosis not present

## 2018-05-29 DIAGNOSIS — Z79899 Other long term (current) drug therapy: Secondary | ICD-10-CM | POA: Insufficient documentation

## 2018-05-29 DIAGNOSIS — J449 Chronic obstructive pulmonary disease, unspecified: Secondary | ICD-10-CM | POA: Diagnosis not present

## 2018-05-29 DIAGNOSIS — I480 Paroxysmal atrial fibrillation: Secondary | ICD-10-CM | POA: Diagnosis not present

## 2018-05-29 DIAGNOSIS — Z87891 Personal history of nicotine dependence: Secondary | ICD-10-CM | POA: Insufficient documentation

## 2018-05-29 NOTE — Consult Note (Signed)
NEW PATIENT EVALUATION  Name: Darryl White  MRN: [id number]  Date:   05/29/2018     DOB: 09/16/1940   This 78 y.o. 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  REFERRING PHYSICIAN: Pleas Koch, NP  CHIEF COMPLAINT:  Chief Complaint  Patient presents with  . Lung Cancer    Initial Eval    DIAGNOSIS: The encounter diagnosis was Lung mass.   PREVIOUS INVESTIGATIONS:  CT scans reviewed Clinical notes reviewed Case presented at weekly tumor conference  HPI: Patient is a pleasant 78 year old male who is been noted for approximately 6 months to have nodules in his right lung concerning for progressive malignancy.  In January 2020 he was presented at lung conference and active surveillance was recommended.  He had a repeat CT scan in 42-month interval showing at this time a 1.5 cm matted nodule in the right lower lobe mildly creased in size which had low level hypermetabolic activity on a PET scan in February 2019.  Primary bronchogenic carcinoma with a diagnosis of exclusion.  He also had a 1 cm peripheral right upper lobe pulmonary nodule which is also increased in size with PET CT concerning for again synchronous primary bronchogenic carcinoma.  He had no evidence to suggest thoracic adenopathy.  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  He is seen today for consideration of SBRT treatment  PLANNED TREATMENT REGIMEN: SBRT to the right lower lobe lesion  PAST MEDICAL HISTORY:  has a past medical history of Arthritis (2019), Cataract (2019), Chicken pox, Complete heart block ([place]), Diverticulitis, Glaucoma (2015), History of stomach ulcers, Hyperlipidemia, Lymphocytic colitis, Orthostatic hypotension, Pancreatitis, and Paroxysmal atrial fibrillation ([place]).    PAST SURGICAL HISTORY:  Past Surgical History:  Procedure Laterality Date  .  APPENDECTOMY    . BACK SURGERY    . large intestine block    . PACEMAKER INSERTION     MDT dual chamber pacemaker  . TONSILLECTOMY AND ADENOIDECTOMY      FAMILY HISTORY: family history includes Cancer in his mother; Heart disease in his father.  SOCIAL HISTORY:  reports that he quit smoking about 12 months ago. He has a 57.00 pack-year smoking history. He has never used smokeless tobacco. He reports current alcohol use of about 5.0 standard drinks of alcohol per week. He reports that he does not use drugs.  ALLERGIES: Sulfa antibiotics and Penicillins  MEDICATIONS:  Current Outpatient Medications  Medication Sig Dispense Refill  . albuterol (PROAIR HFA) 108 (90 Base) MCG/ACT inhaler Inhale 1-2 puffs into the lungs every 6 (six) hours as needed for wheezing or shortness of breath. 1 Inhaler 5  . brimonidine (ALPHAGAN) 0.15 % ophthalmic solution Place 1 drop into both eyes 2 (two) times daily.     . Cholecalciferol (VITAMIN D3) 2000 UNITS capsule Take 2,000 Units by mouth daily.    Marland Kitchen diltiazem (CARDIZEM) 30 MG tablet Take 1 tablet (30 mg total) by mouth 2 (two) times daily as needed. 60 tablet 11  . ELIQUIS 5 MG TABS tablet TAKE 1 TABLET BY MOUTH TWICE DAILY 180 tablet 1  . fenofibrate (TRICOR) 145 MG tablet TAKE 1 TABLET BY MOUTH ONCE DAILY 90 tablet 3  . FOLIC ACID PO Take 1 tablet by mouth daily.    . Glycopyrrolate-Formoterol (BEVESPI AEROSPHERE) 9-4.8 MCG/ACT AERO Inhale 2 puffs into the lungs daily. 1 Inhaler 5  .  methotrexate 2.5 MG tablet Take 12.5 mg by mouth once a week.     . traMADol (ULTRAM) 50 MG tablet Take by mouth.     No current facility-administered medications for this encounter.     ECOG PERFORMANCE STATUS:  0 - Asymptomatic  REVIEW OF SYSTEMS: Patient denies any weight loss, fatigue, weakness, fever, chills or night sweats. Patient denies any loss of vision, blurred vision. Patient denies any ringing  of the ears or hearing loss. No irregular heartbeat. Patient  denies heart murmur or history of fainting. Patient denies any chest pain or pain radiating to her upper extremities. Patient denies any shortness of breath, difficulty breathing at night, cough or hemoptysis. Patient denies any swelling in the lower legs. Patient denies any nausea vomiting, vomiting of blood, or coffee ground material in the vomitus. Patient denies any stomach pain. Patient states has had normal bowel movements no significant constipation or diarrhea. Patient denies any dysuria, hematuria or significant nocturia. Patient denies any problems walking, swelling in the joints or loss of balance. Patient denies any skin changes, loss of hair or loss of weight. Patient denies any excessive worrying or anxiety or significant depression. Patient denies any problems with insomnia. Patient denies excessive thirst, polyuria, polydipsia. Patient denies any swollen glands, patient denies easy bruising or easy bleeding. Patient denies any recent infections, allergies or URI. Patient "s visual fields have not changed significantly in recent time.   PHYSICAL EXAM: BP 135/84   Pulse 80   Temp 98.1 F (36.7 C)   Resp 18   Wt 172 lb 13.5 oz (78.4 kg)   BMI 27.90 kg/m  Well-developed well-nourished patient in NAD. HEENT reveals PERLA, EOMI, discs not visualized.  Oral cavity is clear. No oral mucosal lesions are identified. Neck is clear without evidence of cervical or supraclavicular adenopathy. Lungs are clear to A&P. Cardiac examination is essentially unremarkable with regular rate and rhythm without murmur rub or thrill. Abdomen is benign with no organomegaly or masses noted. Motor sensory and DTR levels are equal and symmetric in the upper and lower extremities. Cranial nerves II through XII are grossly intact. Proprioception is intact. No peripheral adenopathy or edema is identified. No motor or sensory levels are noted. Crude visual fields are within normal range.  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  These are progressive lesions and metabolically active on PET CT scan.  It was the opinion of multiple physicians these are synchronous primary lung cancers.  I would offer SBRT 6000 cGy in 5 fractions starting with the larger right lower lobe lesion.  Risks and benefits of treatment including possible development of cough fatigue skin reaction all were discussed in detail with the patient and his entire family by phone conversation.  They all seem to comprehend my treatment plan well.  I personally set up and ordered CT simulation with motion restriction and 4-dimensional treatment planning for early next week.  I would like to take this opportunity to thank you for allowing me to participate in the care of your patient.Noreene Filbert, MD

## 2018-06-04 ENCOUNTER — Other Ambulatory Visit: Payer: Self-pay

## 2018-06-04 ENCOUNTER — Ambulatory Visit (INDEPENDENT_AMBULATORY_CARE_PROVIDER_SITE_OTHER): Payer: Medicare Other | Admitting: *Deleted

## 2018-06-04 DIAGNOSIS — I442 Atrioventricular block, complete: Secondary | ICD-10-CM

## 2018-06-04 LAB — CUP PACEART REMOTE DEVICE CHECK
Battery Impedance: 2830 Ohm
Battery Remaining Longevity: 21 mo
Battery Voltage: 2.7 V
Brady Statistic AP VP Percent: 0 %
Brady Statistic AP VS Percent: 79 %
Brady Statistic AS VP Percent: 0 %
Brady Statistic AS VS Percent: 20 %
Date Time Interrogation Session: 20200414121616
Implantable Lead Implant Date: 19941110
Implantable Lead Implant Date: 19941110
Implantable Lead Location: 753859
Implantable Lead Location: 753860
Implantable Lead Model: 5034
Implantable Lead Model: 5534
Implantable Pulse Generator Implant Date: 20081023
Lead Channel Impedance Value: 1109 Ohm
Lead Channel Impedance Value: 1286 Ohm
Lead Channel Pacing Threshold Amplitude: 0.625 V
Lead Channel Pacing Threshold Pulse Width: 0.4 ms
Lead Channel Setting Pacing Amplitude: 2 V
Lead Channel Setting Pacing Amplitude: 2 V
Lead Channel Setting Pacing Pulse Width: 0.4 ms
Lead Channel Setting Sensing Sensitivity: 5.6 mV

## 2018-06-05 ENCOUNTER — Other Ambulatory Visit: Payer: Self-pay

## 2018-06-05 ENCOUNTER — Ambulatory Visit
Admission: RE | Admit: 2018-06-05 | Discharge: 2018-06-05 | Disposition: A | Discharge status: Home / Self Care | Payer: Medicare Other | Source: Ambulatory Visit | Attending: Radiation Oncology | Admitting: Radiation Oncology

## 2018-06-05 DIAGNOSIS — Z87891 Personal history of nicotine dependence: Secondary | ICD-10-CM | POA: Diagnosis not present

## 2018-06-05 DIAGNOSIS — C3431 Malignant neoplasm of lower lobe, right bronchus or lung: Secondary | ICD-10-CM | POA: Insufficient documentation

## 2018-06-05 DIAGNOSIS — I480 Paroxysmal atrial fibrillation: Secondary | ICD-10-CM | POA: Insufficient documentation

## 2018-06-05 DIAGNOSIS — Z79899 Other long term (current) drug therapy: Secondary | ICD-10-CM | POA: Diagnosis not present

## 2018-06-05 DIAGNOSIS — C3411 Malignant neoplasm of upper lobe, right bronchus or lung: Secondary | ICD-10-CM | POA: Insufficient documentation

## 2018-06-05 DIAGNOSIS — J449 Chronic obstructive pulmonary disease, unspecified: Secondary | ICD-10-CM | POA: Diagnosis not present

## 2018-06-05 DIAGNOSIS — Z7901 Long term (current) use of anticoagulants: Secondary | ICD-10-CM | POA: Insufficient documentation

## 2018-06-05 DIAGNOSIS — Z950 Presence of cardiac pacemaker: Secondary | ICD-10-CM | POA: Diagnosis not present

## 2018-06-05 DIAGNOSIS — Z510 Encounter for antineoplastic radiation therapy: Secondary | ICD-10-CM | POA: Insufficient documentation

## 2018-06-06 DIAGNOSIS — Z510 Encounter for antineoplastic radiation therapy: Secondary | ICD-10-CM | POA: Diagnosis not present

## 2018-06-11 ENCOUNTER — Encounter: Payer: Self-pay | Admitting: Cardiology

## 2018-06-11 NOTE — Progress Notes (Signed)
Remote pacemaker transmission.   

## 2018-06-17 ENCOUNTER — Telehealth: Payer: Self-pay | Admitting: *Deleted

## 2018-06-17 NOTE — Telephone Encounter (Signed)
Spoke with patient. Advised that [name] recommended a remote PPM check after his right lung radiation is completed. Pt verbalizes understanding, plans to transmit on 07/03/18 (therapy complete on 07/02/18). He denies questions at this time and thanked me for my call.  Faxed pacemaker clearance form to [facility], confirmation received.

## 2018-06-18 ENCOUNTER — Ambulatory Visit
Admission: RE | Admit: 2018-06-18 | Discharge: 2018-06-18 | Disposition: A | Discharge status: Home / Self Care | Payer: Medicare Other | Source: Ambulatory Visit | Attending: Radiation Oncology | Admitting: Radiation Oncology

## 2018-06-18 ENCOUNTER — Inpatient Hospital Stay: Payer: Medicare Other | Attending: Radiation Oncology

## 2018-06-18 ENCOUNTER — Other Ambulatory Visit: Payer: Self-pay | Admitting: *Deleted

## 2018-06-18 ENCOUNTER — Other Ambulatory Visit: Payer: Self-pay

## 2018-06-18 DIAGNOSIS — R918 Other nonspecific abnormal finding of lung field: Secondary | ICD-10-CM

## 2018-06-18 DIAGNOSIS — Z510 Encounter for antineoplastic radiation therapy: Secondary | ICD-10-CM | POA: Diagnosis not present

## 2018-06-18 LAB — COMPREHENSIVE METABOLIC PANEL
ALT: 47 U/L — ABNORMAL HIGH (ref 0–44)
AST: 65 U/L — ABNORMAL HIGH (ref 15–41)
Albumin: 4.2 g/dL (ref 3.5–5.0)
Alkaline Phosphatase: 49 U/L (ref 38–126)
Anion gap: 7 (ref 5–15)
BUN: 22 mg/dL (ref 8–23)
CO2: 27 mmol/L (ref 22–32)
Calcium: 9.5 mg/dL (ref 8.9–10.3)
Chloride: 103 mmol/L (ref 98–111)
Creatinine, Ser: 1.32 mg/dL — ABNORMAL HIGH (ref 0.61–1.24)
GFR calc Af Amer: 60 mL/min — ABNORMAL LOW (ref >60)
GFR calc non Af Amer: 52 mL/min — ABNORMAL LOW (ref >60)
Glucose, Bld: 109 mg/dL — ABNORMAL HIGH (ref 70–99)
Potassium: 4.8 mmol/L (ref 3.5–5.1)
Sodium: 137 mmol/L (ref 135–145)
Total Bilirubin: 0.8 mg/dL (ref 0.3–1.2)
Total Protein: 7 g/dL (ref 6.5–8.1)

## 2018-06-18 LAB — CBC WITH DIFFERENTIAL/PLATELET
Abs Immature Granulocytes: 0.02 10*3/uL (ref 0.00–0.07)
Basophils Absolute: 0.1 10*3/uL (ref 0.0–0.1)
Basophils Relative: 1 %
Eosinophils Absolute: 0.1 10*3/uL (ref 0.0–0.5)
Eosinophils Relative: 2 %
HCT: 40.9 % (ref 39.0–52.0)
Hemoglobin: 13.7 g/dL (ref 13.0–17.0)
Immature Granulocytes: 1 %
Lymphocytes Relative: 25 %
Lymphs Abs: 1.1 10*3/uL (ref 0.7–4.0)
MCH: 34.5 pg — ABNORMAL HIGH (ref 26.0–34.0)
MCHC: 33.5 g/dL (ref 30.0–36.0)
MCV: 103 fL — ABNORMAL HIGH (ref 80.0–100.0)
Monocytes Absolute: 0.4 10*3/uL (ref 0.1–1.0)
Monocytes Relative: 10 %
Neutro Abs: 2.7 10*3/uL (ref 1.7–7.7)
Neutrophils Relative %: 61 %
Platelets: 167 10*3/uL (ref 150–400)
RBC: 3.97 MIL/uL — ABNORMAL LOW (ref 4.22–5.81)
RDW: 14.2 % (ref 11.5–15.5)
WBC: 4.4 10*3/uL (ref 4.0–10.5)
nRBC: 0 % (ref 0.0–0.2)

## 2018-06-20 ENCOUNTER — Ambulatory Visit
Admission: RE | Admit: 2018-06-20 | Discharge: 2018-06-20 | Disposition: A | Discharge status: Home / Self Care | Payer: Medicare Other | Source: Ambulatory Visit | Attending: Radiation Oncology | Admitting: Radiation Oncology

## 2018-06-20 ENCOUNTER — Other Ambulatory Visit: Payer: Self-pay

## 2018-06-20 DIAGNOSIS — Z510 Encounter for antineoplastic radiation therapy: Secondary | ICD-10-CM | POA: Diagnosis not present

## 2018-06-25 ENCOUNTER — Other Ambulatory Visit: Payer: Self-pay

## 2018-06-25 ENCOUNTER — Ambulatory Visit
Admission: RE | Admit: 2018-06-25 | Discharge: 2018-06-25 | Disposition: A | Discharge status: Home / Self Care | Payer: Medicare Other | Source: Ambulatory Visit | Attending: Radiation Oncology | Admitting: Radiation Oncology

## 2018-06-25 DIAGNOSIS — I480 Paroxysmal atrial fibrillation: Secondary | ICD-10-CM | POA: Diagnosis not present

## 2018-06-25 DIAGNOSIS — Z79899 Other long term (current) drug therapy: Secondary | ICD-10-CM | POA: Insufficient documentation

## 2018-06-25 DIAGNOSIS — J449 Chronic obstructive pulmonary disease, unspecified: Secondary | ICD-10-CM | POA: Diagnosis not present

## 2018-06-25 DIAGNOSIS — C3411 Malignant neoplasm of upper lobe, right bronchus or lung: Secondary | ICD-10-CM | POA: Diagnosis not present

## 2018-06-25 DIAGNOSIS — Z7901 Long term (current) use of anticoagulants: Secondary | ICD-10-CM | POA: Insufficient documentation

## 2018-06-25 DIAGNOSIS — Z87891 Personal history of nicotine dependence: Secondary | ICD-10-CM | POA: Insufficient documentation

## 2018-06-25 DIAGNOSIS — Z510 Encounter for antineoplastic radiation therapy: Secondary | ICD-10-CM | POA: Diagnosis present

## 2018-06-25 DIAGNOSIS — C3431 Malignant neoplasm of lower lobe, right bronchus or lung: Secondary | ICD-10-CM | POA: Insufficient documentation

## 2018-06-25 DIAGNOSIS — Z950 Presence of cardiac pacemaker: Secondary | ICD-10-CM | POA: Insufficient documentation

## 2018-06-27 ENCOUNTER — Other Ambulatory Visit: Payer: Self-pay

## 2018-06-27 ENCOUNTER — Ambulatory Visit
Admission: RE | Admit: 2018-06-27 | Discharge: 2018-06-27 | Disposition: A | Discharge status: Home / Self Care | Payer: Medicare Other | Source: Ambulatory Visit | Attending: Radiation Oncology | Admitting: Radiation Oncology

## 2018-06-27 DIAGNOSIS — Z510 Encounter for antineoplastic radiation therapy: Secondary | ICD-10-CM | POA: Diagnosis not present

## 2018-07-02 ENCOUNTER — Ambulatory Visit
Admission: RE | Admit: 2018-07-02 | Discharge: 2018-07-02 | Disposition: A | Discharge status: Home / Self Care | Payer: Medicare Other | Source: Ambulatory Visit | Attending: Radiation Oncology | Admitting: Radiation Oncology

## 2018-07-02 ENCOUNTER — Other Ambulatory Visit: Payer: Self-pay

## 2018-07-02 DIAGNOSIS — Z510 Encounter for antineoplastic radiation therapy: Secondary | ICD-10-CM | POA: Diagnosis not present

## 2018-07-03 ENCOUNTER — Ambulatory Visit (INDEPENDENT_AMBULATORY_CARE_PROVIDER_SITE_OTHER): Payer: Medicare Other | Admitting: *Deleted

## 2018-07-03 DIAGNOSIS — I442 Atrioventricular block, complete: Secondary | ICD-10-CM

## 2018-07-03 DIAGNOSIS — R55 Syncope and collapse: Secondary | ICD-10-CM

## 2018-07-03 LAB — CUP PACEART REMOTE DEVICE CHECK
Battery Impedance: 3470 Ohm
Battery Remaining Longevity: 16 mo
Battery Voltage: 2.69 V
Brady Statistic AP VP Percent: 0 %
Brady Statistic AP VS Percent: 80 %
Brady Statistic AS VP Percent: 0 %
Brady Statistic AS VS Percent: 20 %
Date Time Interrogation Session: 20200513114344
Implantable Lead Implant Date: 19941110
Implantable Lead Implant Date: 19941110
Implantable Lead Location: 753859
Implantable Lead Location: 753860
Implantable Lead Model: 5034
Implantable Lead Model: 5534
Implantable Pulse Generator Implant Date: 20081023
Lead Channel Impedance Value: 1059 Ohm
Lead Channel Impedance Value: 1198 Ohm
Lead Channel Pacing Threshold Amplitude: 0.375 V
Lead Channel Pacing Threshold Pulse Width: 0.4 ms
Lead Channel Setting Pacing Amplitude: 2 V
Lead Channel Setting Pacing Amplitude: 2 V
Lead Channel Setting Pacing Pulse Width: 0.4 ms
Lead Channel Setting Sensing Sensitivity: 5.6 mV

## 2018-07-23 NOTE — Progress Notes (Signed)
Remote pacemaker transmission.   

## 2018-07-23 NOTE — Addendum Note (Signed)
Addended by: Douglass Rivers D on: 07/23/2018 03:12 PM   Modules accepted: Level of Service

## 2018-08-02 ENCOUNTER — Other Ambulatory Visit: Payer: Self-pay

## 2018-08-05 ENCOUNTER — Ambulatory Visit
Admission: RE | Admit: 2018-08-05 | Discharge: 2018-08-05 | Disposition: A | Discharge status: Home / Self Care | Payer: Medicare Other | Source: Ambulatory Visit | Attending: Radiation Oncology | Admitting: Radiation Oncology

## 2018-08-05 ENCOUNTER — Encounter: Payer: Self-pay | Admitting: Radiation Oncology

## 2018-08-05 ENCOUNTER — Other Ambulatory Visit: Payer: Self-pay | Admitting: *Deleted

## 2018-08-05 ENCOUNTER — Other Ambulatory Visit: Payer: Self-pay

## 2018-08-05 VITALS — BP 143/89 | HR 89 | Resp 18 | Wt 170.1 lb

## 2018-08-05 DIAGNOSIS — R918 Other nonspecific abnormal finding of lung field: Secondary | ICD-10-CM

## 2018-08-05 DIAGNOSIS — C3431 Malignant neoplasm of lower lobe, right bronchus or lung: Secondary | ICD-10-CM | POA: Diagnosis present

## 2018-08-05 DIAGNOSIS — Z923 Personal history of irradiation: Secondary | ICD-10-CM | POA: Insufficient documentation

## 2018-08-05 NOTE — Progress Notes (Signed)
Radiation Oncology Follow up Note  Name: Darryl White   Date:   08/05/2018 MRN:  [id number] DOB: [birth year]    This 78 y.o. male presents to the clinic today for 1 month follow-up status post SBRT for non-small cell lung cancer of the right lung.  REFERRING PROVIDER: Pleas Koch, NP  HPI: Patient is a.  78 year old male now 1 month out for SBRT to a right lower lobe lesion he has 2 synchronous lesions.  We treated the larger one at this time.  He is seen today in routine follow-up is doing well specifically [place] cough hemoptysis or chest tightness.  COMPLICATIONS OF TREATMENT: none  FOLLOW UP COMPLIANCE: keeps appointments   PHYSICAL EXAM:  BP (!) 143/89 (BP Location: Left Arm)   Pulse 89   Resp 18   Wt 170 lb 1.4 oz (77.2 kg)   BMI 27.45 kg/m  Well-developed well-nourished patient in NAD. HEENT reveals PERLA, EOMI, discs not visualized.  Oral cavity is clear. No oral mucosal lesions are identified. Neck is clear without evidence of cervical or supraclavicular adenopathy. Lungs are clear to A&P. Cardiac examination is essentially unremarkable with regular rate and rhythm without murmur rub or thrill. Abdomen is benign with no organomegaly or masses noted. Motor sensory and DTR levels are equal and symmetric in the upper and lower extremities. Cranial nerves II through XII are grossly intact. Proprioception is intact. No peripheral adenopathy or edema is identified. No motor or sensory levels are noted. Crude visual fields are within normal range.  RADIOLOGY RESULTS: No current films for review  PLAN: At this time patient is doing well with no significant side effects from his SBRT.  I will wait another 3 months repeat his CT scan should the other lesion in the right lower lobe be progressing would offer SBRT to that lesion also.  Patient comprehends my treatment plan well.  He knows to call with any concerns.  I would like to take this opportunity to thank you for allowing me to  participate in the care of your patient.Noreene Filbert, MD

## 2018-09-03 ENCOUNTER — Ambulatory Visit (INDEPENDENT_AMBULATORY_CARE_PROVIDER_SITE_OTHER): Payer: Medicare Other | Admitting: *Deleted

## 2018-09-03 DIAGNOSIS — I480 Paroxysmal atrial fibrillation: Secondary | ICD-10-CM | POA: Diagnosis not present

## 2018-09-03 LAB — CUP PACEART REMOTE DEVICE CHECK
Battery Impedance: 4529 Ohm
Battery Remaining Longevity: 10 mo
Battery Voltage: 2.64 V
Brady Statistic AP VP Percent: 0 %
Brady Statistic AP VS Percent: 80 %
Brady Statistic AS VP Percent: 0 %
Brady Statistic AS VS Percent: 20 %
Date Time Interrogation Session: 20200714113208
Implantable Lead Implant Date: 19941110
Implantable Lead Implant Date: 19941110
Implantable Lead Location: 753859
Implantable Lead Location: 753860
Implantable Lead Model: 5034
Implantable Lead Model: 5534
Implantable Pulse Generator Implant Date: 20081023
Lead Channel Impedance Value: 1071 Ohm
Lead Channel Impedance Value: 1172 Ohm
Lead Channel Pacing Threshold Amplitude: 0.625 V
Lead Channel Pacing Threshold Pulse Width: 0.4 ms
Lead Channel Setting Pacing Amplitude: 2 V
Lead Channel Setting Pacing Amplitude: 2 V
Lead Channel Setting Pacing Pulse Width: 0.4 ms
Lead Channel Setting Sensing Sensitivity: 5.6 mV

## 2018-09-06 ENCOUNTER — Encounter: Payer: Self-pay | Admitting: Radiation Oncology

## 2018-09-06 DIAGNOSIS — Z23 Encounter for immunization: Secondary | ICD-10-CM

## 2018-09-06 MED ORDER — ZOSTER VAC RECOMB ADJUVANTED 50 MCG/0.5ML IM SUSR: 0.5000 mL | Freq: Once | INTRAMUSCULAR | 1 refills | Status: AC

## 2018-09-19 NOTE — Progress Notes (Signed)
Remote pacemaker transmission.   

## 2018-09-28 ENCOUNTER — Other Ambulatory Visit: Payer: Self-pay | Admitting: Internal Medicine

## 2018-09-28 DIAGNOSIS — I480 Paroxysmal atrial fibrillation: Secondary | ICD-10-CM

## 2018-09-30 NOTE — Telephone Encounter (Signed)
Lovw/klein7/23/19

## 2018-09-30 NOTE — Telephone Encounter (Signed)
80m 81kg 1.32 06/18/18 lovw/klein

## 2018-10-01 NOTE — Progress Notes (Signed)
[facility] Consultation      Assessment and Plan:  Pulmonary fibrosis. -Reviewed images with patient, patient has changes of usual interstitial pneumonia which appear consistent with a diagnosis of pulmonary fibrosis.  I suspect that this is related to his underlying rheumatoid arthritis, doubtful that methotrexate would cause this. -Given his relative lack of symptoms, and the stability of his fibrotic changes, would not initiate medications for pulmonary fibrosis at this time, we will continue to monitor.  Emphysema. - Advanced apical emphysematous changes.  Now has symptoms of excess mucus production.  Continue albuterol inhaler, will add Spiriva inhaler once daily. -60+ pack years. -Continue CT lung cancer screening.  Nicotine abuse.  - He has quit smoking on 04/29/17.  Continue smoking cessation.  Lung nodules. - Patient has undergone SBRT to the larger of 2 lung nodules.  These are being followed by radiation oncology.  Due to advanced disease the patient is undergoing radiation therapy empirically as biopsies would be high risk given the patient's advanced lung disease.  Meds ordered this encounter  Medications  . tiotropium (SPIRIVA HANDIHALER) 18 MCG inhalation capsule    Sig: Place 1 capsule (18 mcg total) into inhaler and inhale daily.    Dispense:  30 capsule    Refill:  2   Return in about 6 months (around 04/04/2019).   Date: 10/01/2018  MRN# [id number] Darryl White Jun 01, 1940  Referring Physician: Cancer center.   Darryl White is a 78 y.o. old male seen in consultation for chief complaint of:    Chief Complaint  Patient presents with  . COPD    Pt states breathing is stable.pt c/o non productive cough/ pt denies wheezing and chest tightness.  . Shortness of Breath    walks a mile each morning and uses rescue inhaler.    HPI:   Darryl White is a 78 y.o. male   with a history of atrial fibrillation on anticoagulation, rheumatoid  arthritis. He went for low dose Ct chest for screening, results showed emphysema and usual interstitial pneumonia.  He appeared to be minimally symptomatic it was thought that his changes are likely related to underlying rheumatoid arthritis.  He was encouraged to continue smoking cessation he was started on a rescue inhaler to be used as needed.  Since his last visit he feels that he is doing well. He has been walking for 1 mile every morning, and he stops half way and takes a puff of albuterol. He underwent SBRT of the right lung, he has 2 lesions in the right lung found on CT lung cancer screening and underwent SBRT on the larger nodule.  He take no inhalers other than albuterol. He has noticed that he is having excess expectoration at times.   He denies nasal drainage.  Continues to have reflux which is controlled with medications. Patient has managed to continue smoking cessation.  He has RA, he is on MTX 12.5 mg weekly, and this has his symptoms completely controlled with this.  He has a history of syncope, which was controlled with a pacemaker 20 yrs ago.   **PET scan 01/22/2018>> images personally viewed, severe apical emphysematous changes, with advanced basilar interstitial disease. **PFT 09/25/2017>> tracings personally reviewed.  FVC is 190% predicted, FEV1 is 98% predicted.  Ratio 64% there is no significant improvement with bronchodilator therapy.  Flow volume loop appears obstructed. TLC is 107% protected, ratio is normal.  DLCO severely reduced at 36%. -Overall this test shows mild emphysema with  severely reduced diffusion capacity. **low-dose CT chest 2/8/1>>; bilateral severe emphysematous changes, bibasilar fibrotic changes in a typical UIP pattern, appears relatively unchanged from previous scan in 03/14/16.  Medication:    Current Outpatient Medications:  .  albuterol (PROAIR HFA) 108 (90 Base) MCG/ACT inhaler, Inhale 1-2 puffs into the lungs every 6 (six) hours as needed for  wheezing or shortness of breath., Disp: 1 Inhaler, Rfl: 5 .  brimonidine (ALPHAGAN) 0.15 % ophthalmic solution, Place 1 drop into both eyes 2 (two) times daily. , Disp: , Rfl:  .  Cholecalciferol (VITAMIN D3) 2000 UNITS capsule, Take 2,000 Units by mouth daily., Disp: , Rfl:  .  diltiazem (CARDIZEM) 30 MG tablet, Take 1 tablet (30 mg total) by mouth 2 (two) times daily as needed. (Patient not taking: Reported on 08/05/2018), Disp: 60 tablet, Rfl: 11 .  ELIQUIS 5 MG TABS tablet, TAKE 1 TABLET BY MOUTH TWICE DAILY, Disp: 60 tablet, Rfl: 0 .  fenofibrate (TRICOR) 145 MG tablet, TAKE 1 TABLET BY MOUTH ONCE DAILY, Disp: 90 tablet, Rfl: 3 .  FOLIC ACID PO, Take 1 tablet by mouth daily., Disp: , Rfl:  .  gabapentin (NEURONTIN) 100 MG capsule, Take 100 mg by mouth 3 (three) times daily., Disp: , Rfl:  .  Glycopyrrolate-Formoterol (BEVESPI AEROSPHERE) 9-4.8 MCG/ACT AERO, Inhale 2 puffs into the lungs daily., Disp: 1 Inhaler, Rfl: 5 .  methotrexate 2.5 MG tablet, Take 12.5 mg by mouth once a week. , Disp: , Rfl:  .  traMADol (ULTRAM) 50 MG tablet, Take by mouth every 12 (twelve) hours as needed. , Disp: , Rfl:    Allergies:  Sulfa antibiotics and Penicillins  Review of Systems:  Constitutional: Feels well. Cardiovascular: Denies chest pain, exertional chest pain.  Pulmonary: Denies hemoptysis, pleuritic chest pain.   The remainder of systems were reviewed and were found to be negative other than what is documented in the HPI.    Physical Examination:   VS: BP 110/68 (BP Location: Left Arm, Cuff Size: Normal)   Pulse 67   Resp 16   Ht 5\' 6"  (1.676 m)   Wt 169 lb (76.7 kg)   SpO2 93%   BMI 27.28 kg/m   General Appearance: No distress  Neuro:without focal findings, mental status, speech normal, alert and oriented HEENT: PERRLA, EOM intact Pulmonary: No wheezing, No rales  CardiovascularNormal S1,S2.  No m/r/g.  Abdomen: Benign, Soft, non-tender, No masses Renal:  No costovertebral tenderness   GU:  No performed at this time. Endoc: No evident thyromegaly, no signs of acromegaly or Cushing features Skin:   warm, no rashes, no ecchymosis  Extremities: normal, no cyanosis, clubbing.      LABORATORY PANEL:   CBC No results for input(s): WBC, HGB, HCT, PLT in the last 168 hours. ------------------------------------------------------------------------------------------------------------------  Chemistries  No results for input(s): NA, K, CL, CO2, GLUCOSE, BUN, CREATININE, CALCIUM, MG, AST, ALT, ALKPHOS, BILITOT in the last 168 hours.  Invalid input(s): GFRCGP ------------------------------------------------------------------------------------------------------------------  Cardiac Enzymes No results for input(s): TROPONINI in the last 168 hours. ------------------------------------------------------------  RADIOLOGY:  No results found.     Thank  you for the consultation and for allowing [facility] to assist in the care of your patient. Our recommendations are noted above.  Please contact us if we can be of further service.   Marda Stalker, M.D., F.C.C.P.  Board Certified in Internal Medicine, Pulmonary Medicine, [place], and Sleep Medicine.  [facility] Office Number: [phone]  10/01/2018 

## 2018-10-02 ENCOUNTER — Ambulatory Visit: Payer: Medicare Other | Admitting: Internal Medicine

## 2018-10-02 ENCOUNTER — Other Ambulatory Visit: Payer: Self-pay

## 2018-10-02 VITALS — BP 110/68 | HR 67 | Resp 16 | Ht 66.0 in | Wt 169.0 lb

## 2018-10-02 DIAGNOSIS — R918 Other nonspecific abnormal finding of lung field: Secondary | ICD-10-CM

## 2018-10-02 DIAGNOSIS — J449 Chronic obstructive pulmonary disease, unspecified: Secondary | ICD-10-CM | POA: Diagnosis not present

## 2018-10-02 MED ORDER — SPIRIVA HANDIHALER 18 MCG IN CAPS: 18.0000 ug | ORAL_CAPSULE | Freq: Every day | RESPIRATORY_TRACT | 2 refills | Status: DC

## 2018-10-02 NOTE — Patient Instructions (Signed)
Will start spiriva inhaler once daily.  Use albuterol as needed.

## 2018-10-22 ENCOUNTER — Ambulatory Visit (INDEPENDENT_AMBULATORY_CARE_PROVIDER_SITE_OTHER): Payer: Medicare Other | Admitting: Internal Medicine

## 2018-10-22 ENCOUNTER — Encounter: Payer: Self-pay | Admitting: Internal Medicine

## 2018-10-22 ENCOUNTER — Other Ambulatory Visit: Payer: Self-pay

## 2018-10-22 VITALS — BP 132/80 | HR 82 | Ht 66.0 in | Wt 172.0 lb

## 2018-10-22 DIAGNOSIS — I480 Paroxysmal atrial fibrillation: Secondary | ICD-10-CM | POA: Diagnosis not present

## 2018-10-22 DIAGNOSIS — R55 Syncope and collapse: Secondary | ICD-10-CM | POA: Diagnosis not present

## 2018-10-22 DIAGNOSIS — I442 Atrioventricular block, complete: Secondary | ICD-10-CM | POA: Diagnosis not present

## 2018-10-22 DIAGNOSIS — Z950 Presence of cardiac pacemaker: Secondary | ICD-10-CM | POA: Diagnosis not present

## 2018-10-22 DIAGNOSIS — R0600 Dyspnea, unspecified: Secondary | ICD-10-CM

## 2018-10-22 NOTE — Patient Instructions (Addendum)
Medication Instructions:  - Your physician recommends that you continue on your current medications as directed. Please refer to the Current Medication list given to you today.  If you need a refill on your cardiac medications before your next appointment, please call your pharmacy.   Lab work: - none ordered  If you have labs (blood work) drawn today and your tests are completely normal, you will receive your results only by: Marland Kitchen MyChart Message (if you have MyChart) OR . A paper copy in the mail If you have any lab test that is abnormal or we need to change your treatment, we will call you to review the results.  Testing/Procedures: - Your physician has requested that you have an echocardiogram. Echocardiography is a painless test that uses sound waves to create images of your heart. It provides your doctor with information about the size and shape of your heart and how well your heart's chambers and valves are working. This procedure takes approximately one hour. There are no restrictions for this procedure.   Follow-Up: At [facility], you and your health needs are our priority.  As part of our continuing mission to provide you with exceptional heart care, we have created designated Provider Care Teams.  These Care Teams include your primary Cardiologist (physician) and Advanced Practice Providers (APPs -  Physician Assistants and Nurse Practitioners) who all work together to provide you with the care you need, when you need it. . pending with [name]- monthly battery checks  Any Other Special Instructions Will Be Listed Below (If Applicable). - N/A

## 2018-10-22 NOTE — Progress Notes (Signed)
Patient Care Team: Pleas Koch, NP as PCP - General (Internal Medicine) Deboraha Sprang, MD as Consulting Physician (Cardiology) Marlowe Sax, MD as Referring Physician (Internal Medicine) Birder Robson, MD as Referring Physician (Ophthalmology) Oneta Rack, MD as Referring Physician (Dermatology) Salli Real, DDS as Referring Physician (Dentistry)   HPI  [name] is a 78 y.o. male Seen in followup for  [facility] for  PM and OI associated with syncope  New episode AFib>> [facility] 8/16 and converted spontanously   He had atrial undersensing resulting in failure to mode switch and tracking at the upper rate limit. PM implanted 2008 for syncope. He has had no recurrent syncope, presumably neurally mediated by the family's description  The patient denies chest pain  nocturnal dyspnea, orthopnea or peripheral edema.  There have been no palpitations, lightheadedness or syncope.  Chronic and worsening SOB attributed to COPD--improved recently with introduction of spiriva   On Anticoagulation;  No bleeding issues   Date Cr Hgb  6/18 1.1 14.9  1/19  1.14 15.2  4/20 1.37 22.6   Thromboembolic risk factors ( age  -2) for a CHADSVASc Score of 2   Myoview 8/16 EF 65% No ischemia  Lung Ca s/p radiation Therapy now w another lesion noted >> repeat CT pending    Past Medical History:  Diagnosis Date  . Arthritis 2019   dx of osteoarthritis and rhemautoid arthritis  . Cataract 2019   left eye  . Chicken pox   . 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 Diverticulitis   . 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 History of stomach ulcers   . Hyperlipidemia   . Lymphocytic colitis   . Orthostatic hypotension   . Pancreatitis   . Paroxysmal atrial fibrillation [facility])     Past Surgical History:  Procedure Laterality Date  . APPENDECTOMY    . BACK SURGERY    . large intestine block    . PACEMAKER INSERTION     MDT dual chamber  pacemaker  . TONSILLECTOMY AND ADENOIDECTOMY      Current Outpatient Medications  Medication Sig Dispense Refill  . albuterol (PROAIR HFA) 108 (90 Base) MCG/ACT inhaler Inhale 1-2 puffs into the lungs every 6 (six) hours as needed for wheezing or shortness of breath. 1 Inhaler 5  . brimonidine (ALPHAGAN) 0.15 % ophthalmic solution Place 1 drop into both eyes 2 (two) times daily.     . Cholecalciferol (VITAMIN D3) 2000 UNITS capsule Take 2,000 Units by mouth daily.    Marland Kitchen dextromethorphan-guaiFENesin (MUCINEX DM) 30-600 MG 12hr tablet Take 1 tablet by mouth 2 (two) times daily.    Marland Kitchen diltiazem (CARDIZEM) 30 MG tablet Take 1 tablet (30 mg total) by mouth 2 (two) times daily as needed. 60 tablet 11  . ELIQUIS 5 MG TABS tablet TAKE 1 TABLET BY MOUTH TWICE DAILY 60 tablet 0  . fenofibrate (TRICOR) 145 MG tablet TAKE 1 TABLET BY MOUTH ONCE DAILY 90 tablet 3  . FOLIC ACID PO Take 1 tablet by mouth daily.    Marland Kitchen gabapentin (NEURONTIN) 100 MG capsule Take 100 mg by mouth 3 (three) times daily.    . methotrexate 2.5 MG tablet Take 12.5 mg by mouth once a week.     . tiotropium (SPIRIVA HANDIHALER) 18 MCG inhalation capsule Place 1 capsule (18 mcg total) into inhaler and inhale daily. 30 capsule 2  . traMADol (ULTRAM) 50  MG tablet Take by mouth every 12 (twelve) hours as needed.      No current facility-administered medications for this visit.     Allergies  Allergen Reactions  . Sulfa Antibiotics Hives and Itching  . Penicillins Hives and Palpitations      Review of Systems negative except from HPI and PMH  Physical Exam BP 132/80 (BP Location: Left Arm, Patient Position: Sitting, Cuff Size: Normal)   Pulse 82   Ht 5\' 6"  (1.676 m)   Wt 172 lb (78 kg)   SpO2 96%   BMI 27.76 kg/m  Well developed and well nourished in no acute distress HENT normal Neck supple with JVP-flat Clear Device pocket well healed; without hematoma or erythema.  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  The information was reviewed. No changes were made in the programming.   Device approaching ERI.     Atrial fibrillation-paroxysmal  Sinus node dysfunction   Dyspnea on exertion     No intercurrent syncope  No intercurrent atrial fibrillation or flutter  On Anticoagulation;  No bleeding issues   Dypsnea attributed to COPD  No evidence of volume overload; no hx of LV dysfunction but with hx of HTN could well have diastolic dysfunction-- will undertake echo to look for other contributors ( PFTS described only mild COPD)   Device approaching ERI  We have reviewed the benefits and risks of generator replacement.  These include but are not limited to lead fracture and infection.  The patient understands and will plan for gen change at [facility]

## 2018-10-24 ENCOUNTER — Telehealth: Payer: Self-pay | Admitting: Internal Medicine

## 2018-10-24 DIAGNOSIS — I442 Atrioventricular block, complete: Secondary | ICD-10-CM

## 2018-10-24 DIAGNOSIS — Z950 Presence of cardiac pacemaker: Secondary | ICD-10-CM

## 2018-10-24 DIAGNOSIS — Z01812 Encounter for preprocedural laboratory examination: Secondary | ICD-10-CM

## 2018-10-24 NOTE — Telephone Encounter (Signed)
Patient calling in regarding pacemaker. Patients pacemaker has heart rate of 62-64 and was told to call once it drops down past 65. Patient would like to speak to Nurse, Please advise

## 2018-10-24 NOTE — Telephone Encounter (Signed)
Spoke w/ pt and requested that he send a manual transmission w/ his home monitor. Pt stated that he would do that.

## 2018-10-25 ENCOUNTER — Other Ambulatory Visit: Payer: Self-pay | Admitting: Internal Medicine

## 2018-10-25 DIAGNOSIS — I480 Paroxysmal atrial fibrillation: Secondary | ICD-10-CM

## 2018-10-25 NOTE — Telephone Encounter (Signed)
Patient calling in again to speak with someone about his concerns yesterday. Please advise patient once available. [phone] is the best contact  Patient has been transferred to device clinic

## 2018-10-25 NOTE — Telephone Encounter (Signed)
Eliquis 5mg  refill request received; pt is 78yrs old, weight-78kg, Crea-1.32 on 06/18/2018, Diagnosis-Afib, and last seen by [name] on 10/22/2018. Dose is appropriate based on dosing criteria. Will send in refill to requested pharmacy.

## 2018-10-25 NOTE — Telephone Encounter (Signed)
Patient notified that device at [facility]. Patient reports that his pulse is in the 60's and he has no energy. Will be scheduled with APP or DR Caryl Comes for appointment to discuss gen change. No CP and no SOB.  Reached ERI 10/24/18. Prior to ERI ppm mode DDIR ,rate 70. AT ERI mode changed by device to VVI , rate of 65.

## 2018-10-29 ENCOUNTER — Other Ambulatory Visit: Payer: Self-pay

## 2018-10-29 ENCOUNTER — Ambulatory Visit
Admission: RE | Admit: 2018-10-29 | Discharge: 2018-10-29 | Disposition: A | Discharge status: Home / Self Care | Payer: Medicare Other | Source: Ambulatory Visit | Attending: Radiation Oncology | Admitting: Radiation Oncology

## 2018-10-29 DIAGNOSIS — R918 Other nonspecific abnormal finding of lung field: Secondary | ICD-10-CM | POA: Diagnosis present

## 2018-10-29 HISTORY — DX: Systemic involvement of connective tissue, unspecified: M35.9

## 2018-10-29 MED ORDER — IOHEXOL 300 MG/ML  SOLN
75.0000 mL | Freq: Once | INTRAMUSCULAR | Status: AC | PRN
  Administered 2018-10-29: 75 mL via INTRAVENOUS

## 2018-10-29 NOTE — Telephone Encounter (Signed)
[name] discussed risks/benefits of gen change with patient at recent [place] on 10/22/18. Routed to [place], Therapist, sports, to schedule procedure.

## 2018-10-30 ENCOUNTER — Encounter: Payer: Self-pay | Admitting: *Deleted

## 2018-10-30 NOTE — Addendum Note (Signed)
Addended by: Alvis Lemmings C on: 10/30/2018 03:53 PM   Modules accepted: Orders

## 2018-10-30 NOTE — Telephone Encounter (Signed)
I called and spoke with the patient. He states he is feeling good right now. His BP was a little low earlier today, but it is back to normal.   I advised him if he did not feel well, we could look to have one of Dr. [name] partners replace his battery since [name] is out next week, otherwise, it will be 9/28 before I can get him in.  The patient again states he is feeling good. He is going to the [employer] for the week, the last week of September. He is ok with waiting until October to get his gen change done with [name].  I advised him it looks like 10/12 is the first available. He is ok with this.  He is aware I will schedule this for him and mail a copy of his instructions.   I have advised him to call back sooner if he did not feel well. The patient voices understanding.

## 2018-11-04 ENCOUNTER — Encounter: Payer: Self-pay | Admitting: Radiation Oncology

## 2018-11-04 ENCOUNTER — Other Ambulatory Visit: Payer: Self-pay | Admitting: *Deleted

## 2018-11-04 ENCOUNTER — Other Ambulatory Visit: Payer: Self-pay

## 2018-11-04 ENCOUNTER — Ambulatory Visit
Admission: RE | Admit: 2018-11-04 | Discharge: 2018-11-04 | Disposition: A | Discharge status: Home / Self Care | Payer: Medicare Other | Source: Ambulatory Visit | Attending: Radiation Oncology | Admitting: Radiation Oncology

## 2018-11-04 VITALS — BP 121/79 | HR 64 | Temp 98.6°F | Resp 16 | Wt 174.0 lb

## 2018-11-04 DIAGNOSIS — Z923 Personal history of irradiation: Secondary | ICD-10-CM | POA: Diagnosis not present

## 2018-11-04 DIAGNOSIS — R918 Other nonspecific abnormal finding of lung field: Secondary | ICD-10-CM

## 2018-11-04 DIAGNOSIS — C3431 Malignant neoplasm of lower lobe, right bronchus or lung: Secondary | ICD-10-CM | POA: Insufficient documentation

## 2018-11-04 DIAGNOSIS — J439 Emphysema, unspecified: Secondary | ICD-10-CM | POA: Diagnosis not present

## 2018-11-04 NOTE — Progress Notes (Signed)
Radiation Oncology Follow up Note  Name: Darryl White   Date:   11/04/2018 MRN:  [id number] DOB: 09-07-1940    This 78 y.o. 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  He is seen today and is doing well.  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  He had a repeat CT scan last week showing mild reduction volume the right lower lobe pulmonary nodule that we treated.  There is increased volume of the right peripheral upper lobe subpleural pulmonary nodule which previously was 1 cm and is now 1.6.  This area was mildly hypermetabolic on previous PET CT scan.  COMPLICATIONS OF TREATMENT: none  FOLLOW UP COMPLIANCE: keeps appointments   PHYSICAL EXAM:  BP 121/79 (BP Location: Left Arm, Patient Position: Sitting)   Pulse 64   Temp 98.6 F (37 C) (Tympanic)   Resp 16   Wt 174 lb (78.9 kg)   BMI 28.08 kg/m  Well-developed well-nourished patient in NAD. HEENT reveals PERLA, EOMI, discs not visualized.  Oral cavity is clear. No oral mucosal lesions are identified. Neck is clear without evidence of cervical or supraclavicular adenopathy. Lungs are clear to A&P. Cardiac examination is essentially unremarkable with regular rate and rhythm without murmur rub or thrill. Abdomen is benign with no organomegaly or masses noted. Motor sensory and DTR levels are equal and symmetric in the upper and lower extremities. Cranial nerves II through XII are grossly intact. Proprioception is intact. No peripheral adenopathy or edema is identified. No motor or sensory levels are noted. Crude visual fields are within normal  range.  RADIOLOGY RESULTS: CT scans reviewed compatible with above-stated findings  PLAN: At present time there is been minimal increased growth in his subpleural right upper lobe nodule.  This area is yet to be treated.  I am going to see him back in 3 months with a follow-up CT scan at that time.  Should the be continued growth will offer SBRT treatment to that lesion also.  I am pleased with radiologic results of his right lower lobe lesion.  I discussed this with his wife also personally.  They both comprehend my right rational and treatment planning well.  I would like to take this opportunity to thank you for allowing me to participate in the care of your patient.Noreene Filbert, MD

## 2018-11-13 ENCOUNTER — Ambulatory Visit (INDEPENDENT_AMBULATORY_CARE_PROVIDER_SITE_OTHER): Payer: Medicare Other

## 2018-11-13 ENCOUNTER — Other Ambulatory Visit: Payer: Self-pay

## 2018-11-13 DIAGNOSIS — R0600 Dyspnea, unspecified: Secondary | ICD-10-CM

## 2018-11-19 ENCOUNTER — Ambulatory Visit (INDEPENDENT_AMBULATORY_CARE_PROVIDER_SITE_OTHER): Payer: Medicare Other | Admitting: *Deleted

## 2018-11-19 DIAGNOSIS — I442 Atrioventricular block, complete: Secondary | ICD-10-CM

## 2018-11-20 LAB — CUP PACEART REMOTE DEVICE CHECK
Battery Impedance: 7134 Ohm
Battery Voltage: 2.62 V
Brady Statistic RV Percent Paced: 1 %
Date Time Interrogation Session: 20200929124933
Implantable Lead Implant Date: 19941110
Implantable Lead Implant Date: 19941110
Implantable Lead Location: 753859
Implantable Lead Location: 753860
Implantable Lead Model: 5034
Implantable Lead Model: 5534
Implantable Pulse Generator Implant Date: 20081023
Lead Channel Impedance Value: 1096 Ohm
Lead Channel Impedance Value: 67 Ohm
Lead Channel Setting Pacing Amplitude: 2 V
Lead Channel Setting Pacing Pulse Width: 0.4 ms
Lead Channel Setting Sensing Sensitivity: 4 mV

## 2018-11-27 ENCOUNTER — Other Ambulatory Visit: Payer: Self-pay | Admitting: Internal Medicine

## 2018-11-27 DIAGNOSIS — Z950 Presence of cardiac pacemaker: Secondary | ICD-10-CM

## 2018-11-27 LAB — CUP PACEART INCLINIC DEVICE CHECK
Battery Impedance: 6309 Ohm
Battery Remaining Longevity: 3 mo
Battery Voltage: 2.58 V
Brady Statistic AP VP Percent: 0 %
Brady Statistic AP VS Percent: 80 %
Brady Statistic AS VP Percent: 0 %
Brady Statistic AS VS Percent: 19 %
Date Time Interrogation Session: 20200901152540
Implantable Lead Implant Date: 19941110
Implantable Lead Implant Date: 19941110
Implantable Lead Location: 753859
Implantable Lead Location: 753860
Implantable Lead Model: 5034
Implantable Lead Model: 5534
Implantable Pulse Generator Implant Date: 20081023
Lead Channel Impedance Value: 1096 Ohm
Lead Channel Impedance Value: 1190 Ohm
Lead Channel Pacing Threshold Amplitude: 0.5 V
Lead Channel Pacing Threshold Amplitude: 0.75 V
Lead Channel Pacing Threshold Pulse Width: 0.4 ms
Lead Channel Pacing Threshold Pulse Width: 0.4 ms
Lead Channel Sensing Intrinsic Amplitude: 15.67 mV
Lead Channel Sensing Intrinsic Amplitude: 2 mV
Lead Channel Setting Pacing Amplitude: 2 V
Lead Channel Setting Pacing Amplitude: 2 V
Lead Channel Setting Pacing Pulse Width: 0.4 ms
Lead Channel Setting Sensing Sensitivity: 5.6 mV

## 2018-11-28 ENCOUNTER — Other Ambulatory Visit
Admission: RE | Admit: 2018-11-28 | Discharge: 2018-11-28 | Disposition: A | Discharge status: Home / Self Care | Payer: Medicare Other | Source: Ambulatory Visit | Attending: Internal Medicine | Admitting: Internal Medicine

## 2018-11-28 ENCOUNTER — Other Ambulatory Visit: Payer: Self-pay

## 2018-11-28 DIAGNOSIS — Z950 Presence of cardiac pacemaker: Secondary | ICD-10-CM | POA: Diagnosis not present

## 2018-11-28 DIAGNOSIS — I442 Atrioventricular block, complete: Secondary | ICD-10-CM

## 2018-11-28 DIAGNOSIS — Z01812 Encounter for preprocedural laboratory examination: Secondary | ICD-10-CM

## 2018-11-28 DIAGNOSIS — Z20828 Contact with and (suspected) exposure to other viral communicable diseases: Secondary | ICD-10-CM | POA: Diagnosis not present

## 2018-11-28 LAB — BASIC METABOLIC PANEL
Anion gap: 7 (ref 5–15)
BUN: 19 mg/dL (ref 8–23)
CO2: 26 mmol/L (ref 22–32)
Calcium: 9.4 mg/dL (ref 8.9–10.3)
Chloride: 101 mmol/L (ref 98–111)
Creatinine, Ser: 1.23 mg/dL (ref 0.61–1.24)
GFR calc Af Amer: >60 mL/min (ref >60)
GFR calc non Af Amer: 56 mL/min — ABNORMAL LOW (ref >60)
Glucose, Bld: 117 mg/dL — ABNORMAL HIGH (ref 70–99)
Potassium: 4.4 mmol/L (ref 3.5–5.1)
Sodium: 134 mmol/L — ABNORMAL LOW (ref 135–145)

## 2018-11-28 LAB — CBC WITH DIFFERENTIAL/PLATELET
Abs Immature Granulocytes: 0.02 10*3/uL (ref 0.00–0.07)
Basophils Absolute: 0.1 10*3/uL (ref 0.0–0.1)
Basophils Relative: 1 %
Eosinophils Absolute: 0.1 10*3/uL (ref 0.0–0.5)
Eosinophils Relative: 2 %
HCT: 41.2 % (ref 39.0–52.0)
Hemoglobin: 13.9 g/dL (ref 13.0–17.0)
Immature Granulocytes: 0 %
Lymphocytes Relative: 16 %
Lymphs Abs: 1.1 10*3/uL (ref 0.7–4.0)
MCH: 35.6 pg — ABNORMAL HIGH (ref 26.0–34.0)
MCHC: 33.7 g/dL (ref 30.0–36.0)
MCV: 105.6 fL — ABNORMAL HIGH (ref 80.0–100.0)
Monocytes Absolute: 0.6 10*3/uL (ref 0.1–1.0)
Monocytes Relative: 9 %
Neutro Abs: 5 10*3/uL (ref 1.7–7.7)
Neutrophils Relative %: 72 %
Platelets: 165 10*3/uL (ref 150–400)
RBC: 3.9 MIL/uL — ABNORMAL LOW (ref 4.22–5.81)
RDW: 15.3 % (ref 11.5–15.5)
WBC: 6.9 10*3/uL (ref 4.0–10.5)
nRBC: 0 % (ref 0.0–0.2)

## 2018-11-28 LAB — SARS CORONAVIRUS 2 (TAT 6-24 HRS): SARS Coronavirus 2: NEGATIVE

## 2018-11-29 ENCOUNTER — Encounter: Payer: Self-pay | Admitting: Cardiology

## 2018-11-29 NOTE — Addendum Note (Signed)
Addended by: Tiajuana Amass on: 11/29/2018 02:23 PM   Modules accepted: Level of Service

## 2018-11-29 NOTE — Progress Notes (Signed)
Remote pacemaker transmission.   

## 2018-12-02 ENCOUNTER — Ambulatory Visit (HOSPITAL_COMMUNITY)
Admission: RE | Disposition: A | Discharge status: Home / Self Care | Payer: Medicare Other | Source: Home / Self Care | Attending: Internal Medicine

## 2018-12-02 ENCOUNTER — Ambulatory Visit (HOSPITAL_COMMUNITY)
Admission: RE | Admit: 2018-12-02 | Discharge: 2018-12-02 | Disposition: A | Discharge status: Home / Self Care | Payer: Medicare Other | Attending: Internal Medicine | Admitting: Internal Medicine

## 2018-12-02 ENCOUNTER — Encounter (HOSPITAL_COMMUNITY): Payer: Self-pay | Admitting: Internal Medicine

## 2018-12-02 DIAGNOSIS — E785 Hyperlipidemia, unspecified: Secondary | ICD-10-CM | POA: Diagnosis not present

## 2018-12-02 DIAGNOSIS — I495 Sick sinus syndrome: Secondary | ICD-10-CM

## 2018-12-02 DIAGNOSIS — M069 Rheumatoid arthritis, unspecified: Secondary | ICD-10-CM | POA: Insufficient documentation

## 2018-12-02 DIAGNOSIS — R0609 Other forms of dyspnea: Secondary | ICD-10-CM | POA: Insufficient documentation

## 2018-12-02 DIAGNOSIS — Z7901 Long term (current) use of anticoagulants: Secondary | ICD-10-CM | POA: Insufficient documentation

## 2018-12-02 DIAGNOSIS — Z45010 Encounter for checking and testing of cardiac pacemaker pulse generator [battery]: Secondary | ICD-10-CM | POA: Insufficient documentation

## 2018-12-02 DIAGNOSIS — Z8249 Family history of ischemic heart disease and other diseases of the circulatory system: Secondary | ICD-10-CM | POA: Insufficient documentation

## 2018-12-02 DIAGNOSIS — H409 Unspecified glaucoma: Secondary | ICD-10-CM | POA: Insufficient documentation

## 2018-12-02 DIAGNOSIS — Z87891 Personal history of nicotine dependence: Secondary | ICD-10-CM | POA: Diagnosis not present

## 2018-12-02 DIAGNOSIS — Z79899 Other long term (current) drug therapy: Secondary | ICD-10-CM | POA: Insufficient documentation

## 2018-12-02 DIAGNOSIS — Z880 Allergy status to penicillin: Secondary | ICD-10-CM | POA: Diagnosis not present

## 2018-12-02 DIAGNOSIS — Z950 Presence of cardiac pacemaker: Secondary | ICD-10-CM

## 2018-12-02 DIAGNOSIS — I951 Orthostatic hypotension: Secondary | ICD-10-CM | POA: Diagnosis not present

## 2018-12-02 DIAGNOSIS — I480 Paroxysmal atrial fibrillation: Secondary | ICD-10-CM | POA: Diagnosis not present

## 2018-12-02 DIAGNOSIS — I10 Essential (primary) hypertension: Secondary | ICD-10-CM | POA: Diagnosis not present

## 2018-12-02 DIAGNOSIS — Z882 Allergy status to sulfonamides status: Secondary | ICD-10-CM | POA: Insufficient documentation

## 2018-12-02 HISTORY — PX: PPM GENERATOR CHANGEOUT: EP1233

## 2018-12-02 LAB — SURGICAL PCR SCREEN
MRSA, PCR: NEGATIVE
Staphylococcus aureus: NEGATIVE

## 2018-12-02 SURGERY — PPM GENERATOR CHANGEOUT

## 2018-12-02 MED ORDER — VANCOMYCIN HCL IN DEXTROSE 1-5 GM/200ML-% IV SOLN
INTRAVENOUS | Status: AC
  Filled 2018-12-02: qty 200

## 2018-12-02 MED ORDER — MIDAZOLAM HCL 5 MG/5ML IJ SOLN
INTRAMUSCULAR | Status: DC | PRN
  Administered 2018-12-02: 2 mg via INTRAVENOUS
  Administered 2018-12-02: 1 mg via INTRAVENOUS

## 2018-12-02 MED ORDER — ACETAMINOPHEN 325 MG PO TABS: 325.0000 mg | ORAL_TABLET | ORAL | Status: DC | PRN

## 2018-12-02 MED ORDER — MIDAZOLAM HCL 5 MG/5ML IJ SOLN
INTRAMUSCULAR | Status: AC
  Filled 2018-12-02: qty 5

## 2018-12-02 MED ORDER — LIDOCAINE HCL (PF) 1 % IJ SOLN
INTRAMUSCULAR | Status: AC
  Filled 2018-12-02: qty 60

## 2018-12-02 MED ORDER — FENTANYL CITRATE (PF) 100 MCG/2ML IJ SOLN
INTRAMUSCULAR | Status: AC
  Filled 2018-12-02: qty 2

## 2018-12-02 MED ORDER — FENTANYL CITRATE (PF) 100 MCG/2ML IJ SOLN
INTRAMUSCULAR | Status: DC | PRN
  Administered 2018-12-02 (×2): 25 ug via INTRAVENOUS

## 2018-12-02 MED ORDER — LIDOCAINE HCL (PF) 1 % IJ SOLN
INTRAMUSCULAR | Status: DC | PRN
  Administered 2018-12-02: 45 mL

## 2018-12-02 MED ORDER — SODIUM CHLORIDE 0.9 % IV SOLN
INTRAVENOUS | Status: AC
  Filled 2018-12-02: qty 2

## 2018-12-02 MED ORDER — ONDANSETRON HCL 4 MG/2ML IJ SOLN: 4.0000 mg | Freq: Four times a day (QID) | INTRAMUSCULAR | Status: DC | PRN

## 2018-12-02 MED ORDER — SODIUM CHLORIDE 0.9 % IV SOLN
INTRAVENOUS | Status: DC
  Administered 2018-12-02: 06:00:00 via INTRAVENOUS

## 2018-12-02 MED ORDER — SODIUM CHLORIDE 0.9 % IV SOLN
80.0000 mg | INTRAVENOUS | Status: AC
  Administered 2018-12-02: 80 mg

## 2018-12-02 MED ORDER — VANCOMYCIN HCL IN DEXTROSE 1-5 GM/200ML-% IV SOLN
1000.0000 mg | INTRAVENOUS | Status: AC
  Administered 2018-12-02: 1000 mg via INTRAVENOUS

## 2018-12-02 MED ORDER — MUPIROCIN 2 % EX OINT
TOPICAL_OINTMENT | CUTANEOUS | Status: AC
  Administered 2018-12-02: 1
  Filled 2018-12-02: qty 22

## 2018-12-02 SURGICAL SUPPLY — 6 items
CABLE SURGICAL S-101-97-12 (CABLE) ×3 IMPLANT
IPG PACE AZUR XT DR MRI W1DR01 (Pacemaker) ×1 IMPLANT
PACE AZURE XT DR MRI W1DR01 (Pacemaker) ×3 IMPLANT
PAD PRO RADIOLUCENT 2001M-C (PAD) ×3 IMPLANT
POUCH AIGIS-R ANTIBACT PPM (Mesh General) ×3 IMPLANT
TRAY PACEMAKER INSERTION (PACKS) ×3 IMPLANT

## 2018-12-02 NOTE — Discharge Instructions (Signed)
Moderate Conscious Sedation, Adult, Care After These instructions provide you with information about caring for yourself after your procedure. Your health care provider may also give you more specific instructions. Your treatment has been planned according to current medical practices, but problems sometimes occur. Call your health care provider if you have any problems or questions after your procedure. What can I expect after the procedure? After your procedure, it is common:  To feel sleepy for several hours.  To feel clumsy and have poor balance for several hours.  To have poor judgment for several hours.  To vomit if you eat too soon. Follow these instructions at home: For at least 24 hours after the procedure:   Do not: ? Participate in activities where you could fall or become injured. ? Drive. ? Use heavy machinery. ? Drink alcohol. ? Take sleeping pills or medicines that cause drowsiness. ? Make important decisions or sign legal documents. ? Take care of children on your own.  Rest. Eating and drinking  Follow the diet recommended by your health care provider.  If you vomit: ? Drink water, juice, or soup when you can drink without vomiting. ? Make sure you have little or no nausea before eating solid foods. General instructions  Have a responsible adult stay with you until you are awake and alert.  Take over-the-counter and prescription medicines only as told by your health care provider.  If you smoke, do not smoke without supervision.  Keep all follow-up visits as told by your health care provider. This is important. Contact a health care provider if:  You keep feeling nauseous or you keep vomiting.  You feel light-headed.  You develop a rash.  You have a fever. Get help right away if:  You have trouble breathing. This information is not intended to replace advice given to you by your health care provider. Make sure you discuss any questions you  have with your health care provider. Document Released: 11/27/2012 Document Revised: 01/19/2017 Document Reviewed: 05/29/2015 Elsevier Patient Education  [place] After This sheet gives you information about how to care for yourself after your procedure. Your health care provider may also give you more specific instructions. If you have problems or questions, contact your health care provider. What can I expect after the procedure? After your procedure, it is common to have:  Pain or soreness at the site where the pacemaker was inserted.  Swelling at the site where the pacemaker was inserted. Follow these instructions at home: Incision care   Keep the incision clean and dry. ? Do not take baths, swim, or use a hot tub until your health care provider approves. ? You may shower the day after your procedure, or as directed by your health care provider. ? Pat the area dry with a clean towel. Do not rub the area. This may cause bleeding.  Follow instructions from your health care provider about how to take care of your incision. Make sure you: ? Wash your hands with soap and water before you change your bandage (dressing). If soap and water are not available, use hand sanitizer. ? Change your dressing as told by your health care provider. ? Leave stitches (sutures), skin glue, or adhesive strips in place. These skin closures may need to stay in place for 2 weeks or longer. If adhesive strip edges start to loosen and curl up, you may trim the loose edges. Do not remove adhesive strips completely unless  your health care provider tells you to do that.  Check your incision area every day for signs of infection. Check for: ? More redness, swelling, or pain. ? More fluid or blood. ? Warmth. ? Pus or a bad smell. Activity  Do not lift anything that is heavier than 10 lb (4.5 kg) until your health care provider says it is okay to do so.  For the first  2 weeks, or as long as told by your health care provider: ? Avoid lifting your left arm higher than your shoulder. ? Be gentle when you move your arms over your head. It is okay to raise your arm to comb your hair. ? Avoid strenuous exercise.  Ask your health care provider when it is okay to: ? Resume your normal activities. ? Return to work or school. ? Resume sexual activity. Eating and drinking  Eat a heart-healthy diet. This should include plenty of fresh fruits and vegetables, whole grains, low-fat dairy products, and lean protein like chicken and fish.  Limit alcohol intake to no more than 1 drink a day for non-pregnant women and 2 drinks a day for men. One drink equals 12 oz of beer, 5 oz of wine, or 1 oz of hard liquor.  Check ingredients and nutrition facts on packaged foods and beverages. Avoid the following types of food: ? Food that is high in salt (sodium). ? Food that is high in saturated fat, like full-fat dairy or red meat. ? Food that is high in trans fat, like fried food. ? Food and drinks that are high in sugar. Lifestyle  Do not use any products that contain nicotine or tobacco, such as cigarettes and e-cigarettes. If you need help quitting, ask your health care provider.  Take steps to manage and control your weight.  Get regular exercise. Aim for 150 minutes of moderate-intensity exercise (such as walking or yoga) or 75 minutes of vigorous exercise (such as running or swimming) each week.  Manage other health problems, such as diabetes or high blood pressure. Ask your health care provider how you can manage these conditions. General instructions  Do not drive for 24 hours after your procedure if you were given a medicine to help you relax (sedative).  Take over-the-counter and prescription medicines only as told by your health care provider.  Avoid putting pressure on the area where the pacemaker was placed.  If you need an MRI after your pacemaker has been  placed, be sure to tell the health care provider who orders the MRI that you have a pacemaker.  Avoid close and prolonged exposure to electrical devices that have strong magnetic fields. These include: ? Cell phones. Avoid keeping them in a pocket near the pacemaker, and try using the ear opposite the pacemaker. ? MP3 players. ? Household appliances, like microwaves. ? Metal detectors. ? Electric generators. ? High-tension wires.  Keep all follow-up visits as directed by your health care provider. This is important. Contact a health care provider if:  You have pain at the incision site that is not relieved by over-the-counter or prescription medicines.  You have any of these around your incision site or coming from it: ? More redness, swelling, or pain. ? Fluid or blood. ? Warmth to the touch. ? Pus or a bad smell.  You have a fever.  You feel brief, occasional palpitations, light-headedness, or any symptoms that you think might be related to your heart. Get help right away if:  You experience chest pain  that is different from the pain at the pacemaker site.  You develop a red streak that extends above or below the incision site.  You experience shortness of breath.  You have palpitations or an irregular heartbeat.  You have light-headedness that does not go away quickly.  You faint or have dizzy spells.  Your pulse suddenly drops or increases rapidly and does not return to normal.  You begin to gain weight and your legs and ankles swell. Summary  After your procedure, it is common to have pain, soreness, and some swelling where the pacemaker was inserted.  Make sure to keep your incision clean and dry. Follow instructions from your health care provider about how to take care of your incision.  Check your incision every day for signs of infection, such as more pain or swelling, pus or a bad smell, warmth, or leaking fluid and blood.  Avoid strenuous exercise and  lifting your left arm higher than your shoulder for 2 weeks, or as long as told by your health care provider. This information is not intended to replace advice given to you by your health care provider. Make sure you discuss any questions you have with your health care provider. Document Released: 11/27/2012 Document Revised: 01/19/2017 Document Reviewed: 12/30/2015 Elsevier Patient Education  2020 [employer].

## 2018-12-02 NOTE — H&P (Signed)
Patient Care Team: Pleas Koch, NP as PCP - General (Internal Medicine) Deboraha Sprang, MD as Consulting Physician (Cardiology) Marlowe Sax, MD as Referring Physician (Internal Medicine) Birder Robson, MD as Referring Physician (Ophthalmology) Oneta Rack, MD as Referring Physician (Dermatology) Salli Real, DDS as Referring Physician (Dentistry)   HPI  Darryl White is a 78 y.o. malen admitted for pacemaker generator replacement.  Original implant [id number] and generator 2012 replacement New episode AFib>> [facility] 8/16 and converted spontanously   He had atrial undersensing resulting in failure to mode switch and tracking at the upper rate limit. PM implanted 2008 for syncope. He has had no recurrent syncope, presumably neurally mediated by the family's description  Significant worsening of duyspnea over the last month or so, no chest pain Lungs " shot" ongoing surveillance for a second lung tumor, rad Rx for the first     Date Cr Hgb  6/18 1.1 14.9  1/19  1.14 15.2  4/20 1.37 14.9   Thromboembolic risk factors ( age  -2) for a CHADSVASc Score of 2   Myoview 8/16 EF 65% No ischemia  Lung Ca s/p radiation Therapy now w another lesion noted >> repeat CT pending      Records and Results Reviewed   Past Medical History:  Diagnosis Date  . Arthritis 2019   dx of osteoarthritis and rhemautoid arthritis  . Cataract 2019   left eye  . Chicken pox   . Collagen vascular disease ([place])   . 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 Diverticulitis   . 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 History of stomach ulcers   . Hyperlipidemia   . Lymphocytic colitis   . Orthostatic hypotension   . Pancreatitis   . Paroxysmal atrial fibrillation [facility])     Past Surgical History:  Procedure Laterality Date  . APPENDECTOMY    . BACK SURGERY    . large intestine block    . PACEMAKER INSERTION     MDT dual chamber pacemaker  .  TONSILLECTOMY AND ADENOIDECTOMY      Current Facility-Administered Medications  Medication Dose Route Frequency Provider Last Rate Last Dose  . 0.9 %  sodium chloride infusion   Intravenous Continuous Deboraha Sprang, MD 50 mL/hr at 12/02/18 [id number]    . gentamicin (GARAMYCIN) 80 mg in sodium chloride 0.9 % 500 mL irrigation  80 mg Irrigation On Call Deboraha Sprang, MD      . vancomycin (VANCOCIN) IVPB 1000 mg/200 mL premix  1,000 mg Intravenous On Call Deboraha Sprang, MD        Allergies  Allergen Reactions  . Sulfa Antibiotics Hives and Itching  . Penicillins Hives and Palpitations    Did it involve swelling of the face/tongue/throat, SOB, or low BP? No Did it involve sudden or severe rash/hives, skin peeling, or any reaction on the inside of your mouth or nose? No Did you need to seek medical attention at a hospital or doctor's office? Unknown When did it last happen?childhood allergy If all above answers are "NO", may proceed with cephalosporin use.       Social History   Tobacco Use  . Smoking status: Former Smoker    Packs/day: 1.00    Years: 57.00    Pack years: 57.00    Quit date: 04/29/2017    Years since quitting: 1.5  . Smokeless tobacco: Never Used  Substance  Use Topics  . Alcohol use: Yes    Alcohol/week: 5.0 standard drinks    Types: 5 Glasses of wine per week  . Drug use: No     Family History  Problem Relation Age of Onset  . Cancer Mother   . Heart disease Father      Current Meds  Medication Sig  . albuterol (PROAIR HFA) 108 (90 Base) MCG/ACT inhaler Inhale 1-2 puffs into the lungs every 6 (six) hours as needed for wheezing or shortness of breath.  . brimonidine (ALPHAGAN) 0.15 % ophthalmic solution Place 1 drop into both eyes 2 (two) times daily.   . Cholecalciferol (VITAMIN D3) 2000 UNITS capsule Take 2,000 Units by mouth daily.  Marland Kitchen dextromethorphan-guaiFENesin (MUCINEX DM) 30-600 MG 12hr tablet Take 1 tablet by mouth 2 (two) times daily.  Marland Kitchen  diltiazem (CARDIZEM) 30 MG tablet Take 1 tablet (30 mg total) by mouth 2 (two) times daily as needed. (Patient taking differently: Take 30 mg by mouth 2 (two) times daily as needed (afib). )  . ELIQUIS 5 MG TABS tablet TAKE 1 TABLET BY MOUTH TWICE DAILY (Patient taking differently: Take 5 mg by mouth 2 (two) times daily. )  . famotidine (PEPCID) 20 MG tablet Take 20 mg by mouth 2 (two) times daily.  . fenofibrate (TRICOR) 145 MG tablet TAKE 1 TABLET BY MOUTH ONCE DAILY (Patient taking differently: Take 145 mg by mouth daily. )  . folic acid (FOLVITE) 1 MG tablet Take 1 mg by mouth daily.  Marland Kitchen gabapentin (NEURONTIN) 100 MG capsule Take 100 mg by mouth 3 (three) times daily.  . methotrexate 2.5 MG tablet Take 12.5 mg by mouth every Sunday.   . sodium chloride (OCEAN) 0.65 % SOLN nasal spray Place 1 spray into both nostrils as needed for congestion.  Marland Kitchen tiotropium (SPIRIVA HANDIHALER) 18 MCG inhalation capsule Place 1 capsule (18 mcg total) into inhaler and inhale daily.  . traMADol (ULTRAM) 50 MG tablet Take 25-50 mg by mouth every 12 (twelve) hours as needed for moderate pain.      Review of Systems negative except from HPI and PMH  Physical Exam BP 99/70   Pulse 67   Temp 97.9 F (36.6 C) (Oral)   Resp 16   Ht 5\' 6"  (1.676 m)   Wt 76.7 kg   SpO2 96%   BMI 27.28 kg/m  Well developed and well nourished in no acute distress HENT normal E scleral and icterus clear Neck Supple JVP flat; carotids brisk and full Clear to ausculation Device pocket well healed; without hematoma or erythema.  There is no tethering   Regular rate and rhythm, no murmurs gallops or rub Soft with active bowel sounds No clubbing cyanosis  Edema Alert and oriented, grossly normal motor and sensory function Skin Warm and Dry    Assessment and  Plan Syncope-neurally mediated presumed  Labile hypertension and orthostatic hypotension  Pacemaker-[employer]  The patient's device was interrogated.  The  information was reviewed. No changes were made in the programming.   Device approaching ERI.     Atrial fibrillation-paroxysmal  Sinus node dysfunction   Dyspnea on exertion   reverted to VVI and this is coincidental with his acute deterioration in symptoms .We have reviewed the benefits and risks of generator replacement.  These include but are not limited to lead fracture and infection.  The patient understands, agrees and is willing to proceed.    Given that this is third procedure, will use Aegis pouch

## 2018-12-03 MED FILL — Gentamicin Sulfate Inj 40 MG/ML: INTRAMUSCULAR | Qty: 80 | Status: AC

## 2018-12-03 MED FILL — Vancomycin HCl-Dextrose IV Soln 1 GM/200ML-5%: INTRAVENOUS | Qty: 200 | Status: AC

## 2018-12-07 LAB — AEROBIC/ANAEROBIC CULTURE W GRAM STAIN (SURGICAL/DEEP WOUND)
Culture: NO GROWTH
Gram Stain: NONE SEEN

## 2018-12-16 ENCOUNTER — Other Ambulatory Visit: Payer: Self-pay | Admitting: Internal Medicine

## 2018-12-17 ENCOUNTER — Telehealth: Payer: Self-pay | Admitting: Internal Medicine

## 2018-12-17 MED ORDER — SPIRIVA HANDIHALER 18 MCG IN CAPS: 18.0000 ug | ORAL_CAPSULE | Freq: Every day | RESPIRATORY_TRACT | 2 refills | Status: DC

## 2018-12-17 NOTE — Telephone Encounter (Signed)
Verbal given from Dr. Darnell Level to send refill in her name. Refill sent.   Call made to patient, confirmed DOB, medication, and pharmacy. Made aware refill has been sent. Voiced understanding. Nothing further needed at this time.

## 2018-12-19 ENCOUNTER — Ambulatory Visit (INDEPENDENT_AMBULATORY_CARE_PROVIDER_SITE_OTHER): Payer: Medicare Other | Admitting: *Deleted

## 2018-12-19 ENCOUNTER — Other Ambulatory Visit: Payer: Self-pay

## 2018-12-19 DIAGNOSIS — I495 Sick sinus syndrome: Secondary | ICD-10-CM

## 2018-12-19 LAB — CUP PACEART INCLINIC DEVICE CHECK
Battery Remaining Longevity: 175 mo
Battery Voltage: 3.23 V
Brady Statistic AP VP Percent: 0.03 %
Brady Statistic AP VS Percent: 78.21 %
Brady Statistic AS VP Percent: 0.01 %
Brady Statistic AS VS Percent: 21.75 %
Brady Statistic RA Percent Paced: 77.55 %
Brady Statistic RV Percent Paced: 0.04 %
Date Time Interrogation Session: 20201029090743
Implantable Lead Implant Date: 19941110
Implantable Lead Implant Date: 19941110
Implantable Lead Location: 753859
Implantable Lead Location: 753860
Implantable Lead Model: 5034
Implantable Lead Model: 5534
Implantable Pulse Generator Implant Date: 20201012
Lead Channel Impedance Value: 779 Ohm
Lead Channel Impedance Value: 817 Ohm
Lead Channel Impedance Value: 950 Ohm
Lead Channel Impedance Value: 950 Ohm
Lead Channel Pacing Threshold Amplitude: 0.5 V
Lead Channel Pacing Threshold Amplitude: 0.5 V
Lead Channel Pacing Threshold Pulse Width: 0.4 ms
Lead Channel Pacing Threshold Pulse Width: 0.4 ms
Lead Channel Sensing Intrinsic Amplitude: 1.625 mV
Lead Channel Sensing Intrinsic Amplitude: 11.625 mV
Lead Channel Setting Pacing Amplitude: 1.5 V
Lead Channel Setting Pacing Amplitude: 2.5 V
Lead Channel Setting Pacing Pulse Width: 0.4 ms
Lead Channel Setting Sensing Sensitivity: 4 mV

## 2018-12-19 NOTE — Progress Notes (Signed)
Wound check appointment. Derma bond  removed prior to appointment . Wound without redness or edema. Incision edges approximated, wound well healed. Normal device function. Thresholds, sensing, and impedances consistent with implant measurements, chronic leads with generator change.  Histogram distribution appropriate for patient and level of activity. No mode switches or high ventricular rates noted. Patient educated about wound care. ROV 03/17/18 with SK. Next remote on 06/18/19.

## 2018-12-20 LAB — CUP PACEART REMOTE DEVICE CHECK
Battery Remaining Longevity: 175 mo
Battery Voltage: 3.23 V
Brady Statistic AP VP Percent: 0.03 %
Brady Statistic AP VS Percent: 78.67 %
Brady Statistic AS VP Percent: 0.01 %
Brady Statistic AS VS Percent: 21.29 %
Brady Statistic RA Percent Paced: 78.04 %
Brady Statistic RV Percent Paced: 0.04 %
Date Time Interrogation Session: 20201029133702
Implantable Lead Implant Date: 19941110
Implantable Lead Implant Date: 19941110
Implantable Lead Location: 753859
Implantable Lead Location: 753860
Implantable Lead Model: 5034
Implantable Lead Model: 5534
Implantable Pulse Generator Implant Date: 20201012
Lead Channel Impedance Value: 779 Ohm
Lead Channel Impedance Value: 817 Ohm
Lead Channel Impedance Value: 950 Ohm
Lead Channel Impedance Value: 950 Ohm
Lead Channel Pacing Threshold Amplitude: 0.5 V
Lead Channel Pacing Threshold Amplitude: 0.5 V
Lead Channel Pacing Threshold Pulse Width: 0.4 ms
Lead Channel Pacing Threshold Pulse Width: 0.4 ms
Lead Channel Sensing Intrinsic Amplitude: 1.25 mV
Lead Channel Sensing Intrinsic Amplitude: 1.625 mV
Lead Channel Sensing Intrinsic Amplitude: 10.375 mV
Lead Channel Sensing Intrinsic Amplitude: 11.625 mV
Lead Channel Setting Pacing Amplitude: 1.5 V
Lead Channel Setting Pacing Amplitude: 2.5 V
Lead Channel Setting Pacing Pulse Width: 0.4 ms
Lead Channel Setting Sensing Sensitivity: 4 mV

## 2019-01-06 ENCOUNTER — Other Ambulatory Visit: Payer: Self-pay

## 2019-01-06 ENCOUNTER — Encounter: Payer: Self-pay | Admitting: Pulmonary Disease

## 2019-01-06 ENCOUNTER — Ambulatory Visit: Payer: Medicare Other | Admitting: Pulmonary Disease

## 2019-01-06 VITALS — BP 130/70 | HR 84 | Temp 97.9°F | Ht 66.0 in | Wt 172.8 lb

## 2019-01-06 DIAGNOSIS — R918 Other nonspecific abnormal finding of lung field: Secondary | ICD-10-CM

## 2019-01-06 DIAGNOSIS — M0579 Rheumatoid arthritis with rheumatoid factor of multiple sites without organ or systems involvement: Secondary | ICD-10-CM

## 2019-01-06 DIAGNOSIS — J449 Chronic obstructive pulmonary disease, unspecified: Secondary | ICD-10-CM

## 2019-01-06 DIAGNOSIS — J84112 Idiopathic pulmonary fibrosis: Secondary | ICD-10-CM | POA: Diagnosis not present

## 2019-01-06 MED ORDER — BUDESONIDE-FORMOTEROL FUMARATE 80-4.5 MCG/ACT IN AERO: 2.0000 | INHALATION_SPRAY | Freq: Every day | RESPIRATORY_TRACT | 12 refills | Status: DC

## 2019-01-06 MED ORDER — BUDESONIDE-FORMOTEROL FUMARATE 80-4.5 MCG/ACT IN AERO: 2.0000 | INHALATION_SPRAY | Freq: Two times a day (BID) | RESPIRATORY_TRACT | 0 refills | Status: DC

## 2019-01-06 NOTE — Progress Notes (Signed)
Subjective:    Patient ID: Darryl White, male    DOB: September 18, 1940, 78 y.o.   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  He also was noted to have PET avid lung nodules on the right lower lobe for presumed non-small cell lung cancer.  He has a prior history of asbestos exposure as well.  Rheumatoid arthritis is being managed with methotrexate.  His pulmonary fibrosis has been stable throughout the years.  He does have also superimposed emphysema.  He previously followed with Dr. Ashby Dawes and we are assuming care as Dr. Ashby Dawes has left the practice.  He last saw Dr. Ashby Dawes on 02 October 2018 and at that time no new issues were noted and he was instructed to continue Spiriva.  Patient presents today stating that he feels that Spiriva could be "stronger" he notes that he has issues with dyspnea on exertion which perhaps has been getting slightly worse over the last several months.  He also notices "mucus" buildup.  He has not had any fevers, chills or sweats.  No cough or sputum production.  No hemoptysis, he voices no other complaints.  He had a pacemaker replaced on October 12 by [name].  No other issues noted.  No orthopnea, paroxysmal nocturnal dyspnea, lower extremity edema.  He does have occasional wheezing.  Patient had chest CT on 29 October 2018, this showed decreased size on the nodules previously treated by SBRT.  There is a right upper lobe subpleural pulmonary nodule, not amenable to diagnosis by bronchoscopy, which has shown some slight increase in size and 53-month follow-up is recommended.  This has been ordered per review of the imaging.  Review of Systems A 10 point review of systems was performed and it is as noted above otherwise negative.    Objective:   Physical Exam BP 130/70 (BP Location: Left  Arm, Cuff Size: Normal)   Pulse 84   Temp 97.9 F (36.6 C) (Temporal)   Ht 5\' 6"  (1.676 m)   Wt 172 lb 12.8 oz (78.4 kg)   SpO2 94%   BMI 27.89 kg/m  GENERAL: Awake alert elderly male, conversant, no distress, fully ambulatory HEAD: Normocephalic, atraumatic.  EYES: Pupils equal, round, reactive to light.  No scleral icterus.  MOUTH: Nose/mouth/throat not examined due to masking requirements for COVID 19. NECK: Supple. No thyromegaly. No nodules. No JVD.  Trachea midline, no lymphadenopathy. PULMONARY: Very distant lung sounds with no adventitious sounds. CARDIOVASCULAR: S1 and S2. Regular rate and rhythm.  No murmur appreciated. GASTROINTESTINAL: Nondistended abdomen. MUSCULOSKELETAL: No joint swelling, no clubbing, no edema.  NEUROLOGIC: No focal deficit.  Fluent speech. SKIN: Intact,warm,dry.  Limited exam no rashes noted. PSYCH: Mood and behavior normal.  October 29, 2018 chest CT, peripheral base lung nodule right upper lobe, severe pulmonary emphysema and underlying fibrosis with honeycombing.  Nodule not amenable to biopsy by bronchoscopic means.     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  Right upper lobe pleural-based lung mass Prior suspicion of right lower lobe non-small cell cancer status post SBRT Current pleural-based mass not amenable to diagnosis via bronchoscopic means High risk for  pneumothorax from percutaneous needle biopsy due to emphysematous changes surrounding lung mass Prior history of asbestos exposure/tobacco use Continue surveillance, repeat CT scan in 3 months  Pulmonary fibrosis This issue adds complexity to his management May be related to underlying rheumatoid arthritis   Rheumatoid arthritis on methotrexate This issue adds complexity to his management  Follow-up in 3 months time after his CT scan of the chest is done we will review findings with him.  Renold Don, MD  PCCM  *This note was dictated using voice recognition software/Dragon.  Despite best efforts to proofread, errors can occur which can change the meaning.  Any change was purely unintentional.

## 2019-01-06 NOTE — Patient Instructions (Signed)
See you back in 2 months time.\  We have added Symbicort to your regimen.

## 2019-02-10 ENCOUNTER — Other Ambulatory Visit: Payer: Medicare Other

## 2019-02-26 ENCOUNTER — Other Ambulatory Visit: Payer: Self-pay | Admitting: Primary Care

## 2019-02-26 DIAGNOSIS — E785 Hyperlipidemia, unspecified: Secondary | ICD-10-CM

## 2019-02-26 MED ORDER — FENOFIBRATE 145 MG PO TABS: 145.0000 mg | ORAL_TABLET | Freq: Every day | ORAL | 1 refills | Status: DC

## 2019-02-27 ENCOUNTER — Other Ambulatory Visit: Payer: Self-pay

## 2019-02-27 ENCOUNTER — Ambulatory Visit
Admission: RE | Admit: 2019-02-27 | Discharge: 2019-02-27 | Disposition: A | Discharge status: Home / Self Care | Payer: Medicare PPO | Source: Ambulatory Visit | Attending: Radiation Oncology | Admitting: Radiation Oncology

## 2019-02-27 DIAGNOSIS — R918 Other nonspecific abnormal finding of lung field: Secondary | ICD-10-CM | POA: Diagnosis present

## 2019-02-27 HISTORY — DX: Malignant (primary) neoplasm, unspecified: C80.1

## 2019-02-27 LAB — POCT I-STAT CREATININE: Creatinine, Ser: 1.2 mg/dL (ref 0.61–1.24)

## 2019-02-27 MED ORDER — IOHEXOL 300 MG/ML  SOLN
75.0000 mL | Freq: Once | INTRAMUSCULAR | Status: AC | PRN
  Administered 2019-02-27: 75 mL via INTRAVENOUS

## 2019-03-03 ENCOUNTER — Ambulatory Visit: Payer: Medicare Other | Admitting: Pulmonary Disease

## 2019-03-06 ENCOUNTER — Ambulatory Visit (INDEPENDENT_AMBULATORY_CARE_PROVIDER_SITE_OTHER): Payer: Medicare PPO

## 2019-03-06 ENCOUNTER — Other Ambulatory Visit: Payer: Self-pay

## 2019-03-06 ENCOUNTER — Ambulatory Visit: Payer: Medicare Other

## 2019-03-06 DIAGNOSIS — Z0000 Encounter for general adult medical examination without abnormal findings: Secondary | ICD-10-CM

## 2019-03-06 NOTE — Progress Notes (Signed)
PCP notes:  Health Maintenance: No gaps noted   Abnormal Screenings: none   Patient concerns: Patient has a hiatal hernia. Patient has abdominal discomfort when eating spicy foods.   Nurse concerns: none   Next PCP appt.: 03/13/2019 @ 7:40 am

## 2019-03-06 NOTE — Progress Notes (Signed)
Subjective:   ANKUR SNOWDON is a 79 y.o. male who presents for Medicare Annual/Subsequent preventive examination.  Review of Systems: N/A   This visit is being conducted through telemedicine via telephone at the nurse health advisor's home address due to the COVID-19 pandemic. This patient has given me verbal consent via doximity to conduct this visit, patient states they are participating from their home address. Patient and myself are on the telephone call. There is no referral for this visit. Some vital signs may be absent or patient reported.    Patient identification: identified by name, DOB, and current address   Cardiac Risk Factors include: advanced age (>107men, >15 women);male gender;dyslipidemia     Objective:    Vitals: There were no vitals taken for this visit.  There is no height or weight on file to calculate BMI.  Advanced Directives 03/06/2019 12/02/2018 11/04/2018 08/05/2018 02/28/2018 02/22/2017 02/08/2016  Does Patient Have a Medical Advance Directive? No Yes No No No No No  Type of Advance Directive - [place]  Does patient want to make changes to medical advance directive? - No - Patient declined - - - - -  Copy of Bond in Chart? - No - copy requested - - - - -  Would patient like information on creating a medical advance directive? Yes (MAU/Ambulatory/Procedural Areas - Information given) - No - Patient declined No - Patient declined No - Patient declined Yes (MAU/Ambulatory/Procedural Areas - Information given) -    Tobacco Social History   Tobacco Use  Smoking Status Former Smoker  . Packs/day: 1.00  . Years: 57.00  . Pack years: 57.00  . Quit date: 04/29/2017  . Years since quitting: 1.8  Smokeless Tobacco Never Used     Counseling given: Not Answered   Clinical Intake:  Pre-visit preparation completed: Yes  Pain : No/denies pain     Nutritional Risks: None Diabetes: No  How often do you  need to have someone help you when you read instructions, pamphlets, or other written materials from your doctor or pharmacy?: 1 - Never What is the last grade level you completed in school?: masters  Interpreter Needed?: No  Information entered by :: CJohnson, LPN  Past Medical History:  Diagnosis Date  . Arthritis 2019   dx of osteoarthritis and rhemautoid arthritis  . Cancer ([place])   . Cataract 2019   left eye  . Chicken pox   . Collagen vascular disease ([place])   . 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 Diverticulitis   . 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 History of stomach ulcers   . Hyperlipidemia   . Lymphocytic colitis   . Orthostatic hypotension   . Pancreatitis   . Paroxysmal atrial fibrillation [facility])    Past Surgical History:  Procedure Laterality Date  . APPENDECTOMY    . BACK SURGERY    . large intestine block    . PACEMAKER INSERTION     MDT dual chamber pacemaker  . PPM GENERATOR CHANGEOUT N/A 12/02/2018   Procedure: PPM GENERATOR CHANGEOUT;  Surgeon: Deboraha Sprang, MD;  Location: [facility];  Service: Cardiovascular;  Laterality: N/A;  . TONSILLECTOMY AND ADENOIDECTOMY     Family History  Problem Relation Age of Onset  . Cancer Mother   . Heart disease Father    Social History   Socioeconomic History  .  Marital status: Married    Spouse name: Not on file  . Number of children: 2  . Years of education: Not on file  . Highest education level: Not on file  Occupational History  . Occupation: Retired  Tobacco Use  . Smoking status: Former Smoker    Packs/day: 1.00    Years: 57.00    Pack years: 57.00    Quit date: 04/29/2017    Years since quitting: 1.8  . Smokeless tobacco: Never Used  Substance and Sexual Activity  . Alcohol use: Yes    Alcohol/week: 5.0 standard drinks    Types: 5 Glasses of wine per week  . Drug use: No  . Sexual activity: Never  Other Topics Concern  . Not on file  Social History  Narrative   Married.   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      Does not have a living will.   Desires CPR, does not want prolonged life support if futile.   Social Determinants of Health   Financial Resource Strain: Low Risk   . Difficulty of Paying Living Expenses: Not hard at all  Food Insecurity: No Food Insecurity  . Worried About Charity fundraiser in the Last Year: Never true  . Ran Out of Food in the Last Year: Never true  Transportation Needs: No Transportation Needs  . Lack of Transportation (Medical): No  . Lack of Transportation (Non-Medical): No  Physical Activity: Sufficiently Active  . Days of Exercise per Week: 7 days  . Minutes of Exercise per Session: 60 min  Stress: No Stress Concern Present  . Feeling of Stress : Not at all  Social Connections:   . Frequency of Communication with Friends and Family: Not on file  . Frequency of Social Gatherings with Friends and Family: Not on file  . Attends Religious Services: Not on file  . Active Member of Clubs or Organizations: Not on file  . Attends Archivist Meetings: Not on file  . Marital Status: Not on file    Outpatient Encounter Medications as of 03/06/2019  Medication Sig  . albuterol (PROAIR HFA) 108 (90 Base) MCG/ACT inhaler Inhale 1-2 puffs into the lungs every 6 (six) hours as needed for wheezing or shortness of breath.  . brimonidine (ALPHAGAN) 0.15 % ophthalmic solution Place 1 drop into both eyes 2 (two) times daily.   . budesonide-formoterol (SYMBICORT) 80-4.5 MCG/ACT inhaler Inhale 2 puffs into the lungs daily.  . Cholecalciferol (VITAMIN D3) 2000 UNITS capsule Take 2,000 Units by mouth daily.  Marland Kitchen dextromethorphan-guaiFENesin (MUCINEX DM) 30-600 MG 12hr tablet Take 1 tablet by mouth 2 (two) times daily.  Marland Kitchen ELIQUIS 5 MG TABS tablet TAKE 1 TABLET BY MOUTH TWICE DAILY (Patient taking differently: Take 5 mg by mouth 2 (two) times daily. )  . famotidine (PEPCID) 20 MG tablet Take 20  mg by mouth 2 (two) times daily.  . fenofibrate (TRICOR) 145 MG tablet Take 1 tablet (145 mg total) by mouth daily.  . folic acid (FOLVITE) 1 MG tablet Take 1 mg by mouth daily.  Marland Kitchen gabapentin (NEURONTIN) 100 MG capsule Take 100 mg by mouth 3 (three) times daily.  . methotrexate 2.5 MG tablet Take 12.5 mg by mouth every Sunday.   . sodium chloride (OCEAN) 0.65 % SOLN nasal spray Place 1 spray into both nostrils as needed for congestion.  Marland Kitchen SPIRIVA HANDIHALER 18 MCG inhalation capsule PLACE 1 CAPSULE INTO INHALER AND INHALE DAILY  . traMADol (ULTRAM) 50 MG  tablet Take 25-50 mg by mouth every 12 (twelve) hours as needed for moderate pain.   . budesonide-formoterol (SYMBICORT) 80-4.5 MCG/ACT inhaler Inhale 2 puffs into the lungs 2 (two) times daily.  . budesonide-formoterol (SYMBICORT) 80-4.5 MCG/ACT inhaler Inhale 2 puffs into the lungs 2 (two) times daily.  Marland Kitchen diltiazem (CARDIZEM) 30 MG tablet Take 1 tablet (30 mg total) by mouth 2 (two) times daily as needed. (Patient taking differently: Take 30 mg by mouth 2 (two) times daily as needed (afib). )   No facility-administered encounter medications on file as of 03/06/2019.    Activities of Daily Living In your present state of health, do you have any difficulty performing the following activities: 03/06/2019 12/02/2018  Hearing? N N  Vision? N N  Difficulty concentrating or making decisions? N N  Walking or climbing stairs? N N  Dressing or bathing? N N  Doing errands, shopping? N -  Preparing Food and eating ? N -  Using the Toilet? N -  In the past six months, have you accidently leaked urine? N -  Do you have problems with loss of bowel control? N -  Managing your Medications? N -  Managing your Finances? N -  Housekeeping or managing your Housekeeping? N -  Some recent data might be hidden    Patient Care Team: Pleas Koch, NP as PCP - General (Internal Medicine) Deboraha Sprang, MD as Consulting Physician  (Cardiology) Marlowe Sax, MD as Referring Physician (Internal Medicine) Birder Robson, MD as Referring Physician (Ophthalmology) Oneta Rack, MD as Referring Physician (Dermatology) Salli Real, DDS as Referring Physician (Dentistry)   Assessment:   This is a routine wellness examination for Rik.  Exercise Activities and Dietary recommendations Current Exercise Habits: Home exercise routine, Type of exercise: walking, Time (Minutes): 60, Frequency (Times/Week): 7, Weekly Exercise (Minutes/Week): 420, Intensity: Moderate, Exercise limited by: None identified  Goals    . Increase physical activity     Starting 02/28/2018, I will continue to walk at least 30 minutes daily.     . Patient Stated     03/06/2019, I will continue to walk daily as weather permits.        Fall Risk Fall Risk  03/06/2019 02/28/2018 02/22/2017 02/08/2016 10/21/2014  Falls in the past year? 0 0 No No No  Number falls in past yr: 0 - - - -  Injury with Fall? 0 - - - -  Risk for fall due to : Medication side effect - - - -  Follow up Falls evaluation completed;Falls prevention discussed - - - -   Is the patient's home free of loose throw rugs in walkways, pet beds, electrical cords, etc?   yes      Grab bars in the bathroom? yes      Handrails on the stairs?   yes      Adequate lighting?   yes  Timed Get Up and Go Performed: N/A  Depression Screen PHQ 2/9 Scores 03/06/2019 02/28/2018 02/22/2017 02/08/2016  PHQ - 2 Score 0 0 0 0  PHQ- 9 Score 0 0 0 -    Cognitive Function MMSE - Mini Mental State Exam 03/06/2019 02/28/2018 02/22/2017 02/08/2016  Orientation to time 5 5 5 5   Orientation to Place 5 5 5 5   Registration 3 3 3 3   Attention/ Calculation 5 0 0 0  Recall 3 3 3 3   Language- name 2 objects - 0 0 0  Language- repeat 1 1 1  1  Language- follow 3 step command - 3 3 3   Language- read & follow direction - 0 0 0  Write a sentence - 0 0 0  Copy design - 0 0 0  Total score - 20 20 20    Mini Cog  Mini-Cog screen was completed. Maximum score is 22. A value of 0 denotes this part of the MMSE was not completed or the patient failed this part of the Mini-Cog screening.       Immunization History  Administered Date(s) Administered  . Influenza, High Dose Seasonal PF 12/08/2017, 10/26/2018  . Influenza, Seasonal, Injecte, Preservative Fre 12/09/2017  . Influenza,inj,Quad PF,6+ Mos 12/15/2016  . Influenza-Unspecified 11/24/2015  . Pneumococcal Conjugate-13 01/30/2014  . Pneumococcal Polysaccharide-23 11/25/2015  . Td 05/04/2016  . Zoster 10/21/2011  . Zoster Recombinat (Shingrix) 09/17/2018, 11/18/2018    Qualifies for Shingles Vaccine? Completed series   Screening Tests Health Maintenance  Topic Date Due  . COLONOSCOPY  07/05/2020  . TETANUS/TDAP  05/05/2026  . INFLUENZA VACCINE  Completed  . PNA vac Low Risk Adult  Completed   Cancer Screenings: Lung: Low Dose CT Chest recommended if Age 23-80 years, 30 pack-year currently smoking OR have quit w/in 15years. Patient does not qualify. Colorectal: completed 07/06/2015  Additional Screenings:  Hepatitis C Screening: N/A      Plan:    Patient will continue walking daily.   I have personally reviewed and noted the following in the patient's chart:   . Medical and social history . Use of alcohol, tobacco or illicit drugs  . Current medications and supplements . Functional ability and status . Nutritional status . Physical activity . Advanced directives . List of other physicians . Hospitalizations, surgeries, and ER visits in previous 12 months . Vitals . Screenings to include cognitive, depression, and falls . Referrals and appointments  In addition, I have reviewed and discussed with patient certain preventive protocols, quality metrics, and best practice recommendations. A written personalized care plan for preventive services as well as general preventive health recommendations were provided to  patient.     Andrez Grime, LPN  [id number]/[id number]

## 2019-03-06 NOTE — Patient Instructions (Addendum)
Darryl White , Thank you for taking time to come for your Medicare Wellness Visit. I appreciate your ongoing commitment to your health goals. Please review the following plan we discussed and let me know if I can assist you in the future.   Screening recommendations/referrals: Colonoscopy: Up to date, completed 07/06/2015 Recommended yearly ophthalmology/optometry visit for glaucoma screening and checkup Recommended yearly dental visit for hygiene and checkup  Vaccinations: Influenza vaccine: Up to date, completed 10/26/2018 Pneumococcal vaccine: Completed series Tdap vaccine: Up to date, completed 05/04/2016 Shingles vaccine: Completed series  Advanced directives: Advance directive discussed with you today. I have provided a copy for you to complete at home and have notarized. Once this is complete please bring a copy in to our office so we can scan it into your chart.  Conditions/risks identified: hyperlipidemia  Next appointment: 03/13/2019 @ 7:40 am   Preventive Care 65 Years and Older, Male Preventive care refers to lifestyle choices and visits with your health care provider that can promote health and wellness. What does preventive care include?  A yearly physical exam. This is also called an annual well check.  Dental exams once or twice a year.  Routine eye exams. Ask your health care provider how often you should have your eyes checked.  Personal lifestyle choices, including:  Daily care of your teeth and gums.  Regular physical activity.  Eating a healthy diet.  Avoiding tobacco and drug use.  Limiting alcohol use.  Practicing safe sex.  Taking low doses of aspirin every day.  Taking vitamin and mineral supplements as recommended by your health care provider. What happens during an annual well check? The services and screenings done by your health care provider during your annual well check will depend on your age, overall health, lifestyle risk factors, and family  history of disease. Counseling  Your health care provider may ask you questions about your:  Alcohol use.  Tobacco use.  Drug use.  Emotional well-being.  Home and relationship well-being.  Sexual activity.  Eating habits.  History of falls.  Memory and ability to understand (cognition).  Work and work Statistician. Screening  You may have the following tests or measurements:  Height, weight, and BMI.  Blood pressure.  Lipid and cholesterol levels. These may be checked every 5 years, or more frequently if you are over 22 years old.  Skin check.  Lung cancer screening. You may have this screening every year starting at age 48 if you have a 30-pack-year history of smoking and currently smoke or have quit within the past 15 years.  Fecal occult blood test (FOBT) of the stool. You may have this test every year starting at age 53.  Flexible sigmoidoscopy or colonoscopy. You may have a sigmoidoscopy every 5 years or a colonoscopy every 10 years starting at age 101.  Prostate cancer screening. Recommendations will vary depending on your family history and other risks.  Hepatitis C blood test.  Hepatitis B blood test.  Sexually transmitted disease (STD) testing.  Diabetes screening. This is done by checking your blood sugar (glucose) after you have not eaten for a while (fasting). You may have this done every 1-3 years.  Abdominal aortic aneurysm (AAA) screening. You may need this if you are a current or former smoker.  Osteoporosis. You may be screened starting at age 67 if you are at high risk. Talk with your health care provider about your test results, treatment options, and if necessary, the need for more tests. Vaccines  Your health care provider may recommend certain vaccines, such as:  Influenza vaccine. This is recommended every year.  Tetanus, diphtheria, and acellular pertussis (Tdap, Td) vaccine. You may need a Td booster every 10 years.  Zoster vaccine.  You may need this after age 14.  Pneumococcal 13-valent conjugate (PCV13) vaccine. One dose is recommended after age 1.  Pneumococcal polysaccharide (PPSV23) vaccine. One dose is recommended after age 44. Talk to your health care provider about which screenings and vaccines you need and how often you need them. This information is not intended to replace advice given to you by your health care provider. Make sure you discuss any questions you have with your health care provider. Document Released: 03/05/2015 Document Revised: 10/27/2015 Document Reviewed: 12/08/2014 Elsevier Interactive Patient Education  2017 [place] Prevention in the Home Falls can cause injuries. They can happen to people of all ages. There are many things you can do to make your home safe and to help prevent falls. What can I do on the outside of my home?  Regularly fix the edges of walkways and driveways and fix any cracks.  Remove anything that might make you trip as you walk through a door, such as a raised step or threshold.  Trim any bushes or trees on the path to your home.  Use bright outdoor lighting.  Clear any walking paths of anything that might make someone trip, such as rocks or tools.  Regularly check to see if handrails are loose or broken. Make sure that both sides of any steps have handrails.  Any raised decks and porches should have guardrails on the edges.  Have any leaves, snow, or ice cleared regularly.  Use sand or salt on walking paths during winter.  Clean up any spills in your garage right away. This includes oil or grease spills. What can I do in the bathroom?  Use night lights.  Install grab bars by the toilet and in the tub and shower. Do not use towel bars as grab bars.  Use non-skid mats or decals in the tub or shower.  If you need to sit down in the shower, use a plastic, non-slip stool.  Keep the floor dry. Clean up any water that spills on the floor as soon  as it happens.  Remove soap buildup in the tub or shower regularly.  Attach bath mats securely with double-sided non-slip rug tape.  Do not have throw rugs and other things on the floor that can make you trip. What can I do in the bedroom?  Use night lights.  Make sure that you have a light by your bed that is easy to reach.  Do not use any sheets or blankets that are too big for your bed. They should not hang down onto the floor.  Have a firm chair that has side arms. You can use this for support while you get dressed.  Do not have throw rugs and other things on the floor that can make you trip. What can I do in the kitchen?  Clean up any spills right away.  Avoid walking on wet floors.  Keep items that you use a lot in easy-to-reach places.  If you need to reach something above you, use a strong step stool that has a grab bar.  Keep electrical cords out of the way.  Do not use floor polish or wax that makes floors slippery. If you must use wax, use non-skid floor wax.  Do  not have throw rugs and other things on the floor that can make you trip. What can I do with my stairs?  Do not leave any items on the stairs.  Make sure that there are handrails on both sides of the stairs and use them. Fix handrails that are broken or loose. Make sure that handrails are as long as the stairways.  Check any carpeting to make sure that it is firmly attached to the stairs. Fix any carpet that is loose or worn.  Avoid having throw rugs at the top or bottom of the stairs. If you do have throw rugs, attach them to the floor with carpet tape.  Make sure that you have a light switch at the top of the stairs and the bottom of the stairs. If you do not have them, ask someone to add them for you. What else can I do to help prevent falls?  Wear shoes that:  Do not have high heels.  Have rubber bottoms.  Are comfortable and fit you well.  Are closed at the toe. Do not wear sandals.  If  you use a stepladder:  Make sure that it is fully opened. Do not climb a closed stepladder.  Make sure that both sides of the stepladder are locked into place.  Ask someone to hold it for you, if possible.  Clearly mark and make sure that you can see:  Any grab bars or handrails.  First and last steps.  Where the edge of each step is.  Use tools that help you move around (mobility aids) if they are needed. These include:  Canes.  Walkers.  Scooters.  Crutches.  Turn on the lights when you go into a dark area. Replace any light bulbs as soon as they burn out.  Set up your furniture so you have a clear path. Avoid moving your furniture around.  If any of your floors are uneven, fix them.  If there are any pets around you, be aware of where they are.  Review your medicines with your doctor. Some medicines can make you feel dizzy. This can increase your chance of falling. Ask your doctor what other things that you can do to help prevent falls. This information is not intended to replace advice given to you by your health care provider. Make sure you discuss any questions you have with your health care provider. Document Released: 12/03/2008 Document Revised: 07/15/2015 Document Reviewed: 03/13/2014 Elsevier Interactive Patient Education  2017 [employer].

## 2019-03-10 ENCOUNTER — Other Ambulatory Visit: Payer: Self-pay

## 2019-03-11 ENCOUNTER — Encounter: Payer: Self-pay | Admitting: Radiation Oncology

## 2019-03-11 ENCOUNTER — Other Ambulatory Visit: Payer: Self-pay

## 2019-03-11 ENCOUNTER — Ambulatory Visit
Admission: RE | Admit: 2019-03-11 | Discharge: 2019-03-11 | Disposition: A | Discharge status: Home / Self Care | Payer: Medicare PPO | Source: Ambulatory Visit | Attending: Radiation Oncology | Admitting: Radiation Oncology

## 2019-03-11 ENCOUNTER — Other Ambulatory Visit: Payer: Self-pay | Admitting: *Deleted

## 2019-03-11 VITALS — BP 131/90 | HR 82 | Temp 97.4°F | Resp 22 | Wt 173.1 lb

## 2019-03-11 DIAGNOSIS — R918 Other nonspecific abnormal finding of lung field: Secondary | ICD-10-CM | POA: Diagnosis not present

## 2019-03-11 DIAGNOSIS — C3431 Malignant neoplasm of lower lobe, right bronchus or lung: Secondary | ICD-10-CM | POA: Insufficient documentation

## 2019-03-11 DIAGNOSIS — C3490 Malignant neoplasm of unspecified part of unspecified bronchus or lung: Secondary | ICD-10-CM

## 2019-03-11 NOTE — Progress Notes (Signed)
Radiation Oncology Follow up Note  Name: Darryl White   Date:   03/11/2019 MRN:  [id number] DOB: 1940/06/11    This 79 y.o. 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  REFERRING PROVIDER: Pleas Koch, NP  HPI: Patient is a 79 year old male last seen at 8 months having completed SBRT to his right lower lobe for a non-small cell lung cancer.  Seen today in routine follow-up he is doing well has a minor nonproductive cough no dysphagia or chest tightness.Marland Kitchen  Unfortunately recent CT scan showed marked progression of precarinal lymphadenopathy with central low attenuation suggesting necrosis.  This is highly suspicious for metastatic disease.  Also has a continued progression of the peripheral right upper lobe pulmonary nodule slightly increased in size.  COMPLICATIONS OF TREATMENT: none  FOLLOW UP COMPLIANCE: keeps appointments   PHYSICAL EXAM:  BP 131/90 (BP Location: Left Arm, Patient Position: Sitting)   Pulse 82   Temp (!) 97.4 F (36.3 C) (Tympanic)   Resp (!) 22   Wt 173 lb 1.6 oz (78.5 kg)   BMI 27.94 kg/m  Well-developed well-nourished patient in NAD. HEENT reveals PERLA, EOMI, discs not visualized.  Oral cavity is clear. No oral mucosal lesions are identified. Neck is clear without evidence of cervical or supraclavicular adenopathy. Lungs are clear to A&P. Cardiac examination is essentially unremarkable with regular rate and rhythm without murmur rub or thrill. Abdomen is benign with no organomegaly or masses noted. Motor sensory and DTR levels are equal and symmetric in the upper and lower extremities. Cranial nerves II through XII are grossly intact. Proprioception is intact. No peripheral adenopathy or edema is identified. No motor or sensory levels are noted. Crude visual fields are within normal range.  RADIOLOGY RESULTS: CT scan reviewed compatible with above-stated findings PET CT scan has  been ordered  PLAN: At this time I ordered a PET CT scan for better delineation of exactly what is possible active progressive disease.  I am also referring him to medical oncology for opinion.  Patient comprehends my treatment plan and recommendations well we will see the patient in follow-up the same day he sees medical oncology.  I would like to take this opportunity to thank you for allowing me to participate in the care of your patient.Noreene Filbert, MD

## 2019-03-13 ENCOUNTER — Ambulatory Visit (INDEPENDENT_AMBULATORY_CARE_PROVIDER_SITE_OTHER): Payer: Medicare PPO | Admitting: Primary Care

## 2019-03-13 ENCOUNTER — Other Ambulatory Visit: Payer: Self-pay

## 2019-03-13 ENCOUNTER — Telehealth: Payer: Self-pay | Admitting: *Deleted

## 2019-03-13 ENCOUNTER — Encounter: Payer: Self-pay | Admitting: Primary Care

## 2019-03-13 VITALS — BP 130/80 | HR 84 | Temp 96.1°F | Ht 66.0 in | Wt 172.2 lb

## 2019-03-13 DIAGNOSIS — R918 Other nonspecific abnormal finding of lung field: Secondary | ICD-10-CM | POA: Diagnosis not present

## 2019-03-13 DIAGNOSIS — Z0000 Encounter for general adult medical examination without abnormal findings: Secondary | ICD-10-CM

## 2019-03-13 DIAGNOSIS — J441 Chronic obstructive pulmonary disease with (acute) exacerbation: Secondary | ICD-10-CM

## 2019-03-13 DIAGNOSIS — E781 Pure hyperglyceridemia: Secondary | ICD-10-CM | POA: Diagnosis not present

## 2019-03-13 DIAGNOSIS — I480 Paroxysmal atrial fibrillation: Secondary | ICD-10-CM | POA: Diagnosis not present

## 2019-03-13 DIAGNOSIS — Z86010 Personal history of colonic polyps: Secondary | ICD-10-CM

## 2019-03-13 DIAGNOSIS — Z950 Presence of cardiac pacemaker: Secondary | ICD-10-CM

## 2019-03-13 DIAGNOSIS — K219 Gastro-esophageal reflux disease without esophagitis: Secondary | ICD-10-CM | POA: Insufficient documentation

## 2019-03-13 DIAGNOSIS — M05749 Rheumatoid arthritis with rheumatoid factor of unspecified hand without organ or systems involvement: Secondary | ICD-10-CM

## 2019-03-13 LAB — CBC
HCT: 43.7 % (ref 39.0–52.0)
Hemoglobin: 14.7 g/dL (ref 13.0–17.0)
MCHC: 33.6 g/dL (ref 30.0–36.0)
MCV: 106.6 fl — ABNORMAL HIGH (ref 78.0–100.0)
Platelets: 186 10*3/uL (ref 150.0–400.0)
RBC: 4.1 Mil/uL — ABNORMAL LOW (ref 4.22–5.81)
RDW: 15.6 % — ABNORMAL HIGH (ref 11.5–15.5)
WBC: 6.2 10*3/uL (ref 4.0–10.5)

## 2019-03-13 LAB — COMPREHENSIVE METABOLIC PANEL
ALT: 33 U/L (ref 0–53)
AST: 53 U/L — ABNORMAL HIGH (ref 0–37)
Albumin: 4.2 g/dL (ref 3.5–5.2)
Alkaline Phosphatase: 71 U/L (ref 39–117)
BUN: 18 mg/dL (ref 6–23)
CO2: 26 mEq/L (ref 19–32)
Calcium: 9.4 mg/dL (ref 8.4–10.5)
Chloride: 102 mEq/L (ref 96–112)
Creatinine, Ser: 1.18 mg/dL (ref 0.40–1.50)
GFR: 59.67 mL/min — ABNORMAL LOW (ref >60.00)
Glucose, Bld: 105 mg/dL — ABNORMAL HIGH (ref 70–99)
Potassium: 4.6 mEq/L (ref 3.5–5.1)
Sodium: 134 mEq/L — ABNORMAL LOW (ref 135–145)
Total Bilirubin: 0.9 mg/dL (ref 0.2–1.2)
Total Protein: 6.9 g/dL (ref 6.0–8.3)

## 2019-03-13 LAB — LIPID PANEL
Cholesterol: 154 mg/dL (ref 0–200)
HDL: 50.9 mg/dL (ref >39.00)
LDL Cholesterol: 86 mg/dL (ref 0–99)
NonHDL: 103.56
Total CHOL/HDL Ratio: 3
Triglycerides: 89 mg/dL (ref 0.0–149.0)
VLDL: 17.8 mg/dL (ref 0.0–40.0)

## 2019-03-13 NOTE — Assessment & Plan Note (Signed)
Compliant to fenofibrate, repeat lipids pending.

## 2019-03-13 NOTE — Assessment & Plan Note (Signed)
New pacemaker insertion in late 2020, follow up with EP. Due for follow up next week.

## 2019-03-13 NOTE — Progress Notes (Signed)
Subjective:    Patient ID: Darryl White, male    DOB: 09-19-1940, 79 y.o.   MRN: [id number]  HPI  This visit occurred during the SARS-CoV-2 public health emergency.  Safety protocols were in place, including screening questions prior to the visit, additional usage of staff PPE, and extensive cleaning of exam room while observing appropriate contact time as indicated for disinfecting solutions.   Darryl White is a 79 year old male who presents today for complete physical.  Immunizations: -Tetanus: Completed in 2018 -Influenza: Completed this season  -Shingles: Completed Zostavax and Shingrix -Pneumonia: Completed Prevnar in 2015 and Pneumovax in 2017  Diet: He endorses a fair diet. Exercise: He is not exercising  Eye exam: Completes regularly  Dental exam: Completes regularly   Colonoscopy: Completed in 2017, due in 2022 PSA: Completed last in 2018, level of 0.48  BP Readings from Last 3 Encounters:  03/13/19 130/80  03/11/19 131/90  01/06/19 130/70   Wt Readings from Last 3 Encounters:  03/13/19 172 lb 4 oz (78.1 kg)  03/11/19 173 lb 1.6 oz (78.5 kg)  01/06/19 172 lb 12.8 oz (78.4 kg)     Review of Systems  Constitutional: Negative for unexpected weight change.  HENT: Negative for rhinorrhea.   Respiratory: Positive for shortness of breath. Negative for cough.   Cardiovascular: Negative for chest pain.  Gastrointestinal: Negative for constipation and diarrhea.       Esophageal burning with meals  Genitourinary: Negative for difficulty urinating.  Musculoskeletal: Positive for arthralgias.       Overall improved with gabapentin and methotrexate   Skin: Negative for rash.  Allergic/Immunologic: Negative for environmental allergies.  Neurological: Positive for numbness. Negative for dizziness and headaches.  Psychiatric/Behavioral: The patient is not nervous/anxious.        Past Medical History:  Diagnosis Date  . Arthritis 2019   dx of osteoarthritis and  rhemautoid arthritis  . Cancer ([place])   . Cataract 2019   left eye  . Chicken pox   . Collagen vascular disease ([place])   . 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 Diverticulitis   . 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 History of stomach ulcers   . Hyperlipidemia   . Lymphocytic colitis   . Orthostatic hypotension   . Pancreatitis   . Paroxysmal atrial fibrillation ([facility])      Social History   Socioeconomic History  . Marital status: Married    Spouse name: Not on file  . Number of children: 2  . Years of education: Not on file  . Highest education level: Not on file  Occupational History  . Occupation: Retired  Tobacco Use  . Smoking status: Former Smoker    Packs/day: 1.00    Years: 57.00    Pack years: 57.00    Quit date: 04/29/2017    Years since quitting: 1.8  . Smokeless tobacco: Never Used  Substance and Sexual Activity  . Alcohol use: Yes    Alcohol/week: 5.0 standard drinks    Types: 5 Glasses of wine per week  . Drug use: No  . Sexual activity: Never  Other Topics Concern  . Not on file  Social History Narrative   Married.   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      Does not have a living will.   Desires CPR, does not want prolonged life support if futile.   Social Determinants of Health  Financial Resource Strain: Low Risk   . Difficulty of Paying Living Expenses: Not hard at all  Food Insecurity: No Food Insecurity  . Worried About Charity fundraiser in the Last Year: Never true  . Ran Out of Food in the Last Year: Never true  Transportation Needs: No Transportation Needs  . Lack of Transportation (Medical): No  . Lack of Transportation (Non-Medical): No  Physical Activity: Sufficiently Active  . Days of Exercise per Week: 7 days  . Minutes of Exercise per Session: 60 min  Stress: No Stress Concern Present  . Feeling of Stress : Not at all  Social Connections:   . Frequency of Communication with Friends and  Family: Not on file  . Frequency of Social Gatherings with Friends and Family: Not on file  . Attends Religious Services: Not on file  . Active Member of Clubs or Organizations: Not on file  . Attends Archivist Meetings: Not on file  . Marital Status: Not on file  Intimate Partner Violence: Not At Risk  . Fear of Current or Ex-Partner: No  . Emotionally Abused: No  . Physically Abused: No  . Sexually Abused: No    Past Surgical History:  Procedure Laterality Date  . APPENDECTOMY    . BACK SURGERY    . large intestine block    . PACEMAKER INSERTION     MDT dual chamber pacemaker  . PPM GENERATOR CHANGEOUT N/A 12/02/2018   Procedure: PPM GENERATOR CHANGEOUT;  Surgeon: Deboraha Sprang, MD;  Location: [facility];  Service: Cardiovascular;  Laterality: N/A;  . TONSILLECTOMY AND ADENOIDECTOMY      Family History  Problem Relation Age of Onset  . Cancer Mother   . Heart disease Father     Allergies  Allergen Reactions  . Sulfa Antibiotics Hives and Itching  . Penicillins Hives and Palpitations    Did it involve swelling of the face/tongue/throat, SOB, or low BP? No Did it involve sudden or severe rash/hives, skin peeling, or any reaction on the inside of your mouth or nose? No Did you need to seek medical attention at a hospital or doctor's office? Unknown When did it last happen?childhood allergy If all above answers are "NO", may proceed with cephalosporin use.     Current Outpatient Medications on File Prior to Visit  Medication Sig Dispense Refill  . albuterol (PROAIR HFA) 108 (90 Base) MCG/ACT inhaler Inhale 1-2 puffs into the lungs every 6 (six) hours as needed for wheezing or shortness of breath. 1 Inhaler 5  . brimonidine (ALPHAGAN) 0.15 % ophthalmic solution Place 1 drop into both eyes 2 (two) times daily.     . budesonide-formoterol (SYMBICORT) 80-4.5 MCG/ACT inhaler Inhale 2 puffs into the lungs daily. 1 Inhaler 12  . Cholecalciferol  (VITAMIN D3) 2000 UNITS capsule Take 2,000 Units by mouth daily.    Marland Kitchen dextromethorphan-guaiFENesin (MUCINEX DM) 30-600 MG 12hr tablet Take 1 tablet by mouth 2 (two) times daily.    Marland Kitchen ELIQUIS 5 MG TABS tablet TAKE 1 TABLET BY MOUTH TWICE DAILY (Patient taking differently: Take 5 mg by mouth 2 (two) times daily. ) 60 tablet 6  . famotidine (PEPCID) 20 MG tablet Take 20 mg by mouth 2 (two) times daily.    . fenofibrate (TRICOR) 145 MG tablet Take 1 tablet (145 mg total) by mouth daily. 90 tablet 1  . folic acid (FOLVITE) 1 MG tablet Take 1 mg by mouth daily.    Marland Kitchen gabapentin (NEURONTIN)  100 MG capsule Take 100 mg by mouth 3 (three) times daily.    . methotrexate 2.5 MG tablet Take 12.5 mg by mouth every Sunday.     . sodium chloride (OCEAN) 0.65 % SOLN nasal spray Place 1 spray into both nostrils as needed for congestion.    Marland Kitchen SPIRIVA HANDIHALER 18 MCG inhalation capsule PLACE 1 CAPSULE INTO INHALER AND INHALE DAILY 30 capsule 2  . traMADol (ULTRAM) 50 MG tablet Take 25-50 mg by mouth every 12 (twelve) hours as needed for moderate pain.      No current facility-administered medications on file prior to visit.    BP 130/80   Pulse 84   Temp (!) 96.1 F (35.6 C) (Temporal)   Ht 5\' 6"  (1.676 m)   Wt 172 lb 4 oz (78.1 kg)   SpO2 95%   BMI 27.80 kg/m    Objective:   Physical Exam  Constitutional: He is oriented to person, place, and time. He appears well-nourished.  HENT:  Right Ear: Tympanic membrane and ear canal normal.  Left Ear: Tympanic membrane and ear canal normal.  Mouth/Throat: Oropharynx is clear and moist.  Eyes: Pupils are equal, round, and reactive to light. EOM are normal.  Cardiovascular: Normal rate and regular rhythm.  Respiratory: Effort normal and breath sounds normal.  GI: Soft. Bowel sounds are normal. There is no abdominal tenderness.  Musculoskeletal:        General: Normal range of motion.     Cervical back: Neck supple.  Neurological: He is alert and oriented  to person, place, and time. No cranial nerve deficit.  Reflex Scores:      Patellar reflexes are 2+ on the right side and 2+ on the left side. Skin: Skin is warm and dry.  Psychiatric: He has a normal mood and affect.           Assessment & Plan:

## 2019-03-13 NOTE — Assessment & Plan Note (Signed)
Completed in 2017, based off of pathology notes recall in 2022.

## 2019-03-13 NOTE — Patient Instructions (Signed)
Stop by the lab prior to leaving today. I will notify you of your results once received.   Start exercising. You should be getting 150 minutes of exercise weekly.  Continue to work on a healthy diet. Be sure to drink plenty of water daily.  It was a pleasure to see you today!   Preventive Care 79 Years and Older, Male Preventive care refers to lifestyle choices and visits with your health care provider that can promote health and wellness. This includes:  A yearly physical exam. This is also called an annual well check.  Regular dental and eye exams.  Immunizations.  Screening for certain conditions.  Healthy lifestyle choices, such as diet and exercise. What can I expect for my preventive care visit? Physical exam Your health care provider will check:  Height and weight. These may be used to calculate body mass index (BMI), which is a measurement that tells if you are at a healthy weight.  Heart rate and blood pressure.  Your skin for abnormal spots. Counseling Your health care provider may ask you questions about:  Alcohol, tobacco, and drug use.  Emotional well-being.  Home and relationship well-being.  Sexual activity.  Eating habits.  History of falls.  Memory and ability to understand (cognition).  Work and work Statistician. What immunizations do I need?  Influenza (flu) vaccine  This is recommended every year. Tetanus, diphtheria, and pertussis (Tdap) vaccine  You may need a Td booster every 10 years. Varicella (chickenpox) vaccine  You may need this vaccine if you have not already been vaccinated. Zoster (shingles) vaccine  You may need this after age 55. Pneumococcal conjugate (PCV13) vaccine  One dose is recommended after age 47. Pneumococcal polysaccharide (PPSV23) vaccine  One dose is recommended after age 16. Measles, mumps, and rubella (MMR) vaccine  You may need at least one dose of MMR if you were born in 1957 or later. You may  also need a second dose. Meningococcal conjugate (MenACWY) vaccine  You may need this if you have certain conditions. Hepatitis A vaccine  You may need this if you have certain conditions or if you travel or work in places where you may be exposed to hepatitis A. Hepatitis B vaccine  You may need this if you have certain conditions or if you travel or work in places where you may be exposed to hepatitis B. Haemophilus influenzae type b (Hib) vaccine  You may need this if you have certain conditions. You may receive vaccines as individual doses or as more than one vaccine together in one shot (combination vaccines). Talk with your health care provider about the risks and benefits of combination vaccines. What tests do I need? Blood tests  Lipid and cholesterol levels. These may be checked every 5 years, or more frequently depending on your overall health.  Hepatitis C test.  Hepatitis B test. Screening  Lung cancer screening. You may have this screening every year starting at age 79 if you have a 30-pack-year history of smoking and currently smoke or have quit within the past 15 years.  Colorectal cancer screening. All adults should have this screening starting at age 79 and continuing until age 30. Your health care provider may recommend screening at age 18 if you are at increased risk. You will have tests every 1-10 years, depending on your results and the type of screening test.  Prostate cancer screening. Recommendations will vary depending on your family history and other risks.  Diabetes screening. This is done  by checking your blood sugar (glucose) after you have not eaten for a while (fasting). You may have this done every 1-3 years.  Abdominal aortic aneurysm (AAA) screening. You may need this if you are a current or former smoker.  Sexually transmitted disease (STD) testing. Follow these instructions at home: Eating and drinking  Eat a diet that includes fresh fruits  and vegetables, whole grains, lean protein, and low-fat dairy products. Limit your intake of foods with high amounts of sugar, saturated fats, and salt.  Take vitamin and mineral supplements as recommended by your health care provider.  Do not drink alcohol if your health care provider tells you not to drink.  If you drink alcohol: ? Limit how much you have to 0-2 drinks a day. ? Be aware of how much alcohol is in your drink. In the [place] one drink equals one 12 oz bottle of beer (355 mL), one 5 oz glass of wine (148 mL), or one 1 oz glass of hard liquor (44 mL). Lifestyle  Take daily care of your teeth and gums.  Stay active. Exercise for at least 30 minutes on 5 or more days each week.  Do not use any products that contain nicotine or tobacco, such as cigarettes, e-cigarettes, and chewing tobacco. If you need help quitting, ask your health care provider.  If you are sexually active, practice safe sex. Use a condom or other form of protection to prevent STIs (sexually transmitted infections).  Talk with your health care provider about taking a low-dose aspirin or statin. What's next?  Visit your health care provider once a year for a well check visit.  Ask your health care provider how often you should have your eyes and teeth checked.  Stay up to date on all vaccines. This information is not intended to replace advice given to you by your health care provider. Make sure you discuss any questions you have with your health care provider. Document Revised: 01/31/2018 Document Reviewed: 01/31/2018 Elsevier Patient Education  2020 [employer].

## 2019-03-13 NOTE — Assessment & Plan Note (Signed)
Compliant to Eliquis, rate stable. Continue current regimen.

## 2019-03-13 NOTE — Assessment & Plan Note (Signed)
Immunizations UTD. PSA pending. Colonoscopy UTD. Encouraged regular exercise, healthy diet. Exam today stable. Labs pending.

## 2019-03-13 NOTE — Assessment & Plan Note (Signed)
Following with rheumatology through [facility], continue current regimen.

## 2019-03-13 NOTE — Assessment & Plan Note (Signed)
Following with radiology oncology, will be following with oncology soon. Recent CT chest reviewed, to under go PET scan soon.

## 2019-03-13 NOTE — Telephone Encounter (Signed)
Appointments given for PET scan, Dr. Baruch Gouty and Dr. Tasia Catchings.

## 2019-03-13 NOTE — Assessment & Plan Note (Signed)
Doing well on Symbicort and Spiriva, using albuterol as needed. Continue current regimen.

## 2019-03-13 NOTE — Assessment & Plan Note (Signed)
Breakthrough symptoms on once daily famotidine. Discussed to increase to 20 mg BID and update.

## 2019-03-18 ENCOUNTER — Other Ambulatory Visit: Payer: Self-pay

## 2019-03-18 ENCOUNTER — Encounter: Payer: Self-pay | Admitting: Pulmonary Disease

## 2019-03-18 ENCOUNTER — Ambulatory Visit: Payer: Medicare PPO | Admitting: Pulmonary Disease

## 2019-03-18 ENCOUNTER — Encounter: Payer: Self-pay | Admitting: Internal Medicine

## 2019-03-18 ENCOUNTER — Ambulatory Visit (INDEPENDENT_AMBULATORY_CARE_PROVIDER_SITE_OTHER): Payer: Medicare PPO | Admitting: Internal Medicine

## 2019-03-18 VITALS — BP 100/70 | HR 80 | Ht 66.0 in | Wt 171.8 lb

## 2019-03-18 DIAGNOSIS — Z77090 Contact with and (suspected) exposure to asbestos: Secondary | ICD-10-CM | POA: Diagnosis not present

## 2019-03-18 DIAGNOSIS — J84112 Idiopathic pulmonary fibrosis: Secondary | ICD-10-CM

## 2019-03-18 DIAGNOSIS — I480 Paroxysmal atrial fibrillation: Secondary | ICD-10-CM

## 2019-03-18 DIAGNOSIS — J449 Chronic obstructive pulmonary disease, unspecified: Secondary | ICD-10-CM | POA: Diagnosis not present

## 2019-03-18 DIAGNOSIS — R918 Other nonspecific abnormal finding of lung field: Secondary | ICD-10-CM | POA: Diagnosis not present

## 2019-03-18 DIAGNOSIS — M0579 Rheumatoid arthritis with rheumatoid factor of multiple sites without organ or systems involvement: Secondary | ICD-10-CM

## 2019-03-18 DIAGNOSIS — R590 Localized enlarged lymph nodes: Secondary | ICD-10-CM

## 2019-03-18 DIAGNOSIS — Z950 Presence of cardiac pacemaker: Secondary | ICD-10-CM

## 2019-03-18 DIAGNOSIS — I495 Sick sinus syndrome: Secondary | ICD-10-CM | POA: Diagnosis not present

## 2019-03-18 MED ORDER — BREZTRI AEROSPHERE 160-9-4.8 MCG/ACT IN AERO: 2.0000 | INHALATION_SPRAY | Freq: Two times a day (BID) | RESPIRATORY_TRACT | 0 refills | Status: AC

## 2019-03-18 NOTE — Patient Instructions (Signed)
Medication Instructions:  - Your physician recommends that you continue on your current medications as directed. Please refer to the Current Medication list given to you today.  *If you need a refill on your cardiac medications before your next appointment, please call your pharmacy*  Lab Work: - none ordered  If you have labs (blood work) drawn today and your tests are completely normal, you will receive your results only by: . MyChart Message (if you have MyChart) OR . A paper copy in the mail If you have any lab test that is abnormal or we need to change your treatment, we will call you to review the results.  Testing/Procedures: - none ordered  Follow-Up: At [facility], you and your health needs are our priority.  As part of our continuing mission to provide you with exceptional heart care, we have created designated Provider Care Teams.  These Care Teams include your primary Cardiologist (physician) and Advanced Practice Providers (APPs -  Physician Assistants and Nurse Practitioners) who all work together to provide you with the care you need, when you need it.  Your next appointment:   1 year(s)  The format for your next appointment:   In Person  Provider:   Steven Klein, MD  Other Instructions - n/a  

## 2019-03-18 NOTE — Patient Instructions (Signed)
We will follow-up on the spot seen on the right lung with the PET/CT that is already been ordered.  We are switching your Symbicort and Spiriva to a medication called [name] this is 2 puffs twice a day.  You are being provided samples please let us know how you are doing with this medication so that we can call it into your pharmacy.  If you are not doing well with it we can try an alternative.  Do not take Symbicort and Spiriva while you are on the [place].  We will see you in follow-up in 3 months time call sooner should any new difficulties arise.

## 2019-03-18 NOTE — Progress Notes (Signed)
Subjective:    Patient ID: Darryl White, male    DOB: 1941-01-23, 79 y.o.   MRN: [id number]  HPI The patient is a 79 year old former smoker who previously followed with Dr. Ashby Dawes and I first saw on 06 January 2019.  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  During his initial visit with me we placed him on Symbicort to complement his Spiriva.  He has noted that his dyspnea is somewhat better with this combination however it is costly so we will have to make adjustments for him today.  We also reviewed recent CT scan of the chest performed on 7 January.  The peripheral right upper lobe nodule showed progression in size.  Additionally of precarinal lymph node that previously was not enlarged, has now enlarged and shows some central necrosis that is highly suspicious for malignancy.  The patient is scheduled for a PET/CT in 2 days.  Given the necrosis noted on the CT scan of the chest this will be useful to better target potential biopsies.  The patient voices no other new complaint.  He is totally asymptomatic with regards to his findings in the chest CT.  He has had no anorexia or weight loss.  No B symptoms.  Respiratory symptoms are stable mainly dyspnea on exertion which has improved somewhat with a combination of Symbicort and Spiriva and decreased "mucus" production.  He voices no other complaint.  Review of Systems A 10 point review of systems was performed and it is as noted above otherwise negative.    Objective:   Physical Exam BP 100/70 (BP Location: Left Arm, Patient Position: Sitting, Cuff Size: Normal)   Pulse 80   Ht 5\' 6"  (1.676 m)   Wt 171 lb 12.8 oz (77.9 kg)   SpO2 95% Comment: on ra  BMI 27.73 kg/m  GENERAL: Awake alert elderly male, conversant, no  distress, fully ambulatory HEAD: Normocephalic, atraumatic.  EYES: Pupils equal, round, reactive to light.  No scleral icterus.  MOUTH: Nose/mouth/throat not examined due to masking requirements for COVID 19. NECK: Supple. No thyromegaly. No nodules. No JVD.  Trachea midline, no lymphadenopathy. PULMONARY: Very distant lung sounds with no adventitious sounds. CARDIOVASCULAR: S1 and S2. Regular rate and rhythm.  No murmur appreciated.  Pacemaker in place. GASTROINTESTINAL: Nondistended abdomen. MUSCULOSKELETAL: No joint swelling, no clubbing, no edema.  NEUROLOGIC: No focal deficit.  Fluent speech. SKIN: Intact,warm,dry.  Limited exam no rashes noted. PSYCH: Mood and behavior normal.  Representative slices from 7 January CT scan of the chest:        Assessment & Plan:   COPD with chronic bronchitis/emphysema mix He has issues procuring both Symbicort and Spiriva Repeat replace both inhalers with Breztri 2 puffs twice a day Samples and instruction provided to the patient Prescription to be sent to pharmacy if efficacious  Right upper lobe pleural-based mass Mediastinal adenopathy, precarinal Malignancy until proven otherwise Mediastinal adenopathy with central necrosis amenable to diagnosis by EBUS Right upper lobe pleural-based mass not amenable to biopsy by bronchoscopy Patient to have PET/CT in 2 days PET/CT will help in selecting site for biopsy, follow-up after PET/CT performed  Pulmonary fibrosis Hx: Rheumatoid arthritis, asbestos exposure, 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  Renold Don, MD [facility]  *This note was dictated using voice recognition software/Dragon.  Despite best efforts to proofread, errors can occur which can change the meaning.  Any change was purely unintentional.

## 2019-03-18 NOTE — Progress Notes (Signed)
Patient Care Team: Pleas Koch, NP as PCP - General (Internal Medicine) Deboraha Sprang, MD as Consulting Physician (Cardiology) Marlowe Sax, MD as Referring Physician (Internal Medicine) Birder Robson, MD as Referring Physician (Ophthalmology) Oneta Rack, MD as Referring Physician (Dermatology) Salli Real, DDS as Referring Physician (Dentistry)   HPI  Darryl White is a 79 y.o. male Seen in followup for PM implanted 1994 at [facility] for syncope assoc with OI.   Generator replacement 2008 and  10/20  Healed without difficulty    AFib>> [facility] 8/16  . He has had no recurrent syncope, presumably neurally mediated by the family's description   The patient denies chest pain, nocturnal dyspnea, orthopne or peripheral edema.  There have been no palpitations, lightheadedness or syncope.  Chronic SOB followed by pulmonary      On Anticoagulation w apixoban without bleeding  Date Cr K Hgb  6/18 1.1  14.9  1/19  1.14  15.2  4/20 1.37  13.7  1/21 1.18 4.6 59.5   Thromboembolic risk factors ( age  -2) for a CHADSVASc Score of 2    DATE TEST EF   8/16 Myoview  65 % No ischemia  9/20 Echo   55-60 %           Lung Ca s/p radiation Therapy now w another lesion noted >>  CT 1/21  "Marked progression of a precarinal lymph node showing central low attenuation suggesting necrosis. Progression highly suspicious for metastatic disease."  Note a PET scan is pending  Past Medical History:  Diagnosis Date  . Arthritis 2019   dx of osteoarthritis and rhemautoid arthritis  . Cancer ([place])   . Cataract 2019   left eye  . Chicken pox   . Collagen vascular disease ([place])   . 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 Diverticulitis   . 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 History of stomach ulcers   . Hyperlipidemia   . Lymphocytic colitis   . Orthostatic hypotension   . Pancreatitis   . Paroxysmal atrial fibrillation [facility])     Past  Surgical History:  Procedure Laterality Date  . APPENDECTOMY    . BACK SURGERY    . large intestine block    . PACEMAKER INSERTION     MDT dual chamber pacemaker  . PPM GENERATOR CHANGEOUT N/A 12/02/2018   Procedure: PPM GENERATOR CHANGEOUT;  Surgeon: Deboraha Sprang, MD;  Location: [place] [facility];  Service: Cardiovascular;  Laterality: N/A;  . TONSILLECTOMY AND ADENOIDECTOMY      Current Outpatient Medications  Medication Sig Dispense Refill  . albuterol (PROAIR HFA) 108 (90 Base) MCG/ACT inhaler Inhale 1-2 puffs into the lungs every 6 (six) hours as needed for wheezing or shortness of breath. 1 Inhaler 5  . brimonidine (ALPHAGAN) 0.15 % ophthalmic solution Place 1 drop into both eyes 2 (two) times daily.     . budesonide-formoterol (SYMBICORT) 80-4.5 MCG/ACT inhaler Inhale 2 puffs into the lungs daily. 1 Inhaler 12  . Cholecalciferol (VITAMIN D3) 2000 UNITS capsule Take 2,000 Units by mouth daily.    Marland Kitchen dextromethorphan-guaiFENesin (MUCINEX DM) 30-600 MG 12hr tablet Take 1 tablet by mouth 2 (two) times daily.    Marland Kitchen ELIQUIS 5 MG TABS tablet TAKE 1 TABLET BY MOUTH TWICE DAILY (Patient taking differently: Take 5 mg by mouth 2 (two) times daily. ) 60 tablet 6  . famotidine (PEPCID) 20  MG tablet Take 20 mg by mouth 2 (two) times daily.    . fenofibrate (TRICOR) 145 MG tablet Take 1 tablet (145 mg total) by mouth daily. 90 tablet 1  . folic acid (FOLVITE) 1 MG tablet Take 1 mg by mouth daily.    Marland Kitchen gabapentin (NEURONTIN) 100 MG capsule Take 100 mg by mouth 3 (three) times daily.    . methotrexate 2.5 MG tablet Take 12.5 mg by mouth every Sunday.     . sodium chloride (OCEAN) 0.65 % SOLN nasal spray Place 1 spray into both nostrils as needed for congestion.    Marland Kitchen SPIRIVA HANDIHALER 18 MCG inhalation capsule PLACE 1 CAPSULE INTO INHALER AND INHALE DAILY 30 capsule 2  . traMADol (ULTRAM) 50 MG tablet Take 25-50 mg by mouth every 12 (twelve) hours as needed for moderate pain.      No current  facility-administered medications for this visit.    Allergies  Allergen Reactions  . Sulfa Antibiotics Hives and Itching  . Penicillins Hives and Palpitations    Did it involve swelling of the face/tongue/throat, SOB, or low BP? No Did it involve sudden or severe rash/hives, skin peeling, or any reaction on the inside of your mouth or nose? No Did you need to seek medical attention at a hospital or doctor's office? Unknown When did it last happen?childhood allergy If all above answers are "NO", may proceed with cephalosporin use.       Review of Systems negative except from HPI and PMH  Physical Exam BP 100/70 (BP Location: Left Arm, Patient Position: Sitting, Cuff Size: Normal)   Pulse 80   Ht 5\' 6"  (1.676 m)   Wt 171 lb 12 oz (77.9 kg)   SpO2 95%   BMI 27.72 kg/m  Well developed and well nourished in no acute distress HENT normal Neck supple with JVP-flat Clear Device pocket well healed; without hematoma or erythema.  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  The information was reviewed. No changes were made in the programming.     Atrial fibrillation-paroxysmal  Sinus node dysfunction   Dyspnea on exertion  NSCLC  ? mets    Device function is normal, BP ok without orthostatic symptoms.  No interval Afib of note On Anticoagulation;  No bleeding issues

## 2019-03-20 ENCOUNTER — Ambulatory Visit
Admission: RE | Admit: 2019-03-20 | Discharge: 2019-03-20 | Disposition: A | Discharge status: Home / Self Care | Payer: Medicare PPO | Source: Ambulatory Visit | Attending: Radiation Oncology | Admitting: Radiation Oncology

## 2019-03-20 ENCOUNTER — Other Ambulatory Visit: Payer: Self-pay

## 2019-03-20 DIAGNOSIS — C3490 Malignant neoplasm of unspecified part of unspecified bronchus or lung: Secondary | ICD-10-CM | POA: Insufficient documentation

## 2019-03-20 DIAGNOSIS — R590 Localized enlarged lymph nodes: Secondary | ICD-10-CM | POA: Diagnosis not present

## 2019-03-20 DIAGNOSIS — K449 Diaphragmatic hernia without obstruction or gangrene: Secondary | ICD-10-CM | POA: Diagnosis not present

## 2019-03-20 DIAGNOSIS — I2510 Atherosclerotic heart disease of native coronary artery without angina pectoris: Secondary | ICD-10-CM | POA: Insufficient documentation

## 2019-03-20 LAB — GLUCOSE, CAPILLARY: Glucose-Capillary: 97 mg/dL (ref 70–99)

## 2019-03-20 MED ORDER — FLUDEOXYGLUCOSE F - 18 (FDG) INJECTION
8.9000 | Freq: Once | INTRAVENOUS | Status: AC | PRN
  Administered 2019-03-20: 10:00:00 9.33 via INTRAVENOUS

## 2019-03-24 ENCOUNTER — Telehealth: Payer: Self-pay | Admitting: Pulmonary Disease

## 2019-03-24 MED ORDER — BREZTRI AEROSPHERE 160-9-4.8 MCG/ACT IN AERO: 2.0000 | INHALATION_SPRAY | Freq: Two times a day (BID) | RESPIRATORY_TRACT | 6 refills | Status: DC

## 2019-03-24 NOTE — Telephone Encounter (Signed)
Pt received sample of Breztri at last OV. Pt would like Rx, as he feels this medication is effective.  Rx has been sent to preferred pharmacy.  Pt is aware and voiced her understanding.  Nothing further is needed.

## 2019-03-25 ENCOUNTER — Telehealth: Payer: Self-pay | Admitting: Pulmonary Disease

## 2019-03-25 ENCOUNTER — Other Ambulatory Visit: Payer: Self-pay

## 2019-03-25 ENCOUNTER — Encounter: Payer: Self-pay | Admitting: Radiation Oncology

## 2019-03-25 ENCOUNTER — Encounter: Payer: Self-pay | Admitting: Oncology

## 2019-03-25 ENCOUNTER — Inpatient Hospital Stay: Payer: Medicare PPO | Attending: Oncology | Admitting: Oncology

## 2019-03-25 ENCOUNTER — Encounter: Payer: Self-pay | Admitting: *Deleted

## 2019-03-25 ENCOUNTER — Telehealth: Payer: Self-pay | Admitting: *Deleted

## 2019-03-25 ENCOUNTER — Ambulatory Visit
Admission: RE | Admit: 2019-03-25 | Discharge: 2019-03-25 | Disposition: A | Discharge status: Home / Self Care | Payer: Medicare PPO | Source: Ambulatory Visit | Attending: Radiation Oncology | Admitting: Radiation Oncology

## 2019-03-25 VITALS — BP 127/85 | HR 85 | Temp 98.9°F | Resp 16 | Ht 66.0 in | Wt 172.8 lb

## 2019-03-25 VITALS — BP 127/85 | HR 85 | Resp 16 | Wt 172.8 lb

## 2019-03-25 DIAGNOSIS — I517 Cardiomegaly: Secondary | ICD-10-CM

## 2019-03-25 DIAGNOSIS — E785 Hyperlipidemia, unspecified: Secondary | ICD-10-CM | POA: Diagnosis not present

## 2019-03-25 DIAGNOSIS — J449 Chronic obstructive pulmonary disease, unspecified: Secondary | ICD-10-CM | POA: Diagnosis not present

## 2019-03-25 DIAGNOSIS — Z79899 Other long term (current) drug therapy: Secondary | ICD-10-CM | POA: Diagnosis not present

## 2019-03-25 DIAGNOSIS — I700 Atherosclerosis of aorta: Secondary | ICD-10-CM | POA: Diagnosis not present

## 2019-03-25 DIAGNOSIS — Z9221 Personal history of antineoplastic chemotherapy: Secondary | ICD-10-CM | POA: Insufficient documentation

## 2019-03-25 DIAGNOSIS — Z8249 Family history of ischemic heart disease and other diseases of the circulatory system: Secondary | ICD-10-CM

## 2019-03-25 DIAGNOSIS — Z882 Allergy status to sulfonamides status: Secondary | ICD-10-CM

## 2019-03-25 DIAGNOSIS — K449 Diaphragmatic hernia without obstruction or gangrene: Secondary | ICD-10-CM | POA: Diagnosis not present

## 2019-03-25 DIAGNOSIS — Z923 Personal history of irradiation: Secondary | ICD-10-CM | POA: Insufficient documentation

## 2019-03-25 DIAGNOSIS — Z818 Family history of other mental and behavioral disorders: Secondary | ICD-10-CM | POA: Diagnosis not present

## 2019-03-25 DIAGNOSIS — I4891 Unspecified atrial fibrillation: Secondary | ICD-10-CM | POA: Diagnosis not present

## 2019-03-25 DIAGNOSIS — R05 Cough: Secondary | ICD-10-CM

## 2019-03-25 DIAGNOSIS — R590 Localized enlarged lymph nodes: Secondary | ICD-10-CM | POA: Diagnosis not present

## 2019-03-25 DIAGNOSIS — C3490 Malignant neoplasm of unspecified part of unspecified bronchus or lung: Secondary | ICD-10-CM

## 2019-03-25 DIAGNOSIS — Z801 Family history of malignant neoplasm of trachea, bronchus and lung: Secondary | ICD-10-CM

## 2019-03-25 DIAGNOSIS — J438 Other emphysema: Secondary | ICD-10-CM | POA: Diagnosis not present

## 2019-03-25 DIAGNOSIS — M1990 Unspecified osteoarthritis, unspecified site: Secondary | ICD-10-CM

## 2019-03-25 DIAGNOSIS — R911 Solitary pulmonary nodule: Secondary | ICD-10-CM

## 2019-03-25 DIAGNOSIS — R918 Other nonspecific abnormal finding of lung field: Secondary | ICD-10-CM | POA: Insufficient documentation

## 2019-03-25 DIAGNOSIS — Z87891 Personal history of nicotine dependence: Secondary | ICD-10-CM

## 2019-03-25 DIAGNOSIS — Z880 Allergy status to penicillin: Secondary | ICD-10-CM | POA: Diagnosis not present

## 2019-03-25 DIAGNOSIS — Z7901 Long term (current) use of anticoagulants: Secondary | ICD-10-CM

## 2019-03-25 DIAGNOSIS — R0602 Shortness of breath: Secondary | ICD-10-CM

## 2019-03-25 NOTE — Progress Notes (Signed)
  Oncology Nurse Navigator Documentation  Navigator Location: [facility]-Med Onc (03/25/19 1400) Referral Date to RadOnc/MedOnc: 03/11/19 (03/25/19 1400) )Navigator Encounter Type: Initial MedOnc (03/25/19 1400)   Abnormal Finding Date: 02/27/19 (03/25/19 1400)                   Treatment Phase: Abnormal Scans (03/25/19 1400) Barriers/Navigation Needs: Coordination of Care (03/25/19 1400)   Interventions: Coordination of Care (03/25/19 1400)   Coordination of Care: Appts;Broncoscopy (03/25/19 1400)             met with patient during initial consultation with Dr. Tasia Catchings to discuss PET scan results and next steps. All questions answered during visit. Pt informed that will be discussed at conference on Thursday 2/4 and that I will give him a call later that afternoon or Friday morning to discuss recommendations regarding biopsy. Contact info given to patient and instructed to call with any further questions or needs. Pt verbalized understanding. Nothing further needed at this time.     Time Spent with Patient: 60 (03/25/19 1400)

## 2019-03-25 NOTE — Progress Notes (Signed)
Pt here to establish care. No concerns voiced.

## 2019-03-25 NOTE — Telephone Encounter (Signed)
Pt has been scheduled for OV 03/31/2019 at 8:30a. Nothing further is needed.

## 2019-03-25 NOTE — Telephone Encounter (Signed)
Per Dr. Patsey Berthold verbally- scheduled pt for OV to discuss possible bronch.  Lm for pt to offer OV for 03/31/2019 at 8:30a.

## 2019-03-25 NOTE — Telephone Encounter (Signed)
Per 03/25/19 los: pt needs a CT HEAD W & WO CONTRAST  sched CT was scheduled as requested. I notified pt and made him aware of  his 04/04/19 CT scan appt location and time.

## 2019-03-25 NOTE — Progress Notes (Signed)
Radiation Oncology Follow up Note  Name: Darryl White   Date:   03/25/2019 MRN:  [id number] DOB: 1940-03-14    This 79 y.o. 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  He has been noted on recent CT scan to have progression of mediastinal node as well as a new nodule in the right upper lobe.  PET scan was performed both of these areas are hypermetabolic.  There is also another area 1.2 cm in the left upper lobe adjacent to confluent groundglass densities which is stable in size with SUV of 3.5 could be further metastatic disease or confluent inflammatory condition.  Patient is asymptomatic specifically denies cough hemoptysis or chest tightness his p.o. intake is good.  COMPLICATIONS OF TREATMENT: none  FOLLOW UP COMPLIANCE: keeps appointments   PHYSICAL EXAM:  BP 127/85   Pulse 85   Resp 16   Wt 172 lb 12.8 oz (78.4 kg)   BMI 27.89 kg/m  Well-developed well-nourished patient in NAD. HEENT reveals PERLA, EOMI, discs not visualized.  Oral cavity is clear. No oral mucosal lesions are identified. Neck is clear without evidence of cervical or supraclavicular adenopathy. Lungs are clear to A&P. Cardiac examination is essentially unremarkable with regular rate and rhythm without murmur rub or thrill. Abdomen is benign with no organomegaly or masses noted. Motor sensory and DTR levels are equal and symmetric in the upper and lower extremities. Cranial nerves II through XII are grossly intact. Proprioception is intact. No peripheral adenopathy or edema is identified. No motor or sensory levels are noted. Crude visual fields are within normal range.  RADIOLOGY  RESULTS: PET scan reviewed compatible with above-stated findings  PLAN: This time patient is seeing Dr. Tasia Catchings in medical oncology.  He is now stage III disease.  I agree we need tissue diagnosis especially if the patient would receive concurrent chemotherapy.  I would plan on delivering approximately 7000 cGy using IMRT treatment planning and delivery based on previous SBRT treatment as well as close approximation to spinal cord and esophagus and ability to better conformer dose and avoid critical structures.  Risks and benefits of radiation therapy again were reviewed with the patient and his wife.  They both comprehend my treatment plan well.  We will set up simulation after tissue biopsy has been obtained.  I have personally discussed the case with medical oncology.    Noreene Filbert, MD

## 2019-03-27 ENCOUNTER — Ambulatory Visit: Payer: Medicare PPO

## 2019-03-27 NOTE — Progress Notes (Signed)
Hematology/Oncology Consult note [facility] [phone] [phone]   Patient Care Team: Pleas Koch, NP as PCP - General (Internal Medicine) Deboraha Sprang, MD as Consulting Physician (Cardiology) Marlowe Sax, MD as Referring Physician (Internal Medicine) Birder Robson, MD as Referring Physician (Ophthalmology) Oneta Rack, MD as Referring Physician (Dermatology) Salli Real, DDS as Referring Physician (Dentistry) Telford Nab, RN as Registered Nurse  REFERRING PROVIDER: Noreene Filbert, MD  CHIEF COMPLAINTS/REASON FOR VISIT:  Evaluation of lung mass  HISTORY OF PRESENTING ILLNESS:   Darryl White is a  79 y.o.  male with PMH listed below was seen in consultation at the request of  Noreene Filbert, MD  for evaluation of lung mass April 2020 patient was evaluated by radiation oncology Dr. Baruch Gouty for right lung nodules concerning for progressive malignancy.   Patient had progressively enlarging media right lower lobe which was previously treated in April 2020 empirically with SBRT.  Biopsy was not done due to poor lung function secondary to COPD/moderate fibrosis. Patient also had a right upper lobe nodule being closely watched, and was found to have progressively enlarged on 10/29/2018 CT chest. 02/27/2019 patient had another CT done which showed marked progression of precarinal lymph node and a continued progression of peripheral right upper lobe pulmonary nodule. 03/20/2019 PET scan showed hypermetabolic right lower paratracheal and subcarinal adenopathy with hypermetabolic right upper lobe subpleural nodule, or significantly worsened compared to prior CT scan. Patient also had 1.2 x 1 cm left upper lobe confluent groundglass densities with low metabolic activity. Patient was referred to establish care with oncology for further evaluation and management.  Patient reports chronic shortness of breath with  exertion.  He follows up with pulmonology and was recently seen by Dr. Patsey Berthold.  Denies any hemoptysis,.  He has a history of extensive smoking history, 67-pack-year, quit in 2019. He lives with wife at home. Review of Systems  Constitutional: Negative for appetite change, chills, fatigue, fever and unexpected weight change.  HENT:   Negative for hearing loss and voice change.   Eyes: Negative for eye problems and icterus.  Respiratory: Positive for cough and shortness of breath. Negative for chest tightness.   Cardiovascular: Negative for chest pain and leg swelling.  Gastrointestinal: Negative for abdominal distention and abdominal pain.  Endocrine: Negative for hot flashes.  Genitourinary: Negative for difficulty urinating, dysuria and frequency.   Musculoskeletal: Negative for arthralgias.  Skin: Negative for itching and rash.  Neurological: Negative for light-headedness and numbness.  Hematological: Negative for adenopathy. Does not bruise/bleed easily.  Psychiatric/Behavioral: Negative for confusion.    MEDICAL HISTORY:  Past Medical History:  Diagnosis Date  . Arthritis 2019   dx of osteoarthritis and rhemautoid arthritis  . Cancer ([place])   . Cataract 2019   left eye  . Chicken pox   . Collagen vascular disease ([place])   . 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 COPD (chronic obstructive pulmonary disease) ([place])   . Diverticulitis   . 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 History of stomach ulcers   . Hyperlipidemia   . Lymphocytic colitis   . Orthostatic hypotension   . Osteoarthritis   . Pancreatitis   . Paroxysmal atrial fibrillation ([place])     SURGICAL HISTORY: Past Surgical History:  Procedure Laterality Date  . APPENDECTOMY    . BACK SURGERY    . large intestine block    . PACEMAKER INSERTION  MDT dual chamber pacemaker  . PPM GENERATOR CHANGEOUT N/A 12/02/2018   Procedure: PPM GENERATOR CHANGEOUT;  Surgeon: Deboraha Sprang, MD;   Location: [facility];  Service: Cardiovascular;  Laterality: N/A;  . TONSILLECTOMY AND ADENOIDECTOMY      SOCIAL HISTORY: Social History   Socioeconomic History  . Marital status: Married    Spouse name: Not on file  . Number of children: 2  . Years of education: Not on file  . Highest education level: Not on file  Occupational History  . Occupation: Retired  Tobacco Use  . Smoking status: Former Smoker    Packs/day: 1.00    Years: 60.00    Pack years: 60.00    Quit date: 04/29/2017    Years since quitting: 1.9  . Smokeless tobacco: Never Used  Substance and Sexual Activity  . Alcohol use: Yes    Alcohol/week: 5.0 standard drinks    Types: 5 Glasses of wine per week  . Drug use: No  . Sexual activity: Never  Other Topics Concern  . Not on file  Social History Narrative   Married.   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      Does not have a living will.   Desires CPR, does not want prolonged life support if futile.   Social Determinants of Health   Financial Resource Strain: Low Risk   . Difficulty of Paying Living Expenses: Not hard at all  Food Insecurity: No Food Insecurity  . Worried About Charity fundraiser in the Last Year: Never true  . Ran Out of Food in the Last Year: Never true  Transportation Needs: No Transportation Needs  . Lack of Transportation (Medical): No  . Lack of Transportation (Non-Medical): No  Physical Activity: Sufficiently Active  . Days of Exercise per Week: 7 days  . Minutes of Exercise per Session: 60 min  Stress: No Stress Concern Present  . Feeling of Stress : Not at all  Social Connections:   . Frequency of Communication with Friends and Family: Not on file  . Frequency of Social Gatherings with Friends and Family: Not on file  . Attends Religious Services: Not on file  . Active Member of Clubs or Organizations: Not on file  . Attends Archivist Meetings: Not on file  . Marital Status: Not on file   Intimate Partner Violence: Not At Risk  . Fear of Current or Ex-Partner: No  . Emotionally Abused: No  . Physically Abused: No  . Sexually Abused: No    FAMILY HISTORY: Family History  Problem Relation Age of Onset  . Lung cancer Mother   . Heart disease Father   . Alzheimer's disease Father     ALLERGIES:  is allergic to sulfa antibiotics and penicillins.  MEDICATIONS:  Current Outpatient Medications  Medication Sig Dispense Refill  . albuterol (PROAIR HFA) 108 (90 Base) MCG/ACT inhaler Inhale 1-2 puffs into the lungs every 6 (six) hours as needed for wheezing or shortness of breath. 1 Inhaler 5  . brimonidine (ALPHAGAN) 0.15 % ophthalmic solution Place 1 drop into both eyes 2 (two) times daily.     . Budeson-Glycopyrrol-Formoterol (BREZTRI AEROSPHERE) 160-9-4.8 MCG/ACT AERO Inhale 2 puffs into the lungs 2 (two) times daily. 10.7 g 6  . Cholecalciferol (VITAMIN D3) 2000 UNITS capsule Take 2,000 Units by mouth daily.    Marland Kitchen dextromethorphan-guaiFENesin (MUCINEX DM) 30-600 MG 12hr tablet Take 1 tablet by mouth 2 (two) times daily.    Marland Kitchen  ELIQUIS 5 MG TABS tablet TAKE 1 TABLET BY MOUTH TWICE DAILY (Patient taking differently: Take 5 mg by mouth 2 (two) times daily. ) 60 tablet 6  . famotidine (PEPCID) 20 MG tablet Take 20 mg by mouth 2 (two) times daily.    . fenofibrate (TRICOR) 145 MG tablet Take 1 tablet (145 mg total) by mouth daily. 90 tablet 1  . folic acid (FOLVITE) 1 MG tablet Take 1 mg by mouth daily.    Marland Kitchen gabapentin (NEURONTIN) 100 MG capsule Take 100 mg by mouth 3 (three) times daily.    . methotrexate 2.5 MG tablet Take 12.5 mg by mouth every Sunday.     . sodium chloride (OCEAN) 0.65 % SOLN nasal spray Place 1 spray into both nostrils as needed for congestion.    . traMADol (ULTRAM) 50 MG tablet Take 25-50 mg by mouth every 12 (twelve) hours as needed for moderate pain.      No current facility-administered medications for this visit.     PHYSICAL EXAMINATION: ECOG  PERFORMANCE STATUS: 1 - Symptomatic but completely ambulatory Vitals:   03/25/19 0957  BP: 127/85  Pulse: 85  Resp: 16  Temp: 98.9 F (37.2 C)  SpO2: 92%   Filed Weights   03/25/19 0957  Weight: 172 lb 12.8 oz (78.4 kg)    Physical Exam Constitutional:      General: He is not in acute distress.    Comments: Patient walks independently  HENT:     Head: Normocephalic and atraumatic.  Eyes:     General: No scleral icterus. Cardiovascular:     Rate and Rhythm: Normal rate and regular rhythm.     Heart sounds: Normal heart sounds.  Pulmonary:     Effort: Pulmonary effort is normal. No respiratory distress.     Breath sounds: No wheezing.     Comments: Severely decreased breath sound bilaterally. Abdominal:     General: Bowel sounds are normal. There is no distension.     Palpations: Abdomen is soft.  Musculoskeletal:        General: No deformity. Normal range of motion.     Cervical back: Normal range of motion and neck supple.  Skin:    General: Skin is warm and dry.     Findings: No erythema or rash.  Neurological:     Mental Status: He is alert and oriented to person, place, and time. Mental status is at baseline.     Cranial Nerves: No cranial nerve deficit.     Coordination: Coordination normal.  Psychiatric:        Mood and Affect: Mood normal.     LABORATORY DATA:  I have reviewed the data as listed Lab Results  Component Value Date   WBC 6.2 03/13/2019   HGB 14.7 03/13/2019   HCT 43.7 03/13/2019   MCV 106.6 (H) 03/13/2019   PLT 186.0 03/13/2019   Recent Labs    06/18/18 1016 06/18/18 1016 11/28/18 1256 02/27/19 1023 03/13/19 0806  NA 137  --  134*  --  134*  K 4.8  --  4.4  --  4.6  CL 103  --  101  --  102  CO2 27  --  26  --  26  GLUCOSE 109*  --  117*  --  105*  BUN 22  --  19  --  18  CREATININE 1.32*   < > 1.23 1.20 1.18  CALCIUM 9.5  --  9.4  --  9.4  GFRNONAA 52*  --  56*  --   --   GFRAA 60*  --  >60  --   --   PROT 7.0  --   --    --  6.9  ALBUMIN 4.2  --   --   --  4.2  AST 65*  --   --   --  53*  ALT 47*  --   --   --  33  ALKPHOS 49  --   --   --  71  BILITOT 0.8  --   --   --  0.9   < > = values in this interval not displayed.   Iron/TIBC/Ferritin/ %Sat No results found for: IRON, TIBC, FERRITIN, IRONPCTSAT    RADIOGRAPHIC STUDIES: I have personally reviewed the radiological images as listed and agreed with the findings in the report.  CT Chest W Contrast  Result Date: 02/27/2019 CLINICAL DATA:  Non-small-cell lung cancer. EXAM: CT CHEST WITH CONTRAST TECHNIQUE: Multidetector CT imaging of the chest was performed during intravenous contrast administration. CONTRAST:  17mL OMNIPAQUE IOHEXOL 300 MG/ML  SOLN COMPARISON:  10/29/2018 FINDINGS: Cardiovascular: Heart size upper normal. No substantial pericardial effusion. Coronary artery calcification is evident. Atherosclerotic calcification is noted in the wall of the thoracic aorta. Left-sided permanent pacemaker noted. Mediastinum/Nodes: Marked progression of precarinal lymph node, now measuring 2.1 cm short axis. Stable 10 mm short axis AP window node. 12 mm short axis subcarinal node not substantially changed in the interval. Small lymph nodes are seen in the hilar regions bilaterally. The esophagus has normal imaging features. Tiny hiatal hernia noted. There is no axillary lymphadenopathy. Lungs/Pleura: Centrilobular and paraseptal emphysema evident. Peripheral right upper lobe lesion measured previously at 2.2 x 0.9 cm now measures 2.6 x 1.4 cm (71/3). Subpleural 6 mm right middle lobe nodule measured previously is stable today (105/3). Left lower lobe nodule measured at 8 mm maximum diameter previously is stable at 8 mm today (113/3). Ill-defined nodular opacity measured in the medial right lung base previously at 1.7 cm measures 2.0 cm today (129/3). Multiple additional tiny nodular opacities are noted in the lungs bilaterally. Lower lung predominant chronic  interstitial opacity suggest component of underlying fibrotic lung disease. Upper Abdomen: Small focus of hyperenhancement in the inferior right liver (162/2) is stable since prior. Subtle nodularity of liver contour raises the question of cirrhosis. Musculoskeletal: No worrisome lytic or sclerotic osseous abnormality. IMPRESSION: 1. Marked progression of a precarinal lymph node showing central low attenuation suggesting necrosis. Progression highly suspicious for metastatic disease. 2. Continued progression of the peripheral right upper lobe pulmonary nodule. Ill-defined nodular opacity in the medial right lower lobe also measures slightly progressed. 3. Remaining index pulmonary nodules measured on the previous study are stable. Few additional scattered tiny bilateral pulmonary nodules are evident today and attention on follow-up recommended. 4. Subtle nodularity of liver contour raises the question of cirrhosis although this is not a definite finding. The patient does have a small hypervascular lesion in the inferior right liver, stable since 10/29/2018. Continued close attention recommended. 5.  Aortic Atherosclerois (ICD10-170.0) 6.  Emphysema. ([id number]-Q46.9) Electronically Signed   By: Misty Stanley M.D.   On: 02/27/2019 11:02   NM PET Image Restag (PS) Skull Base To Thigh  Result Date: 03/20/2019 CLINICAL DATA:  Subsequent treatment strategy for non-small cell lung cancer. EXAM: NUCLEAR MEDICINE PET SKULL BASE TO THIGH TECHNIQUE: 9.3 mCi F-18 FDG was injected intravenously. Full-ring PET imaging was performed from the skull base  to thigh after the radiotracer. CT data was obtained and used for attenuation correction and anatomic localization. Fasting blood glucose: 97 mg/dl COMPARISON:  Multiple exams, including CT chest 02/27/2019 and prior PET-CT from 02/18/2018 FINDINGS: Mediastinal blood pool activity: SUV max 2.7 Liver activity: SUV max NA NECK: Bilateral hypermetabolic posterior parotid nodules are  present, partially obscured on the CT data due to streak artifact from the patient's fillings. The left posterior nodule has a maximum SUV of 6.6 (formerly 10.3) and the right posterior parotid nodule has maximum SUV of 7.9 (formerly 9.8). Incidental CT findings: Bilateral common carotid atherosclerotic calcification. CHEST: The right paratracheal node anterior to the carina measures 2.3 cm in short axis on image 89/3 (formerly measured at 2.1 cm on 02/27/2019 and about 0.7 cm on 02/18/2018) with maximum SUV 10.8, compatible with malignancy. The small lymph nodes in this vicinity on the prior PET-CT were not hypermetabolic. The peripheral right upper lobe subpleural nodule on image 93/3 measures 1.4 by 2.4 cm, essentially stable from 02/27/2019 but increased in size from the 02/18/2018 exam. Maximum SUV 12.4, previously 4.3 on 02/18/2018. A subcarinal lymph node measuring 1.2 cm in short axis on image 96/3 is stable from 02/27/2019, and has a maximum SUV of 4.6 (previously not hypermetabolic). Along the confluent interstitial opacity in the left upper lobe adjacent to the major fissure, there is a 1.2 by 1.0 cm nodule on image 100/3 which is roughly stable from the 02/27/2019 exam, with maximum SUV 3.5, suspicious for malignancy. There is some hazy accentuated metabolic activity throughout the adjacent ground-glass opacity in the left upper lobe and left lower lobe. Along the medial margin of the right pectoralis minor muscle there is a small focus of accentuated metabolic activity which may be from an adjacent lymph node, measuring with maximum SUV of 3.5. It is possible that this simply represents physiologic muscular activity. Incidental CT findings: Pacer/defibrillator device noted. Coronary, aortic arch, and branch vessel atherosclerotic vascular disease. Mild cardiomegaly. Small type 1 hiatal hernia. Peripheral interstitial accentuation in the lungs favors fibrosis. Extensive centrilobular emphysema.  ABDOMEN/PELVIS: No significant abnormal hypermetabolic activity in this region. Incidental CT findings: Aortoiliac atherosclerotic vascular disease. Exophytic hyperdense 1.2 by 0.9 cm lesion of the left kidney lower pole is photopenic and likely a small complex but benign cyst. Scattered colonic diverticula. SKELETON: No significant abnormal hypermetabolic activity in this region. Incidental CT findings: none IMPRESSION: 1. Hypermetabolic right lower paratracheal and subcarinal adenopathy with hypermetabolic right upper lobe subpleural nodule, all significantly worsened compared to the prior PET-CT, but roughly similar in size to the chest CT from 3 weeks ago. 2. A 1.2 by 1.0 cm nodule in the left upper lobe adjacent to confluent ground-glass densities is stable in size from 3 weeks ago but new from 02/18/2018, with maximum SUV of 3.5, potentially from metastatic disease or confluent inflammatory condition. 3. A tiny focus of hypermetabolic activity along the right upper pectoralis minor muscle medial margin could be from a small hypermetabolic lymph node or simply due to physiologic activity in the muscle. 4. Bilateral parotid nodules, moderately reduced in activity compared to the prior exam, appearance favoring Warthin's tumors. 5. Other imaging findings of potential clinical significance: Aortic Atherosclerosis (ICD10-I70.0) and Emphysema (ICD10-J43.9). Coronary atherosclerosis with mild cardiomegaly. Small type 1 hiatal hernia. Suspected fibrosis in the lungs. Left kidney lower pole hyperdense lesion is likely a complex cyst. Electronically Signed   By: Van Clines M.D.   On: 03/20/2019 12:08      ASSESSMENT &  PLAN:  1. Lung nodule   2. Former smoker    Images were independently reviewed by me and discussed with patient. Right upper lobe nodule and right paratracheal and subcarinal adenopathy with intense hypermetabolic activity likely primary lung cancer, clinically T1cN2 Left upper nodule  with lower metabolic activity, reactive versus metastasis versus synchronize malignancy. Presented patient's case on tumor board. Consensus was reached on proceeding with tissue diagnosis, if confirmed malignancy, will treat with concurrent radiation and chemotherapy for presumed T1c N2 Mx, and left upper lobe nodule will be closely monitored and possibly treated later with SBRT. I also communicated with patient's pulmonologist Dr. Patsey Berthold who will discuss with patient next week about EBUS and biopsy.  CT brain with and without contrast to finish staging. Patient reports to pacemaker which was not compatible with MRI.  Orders Placed This Encounter  Procedures  . CT Head W Wo Contrast    Standing Status:   Future    Standing Expiration Date:   03/24/2020    Order Specific Question:   ** REASON FOR EXAM (FREE TEXT)    Answer:   lung nodule, rule out mets    Order Specific Question:   If indicated for the ordered procedure, I authorize the administration of contrast media per Radiology protocol    Answer:   Yes    Order Specific Question:   Preferred imaging location?    Answer:   [facility]    Order Specific Question:   Radiology Contrast Protocol - do NOT remove file path    Answer:   [email or web address]  . Comprehensive metabolic panel    Standing Status:   Future    Standing Expiration Date:   03/24/2020    All questions were answered. The patient knows to call the clinic with any problems questions or concerns.  cc Noreene Filbert, MD    Return of visit: To be determined. Thank you for this kind referral and the opportunity to participate in the care of this patient. A copy of today's note is routed to referring provider     Earlie Server, MD, PhD Hematology Oncology [facility] at [facility] Pager- [phone] 03/27/2019

## 2019-03-31 ENCOUNTER — Other Ambulatory Visit: Payer: Self-pay

## 2019-03-31 ENCOUNTER — Telehealth: Payer: Self-pay

## 2019-03-31 ENCOUNTER — Ambulatory Visit: Payer: Medicare PPO | Admitting: Pulmonary Disease

## 2019-03-31 ENCOUNTER — Encounter: Payer: Self-pay | Admitting: Pulmonary Disease

## 2019-03-31 VITALS — HR 78 | Temp 97.6°F | Ht 66.0 in | Wt 172.6 lb

## 2019-03-31 DIAGNOSIS — R590 Localized enlarged lymph nodes: Secondary | ICD-10-CM | POA: Diagnosis not present

## 2019-03-31 DIAGNOSIS — R918 Other nonspecific abnormal finding of lung field: Secondary | ICD-10-CM

## 2019-03-31 DIAGNOSIS — I480 Paroxysmal atrial fibrillation: Secondary | ICD-10-CM

## 2019-03-31 DIAGNOSIS — J449 Chronic obstructive pulmonary disease, unspecified: Secondary | ICD-10-CM

## 2019-03-31 NOTE — Telephone Encounter (Signed)
03/31/2019 at 11:07 am  EST spoke with Sophia at [facility]. Procedure codes [phone], [phone] and [phone] are all V/B codes which does not require PA. Call Ref # [id number]. Rhonda J Cobb

## 2019-03-31 NOTE — Patient Instructions (Signed)
You are scheduled for a biopsy on 15 February at 7:30 in the morning.   We will continue to follow closely we will see you in follow-up in 2 to 3 weeks after the procedure

## 2019-03-31 NOTE — Telephone Encounter (Signed)
Pt is scheduled for Bronch with EBUS on 04/07/2019 at 730am. Dx: mediastinal adenopathy. CPT codes: [id number], [id number], [id number]. Rhonda please see bronch information.

## 2019-03-31 NOTE — H&P (View-Only) (Signed)
Subjective:    Patient ID: Darryl White, male    DOB: 09-05-1940, 79 y.o.   MRN: [id number]  HPI Patient is a 79 year old former smoker who follows here for the issue of a right upper lobe subpleural mass and mediastinal adenopathy.  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  Most recently we saw him on 18 March 2019.  At that time he had had CT of the chest that had shown significant development of precarinal adenopathy not previously noted, with central necrosis.  He also had a right upper lobe subpleural mass not amenable to diagnosis by bronchoscopy.  The precarinal node is amenable to biopsy by EBUS.  During his last visit on 26 January he was started on Micanopy which he states is helping him tremendously with his dyspnea on exertion.  He feels more energetic on the medication.  Patient underwent PET/CT on March 20, 2019,the films have been reviewed independently as well as with the patient and his wife.  The precarinal lymph node shows significant FDG avidity with an SUV of 10.8, likewise the subpleural nodule/mass on the right upper lobe is hypermetabolic.  Again the patient is totally asymptomatic with regards to these findings.  He has had no respiratory symptoms, no constitutional symptoms.   Review of Systems A 10 point review of systems was performed and it is as noted above otherwise negative.    Objective:   Physical Exam Pulse 78   Temp 97.6 F (36.4 C) (Temporal)   Ht 5\' 6"  (1.676 m)   Wt 172 lb 9.6 oz (78.3 kg)   SpO2 94% Comment: on ra  BMI 27.86 kg/m  GENERAL: Awake alert elderly male, conversant, no distress, fully ambulatory HEAD: Normocephalic, atraumatic.  EYES: Pupils equal, round, reactive to light. No scleral icterus.  MOUTH: Nose/mouth/throat not examined due to masking requirements for COVID 19. NECK: Supple. No thyromegaly. No nodules. No JVD. Trachea midline, no lymphadenopathy. PULMONARY: Very distant  lung sounds with no adventitious sounds. CARDIOVASCULAR: S1 and S2. Regular rate and rhythm. No murmur appreciated.  Pacemaker in place. GASTROINTESTINAL: Nondistended abdomen. MUSCULOSKELETAL: No joint swelling, no clubbing, no edema.  NEUROLOGIC: No focal deficit. Fluent speech. SKIN: Intact,warm,dry. Limited exam no rashes noted. PSYCH: Mood and behavior normal.          Assessment & Plan:   Mediastinal adenopathy (central), precarinal Right upper lobe mass, pleural-based  High SUV consistent with malignancy  Right upper lobe mass not amenable to diagnosis by bronchoscopy  Mediastinal adenopathy amenable to diagnosis by bronchoscopy and endobronchial ultrasound (EBUS)  Procedure scheduled for 12 February 7:30 AM  Benefits, limitations and potential complications of the procedure were discussed with the patient/family  including, but not limited to bleeding, hemoptysis, respiratory failure requiring intubation and/or prolongued mechanical ventilation, infection, pneumothorax (collapse of lung) requiring chest tube placement, stroke from air embolism or even death  Patient agrees to proceed  Hold Eliquis after morning dose of Friday the 12th of February  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  He is paced with no evidence of dysrhythmia  No recent syncope (an issue in the past)  Eliquis being held after morning dose 12 February until after procedure  Instructions conveyed to the patient   C. Derrill Kay, MD [facility]  *  This note was dictated using voice recognition software/Dragon.  Despite best efforts to proofread, errors can occur which can change the meaning.  Any change was purely unintentional.

## 2019-03-31 NOTE — Progress Notes (Addendum)
Subjective:    Patient ID: Darryl White, male    DOB: [birth year], 79 y.o.   MRN: [id number]  HPI Patient is a 79 year old former smoker who follows here for the issue of a right upper lobe subpleural mass and mediastinal adenopathy.  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  Most recently we saw him on 18 March 2019.  At that time he had had CT of the chest that had shown significant development of precarinal adenopathy not previously noted, with central necrosis.  He also had a right upper lobe subpleural mass not amenable to diagnosis by bronchoscopy.  The precarinal node is amenable to biopsy by EBUS.  During his last visit on 26 January he was started on [place] which he states is helping him tremendously with his dyspnea on exertion.  He feels more energetic on the medication.  Patient underwent PET/CT on March 20, 2019,the films have been reviewed independently as well as with the patient and his wife.  The precarinal lymph node shows significant FDG avidity with an SUV of 10.8, likewise the subpleural nodule/mass on the right upper lobe is hypermetabolic.  Again the patient is totally asymptomatic with regards to these findings.  He has had no respiratory symptoms, no constitutional symptoms.   Review of Systems A 10 point review of systems was performed and it is as noted above otherwise negative.    Objective:   Physical Exam Pulse 78   Temp 97.6 F (36.4 C) (Temporal)   Ht 5\' 6"  (1.676 m)   Wt 172 lb 9.6 oz (78.3 kg)   SpO2 94% Comment: on ra  BMI 27.86 kg/m  GENERAL: Awake alert elderly male, conversant, no distress, fully ambulatory HEAD: Normocephalic, atraumatic.  EYES: Pupils equal, round, reactive to light. No scleral icterus.  MOUTH: Nose/mouth/throat not examined due to masking requirements for COVID 19. NECK: Supple. No thyromegaly. No nodules. No JVD. Trachea midline, no lymphadenopathy. PULMONARY: Very distant  lung sounds with no adventitious sounds. CARDIOVASCULAR: S1 and S2. Regular rate and rhythm. No murmur appreciated.  Pacemaker in place. GASTROINTESTINAL: Nondistended abdomen. MUSCULOSKELETAL: No joint swelling, no clubbing, no edema.  NEUROLOGIC: No focal deficit. Fluent speech. SKIN: Intact,warm,dry. Limited exam no rashes noted. PSYCH: Mood and behavior normal.          Assessment & Plan:   Mediastinal adenopathy (central), precarinal Right upper lobe mass, pleural-based  High SUV consistent with malignancy  Right upper lobe mass not amenable to diagnosis by bronchoscopy  Mediastinal adenopathy amenable to diagnosis by bronchoscopy and endobronchial ultrasound (EBUS)  Procedure scheduled for 12 February 7:30 AM  Benefits, limitations and potential complications of the procedure were discussed with the patient/family  including, but not limited to bleeding, hemoptysis, respiratory failure requiring intubation and/or prolongued mechanical ventilation, infection, pneumothorax (collapse of lung) requiring chest tube placement, stroke from air embolism or even death  Patient agrees to proceed  Hold Eliquis after morning dose of Friday the 12th of February  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  He is paced with no evidence of dysrhythmia  No recent syncope (an issue in the past)  Eliquis being held after morning dose 12 February until after procedure  Instructions conveyed to the patient   C. Derrill Kay, MD [facility]  *  This note was dictated using voice recognition software/Dragon.  Despite best efforts to proofread, errors can occur which can change the meaning.  Any change was purely unintentional.

## 2019-03-31 NOTE — Telephone Encounter (Signed)
Called and made pt aware that his preop appt is 04/02/2019 via phone between the hours of 8am-1pm and his COVID test is on 04/03/2019 between the hours of 8am-11am. He verbalized his understanding.

## 2019-04-01 ENCOUNTER — Encounter: Payer: Self-pay | Admitting: Pulmonary Disease

## 2019-04-02 ENCOUNTER — Encounter
Admission: RE | Admit: 2019-04-02 | Discharge: 2019-04-02 | Disposition: A | Discharge status: Home / Self Care | Payer: Medicare PPO | Source: Ambulatory Visit | Attending: Pulmonary Disease | Admitting: Pulmonary Disease

## 2019-04-02 ENCOUNTER — Other Ambulatory Visit: Payer: Self-pay

## 2019-04-02 HISTORY — DX: Gastro-esophageal reflux disease without esophagitis: K21.9

## 2019-04-02 HISTORY — DX: Presence of cardiac pacemaker: Z95.0

## 2019-04-02 HISTORY — DX: Personal history of other diseases of the digestive system: Z87.19

## 2019-04-02 NOTE — Patient Instructions (Addendum)
Your procedure is scheduled on: Monday, Feb. 15 Report to Day Surgery on the 2nd floor of the [facility]. To find out your arrival time, please call [phone] between 1PM - 3PM on: Friday, Feb. 12  REMEMBER: Instructions that are not followed completely may result in serious medical risk, up to and including death; or upon the discretion of your surgeon and anesthesiologist your surgery may need to be rescheduled.  Do not eat food after midnight the night before surgery.  No gum chewing, lozengers or hard candies.  You may however, drink water up to 2 hours before you are scheduled to arrive for your surgery. Do not drink anything within 2 hours of the start of your surgery.  No Alcohol for 24 hours before or after surgery.  No Smoking including e-cigarettes for 24 hours prior to surgery.   On the morning of surgery brush your teeth with toothpaste and water, you may rinse your mouth with mouthwash if you wish. Do not swallow any toothpaste or mouthwash.  Notify your doctor if there is any change in your medical condition (cold, fever, infection).  Do not wear jewelry.  Do not wear lotions, powders, or perfumes.   Do not shave 48 hours prior to surgery.   Do not bring valuables to the hospital, including drivers license, insurance or credit cards.  [facility] is not responsible for any belongings or valuables.   TAKE THESE MEDICATIONS THE MORNING OF SURGERY:  1.  Albuterol inhaler 2.  Brimonidine eye drops 3.  breztri inhaler 4.  Famotidine - (take one the night before and one on the morning of surgery - helps to prevent nausea after surgery.) 5.  Fenofibrate 6.  Gabapentin  Use inhalers on the day of surgery and bring to the hospital.  Follow recommendations from Cardiologist, Pulmonologist or PCP regarding stopping Eliquis.                           Hold Eliquis after morning dose of Friday, Feb. 12; resume after surgery as instructed by your doctor.  Stop  Anti-inflammatories (NSAIDS) such as Advil, Aleve, Ibuprofen, Motrin, Naproxen, Naprosyn and Aspirin based products such as Excedrin, [place] Powder, BC Powder. (May take Tylenol or Acetaminophen if needed.)  If you are being discharged the day of surgery, you will not be allowed to drive home. You will need a responsible adult to drive you home and stay with you that night.   If you are taking public transportation, you will need to have a responsible adult with you. Please confirm with your physician that it is acceptable to use public transportation.   Please call [phone] if you have any questions about these instructions.

## 2019-04-03 ENCOUNTER — Other Ambulatory Visit: Payer: Self-pay

## 2019-04-03 ENCOUNTER — Other Ambulatory Visit
Admission: RE | Admit: 2019-04-03 | Discharge: 2019-04-03 | Disposition: A | Discharge status: Home / Self Care | Payer: Medicare PPO | Source: Ambulatory Visit | Attending: Pulmonary Disease | Admitting: Pulmonary Disease

## 2019-04-03 ENCOUNTER — Inpatient Hospital Stay: Payer: Medicare PPO

## 2019-04-03 DIAGNOSIS — Z01812 Encounter for preprocedural laboratory examination: Secondary | ICD-10-CM | POA: Diagnosis present

## 2019-04-03 DIAGNOSIS — Z20822 Contact with and (suspected) exposure to covid-19: Secondary | ICD-10-CM | POA: Diagnosis not present

## 2019-04-03 DIAGNOSIS — R911 Solitary pulmonary nodule: Secondary | ICD-10-CM | POA: Diagnosis not present

## 2019-04-03 LAB — COMPREHENSIVE METABOLIC PANEL
ALT: 31 U/L (ref 0–44)
AST: 53 U/L — ABNORMAL HIGH (ref 15–41)
Albumin: 4 g/dL (ref 3.5–5.0)
Alkaline Phosphatase: 60 U/L (ref 38–126)
Anion gap: 8 (ref 5–15)
BUN: 18 mg/dL (ref 8–23)
CO2: 24 mmol/L (ref 22–32)
Calcium: 9.3 mg/dL (ref 8.9–10.3)
Chloride: 101 mmol/L (ref 98–111)
Creatinine, Ser: 1.24 mg/dL (ref 0.61–1.24)
GFR calc Af Amer: >60 mL/min (ref >60)
GFR calc non Af Amer: 55 mL/min — ABNORMAL LOW (ref >60)
Glucose, Bld: 108 mg/dL — ABNORMAL HIGH (ref 70–99)
Potassium: 4.8 mmol/L (ref 3.5–5.1)
Sodium: 133 mmol/L — ABNORMAL LOW (ref 135–145)
Total Bilirubin: 1.2 mg/dL (ref 0.3–1.2)
Total Protein: 7 g/dL (ref 6.5–8.1)

## 2019-04-03 LAB — SARS CORONAVIRUS 2 (TAT 6-24 HRS): SARS Coronavirus 2: NEGATIVE

## 2019-04-04 ENCOUNTER — Ambulatory Visit
Admission: RE | Admit: 2019-04-04 | Discharge: 2019-04-04 | Disposition: A | Discharge status: Home / Self Care | Payer: Medicare PPO | Source: Ambulatory Visit | Attending: Oncology | Admitting: Oncology

## 2019-04-04 DIAGNOSIS — R911 Solitary pulmonary nodule: Secondary | ICD-10-CM | POA: Diagnosis not present

## 2019-04-04 MED ORDER — IOHEXOL 300 MG/ML  SOLN
75.0000 mL | Freq: Once | INTRAMUSCULAR | Status: AC | PRN
  Administered 2019-04-04: 75 mL via INTRAVENOUS

## 2019-04-07 ENCOUNTER — Encounter
Admission: RE | Disposition: A | Discharge status: Home / Self Care | Payer: Self-pay | Source: Home / Self Care | Attending: Pulmonary Disease

## 2019-04-07 ENCOUNTER — Ambulatory Visit: Payer: Medicare PPO | Admitting: Anesthesiology

## 2019-04-07 ENCOUNTER — Ambulatory Visit
Admission: RE | Admit: 2019-04-07 | Discharge: 2019-04-07 | Disposition: A | Discharge status: Home / Self Care | Payer: Medicare PPO | Attending: Pulmonary Disease | Admitting: Pulmonary Disease

## 2019-04-07 ENCOUNTER — Encounter: Payer: Self-pay | Admitting: Pulmonary Disease

## 2019-04-07 ENCOUNTER — Other Ambulatory Visit: Payer: Self-pay

## 2019-04-07 DIAGNOSIS — R918 Other nonspecific abnormal finding of lung field: Secondary | ICD-10-CM | POA: Diagnosis not present

## 2019-04-07 DIAGNOSIS — C801 Malignant (primary) neoplasm, unspecified: Secondary | ICD-10-CM | POA: Diagnosis not present

## 2019-04-07 DIAGNOSIS — I480 Paroxysmal atrial fibrillation: Secondary | ICD-10-CM

## 2019-04-07 DIAGNOSIS — J42 Unspecified chronic bronchitis: Secondary | ICD-10-CM | POA: Diagnosis not present

## 2019-04-07 DIAGNOSIS — R590 Localized enlarged lymph nodes: Secondary | ICD-10-CM

## 2019-04-07 DIAGNOSIS — Z87891 Personal history of nicotine dependence: Secondary | ICD-10-CM | POA: Insufficient documentation

## 2019-04-07 DIAGNOSIS — Z7901 Long term (current) use of anticoagulants: Secondary | ICD-10-CM | POA: Diagnosis not present

## 2019-04-07 DIAGNOSIS — C771 Secondary and unspecified malignant neoplasm of intrathoracic lymph nodes: Secondary | ICD-10-CM | POA: Insufficient documentation

## 2019-04-07 DIAGNOSIS — Z950 Presence of cardiac pacemaker: Secondary | ICD-10-CM | POA: Insufficient documentation

## 2019-04-07 DIAGNOSIS — K219 Gastro-esophageal reflux disease without esophagitis: Secondary | ICD-10-CM | POA: Insufficient documentation

## 2019-04-07 DIAGNOSIS — J439 Emphysema, unspecified: Secondary | ICD-10-CM | POA: Diagnosis not present

## 2019-04-07 HISTORY — PX: VIDEO BRONCHOSCOPY WITH ENDOBRONCHIAL ULTRASOUND: SHX6177

## 2019-04-07 SURGERY — BRONCHOSCOPY, WITH EBUS
Anesthesia: General

## 2019-04-07 MED ORDER — FENTANYL CITRATE (PF) 100 MCG/2ML IJ SOLN: 25.0000 ug | INTRAMUSCULAR | Status: DC | PRN

## 2019-04-07 MED ORDER — FENTANYL CITRATE (PF) 100 MCG/2ML IJ SOLN
INTRAMUSCULAR | Status: AC
  Filled 2019-04-07: qty 2

## 2019-04-07 MED ORDER — FENTANYL CITRATE (PF) 100 MCG/2ML IJ SOLN
INTRAMUSCULAR | Status: DC | PRN
  Administered 2019-04-07: 25 ug via INTRAVENOUS
  Administered 2019-04-07: 50 ug via INTRAVENOUS
  Administered 2019-04-07: 25 ug via INTRAVENOUS

## 2019-04-07 MED ORDER — SUGAMMADEX SODIUM 200 MG/2ML IV SOLN
INTRAVENOUS | Status: DC | PRN
  Administered 2019-04-07: 151.6 mg via INTRAVENOUS

## 2019-04-07 MED ORDER — LIDOCAINE HCL (CARDIAC) PF 100 MG/5ML IV SOSY
PREFILLED_SYRINGE | INTRAVENOUS | Status: DC | PRN
  Administered 2019-04-07 (×2): 100 mg via INTRAVENOUS

## 2019-04-07 MED ORDER — SODIUM CHLORIDE 0.9 % IV SOLN
Freq: Once | INTRAVENOUS | Status: AC
  Administered 2019-04-07: 15 mL/h via INTRAVENOUS

## 2019-04-07 MED ORDER — ONDANSETRON HCL 4 MG/2ML IJ SOLN: 4.0000 mg | Freq: Once | INTRAMUSCULAR | Status: DC | PRN

## 2019-04-07 MED ORDER — PROPOFOL 10 MG/ML IV BOLUS
INTRAVENOUS | Status: AC
  Filled 2019-04-07: qty 20

## 2019-04-07 MED ORDER — BUTAMBEN-TETRACAINE-BENZOCAINE 2-2-14 % EX AERO
1.0000 | INHALATION_SPRAY | Freq: Once | CUTANEOUS | Status: DC
  Filled 2019-04-07: qty 20

## 2019-04-07 MED ORDER — ONDANSETRON HCL 4 MG/2ML IJ SOLN
INTRAMUSCULAR | Status: DC | PRN
  Administered 2019-04-07: 4 mg via INTRAVENOUS

## 2019-04-07 MED ORDER — DEXAMETHASONE SODIUM PHOSPHATE 10 MG/ML IJ SOLN
INTRAMUSCULAR | Status: DC | PRN
  Administered 2019-04-07: 10 mg via INTRAVENOUS

## 2019-04-07 MED ORDER — SODIUM CHLORIDE 0.9 % IV SOLN: INTRAVENOUS | Status: DC | PRN

## 2019-04-07 MED ORDER — PHENYLEPHRINE HCL (PRESSORS) 10 MG/ML IV SOLN
INTRAVENOUS | Status: DC | PRN
  Administered 2019-04-07: 100 ug via INTRAVENOUS

## 2019-04-07 MED ORDER — PROPOFOL 10 MG/ML IV BOLUS
INTRAVENOUS | Status: DC | PRN
  Administered 2019-04-07: 110 mg via INTRAVENOUS

## 2019-04-07 MED ORDER — ROCURONIUM BROMIDE 100 MG/10ML IV SOLN
INTRAVENOUS | Status: DC | PRN
  Administered 2019-04-07: 40 mg via INTRAVENOUS
  Administered 2019-04-07: 10 mg via INTRAVENOUS

## 2019-04-07 MED ORDER — SUCCINYLCHOLINE CHLORIDE 20 MG/ML IJ SOLN
INTRAMUSCULAR | Status: DC | PRN
  Administered 2019-04-07: 100 mg via INTRAVENOUS

## 2019-04-07 NOTE — Transfer of Care (Signed)
Immediate Anesthesia Transfer of Care Note  Patient: Darryl White  Procedure(s) Performed: VIDEO BRONCHOSCOPY WITH ENDOBRONCHIAL ULTRASOUND (N/A )  Patient Location: PACU  Anesthesia Type:General  Level of Consciousness: awake, alert  and oriented  Airway & Oxygen Therapy: Patient Spontanous Breathing and Patient connected to face mask oxygen  Post-op Assessment: Report given to RN and Post -op Vital signs reviewed and stable  Post vital signs: Reviewed and stable  Last Vitals:  Vitals Value Taken Time  BP 145/89 04/07/19 0845  Temp 36.1 C 04/07/19 0845  Pulse 66 04/07/19 0848  Resp 22 04/07/19 0848  SpO2 98 % 04/07/19 0848  Vitals shown include unvalidated device data.  Last Pain:  Vitals:   04/07/19 0845  TempSrc:   PainSc: 0-No pain         Complications: No apparent anesthesia complications

## 2019-04-07 NOTE — Anesthesia Preprocedure Evaluation (Signed)
Anesthesia Evaluation  Patient identified by MRN, date of birth, ID band Patient awake    Reviewed: Allergy & Precautions, H&P , NPO status , Patient's Chart, lab work & pertinent test results, reviewed documented beta blocker date and time   Airway Mallampati: III  TM Distance: >3 FB Neck ROM: full    Dental  (+) Teeth Intact   Pulmonary COPD,  COPD inhaler, former smoker,    Pulmonary exam normal        Cardiovascular Exercise Tolerance: Poor Normal cardiovascular exam+ dysrhythmias + pacemaker  Rhythm:regular Rate:Normal     Neuro/Psych negative neurological ROS  negative psych ROS   GI/Hepatic Neg liver ROS, hiatal hernia, GERD  Medicated,  Endo/Other  negative endocrine ROS  Renal/GU negative Renal ROS  negative genitourinary   Musculoskeletal   Abdominal   Peds  Hematology negative hematology ROS (+)   Anesthesia Other Findings Past Medical History: 2019: Arthritis     Comment:  dx of osteoarthritis and rhemautoid arthritis No date: Cancer ([place])     Comment:  lung 2019: Cataract     Comment:  left eye No date: Chicken pox No date: Collagen vascular disease (HCC) No date: Complete heart block (HCC)     Comment:  a. s/p MDT dual chamber PPM No date: COPD (chronic obstructive pulmonary disease) (HCC) No date: Diverticulitis No date: GERD (gastroesophageal reflux disease) 2015: Glaucoma     Comment:  bilateral eyes No date: History of hiatal hernia No date: History of stomach ulcers No date: Hyperlipidemia No date: Lymphocytic colitis No date: Orthostatic hypotension No date: Osteoarthritis No date: Pancreatitis No date: Paroxysmal atrial fibrillation (HCC) No date: Presence of permanent cardiac pacemaker Past Surgical History: No date: APPENDECTOMY No date: BACK SURGERY     Comment:  bone chip lasered No date: CARDIAC CATHETERIZATION No date: CATARACT EXTRACTION W/ INTRAOCULAR LENS IMPLANT;  Right 1990's: COLON SURGERY     Comment:  12" removed of large intestine No date: [place]: INSERT / REPLACE / REMOVE PACEMAKER No date: large intestine block No date: PACEMAKER INSERTION     Comment:  MDT dual chamber pacemaker 12/02/2018: [place]; N/A     Comment:  Procedure: PPM GENERATOR CHANGEOUT;  Surgeon: Deboraha Sprang, MD;  Location: [facility];  Service:               Cardiovascular;  Laterality: N/A; No date: TONSILLECTOMY AND ADENOIDECTOMY BMI    Body Mass Index: 26.97 kg/m     Reproductive/Obstetrics negative OB ROS                             Anesthesia Physical Anesthesia Plan  ASA: III  Anesthesia Plan: General ETT   Post-op Pain Management:    Induction:   PONV Risk Score and Plan:   Airway Management Planned:   Additional Equipment:   Intra-op Plan:   Post-operative Plan:   Informed Consent: I have reviewed the patients History and Physical, chart, labs and discussed the procedure including the risks, benefits and alternatives for the proposed anesthesia with the patient or authorized representative who has indicated his/her understanding and acceptance.     Dental Advisory Given  Plan Discussed with: CRNA  Anesthesia Plan Comments:         Anesthesia Quick Evaluation

## 2019-04-07 NOTE — Interval H&P Note (Signed)
History and Physical Interval Note:  04/07/2019 7:28 AM  Darryl White  has presented today for surgery, with the diagnosis of MEDIASTINAL ADENOPATHY.  The various methods of treatment have been discussed with the patient and family. After consideration of risks, benefits and other options for treatment, the patient has consented to  Procedure(s): [place] (N/A) as a surgical intervention.  The patient's history has been reviewed, patient examined, no change in status, stable for surgery.  I have reviewed the patient's chart and labs.  Questions were answered to the patient's satisfaction.     Vernard Gambles

## 2019-04-07 NOTE — Discharge Instructions (Addendum)
AMBULATORY SURGERY  DISCHARGE INSTRUCTIONS   1) The drugs that you were given will stay in your system until tomorrow so for the next 24 hours you should not:  A) Drive an automobile B) Make any legal decisions C) Drink any alcoholic beverage   2) You may resume regular meals tomorrow.  Today it is better to start with liquids and gradually work up to solid foods.  You may eat anything you prefer, but it is better to start with liquids, then soup and crackers, and gradually work up to solid foods.   3) Please notify your doctor immediately if you have any unusual bleeding, trouble breathing, redness and pain at the surgery site, drainage, fever, or pain not relieved by medication.    4) Additional Instructions:        Please contact your physician with any problems or Same Day Surgery at [phone], Monday through Friday 6 am to 4 pm, or [place] at [facility] number at [phone].AMBULATORY SURGERY  DISCHARGE INSTRUCTIONS   5) The drugs that you were given will stay in your system until tomorrow so for the next 24 hours you should not:  D) Drive an automobile E) Make any legal decisions F) Drink any alcoholic beverage   6) You may resume regular meals tomorrow.  Today it is better to start with liquids and gradually work up to solid foods.  You may eat anything you prefer, but it is better to start with liquids, then soup and crackers, and gradually work up to solid foods.   7) Please notify your doctor immediately if you have any unusual bleeding, trouble breathing, redness and pain at the surgery site, drainage, fever, or pain not relieved by medication.    8) Additional Instructions:        Please contact your physician with any problems or Same Day Surgery at [phone], Monday through Friday 6 am to 4 pm, or [place] at [facility] number at [phone].You may resume Eliquis tomorrow morning 2/16

## 2019-04-07 NOTE — Op Note (Signed)
PROCEDURE: ENDOBRONCHIAL ULTRASOUND   PROCEDURE DATE: 04/07/2019  TIME:  NAME:  Darryl White  DOB:[birth year]  MRN: [id number] LOC:  ARPO/None    HOSP DAY: @LENGTHOFSTAYDAYS @ CODE STATUS: Full Code Status History    Date Active Date Inactive Code Status Order ID Comments User Context   12/02/2018 0941 12/02/2018 1406 Full Code [id number]  Deboraha Sprang, MD Inpatient   10/12/2014 0951 10/13/2014 1954 Full Code [id number]  Skeet Latch, MD ED   Advance Care Planning Activity       Indications/Preliminary Diagnosis: Mediastinal adenopathy, precarinal high SUV consistent with malignancy.  Consent: (Place X beside choice/s below)  The benefits, risks and possible complications of the procedure were        explained to:  _X__ patient  ___ patient's family  ___ other:___________  who verbalized understanding and gave:  ___ verbal  ___ written  _X__ verbal and written  ___ telephone  ___ other:________ consent.      Unable to obtain consent; procedure performed on emergent basis.   X Benefits, limitations and potential complications of the procedure were discussed with the patient/family  including, but not limited to bleeding, hemoptysis, respiratory failure requiring intubation and/or prolongued mechanical ventilation, infection, pneumothorax (collapse of lung) requiring chest tube placement, stroke from air embolism or even death.   Patient agreed to proceed with bronchoscopy with endobronchial ultrasound.  Operator: Renold Don, MD Assistant/Scrub: Sullivan Lone, RRT Anesthesiologist/CRNA: Vashti Hey, MD/Mark Neldon Mc, CRNA ROSE available: Yes, [facility]  ANESTHESIA: General endotracheal    Insertion Route (Place X beside choice below)   Nasal   Oral  X Endotracheal Tube   Tracheostomy   INTRAPROCEDURE MEDICATIONS: SEE ANESTHESIOLOGY RECORDS   PROCEDURE DETAILS: The patient was taken to Procedure Room to (Bronchoscopy Suite) where appropriate timeout was performed and  correct patient, name and laterality confirmed.  Patient was inducted under general anesthesia by anesthesiologist and CRNA.  Patient was intubated with a #9 ET tube.  Portex adapter was placed on the ET tube flange.  Once patient had achieved adequate anesthesia the Olympus therapeutic video bronchoscope was advanced through the existing Portex adapter.  The airway was thoroughly examined with an anatomic tour with standard therapeutic video bronchoscope.  No endobronchial lesions were noted.  Very minimal secretions noted.  Once this was completed, the bronchoscope was removed exchanged for the Olympus endobronchial ultrasound scope was advanced through the Portex adapter into the airway.  Examination of the mediastinum showed a 3-1/2 cm x 2 cm precarinal node/mass with central necrosis.  Once this was identified an Martin shot II 21-gauge EBUS needle was utilized to sample the lesion.  Sampling was difficult due to cartilage rings deflecting the needle however, multiple passes were possible once redirection was performed.  ROSE showed atypical cells in a background of necrosis and blood.  After several passes malignant cells were identified.  Remainder of the sampling was placed in CytoLyt for cytologic/histologic evaluation.  A total of 16 passes were performed.  Once this was completed the EBUS scope was removed and exchanged again for the therapeutic bronchoscope and the airway was thoroughly examined.  Lavage of once amounts of blood was done until cleared.  Excellent hemostasis.  She received then 1% lidocaine via bronchial lavage for a total of 9 mL.  At this point bronchoscope was removed.  Procedure was terminated the patient was allowed to emerge from general anesthesia.  He was extubated and taken to the PACU in satisfactory condition.  No overt complications  noted.  SPECIMENS (Sites): (Place X beside choice below)  Specimens Description   No Specimens Obtained     [place]    Lavage     Biopsies   X Fine Needle Aspirates X 16, precarinal space (central)   Brushings    Sputum    FINDINGS:   Large precarinal adenopathy with central necrosis, carcinoma until proven otherwise.  No endobronchial lesions.  ESTIMATED BLOOD LOSS: Less than 5 mL COMPLICATIONS/RESOLUTION: No overt complications noted   IMPRESSION:POST-PROCEDURE DX:   Mediastinal adenopathy with high SUV, endobronchial ultrasound findings consistent with malignancy.  RECOMMENDATION/PLAN:   Follow-up with oncology as scheduled.  Follow-up with me on 29 April 2019, as scheduled.  We will follow up on pathology reports.    Renold Don, MD [facility]

## 2019-04-07 NOTE — Anesthesia Procedure Notes (Signed)
Procedure Name: Intubation Date/Time: 04/07/2019 7:48 AM Performed by: Nelda Marseille, CRNA Pre-anesthesia Checklist: Patient identified, Patient being monitored, Timeout performed, Emergency Drugs available and Suction available Patient Re-evaluated:Patient Re-evaluated prior to induction Oxygen Delivery Method: Circle system utilized Preoxygenation: Pre-oxygenation with 100% oxygen Induction Type: IV induction Ventilation: Mask ventilation without difficulty Laryngoscope Size: Mac, 3 and McGraph Grade View: Grade II Tube type: Oral Tube size: 9.0 mm Number of attempts: 1 Airway Equipment and Method: Stylet and Video-laryngoscopy Placement Confirmation: ETT inserted through vocal cords under direct vision,  positive ETCO2 and breath sounds checked- equal and bilateral Secured at: 21 cm Tube secured with: Tape Dental Injury: Teeth and Oropharynx as per pre-operative assessment

## 2019-04-08 NOTE — Anesthesia Postprocedure Evaluation (Signed)
Anesthesia Post Note  Patient: Darryl White  Procedure(s) Performed: VIDEO BRONCHOSCOPY WITH ENDOBRONCHIAL ULTRASOUND (N/A )  Patient location during evaluation: PACU Anesthesia Type: General Level of consciousness: awake and alert Pain management: pain level controlled Vital Signs Assessment: post-procedure vital signs reviewed and stable Respiratory status: spontaneous breathing, nonlabored ventilation, respiratory function stable and patient connected to nasal cannula oxygen Cardiovascular status: blood pressure returned to baseline and stable Postop Assessment: no apparent nausea or vomiting Anesthetic complications: no     Last Vitals:  Vitals:   04/07/19 0910 04/07/19 0918  BP: 129/70 138/66  Pulse: 65 65  Resp: 17 18  Temp: (!) 36.1 C (!) 36.1 C  SpO2: 98% 96%    Last Pain:  Vitals:   04/08/19 0836  TempSrc:   PainSc: 0-No pain                 Molli Barrows

## 2019-04-09 ENCOUNTER — Telehealth: Payer: Self-pay | Admitting: Pulmonary Disease

## 2019-04-09 NOTE — Telephone Encounter (Signed)
Spoke to patient, blisters developed under the tongue today 2 days after procedure.  He had been using some Magic mouthwash for thrush that was given to him by his dentist.  He felt that his mouth felt worse after the Magic mouthwash.  He may be reacting to ingredients in this mouthwash.  He was advised to discontinue Magic mouthwash and he may use warm salt water to rinse and gargle with.  He was advised to call his dentist and let them know about his reaction to the Magic mouthwash.  Recommended Biotene or similar mouthwash if needed.  Nothing further to do.

## 2019-04-09 NOTE — Telephone Encounter (Signed)
Called and let pt know we would get the drs recommendations.   DG can you please advise. Thank you

## 2019-04-10 ENCOUNTER — Ambulatory Visit: Payer: Medicare PPO

## 2019-04-10 DIAGNOSIS — R918 Other nonspecific abnormal finding of lung field: Secondary | ICD-10-CM | POA: Insufficient documentation

## 2019-04-10 DIAGNOSIS — C3431 Malignant neoplasm of lower lobe, right bronchus or lung: Secondary | ICD-10-CM | POA: Insufficient documentation

## 2019-04-11 ENCOUNTER — Other Ambulatory Visit: Payer: Self-pay

## 2019-04-11 ENCOUNTER — Encounter (INDEPENDENT_AMBULATORY_CARE_PROVIDER_SITE_OTHER): Payer: Self-pay

## 2019-04-11 ENCOUNTER — Encounter: Payer: Self-pay | Admitting: Oncology

## 2019-04-11 ENCOUNTER — Inpatient Hospital Stay: Payer: Medicare PPO | Admitting: Oncology

## 2019-04-11 ENCOUNTER — Telehealth (INDEPENDENT_AMBULATORY_CARE_PROVIDER_SITE_OTHER): Payer: Self-pay

## 2019-04-11 VITALS — BP 127/86 | HR 70 | Temp 96.8°F | Resp 18 | Wt 173.0 lb

## 2019-04-11 DIAGNOSIS — R911 Solitary pulmonary nodule: Secondary | ICD-10-CM | POA: Diagnosis not present

## 2019-04-11 DIAGNOSIS — C3491 Malignant neoplasm of unspecified part of right bronchus or lung: Secondary | ICD-10-CM

## 2019-04-11 DIAGNOSIS — Z7189 Other specified counseling: Secondary | ICD-10-CM | POA: Diagnosis not present

## 2019-04-11 NOTE — Telephone Encounter (Signed)
Spoke with the patient and he is now scheduled with Dr. Lucky Cowboy for a port placement on 04/17/19 with a 9:45 am arrival time to the [facility]. Patient will do covid testing on 04/15/19 between 12:30-2:30 pm at the [facility]. Pre-procedure instructions were discussed and will be mailed to the patient.

## 2019-04-11 NOTE — Progress Notes (Signed)
Hematology/Oncology follow up note [facility] Telephone:([phone] Fax:([phone]) [phone]   Patient Care Team: Pleas Koch, NP as PCP - General (Internal Medicine) Deboraha Sprang, MD as Consulting Physician (Cardiology) Marlowe Sax, MD as Referring Physician (Internal Medicine) Birder Robson, MD as Referring Physician (Ophthalmology) Oneta Rack, MD as Referring Physician (Dermatology) Salli Real, DDS as Referring Physician (Dentistry) Telford Nab, RN as Registered Nurse  REFERRING PROVIDER: Pleas Koch, NP  CHIEF COMPLAINTS/REASON FOR VISIT:  Evaluation of lung mass  HISTORY OF PRESENTING ILLNESS:   Darryl White is a  79 y.o.  male with PMH listed below was seen in consultation at the request of  Pleas Koch, NP  for evaluation of lung mass April 2020 patient was evaluated by radiation oncology Dr. Baruch Gouty for right lung nodules concerning for progressive malignancy.   Patient had progressively enlarging media right lower lobe which was previously treated in April 2020 empirically with SBRT.  Biopsy was not done due to poor lung function secondary to COPD/moderate fibrosis. Patient also had a right upper lobe nodule being closely watched, and was found to have progressively enlarged on 10/29/2018 CT chest. 02/27/2019 patient had another CT done which showed marked progression of precarinal lymph node and a continued progression of peripheral right upper lobe pulmonary nodule. 03/20/2019 PET scan showed hypermetabolic right lower paratracheal and subcarinal adenopathy with hypermetabolic right upper lobe subpleural nodule, or significantly worsened compared to prior CT scan. Patient also had 1.2 x 1 cm left upper lobe confluent groundglass densities with low metabolic activity. Patient was referred to establish care with oncology for further evaluation and management.  Patient reports chronic shortness of breath  with exertion.  He follows up with pulmonology and was recently seen by Dr. Patsey Berthold.  Denies any hemoptysis,.  He has a history of extensive smoking history, 67-pack-year, quit in 2019. He lives with wife at home.  INTERVAL HISTORY Darryl White is a 79 y.o. male who has above history reviewed by me today presents for follow up visit for management of lung cancer Problems and complaints are listed below: During the interval, patient has had EBUS biopsy.  He presented to discuss results and management plan. Patient was accompanied by his wife. He denies any new complaints.  He has chronic shortness of breath with exertion.  Feels that he needs to rest after walking short distance.  He requests handicap parking permit application.  Review of Systems  Constitutional: Negative for appetite change, chills, fatigue, fever and unexpected weight change.  HENT:   Negative for hearing loss and voice change.   Eyes: Negative for eye problems and icterus.  Respiratory: Positive for cough and shortness of breath. Negative for chest tightness.   Cardiovascular: Negative for chest pain and leg swelling.  Gastrointestinal: Negative for abdominal distention and abdominal pain.  Endocrine: Negative for hot flashes.  Genitourinary: Negative for difficulty urinating, dysuria and frequency.   Musculoskeletal: Negative for arthralgias.  Skin: Negative for itching and rash.  Neurological: Negative for light-headedness and numbness.  Hematological: Negative for adenopathy. Does not bruise/bleed easily.  Psychiatric/Behavioral: Negative for confusion.    MEDICAL HISTORY:  Past Medical History:  Diagnosis Date  . Arthritis 2019   dx of osteoarthritis and rhemautoid arthritis  . Cancer ([place])    lung  . Cataract 2019   left eye  . Chicken pox   . Collagen vascular disease ([place])   . 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 s/p  MDT dual chamber PPM  . COPD (chronic obstructive pulmonary disease) ([place])   .  Diverticulitis   . GERD (gastroesophageal reflux disease)   . 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 History of hiatal hernia   . History of stomach ulcers   . Hyperlipidemia   . Lymphocytic colitis   . Orthostatic hypotension   . Osteoarthritis   . Pancreatitis   . Paroxysmal atrial fibrillation (HCC)   . Presence of permanent cardiac pacemaker     SURGICAL HISTORY: Past Surgical History:  Procedure Laterality Date  . APPENDECTOMY    . BACK SURGERY     bone chip lasered  . CARDIAC CATHETERIZATION    . CATARACT EXTRACTION W/ INTRAOCULAR LENS IMPLANT Right   . COLON SURGERY  1990's   12" removed of large intestine  . EYE SURGERY    . INSERT / REPLACE / [place]  . large intestine block    . PACEMAKER INSERTION     MDT dual chamber pacemaker  . PPM GENERATOR CHANGEOUT N/A 12/02/2018   Procedure: PPM GENERATOR CHANGEOUT;  Surgeon: Deboraha Sprang, MD;  Location: [place] [facility];  Service: Cardiovascular;  Laterality: N/A;  . TONSILLECTOMY AND ADENOIDECTOMY    . VIDEO BRONCHOSCOPY WITH ENDOBRONCHIAL ULTRASOUND N/A 04/07/2019   Procedure: VIDEO BRONCHOSCOPY WITH ENDOBRONCHIAL ULTRASOUND;  Surgeon: Tyler Pita, MD;  Location: [facility];  Service: Pulmonary;  Laterality: N/A;    SOCIAL HISTORY: Social History   Socioeconomic History  . Marital status: Married    Spouse name: Not on file  . Number of children: 2  . Years of education: Not on file  . Highest education level: Not on file  Occupational History  . Occupation: Retired  Tobacco Use  . Smoking status: Former Smoker    Packs/day: 1.00    Years: 60.00    Pack years: 60.00    Types: Cigarettes    Quit date: 04/29/2017    Years since quitting: 1.9  . Smokeless tobacco: Never Used  Substance and Sexual Activity  . Alcohol use: Yes    Alcohol/week: 5.0 standard drinks    Types: 5 Glasses of wine per week  . Drug use: No  . Sexual activity: Never  Other Topics Concern  . Not on file  Social  History Narrative   Married.   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      Does not have a living will.   Desires CPR, does not want prolonged life support if futile.   Social Determinants of Health   Financial Resource Strain: Low Risk   . Difficulty of Paying Living Expenses: Not hard at all  Food Insecurity: No Food Insecurity  . Worried About Charity fundraiser in the Last Year: Never true  . Ran Out of Food in the Last Year: Never true  Transportation Needs: No Transportation Needs  . Lack of Transportation (Medical): No  . Lack of Transportation (Non-Medical): No  Physical Activity: Sufficiently Active  . Days of Exercise per Week: 7 days  . Minutes of Exercise per Session: 60 min  Stress: No Stress Concern Present  . Feeling of Stress : Not at all  Social Connections:   . Frequency of Communication with Friends and Family: Not on file  . Frequency of Social Gatherings with Friends and Family: Not on file  . Attends Religious Services: Not on file  . Active Member of Clubs or Organizations: Not on file  . Attends  Club or Organization Meetings: Not on file  . Marital Status: Not on file  Intimate Partner Violence: Not At Risk  . Fear of Current or Ex-Partner: No  . Emotionally Abused: No  . Physically Abused: No  . Sexually Abused: No    FAMILY HISTORY: Family History  Problem Relation Age of Onset  . Lung cancer Mother   . Heart disease Father   . Alzheimer's disease Father     ALLERGIES:  is allergic to sulfa antibiotics and penicillins.  MEDICATIONS:  Current Outpatient Medications  Medication Sig Dispense Refill  . albuterol (PROAIR HFA) 108 (90 Base) MCG/ACT inhaler Inhale 1-2 puffs into the lungs every 6 (six) hours as needed for wheezing or shortness of breath. 1 Inhaler 5  . antiseptic oral rinse (BIOTENE) LIQD 15 mLs by Mouth Rinse route as needed for dry mouth.    . brimonidine (ALPHAGAN) 0.15 % ophthalmic solution Place 1 drop into both  eyes 2 (two) times daily.     . Budeson-Glycopyrrol-Formoterol (BREZTRI AEROSPHERE) 160-9-4.8 MCG/ACT AERO Inhale 2 puffs into the lungs 2 (two) times daily. 10.7 g 6  . Cholecalciferol (VITAMIN D3) 2000 UNITS capsule Take 2,000 Units by mouth daily with lunch.     . dextromethorphan-guaiFENesin (MUCINEX DM) 30-600 MG 12hr tablet Take 1 tablet by mouth 2 (two) times daily as needed (congestion/cough).     Marland Kitchen ELIQUIS 5 MG TABS tablet TAKE 1 TABLET BY MOUTH TWICE DAILY (Patient taking differently: Take 5 mg by mouth 2 (two) times daily. ) 60 tablet 6  . famotidine (PEPCID) 20 MG tablet Take 20 mg by mouth 2 (two) times daily.    . fenofibrate (TRICOR) 145 MG tablet Take 1 tablet (145 mg total) by mouth daily. 90 tablet 1  . folic acid (FOLVITE) 1 MG tablet Take 1 mg by mouth daily.    Marland Kitchen gabapentin (NEURONTIN) 100 MG capsule Take 100 mg by mouth 3 (three) times daily.    . methotrexate 2.5 MG tablet Take 12.5 mg by mouth every Sunday.     . sodium chloride (OCEAN) 0.65 % SOLN nasal spray Place 1 spray into both nostrils at bedtime as needed for congestion.     . traMADol (ULTRAM) 50 MG tablet Take 25 mg by mouth at bedtime as needed for moderate pain.     . magic mouthwash SOLN Take 5 mLs by mouth 4 (four) times daily.     No current facility-administered medications for this visit.     PHYSICAL EXAMINATION: ECOG PERFORMANCE STATUS: 1 - Symptomatic but completely ambulatory Vitals:   04/11/19 1030  BP: 127/86  Pulse: 70  Resp: 18  Temp: (!) 96.8 F (36 C)  SpO2: 97%   Filed Weights   04/11/19 1030  Weight: 173 lb (78.5 kg)    Physical Exam Constitutional:      General: He is not in acute distress.    Comments: Patient walks independently  HENT:     Head: Normocephalic and atraumatic.  Eyes:     General: No scleral icterus. Cardiovascular:     Rate and Rhythm: Normal rate and regular rhythm.     Heart sounds: Normal heart sounds.  Pulmonary:     Effort: Pulmonary effort is  normal. No respiratory distress.     Breath sounds: No wheezing.     Comments: Severely decreased breath sound bilaterally. Abdominal:     General: Bowel sounds are normal. There is no distension.     Palpations: Abdomen is soft.  Musculoskeletal:        General: No deformity. Normal range of motion.     Cervical back: Normal range of motion and neck supple.  Skin:    General: Skin is warm and dry.     Findings: No erythema or rash.  Neurological:     Mental Status: He is alert and oriented to person, place, and time. Mental status is at baseline.     Cranial Nerves: No cranial nerve deficit.     Coordination: Coordination normal.  Psychiatric:        Mood and Affect: Mood normal.     LABORATORY DATA:  I have reviewed the data as listed Lab Results  Component Value Date   WBC 6.2 03/13/2019   HGB 14.7 03/13/2019   HCT 43.7 03/13/2019   MCV 106.6 (H) 03/13/2019   PLT 186.0 03/13/2019   Recent Labs    06/18/18 1016 06/18/18 1016 11/28/18 1256 11/28/18 1256 02/27/19 1023 03/13/19 0806 04/03/19 1109  NA 137   < > 134*  --   --  134* 133*  K 4.8   < > 4.4  --   --  4.6 4.8  CL 103   < > 101  --   --  102 101  CO2 27   < > 26  --   --  26 24  GLUCOSE 109*   < > 117*  --   --  105* 108*  BUN 22   < > 19  --   --  18 18  CREATININE 1.32*   < > 1.23   < > 1.20 1.18 1.24  CALCIUM 9.5   < > 9.4  --   --  9.4 9.3  GFRNONAA 52*  --  56*  --   --   --  55*  GFRAA 60*  --  >60  --   --   --  >60  PROT 7.0  --   --   --   --  6.9 7.0  ALBUMIN 4.2  --   --   --   --  4.2 4.0  AST 65*  --   --   --   --  53* 53*  ALT 47*  --   --   --   --  33 31  ALKPHOS 49  --   --   --   --  71 60  BILITOT 0.8  --   --   --   --  0.9 1.2   < > = values in this interval not displayed.   Iron/TIBC/Ferritin/ %Sat No results found for: IRON, TIBC, FERRITIN, IRONPCTSAT    RADIOGRAPHIC STUDIES: I have personally reviewed the radiological images as listed and agreed with the findings in the  report.  CT Head W Wo Contrast  Result Date: 04/04/2019 CLINICAL DATA:  Lung mass staging.  No CNS symptoms per report EXAM: CT HEAD WITHOUT AND WITH CONTRAST TECHNIQUE: Contiguous axial images were obtained from the base of the skull through the vertex without and with intravenous contrast CONTRAST:  63mL OMNIPAQUE IOHEXOL 300 MG/ML  SOLN COMPARISON:  None. FINDINGS: Brain: No evidence of acute infarction, hemorrhage, hydrocephalus, extra-axial collection or mass lesion/mass effect. No abnormal intracranial enhancement Vascular: Major vessels are enhancing Skull: Negative for fracture or bone lesion Sinuses/Orbits: Negative IMPRESSION: Negative.  No evidence of metastatic disease. Electronically Signed   By: Monte Fantasia M.D.   On: 04/04/2019 10:06   NM PET Image Restag (  PS) Skull Base To Thigh  Result Date: 03/20/2019 CLINICAL DATA:  Subsequent treatment strategy for non-small cell lung cancer. EXAM: NUCLEAR MEDICINE PET SKULL BASE TO THIGH TECHNIQUE: 9.3 mCi F-18 FDG was injected intravenously. Full-ring PET imaging was performed from the skull base to thigh after the radiotracer. CT data was obtained and used for attenuation correction and anatomic localization. Fasting blood glucose: 97 mg/dl COMPARISON:  Multiple exams, including CT chest 02/27/2019 and prior PET-CT from 02/18/2018 FINDINGS: Mediastinal blood pool activity: SUV max 2.7 Liver activity: SUV max NA NECK: Bilateral hypermetabolic posterior parotid nodules are present, partially obscured on the CT data due to streak artifact from the patient's fillings. The left posterior nodule has a maximum SUV of 6.6 (formerly 10.3) and the right posterior parotid nodule has maximum SUV of 7.9 (formerly 9.8). Incidental CT findings: Bilateral common carotid atherosclerotic calcification. CHEST: The right paratracheal node anterior to the carina measures 2.3 cm in short axis on image 89/3 (formerly measured at 2.1 cm on 02/27/2019 and about 0.7 cm on  02/18/2018) with maximum SUV 10.8, compatible with malignancy. The small lymph nodes in this vicinity on the prior PET-CT were not hypermetabolic. The peripheral right upper lobe subpleural nodule on image 93/3 measures 1.4 by 2.4 cm, essentially stable from 02/27/2019 but increased in size from the 02/18/2018 exam. Maximum SUV 12.4, previously 4.3 on 02/18/2018. A subcarinal lymph node measuring 1.2 cm in short axis on image 96/3 is stable from 02/27/2019, and has a maximum SUV of 4.6 (previously not hypermetabolic). Along the confluent interstitial opacity in the left upper lobe adjacent to the major fissure, there is a 1.2 by 1.0 cm nodule on image 100/3 which is roughly stable from the 02/27/2019 exam, with maximum SUV 3.5, suspicious for malignancy. There is some hazy accentuated metabolic activity throughout the adjacent ground-glass opacity in the left upper lobe and left lower lobe. Along the medial margin of the right pectoralis minor muscle there is a small focus of accentuated metabolic activity which may be from an adjacent lymph node, measuring with maximum SUV of 3.5. It is possible that this simply represents physiologic muscular activity. Incidental CT findings: Pacer/defibrillator device noted. Coronary, aortic arch, and branch vessel atherosclerotic vascular disease. Mild cardiomegaly. Small type 1 hiatal hernia. Peripheral interstitial accentuation in the lungs favors fibrosis. Extensive centrilobular emphysema. ABDOMEN/PELVIS: No significant abnormal hypermetabolic activity in this region. Incidental CT findings: Aortoiliac atherosclerotic vascular disease. Exophytic hyperdense 1.2 by 0.9 cm lesion of the left kidney lower pole is photopenic and likely a small complex but benign cyst. Scattered colonic diverticula. SKELETON: No significant abnormal hypermetabolic activity in this region. Incidental CT findings: none IMPRESSION: 1. Hypermetabolic right lower paratracheal and subcarinal adenopathy  with hypermetabolic right upper lobe subpleural nodule, all significantly worsened compared to the prior PET-CT, but roughly similar in size to the chest CT from 3 weeks ago. 2. A 1.2 by 1.0 cm nodule in the left upper lobe adjacent to confluent ground-glass densities is stable in size from 3 weeks ago but new from 02/18/2018, with maximum SUV of 3.5, potentially from metastatic disease or confluent inflammatory condition. 3. A tiny focus of hypermetabolic activity along the right upper pectoralis minor muscle medial margin could be from a small hypermetabolic lymph node or simply due to physiologic activity in the muscle. 4. Bilateral parotid nodules, moderately reduced in activity compared to the prior exam, appearance favoring Warthin's tumors. 5. Other imaging findings of potential clinical significance: Aortic Atherosclerosis (ICD10-I70.0) and Emphysema (ICD10-J43.9). Coronary  atherosclerosis with mild cardiomegaly. Small type 1 hiatal hernia. Suspected fibrosis in the lungs. Left kidney lower pole hyperdense lesion is likely a complex cyst. Electronically Signed   By: Van Clines M.D.   On: 03/20/2019 12:08      ASSESSMENT & PLAN:  1. Malignant neoplasm of right lung, unspecified part of lung ([place])   2. Goals of care, counseling/discussion    #Right lung cancer.  Clinically T1c N2 M0. CT head is negative. MRI is contraindicated due to pace marker.  PET scan images were reviewed and discussed with patient. Also discussed with patient about pathology report Pre-carinal lymph node EBUS guided FNA showed malignancy, metastatic high-grade carcinoma. I discussed with pathology via secure chat.  Additional stains are being performed to further classify the tumor and addendum will follow. - Small cell vs NSCLC  I also discussed with patient that the left upper lobe nodule 1.2 x 1 cm which has a low-grade FDG activity on the PET scan, possible malignancy, a secondary primary vs metastatic lesion  from right side.   Discussed with patient that further treatments will be pending on the final pathology reports. If this is small cell histology, will recommend concurrent chemoradiation with carboplatin etoposide if all his disease can fit into 1 radiation field, followed by Tecentriq. If this is non-small cell lung cancer histology, will recommend concurrent chemoradiation with carboplatin, Taxol followed by 1 year of durvalumab treatments treating presumed stage IIIb disease.  Left upper lobe nodule may need to be treated with SBRT.  I explained to the patient the risks and benefits of chemotherapy including all but not limited to infusion reaction, hair loss, hearing loss, mouth sore, nausea, vomiting, low blood counts, bleeding, heart failure, kidney failure and risk of life threatening infection and even death, secondary malignancy etc.  I discussed the mechanism of action and rationale of using immunotherapy.  The goal of therapy is palliative; and length of treatments are likely ongoing/based upon the results of the scans. Discussed the potential side effects of immunotherapy including but not limited to diarrhea; skin rash; respiratory failure, kidney failure, mental status change, elevated LFTs/liver failure,endocrine abnormalities, acute deterioration  and even death,etc. patient voices understanding and agrees with proceeding with treatment.  # Chemotherapy education; referred to vascular surgery for medi port placement. Antiemetics-Compazine; EMLA cream sent to pharmacy Patient has appointment to see radiation oncology next week.  Orders Placed This Encounter  Procedures  . Ambulatory referral to Vascular Surgery    Referral Priority:   Routine    Referral Type:   Surgical    Referral Reason:   Specialty Services Required    Referred to Provider:   Algernon Huxley, MD    Requested Specialty:   Vascular Surgery    Number of Visits Requested:   1    All questions were answered. The  patient knows to call the clinic with any problems questions or concerns.  Return of visit: To be determined. Thank you for this kind referral and the opportunity to participate in the care of this patient. A copy of today's note is routed to referring provider     Earlie Server, MD, PhD Hematology Oncology [facility] at [facility] Pager- [phone] 04/11/2019

## 2019-04-11 NOTE — Progress Notes (Signed)
Patient here for follow up. No concerns voiced.  °

## 2019-04-14 ENCOUNTER — Inpatient Hospital Stay: Payer: Medicare PPO

## 2019-04-14 LAB — CYTOLOGY - NON PAP

## 2019-04-15 ENCOUNTER — Encounter: Payer: Self-pay | Admitting: Oncology

## 2019-04-15 ENCOUNTER — Ambulatory Visit
Admission: RE | Admit: 2019-04-15 | Discharge: 2019-04-15 | Disposition: A | Discharge status: Home / Self Care | Payer: Medicare PPO | Source: Ambulatory Visit | Attending: Radiation Oncology | Admitting: Radiation Oncology

## 2019-04-15 ENCOUNTER — Other Ambulatory Visit: Payer: Self-pay

## 2019-04-15 ENCOUNTER — Other Ambulatory Visit
Admission: RE | Admit: 2019-04-15 | Discharge: 2019-04-15 | Disposition: A | Discharge status: Home / Self Care | Payer: Medicare PPO | Source: Ambulatory Visit | Attending: Vascular Surgery | Admitting: Vascular Surgery

## 2019-04-15 ENCOUNTER — Inpatient Hospital Stay (HOSPITAL_BASED_OUTPATIENT_CLINIC_OR_DEPARTMENT_OTHER): Payer: Medicare PPO | Admitting: Oncology

## 2019-04-15 ENCOUNTER — Encounter: Payer: Self-pay | Admitting: *Deleted

## 2019-04-15 VITALS — BP 126/80 | HR 88 | Temp 95.8°F | Resp 18 | Wt 173.3 lb

## 2019-04-15 DIAGNOSIS — Z01812 Encounter for preprocedural laboratory examination: Secondary | ICD-10-CM | POA: Diagnosis present

## 2019-04-15 DIAGNOSIS — C3490 Malignant neoplasm of unspecified part of unspecified bronchus or lung: Secondary | ICD-10-CM | POA: Diagnosis not present

## 2019-04-15 DIAGNOSIS — R911 Solitary pulmonary nodule: Secondary | ICD-10-CM | POA: Diagnosis not present

## 2019-04-15 DIAGNOSIS — J439 Emphysema, unspecified: Secondary | ICD-10-CM

## 2019-04-15 DIAGNOSIS — Z7189 Other specified counseling: Secondary | ICD-10-CM | POA: Diagnosis not present

## 2019-04-15 DIAGNOSIS — Z20822 Contact with and (suspected) exposure to covid-19: Secondary | ICD-10-CM | POA: Insufficient documentation

## 2019-04-15 DIAGNOSIS — C3431 Malignant neoplasm of lower lobe, right bronchus or lung: Secondary | ICD-10-CM | POA: Diagnosis present

## 2019-04-15 DIAGNOSIS — Z7901 Long term (current) use of anticoagulants: Secondary | ICD-10-CM

## 2019-04-15 DIAGNOSIS — R918 Other nonspecific abnormal finding of lung field: Secondary | ICD-10-CM | POA: Diagnosis not present

## 2019-04-15 HISTORY — DX: Malignant neoplasm of unspecified part of unspecified bronchus or lung: C34.90

## 2019-04-15 MED ORDER — ONDANSETRON HCL 8 MG PO TABS: 8.0000 mg | ORAL_TABLET | Freq: Two times a day (BID) | ORAL | 1 refills | Status: AC | PRN

## 2019-04-15 MED ORDER — PROCHLORPERAZINE MALEATE 10 MG PO TABS: 10.0000 mg | ORAL_TABLET | Freq: Four times a day (QID) | ORAL | 1 refills | Status: DC | PRN

## 2019-04-15 MED ORDER — CHLORHEXIDINE GLUCONATE 0.12 % MT SOLN: 15.0000 mL | Freq: Two times a day (BID) | OROMUCOSAL | 0 refills | Status: DC

## 2019-04-15 MED ORDER — DEXAMETHASONE 4 MG PO TABS: 8.0000 mg | ORAL_TABLET | Freq: Every day | ORAL | 1 refills | Status: DC

## 2019-04-15 MED ORDER — LIDOCAINE-PRILOCAINE 2.5-2.5 % EX CREA: TOPICAL_CREAM | CUTANEOUS | 3 refills | Status: AC

## 2019-04-15 NOTE — Progress Notes (Signed)
  Oncology Nurse Navigator Documentation  Navigator Location: [facility] (04/15/19 1300)   )Navigator Encounter Type: Follow-up Appt (04/15/19 1300)     Confirmed Diagnosis Date: 04/14/19 (04/15/19 1300)               Patient Visit Type: MedOnc (04/15/19 1300) Treatment Phase: Pre-Tx/Tx Discussion (04/15/19 1300) Barriers/Navigation Needs: Coordination of Care;Education (04/15/19 1300) Education: Newly Diagnosed Cancer Education;Understanding Cancer/ Treatment Options (04/15/19 1300) Interventions: Coordination of Care;Education (04/15/19 1300)   Coordination of Care: Appts;Chemo (04/15/19 1300) Education Method: Verbal;Written (04/15/19 1300)      Acuity: Level 2-Minimal Needs (1-2 Barriers Identified) (04/15/19 1300)    met with patient and his wife during follow visit with Dr. Tasia Catchings to discuss final pathology results and treatment plan. All questions answered during visit. Pt given resources regarding diagnosis and supportive services available. Reviewed upcoming appts. Pt instructed to call with any further questions or needs. Pt verbalized understanding.      Time Spent with Patient: 45 (04/15/19 1300)

## 2019-04-15 NOTE — Progress Notes (Signed)
Hematology/Oncology follow up note [facility] [phone] [phone]   Patient Care Team: Pleas Koch, NP as PCP - General (Internal Medicine) Deboraha Sprang, MD as Consulting Physician (Cardiology) Marlowe Sax, MD as Referring Physician (Internal Medicine) Birder Robson, MD as Referring Physician (Ophthalmology) Oneta Rack, MD as Referring Physician (Dermatology) Salli Real, DDS as Referring Physician (Dentistry) Telford Nab, RN as Registered Nurse  REFERRING PROVIDER: Pleas Koch, NP  CHIEF COMPLAINTS/REASON FOR VISIT:  Follow-up for management of lung cancer  HISTORY OF PRESENTING ILLNESS:   Darryl White is a  79 y.o.  male with PMH listed below was seen in consultation at the request of  Pleas Koch, NP  for evaluation of lung mass April 2020 patient was evaluated by radiation oncology Dr. Baruch Gouty for right lung nodules concerning for progressive malignancy.   Patient had progressively enlarging media right lower lobe which was previously treated in April 2020 empirically with SBRT.  Biopsy was not done due to poor lung function secondary to COPD/moderate fibrosis. Patient also had a right upper lobe nodule being closely watched, and was found to have progressively enlarged on 10/29/2018 CT chest. 02/27/2019 patient had another CT done which showed marked progression of precarinal lymph node and a continued progression of peripheral right upper lobe pulmonary nodule. 03/20/2019 PET scan showed hypermetabolic right lower paratracheal and subcarinal adenopathy with hypermetabolic right upper lobe subpleural nodule, or significantly worsened compared to prior CT scan. Patient also had 1.2 x 1 cm left upper lobe confluent groundglass densities with low metabolic activity. Patient was referred to establish care with oncology for further evaluation and management.  Patient reports chronic  shortness of breath with exertion.  He follows up with pulmonology and was recently seen by Dr. Patsey Berthold.  Denies any hemoptysis,.  He has a history of extensive smoking history, 67-pack-year, quit in 2019. He lives with wife at home.  # PET scan images were reviewed and discussed with patient. Also discussed with patient about pathology report Pre-carinal lymph node EBUS guided FNA showed malignancy, metastatic high-grade carcinoma. I discussed with pathology via secure chat.  Additional stains are being performed to further classify the tumor and addendum will follow. - Small cell vs NSCLC   INTERVAL HISTORY Darryl White is a 79 y.o. male who has above history reviewed by me today presents for follow up visit for management of lung cancer Problems and complaints are listed below: Patient's final pathology came back as poorly differentiated high-grade carcinoma, negative CD45, TTF-1, synaptophysin, negative for p40.  There are patchy areas with focal weak staining for CD56-which is a neuroendocrine marker.  However many clusters of tumor cells are negative.  While the morphology alone would support a diagnosis of small cell carcinoma, there is insufficient immunohistochemistry evidence to support any specific tumor differentiation. Patient was brought back to the clinic for discussion of management plan.  He has no new complaints .  He is scheduled to have Mediport placement the day after tomorrow.  He is accompanied by his wife.   Review of Systems  Constitutional: Negative for appetite change, chills, fatigue, fever and unexpected weight change.  HENT:   Negative for hearing loss and voice change.   Eyes: Negative for eye problems and icterus.  Respiratory: Positive for cough and shortness of breath. Negative for chest tightness.   Cardiovascular: Negative for chest pain and leg swelling.  Gastrointestinal: Negative for abdominal distention and abdominal pain.  Endocrine: Negative for  hot flashes.  Genitourinary: Negative for difficulty urinating, dysuria and frequency.   Musculoskeletal: Negative for arthralgias.  Skin: Negative for itching and rash.  Neurological: Negative for light-headedness and numbness.  Hematological: Negative for adenopathy. Does not bruise/bleed easily.  Psychiatric/Behavioral: Negative for confusion.    MEDICAL HISTORY:  Past Medical History:  Diagnosis Date  . Arthritis 2019   dx of osteoarthritis and rhemautoid arthritis  . Cancer ([place])    lung  . Cataract 2019   left eye  . Chicken pox   . Collagen vascular disease ([place])   . 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 COPD (chronic obstructive pulmonary disease) ([place])   . Diverticulitis   . GERD (gastroesophageal reflux disease)   . 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 History of hiatal hernia   . History of stomach ulcers   . Hyperlipidemia   . Lymphocytic colitis   . Orthostatic hypotension   . Osteoarthritis   . Pancreatitis   . Paroxysmal atrial fibrillation (HCC)   . Presence of permanent cardiac pacemaker     SURGICAL HISTORY: Past Surgical History:  Procedure Laterality Date  . APPENDECTOMY    . BACK SURGERY     bone chip lasered  . CARDIAC CATHETERIZATION    . CATARACT EXTRACTION W/ INTRAOCULAR LENS IMPLANT Right   . COLON SURGERY  1990's   12" removed of large intestine  . EYE SURGERY    . INSERT / REPLACE / [place]  . large intestine block    . PACEMAKER INSERTION     MDT dual chamber pacemaker  . PPM GENERATOR CHANGEOUT N/A 12/02/2018   Procedure: PPM GENERATOR CHANGEOUT;  Surgeon: Deboraha Sprang, MD;  Location: [place] [facility];  Service: Cardiovascular;  Laterality: N/A;  . TONSILLECTOMY AND ADENOIDECTOMY    . VIDEO BRONCHOSCOPY WITH ENDOBRONCHIAL ULTRASOUND N/A 04/07/2019   Procedure: VIDEO BRONCHOSCOPY WITH ENDOBRONCHIAL ULTRASOUND;  Surgeon: Tyler Pita, MD;  Location: [facility];  Service: Pulmonary;   Laterality: N/A;    SOCIAL HISTORY: Social History   Socioeconomic History  . Marital status: Married    Spouse name: Not on file  . Number of children: 2  . Years of education: Not on file  . Highest education level: Not on file  Occupational History  . Occupation: Retired  Tobacco Use  . Smoking status: Former Smoker    Packs/day: 1.00    Years: 60.00    Pack years: 60.00    Types: Cigarettes    Quit date: 04/29/2017    Years since quitting: 1.9  . Smokeless tobacco: Never Used  Substance and Sexual Activity  . Alcohol use: Yes    Alcohol/week: 5.0 standard drinks    Types: 5 Glasses of wine per week  . Drug use: No  . Sexual activity: Never  Other Topics Concern  . Not on file  Social History Narrative   Married.   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      Does not have a living will.   Desires CPR, does not want prolonged life support if futile.   Social Determinants of Health   Financial Resource Strain: Low Risk   . Difficulty of Paying Living Expenses: Not hard at all  Food Insecurity: No Food Insecurity  . Worried About Charity fundraiser in the Last Year: Never true  . Ran Out of Food in the Last Year: Never true  Transportation  Needs: No Transportation Needs  . Lack of Transportation (Medical): No  . Lack of Transportation (Non-Medical): No  Physical Activity: Sufficiently Active  . Days of Exercise per Week: 7 days  . Minutes of Exercise per Session: 60 min  Stress: No Stress Concern Present  . Feeling of Stress : Not at all  Social Connections:   . Frequency of Communication with Friends and Family: Not on file  . Frequency of Social Gatherings with Friends and Family: Not on file  . Attends Religious Services: Not on file  . Active Member of Clubs or Organizations: Not on file  . Attends Archivist Meetings: Not on file  . Marital Status: Not on file  Intimate Partner Violence: Not At Risk  . Fear of Current or  Ex-Partner: No  . Emotionally Abused: No  . Physically Abused: No  . Sexually Abused: No    FAMILY HISTORY: Family History  Problem Relation Age of Onset  . Lung cancer Mother   . Heart disease Father   . Alzheimer's disease Father     ALLERGIES:  is allergic to sulfa antibiotics and penicillins.  MEDICATIONS:  Current Outpatient Medications  Medication Sig Dispense Refill  . albuterol (PROAIR HFA) 108 (90 Base) MCG/ACT inhaler Inhale 1-2 puffs into the lungs every 6 (six) hours as needed for wheezing or shortness of breath. 1 Inhaler 5  . antiseptic oral rinse (BIOTENE) LIQD 15 mLs by Mouth Rinse route as needed for dry mouth.    . brimonidine (ALPHAGAN) 0.15 % ophthalmic solution Place 1 drop into both eyes 2 (two) times daily.     . Budeson-Glycopyrrol-Formoterol (BREZTRI AEROSPHERE) 160-9-4.8 MCG/ACT AERO Inhale 2 puffs into the lungs 2 (two) times daily. 10.7 g 6  . Cholecalciferol (VITAMIN D3) 2000 UNITS capsule Take 2,000 Units by mouth daily with lunch.     . dextromethorphan-guaiFENesin (MUCINEX DM) 30-600 MG 12hr tablet Take 1 tablet by mouth 2 (two) times daily as needed (congestion/cough).     Marland Kitchen ELIQUIS 5 MG TABS tablet TAKE 1 TABLET BY MOUTH TWICE DAILY (Patient taking differently: Take 5 mg by mouth 2 (two) times daily. ) 60 tablet 6  . famotidine (PEPCID) 20 MG tablet Take 20 mg by mouth 2 (two) times daily.    . fenofibrate (TRICOR) 145 MG tablet Take 1 tablet (145 mg total) by mouth daily. 90 tablet 1  . folic acid (FOLVITE) 1 MG tablet Take 1 mg by mouth daily.    Marland Kitchen gabapentin (NEURONTIN) 100 MG capsule Take 100 mg by mouth 3 (three) times daily.    . methotrexate 2.5 MG tablet Take 12.5 mg by mouth every Sunday.     . sodium chloride (OCEAN) 0.65 % SOLN nasal spray Place 1 spray into both nostrils at bedtime as needed for congestion.     . traMADol (ULTRAM) 50 MG tablet Take 25 mg by mouth at bedtime as needed for moderate pain.     . magic mouthwash SOLN Take 5  mLs by mouth 4 (four) times daily.     No current facility-administered medications for this visit.     PHYSICAL EXAMINATION: ECOG PERFORMANCE STATUS: 1 - Symptomatic but completely ambulatory Vitals:   04/15/19 1052  BP: 126/80  Pulse: 88  Resp: 18  Temp: (!) 95.8 F (35.4 C)   Filed Weights   04/15/19 1052  Weight: 173 lb 4.8 oz (78.6 kg)    Physical Exam Constitutional:      General: He is not in  acute distress.    Comments: Patient walks independently  HENT:     Head: Normocephalic and atraumatic.  Eyes:     General: No scleral icterus. Cardiovascular:     Rate and Rhythm: Normal rate and regular rhythm.     Heart sounds: Normal heart sounds.  Pulmonary:     Effort: Pulmonary effort is normal. No respiratory distress.     Breath sounds: No wheezing.     Comments: Severely decreased breath sound bilaterally. Abdominal:     General: Bowel sounds are normal. There is no distension.     Palpations: Abdomen is soft.  Musculoskeletal:        General: No deformity. Normal range of motion.     Cervical back: Normal range of motion and neck supple.  Skin:    General: Skin is warm and dry.     Findings: No erythema or rash.  Neurological:     Mental Status: He is alert and oriented to person, place, and time. Mental status is at baseline.     Cranial Nerves: No cranial nerve deficit.     Coordination: Coordination normal.  Psychiatric:        Mood and Affect: Mood normal.     LABORATORY DATA:  I have reviewed the data as listed Lab Results  Component Value Date   WBC 6.2 03/13/2019   HGB 14.7 03/13/2019   HCT 43.7 03/13/2019   MCV 106.6 (H) 03/13/2019   PLT 186.0 03/13/2019   Recent Labs    06/18/18 1016 06/18/18 1016 11/28/18 1256 11/28/18 1256 02/27/19 1023 03/13/19 0806 04/03/19 1109  NA 137   < > 134*  --   --  134* 133*  K 4.8   < > 4.4  --   --  4.6 4.8  CL 103   < > 101  --   --  102 101  CO2 27   < > 26  --   --  26 24  GLUCOSE 109*   < >  117*  --   --  105* 108*  BUN 22   < > 19  --   --  18 18  CREATININE 1.32*   < > 1.23   < > 1.20 1.18 1.24  CALCIUM 9.5   < > 9.4  --   --  9.4 9.3  GFRNONAA 52*  --  56*  --   --   --  55*  GFRAA 60*  --  >60  --   --   --  >60  PROT 7.0  --   --   --   --  6.9 7.0  ALBUMIN 4.2  --   --   --   --  4.2 4.0  AST 65*  --   --   --   --  53* 53*  ALT 47*  --   --   --   --  33 31  ALKPHOS 49  --   --   --   --  71 60  BILITOT 0.8  --   --   --   --  0.9 1.2   < > = values in this interval not displayed.   Iron/TIBC/Ferritin/ %Sat No results found for: IRON, TIBC, FERRITIN, IRONPCTSAT    RADIOGRAPHIC STUDIES: I have personally reviewed the radiological images as listed and agreed with the findings in the report.  CT Head W Wo Contrast  Result Date: 04/04/2019 CLINICAL DATA:  Lung mass staging.  No CNS symptoms per report EXAM: CT HEAD WITHOUT AND WITH CONTRAST TECHNIQUE: Contiguous axial images were obtained from the base of the skull through the vertex without and with intravenous contrast CONTRAST:  50mL OMNIPAQUE IOHEXOL 300 MG/ML  SOLN COMPARISON:  None. FINDINGS: Brain: No evidence of acute infarction, hemorrhage, hydrocephalus, extra-axial collection or mass lesion/mass effect. No abnormal intracranial enhancement Vascular: Major vessels are enhancing Skull: Negative for fracture or bone lesion Sinuses/Orbits: Negative IMPRESSION: Negative.  No evidence of metastatic disease. Electronically Signed   By: Monte Fantasia M.D.   On: 04/04/2019 10:06   NM PET Image Restag (PS) Skull Base To Thigh  Result Date: 03/20/2019 CLINICAL DATA:  Subsequent treatment strategy for non-small cell lung cancer. EXAM: NUCLEAR MEDICINE PET SKULL BASE TO THIGH TECHNIQUE: 9.3 mCi F-18 FDG was injected intravenously. Full-ring PET imaging was performed from the skull base to thigh after the radiotracer. CT data was obtained and used for attenuation correction and anatomic localization. Fasting blood glucose:  97 mg/dl COMPARISON:  Multiple exams, including CT chest 02/27/2019 and prior PET-CT from 02/18/2018 FINDINGS: Mediastinal blood pool activity: SUV max 2.7 Liver activity: SUV max NA NECK: Bilateral hypermetabolic posterior parotid nodules are present, partially obscured on the CT data due to streak artifact from the patient's fillings. The left posterior nodule has a maximum SUV of 6.6 (formerly 10.3) and the right posterior parotid nodule has maximum SUV of 7.9 (formerly 9.8). Incidental CT findings: Bilateral common carotid atherosclerotic calcification. CHEST: The right paratracheal node anterior to the carina measures 2.3 cm in short axis on image 89/3 (formerly measured at 2.1 cm on 02/27/2019 and about 0.7 cm on 02/18/2018) with maximum SUV 10.8, compatible with malignancy. The small lymph nodes in this vicinity on the prior PET-CT were not hypermetabolic. The peripheral right upper lobe subpleural nodule on image 93/3 measures 1.4 by 2.4 cm, essentially stable from 02/27/2019 but increased in size from the 02/18/2018 exam. Maximum SUV 12.4, previously 4.3 on 02/18/2018. A subcarinal lymph node measuring 1.2 cm in short axis on image 96/3 is stable from 02/27/2019, and has a maximum SUV of 4.6 (previously not hypermetabolic). Along the confluent interstitial opacity in the left upper lobe adjacent to the major fissure, there is a 1.2 by 1.0 cm nodule on image 100/3 which is roughly stable from the 02/27/2019 exam, with maximum SUV 3.5, suspicious for malignancy. There is some hazy accentuated metabolic activity throughout the adjacent ground-glass opacity in the left upper lobe and left lower lobe. Along the medial margin of the right pectoralis minor muscle there is a small focus of accentuated metabolic activity which may be from an adjacent lymph node, measuring with maximum SUV of 3.5. It is possible that this simply represents physiologic muscular activity. Incidental CT findings: Pacer/defibrillator  device noted. Coronary, aortic arch, and branch vessel atherosclerotic vascular disease. Mild cardiomegaly. Small type 1 hiatal hernia. Peripheral interstitial accentuation in the lungs favors fibrosis. Extensive centrilobular emphysema. ABDOMEN/PELVIS: No significant abnormal hypermetabolic activity in this region. Incidental CT findings: Aortoiliac atherosclerotic vascular disease. Exophytic hyperdense 1.2 by 0.9 cm lesion of the left kidney lower pole is photopenic and likely a small complex but benign cyst. Scattered colonic diverticula. SKELETON: No significant abnormal hypermetabolic activity in this region. Incidental CT findings: none IMPRESSION: 1. Hypermetabolic right lower paratracheal and subcarinal adenopathy with hypermetabolic right upper lobe subpleural nodule, all significantly worsened compared to the prior PET-CT, but roughly similar in size to the chest CT from 3 weeks ago. 2. A  1.2 by 1.0 cm nodule in the left upper lobe adjacent to confluent ground-glass densities is stable in size from 3 weeks ago but new from 02/18/2018, with maximum SUV of 3.5, potentially from metastatic disease or confluent inflammatory condition. 3. A tiny focus of hypermetabolic activity along the right upper pectoralis minor muscle medial margin could be from a small hypermetabolic lymph node or simply due to physiologic activity in the muscle. 4. Bilateral parotid nodules, moderately reduced in activity compared to the prior exam, appearance favoring Warthin's tumors. 5. Other imaging findings of potential clinical significance: Aortic Atherosclerosis (ICD10-I70.0) and Emphysema (ICD10-J43.9). Coronary atherosclerosis with mild cardiomegaly. Small type 1 hiatal hernia. Suspected fibrosis in the lungs. Left kidney lower pole hyperdense lesion is likely a complex cyst. Electronically Signed   By: Van Clines M.D.   On: 03/20/2019 12:08      ASSESSMENT & PLAN:  1. Malignant neoplasm of lung, unspecified  laterality, unspecified part of lung ([place])   2. Goals of care, counseling/discussion   3. Pulmonary emphysema, unspecified emphysema type ([place])    #Right lung cancer.  Clinically T1c N2 M0. CT head is negative. MRI is contraindicated due to pace marker.  Additional pathology addendum showed that patient has high-grade carcinoma, poorly differentiated. Per my discussion with pathology Dr. 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 Regimen with concurrent radiation and chemotherapy with carboplatin and taxol would not be sufficient for small cell lung cancer treatment. We will treat the benefit of doubt using small cell lung cancer regimen,  Recommend concurrent chemoradiation using carboplatin and etoposide day 1-3 every 3 weeks. He will also receive immunotherapy after he finishes chemo/Rad I confirmed with Dr. 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  I also discussed with patient that the left upper lobe nodule 1.2 x 1 cm which has a low-grade FDG activity on the PET scan, possible malignancy, a secondary primary vs metastatic lesion from right lung cancer.  COPD/ emphysema, high risk for additional biopsy.  Goal of care was discussed with patient.  Curative intent if left lung nodule is a secondary malignancy, otherwise his condition is not curable.  His pathology will be further discussed on tumor board.  Patient will receive chemotherapy education, Mediport placement and hopefully starting chemotherapy next  week.  Orders Placed This Encounter  Procedures  . CBC with Differential    Standing Status:   Standing    Number of Occurrences:   20    Standing Expiration Date:   04/15/2020  . Comprehensive metabolic panel    Standing Status:   Standing    Number of Occurrences:   20    Standing Expiration Date:   04/15/2020  . Ambulatory Referral to Palliative Care    Referral Priority:   Routine    Referral Type:   Consultation    Referral Reason:   Advance Care Planning    Referred to Provider:   Borders, Kirt Boys, NP    Number of Visits Requested:   1    All questions were answered. The patient knows to call the clinic with any  problems questions or concerns.  Return of visit:  1 week    Earlie Server, MD, PhD Hematology Oncology [facility] Pager- [phone] 04/15/2019

## 2019-04-15 NOTE — Progress Notes (Signed)
START ON PATHWAY REGIMEN - Small Cell Lung     A cycle is every 21 days:     Etoposide      Carboplatin   **Always confirm dose/schedule in your pharmacy ordering system**  Patient Characteristics: Newly Diagnosed, Preoperative or Nonsurgical Candidate (Clinical Staging), First Line, Limited Stage, Nonsurgical Candidate Therapeutic Status: Newly Diagnosed, Preoperative or Nonsurgical Candidate (Clinical Staging) AJCC T Category: cTX AJCC N Category: cN2 AJCC M Category: cM0 AJCC 8 Stage Grouping: Unknown Stage Classification: Limited Surgical Candidacy: Nonsurgical Candidate Intent of Therapy: Curative Intent, Discussed with Patient

## 2019-04-16 ENCOUNTER — Other Ambulatory Visit (INDEPENDENT_AMBULATORY_CARE_PROVIDER_SITE_OTHER): Payer: Self-pay | Admitting: Nurse Practitioner

## 2019-04-16 LAB — SARS CORONAVIRUS 2 (TAT 6-24 HRS): SARS Coronavirus 2: NEGATIVE

## 2019-04-17 ENCOUNTER — Other Ambulatory Visit: Payer: Medicare PPO

## 2019-04-17 ENCOUNTER — Encounter: Payer: Self-pay | Admitting: Vascular Surgery

## 2019-04-17 ENCOUNTER — Encounter
Admission: RE | Disposition: A | Discharge status: Home / Self Care | Payer: Self-pay | Source: Home / Self Care | Attending: Vascular Surgery

## 2019-04-17 ENCOUNTER — Other Ambulatory Visit: Payer: Self-pay

## 2019-04-17 ENCOUNTER — Ambulatory Visit
Admission: RE | Admit: 2019-04-17 | Discharge: 2019-04-17 | Disposition: A | Discharge status: Home / Self Care | Payer: Medicare PPO | Attending: Vascular Surgery | Admitting: Vascular Surgery

## 2019-04-17 DIAGNOSIS — Z87891 Personal history of nicotine dependence: Secondary | ICD-10-CM | POA: Diagnosis not present

## 2019-04-17 DIAGNOSIS — I442 Atrioventricular block, complete: Secondary | ICD-10-CM | POA: Insufficient documentation

## 2019-04-17 DIAGNOSIS — I480 Paroxysmal atrial fibrillation: Secondary | ICD-10-CM | POA: Diagnosis not present

## 2019-04-17 DIAGNOSIS — Z950 Presence of cardiac pacemaker: Secondary | ICD-10-CM | POA: Insufficient documentation

## 2019-04-17 DIAGNOSIS — K219 Gastro-esophageal reflux disease without esophagitis: Secondary | ICD-10-CM | POA: Diagnosis not present

## 2019-04-17 DIAGNOSIS — Z7901 Long term (current) use of anticoagulants: Secondary | ICD-10-CM | POA: Diagnosis not present

## 2019-04-17 DIAGNOSIS — C3491 Malignant neoplasm of unspecified part of right bronchus or lung: Secondary | ICD-10-CM | POA: Diagnosis present

## 2019-04-17 DIAGNOSIS — C3490 Malignant neoplasm of unspecified part of unspecified bronchus or lung: Secondary | ICD-10-CM

## 2019-04-17 DIAGNOSIS — Z79899 Other long term (current) drug therapy: Secondary | ICD-10-CM | POA: Diagnosis not present

## 2019-04-17 DIAGNOSIS — J449 Chronic obstructive pulmonary disease, unspecified: Secondary | ICD-10-CM | POA: Insufficient documentation

## 2019-04-17 DIAGNOSIS — E785 Hyperlipidemia, unspecified: Secondary | ICD-10-CM | POA: Insufficient documentation

## 2019-04-17 HISTORY — PX: PORTA CATH INSERTION: CATH118285

## 2019-04-17 SURGERY — PORTA CATH INSERTION
Anesthesia: Moderate Sedation

## 2019-04-17 MED ORDER — FENTANYL CITRATE (PF) 100 MCG/2ML IJ SOLN
INTRAMUSCULAR | Status: DC | PRN
  Administered 2019-04-17 (×2): 50 ug via INTRAVENOUS

## 2019-04-17 MED ORDER — METHYLPREDNISOLONE SODIUM SUCC 125 MG IJ SOLR: 125.0000 mg | Freq: Once | INTRAMUSCULAR | Status: DC | PRN

## 2019-04-17 MED ORDER — DIPHENHYDRAMINE HCL 50 MG/ML IJ SOLN: 50.0000 mg | Freq: Once | INTRAMUSCULAR | Status: DC | PRN

## 2019-04-17 MED ORDER — FENTANYL CITRATE (PF) 100 MCG/2ML IJ SOLN
INTRAMUSCULAR | Status: AC
  Filled 2019-04-17: qty 2

## 2019-04-17 MED ORDER — CLINDAMYCIN PHOSPHATE 300 MG/50ML IV SOLN
300.0000 mg | Freq: Once | INTRAVENOUS | Status: AC
  Administered 2019-04-17: 300 mg via INTRAVENOUS

## 2019-04-17 MED ORDER — MIDAZOLAM HCL 2 MG/ML PO SYRP: 8.0000 mg | ORAL_SOLUTION | Freq: Once | ORAL | Status: DC | PRN

## 2019-04-17 MED ORDER — CLINDAMYCIN PHOSPHATE 300 MG/50ML IV SOLN
INTRAVENOUS | Status: AC
  Filled 2019-04-17: qty 50

## 2019-04-17 MED ORDER — SODIUM CHLORIDE 0.9 % IV SOLN
Freq: Once | INTRAVENOUS | Status: DC
  Filled 2019-04-17: qty 2

## 2019-04-17 MED ORDER — FAMOTIDINE 20 MG PO TABS: 40.0000 mg | ORAL_TABLET | Freq: Once | ORAL | Status: DC | PRN

## 2019-04-17 MED ORDER — ONDANSETRON HCL 4 MG/2ML IJ SOLN: 4.0000 mg | Freq: Four times a day (QID) | INTRAMUSCULAR | Status: DC | PRN

## 2019-04-17 MED ORDER — SODIUM CHLORIDE 0.9 % IV SOLN: INTRAVENOUS | Status: DC

## 2019-04-17 MED ORDER — MIDAZOLAM HCL 5 MG/5ML IJ SOLN
INTRAMUSCULAR | Status: AC
  Filled 2019-04-17: qty 5

## 2019-04-17 MED ORDER — HYDROMORPHONE HCL 1 MG/ML IJ SOLN: 1.0000 mg | Freq: Once | INTRAMUSCULAR | Status: DC | PRN

## 2019-04-17 MED ORDER — MIDAZOLAM HCL 2 MG/2ML IJ SOLN
INTRAMUSCULAR | Status: DC | PRN
  Administered 2019-04-17: 1 mg via INTRAVENOUS
  Administered 2019-04-17: 2 mg via INTRAVENOUS

## 2019-04-17 SURGICAL SUPPLY — 11 items
DERMABOND ADVANCED (GAUZE/BANDAGES/DRESSINGS) ×2
DERMABOND ADVANCED .7 DNX12 (GAUZE/BANDAGES/DRESSINGS) ×1 IMPLANT
ELECT REM PT RETURN 9FT ADLT (ELECTROSURGICAL) ×3
ELECTRODE REM PT RTRN 9FT ADLT (ELECTROSURGICAL) ×1 IMPLANT
KIT PORT POWER 8FR ISP CVUE (Port) ×3 IMPLANT
PACK ANGIOGRAPHY (CUSTOM PROCEDURE TRAY) ×3 IMPLANT
PENCIL ELECTRO HAND CTR (MISCELLANEOUS) ×3 IMPLANT
SUT MNCRL AB 4-0 PS2 18 (SUTURE) ×3 IMPLANT
SUT PROLENE 0 CT 1 30 (SUTURE) ×3 IMPLANT
SUT VIC AB 3-0 CT1 27 (SUTURE) ×2
SUT VIC AB 3-0 CT1 TAPERPNT 27 (SUTURE) ×1 IMPLANT

## 2019-04-17 NOTE — Discharge Instructions (Signed)
Implanted Port Insertion, Care After This sheet gives you information about how to care for yourself after your procedure. Your health care provider may also give you more specific instructions. If you have problems or questions, contact your health care provider. What can I expect after the procedure? After the procedure, it is common to have:  Discomfort at the port insertion site.  Bruising on the skin over the port. This should improve over 3-4 days. Follow these instructions at home: [facility] care  After your port is placed, you will get a manufacturer's information card. The card has information about your port. Keep this card with you at all times.  Take care of the port as told by your health care provider. Ask your health care provider if you or a family member can get training for taking care of the port at home. A home health care nurse may also take care of the port.  Make sure to remember what type of port you have. Incision care      Follow instructions from your health care provider about how to take care of your port insertion site. Make sure you: ? Wash your hands with soap and water before and after you change your bandage (dressing). If soap and water are not available, use hand sanitizer. ? Change your dressing as told by your health care provider. ? Leave stitches (sutures), skin glue, or adhesive strips in place. These skin closures may need to stay in place for 2 weeks or longer. If adhesive strip edges start to loosen and curl up, you may trim the loose edges. Do not remove adhesive strips completely unless your health care provider tells you to do that.  Check your port insertion site every day for signs of infection. Check for: ? Redness, swelling, or pain. ? Fluid or blood. ? Warmth. ? Pus or a bad smell. Activity  Return to your normal activities as told by your health care provider. Ask your health care provider what activities are safe for you.  Do not  lift anything that is heavier than 10 lb (4.5 kg), or the limit that you are told, until your health care provider says that it is safe. General instructions  Take over-the-counter and prescription medicines only as told by your health care provider.  Do not take baths, swim, or use a hot tub until your health care provider approves. Ask your health care provider if you may take showers. You may only be allowed to take sponge baths.  Do not drive for 24 hours if you were given a sedative during your procedure.  Wear a medical alert bracelet in case of an emergency. This will tell any health care providers that you have a port.  Keep all follow-up visits as told by your health care provider. This is important. Contact a health care provider if:  You cannot flush your port with saline as directed, or you cannot draw blood from the port.  You have a fever or chills.  You have redness, swelling, or pain around your port insertion site.  You have fluid or blood coming from your port insertion site.  Your port insertion site feels warm to the touch.  You have pus or a bad smell coming from the port insertion site. Get help right away if:  You have chest pain or shortness of breath.  You have bleeding from your port that you cannot control. Summary  Take care of the port as told by your health  care provider. Keep the manufacturer's information card with you at all times.  Change your dressing as told by your health care provider.  Contact a health care provider if you have a fever or chills or if you have redness, swelling, or pain around your port insertion site.  Keep all follow-up visits as told by your health care provider. This information is not intended to replace advice given to you by your health care provider. Make sure you discuss any questions you have with your health care provider. Document Revised: 09/04/2017 Document Reviewed: 09/04/2017 Elsevier Patient Education   [place].   Moderate Conscious Sedation, Adult, Care After These instructions provide you with information about caring for yourself after your procedure. Your health care provider may also give you more specific instructions. Your treatment has been planned according to current medical practices, but problems sometimes occur. Call your health care provider if you have any problems or questions after your procedure. What can I expect after the procedure? After your procedure, it is common:  To feel sleepy for several hours.  To feel clumsy and have poor balance for several hours.  To have poor judgment for several hours.  To vomit if you eat too soon. Follow these instructions at home: For at least 24 hours after the procedure:   Do not: ? Participate in activities where you could fall or become injured. ? Drive. ? Use heavy machinery. ? Drink alcohol. ? Take sleeping pills or medicines that cause drowsiness. ? Make important decisions or sign legal documents. ? Take care of children on your own.  Rest. Eating and drinking  Follow the diet recommended by your health care provider.  If you vomit: ? Drink water, juice, or soup when you can drink without vomiting. ? Make sure you have little or no nausea before eating solid foods. General instructions  Have a responsible adult stay with you until you are awake and alert.  Take over-the-counter and prescription medicines only as told by your health care provider.  If you smoke, do not smoke without supervision.  Keep all follow-up visits as told by your health care provider. This is important. Contact a health care provider if:  You keep feeling nauseous or you keep vomiting.  You feel light-headed.  You develop a rash.  You have a fever. Get help right away if:  You have trouble breathing. This information is not intended to replace advice given to you by your health care provider. Make sure you discuss  any questions you have with your health care provider. Document Revised: 01/19/2017 Document Reviewed: 05/29/2015 Elsevier Patient Education  2020 [employer].

## 2019-04-17 NOTE — Patient Instructions (Signed)
Etoposide, VP-16 injection What is this medicine? ETOPOSIDE, VP-16 (e toe POE side) is a chemotherapy drug. It is used to treat testicular cancer, lung cancer, and other cancers. This medicine may be used for other purposes; ask your health care provider or pharmacist if you have questions. COMMON BRAND NAME(S): Etopophos, Toposar, VePesid What should I tell my health care provider before I take this medicine? They need to know if you have any of these conditions:  infection  kidney disease  liver disease  low blood counts, like low white cell, platelet, or red cell counts  an unusual or allergic reaction to etoposide, other medicines, foods, dyes, or preservatives  pregnant or trying to get pregnant  breast-feeding How should I use this medicine? This medicine is for infusion into a vein. It is administered in a hospital or clinic by a specially trained health care professional. Talk to your pediatrician regarding the use of this medicine in children. Special care may be needed. Overdosage: If you think you have taken too much of this medicine contact a poison control center or emergency room at once. NOTE: This medicine is only for you. Do not share this medicine with others. What if I miss a dose? It is important not to miss your dose. Call your doctor or health care professional if you are unable to keep an appointment. What may interact with this medicine? This medicine may interact with the following medications:  warfarin This list may not describe all possible interactions. Give your health care provider a list of all the medicines, herbs, non-prescription drugs, or dietary supplements you use. Also tell them if you smoke, drink alcohol, or use illegal drugs. Some items may interact with your medicine. What should I watch for while using this medicine? Visit your doctor for checks on your progress. This drug may make you feel generally unwell. This is not uncommon, as  chemotherapy can affect healthy cells as well as cancer cells. Report any side effects. Continue your course of treatment even though you feel ill unless your doctor tells you to stop. In some cases, you may be given additional medicines to help with side effects. Follow all directions for their use. Call your doctor or health care professional for advice if you get a fever, chills or sore throat, or other symptoms of a cold or flu. Do not treat yourself. This drug decreases your body's ability to fight infections. Try to avoid being around people who are sick. This medicine may increase your risk to bruise or bleed. Call your doctor or health care professional if you notice any unusual bleeding. Talk to your doctor about your risk of cancer. You may be more at risk for certain types of cancers if you take this medicine. Do not become pregnant while taking this medicine or for at least 6 months after stopping it. Women should inform their doctor if they wish to become pregnant or think they might be pregnant. Women of child-bearing potential will need to have a negative pregnancy test before starting this medicine. There is a potential for serious side effects to an unborn child. Talk to your health care professional or pharmacist for more information. Do not breast-feed an infant while taking this medicine. Men must use a latex condom during sexual contact with a woman while taking this medicine and for at least 4 months after stopping it. A latex condom is needed even if you have had a vasectomy. Contact your doctor right away if your partner  becomes pregnant. Do not donate sperm while taking this medicine and for at least 4 months after you stop taking this medicine. Men should inform their doctors if they wish to father a child. This medicine may lower sperm counts. What side effects may I notice from receiving this medicine? Side effects that you should report to your doctor or health care professional  as soon as possible:  allergic reactions like skin rash, itching or hives, swelling of the face, lips, or tongue  low blood counts - this medicine may decrease the number of white blood cells, red blood cells, and platelets. You may be at increased risk for infections and bleeding  nausea, vomiting  redness, blistering, peeling or loosening of the skin, including inside the mouth  signs and symptoms of infection like fever; chills; cough; sore throat; pain or trouble passing urine  signs and symptoms of low red blood cells or anemia such as unusually weak or tired; feeling faint or lightheaded; falls; breathing problems  unusual bruising or bleeding Side effects that usually do not require medical attention (report to your doctor or health care professional if they continue or are bothersome):  changes in taste  diarrhea  hair loss  loss of appetite  mouth sores This list may not describe all possible side effects. Call your doctor for medical advice about side effects. You may report side effects to FDA at 1-800-FDA-1088. Where should I keep my medicine? This drug is given in a hospital or clinic and will not be stored at home. NOTE: This sheet is a summary. It may not cover all possible information. If you have questions about this medicine, talk to your doctor, pharmacist, or health care provider.  2020 Elsevier/Gold Standard (2018-04-03 16:57:15) Carboplatin injection What is this medicine? CARBOPLATIN (KAR boe pla tin) is a chemotherapy drug. It targets fast dividing cells, like cancer cells, and causes these cells to die. This medicine is used to treat ovarian cancer and many other cancers. This medicine may be used for other purposes; ask your health care provider or pharmacist if you have questions. COMMON BRAND NAME(S): Paraplatin What should I tell my health care provider before I take this medicine? They need to know if you have any of these conditions:  blood  disorders  hearing problems  kidney disease  recent or ongoing radiation therapy  an unusual or allergic reaction to carboplatin, cisplatin, other chemotherapy, other medicines, foods, dyes, or preservatives  pregnant or trying to get pregnant  breast-feeding How should I use this medicine? This drug is usually given as an infusion into a vein. It is administered in a hospital or clinic by a specially trained health care professional. Talk to your pediatrician regarding the use of this medicine in children. Special care may be needed. Overdosage: If you think you have taken too much of this medicine contact a poison control center or emergency room at once. NOTE: This medicine is only for you. Do not share this medicine with others. What if I miss a dose? It is important not to miss a dose. Call your doctor or health care professional if you are unable to keep an appointment. What may interact with this medicine?  medicines for seizures  medicines to increase blood counts like filgrastim, pegfilgrastim, sargramostim  some antibiotics like amikacin, gentamicin, neomycin, streptomycin, tobramycin  vaccines Talk to your doctor or health care professional before taking any of these medicines:  acetaminophen  aspirin  ibuprofen  ketoprofen  naproxen This list  may not describe all possible interactions. Give your health care provider a list of all the medicines, herbs, non-prescription drugs, or dietary supplements you use. Also tell them if you smoke, drink alcohol, or use illegal drugs. Some items may interact with your medicine. What should I watch for while using this medicine? Your condition will be monitored carefully while you are receiving this medicine. You will need important blood work done while you are taking this medicine. This drug may make you feel generally unwell. This is not uncommon, as chemotherapy can affect healthy cells as well as cancer cells. Report any  side effects. Continue your course of treatment even though you feel ill unless your doctor tells you to stop. In some cases, you may be given additional medicines to help with side effects. Follow all directions for their use. Call your doctor or health care professional for advice if you get a fever, chills or sore throat, or other symptoms of a cold or flu. Do not treat yourself. This drug decreases your body's ability to fight infections. Try to avoid being around people who are sick. This medicine may increase your risk to bruise or bleed. Call your doctor or health care professional if you notice any unusual bleeding. Be careful brushing and flossing your teeth or using a toothpick because you may get an infection or bleed more easily. If you have any dental work done, tell your dentist you are receiving this medicine. Avoid taking products that contain aspirin, acetaminophen, ibuprofen, naproxen, or ketoprofen unless instructed by your doctor. These medicines may hide a fever. Do not become pregnant while taking this medicine. Women should inform their doctor if they wish to become pregnant or think they might be pregnant. There is a potential for serious side effects to an unborn child. Talk to your health care professional or pharmacist for more information. Do not breast-feed an infant while taking this medicine. What side effects may I notice from receiving this medicine? Side effects that you should report to your doctor or health care professional as soon as possible:  allergic reactions like skin rash, itching or hives, swelling of the face, lips, or tongue  signs of infection - fever or chills, cough, sore throat, pain or difficulty passing urine  signs of decreased platelets or bleeding - bruising, pinpoint red spots on the skin, black, tarry stools, nosebleeds  signs of decreased red blood cells - unusually weak or tired, fainting spells, lightheadedness  breathing  problems  changes in hearing  changes in vision  chest pain  high blood pressure  low blood counts - This drug may decrease the number of white blood cells, red blood cells and platelets. You may be at increased risk for infections and bleeding.  nausea and vomiting  pain, swelling, redness or irritation at the injection site  pain, tingling, numbness in the hands or feet  problems with balance, talking, walking  trouble passing urine or change in the amount of urine Side effects that usually do not require medical attention (report to your doctor or health care professional if they continue or are bothersome):  hair loss  loss of appetite  metallic taste in the mouth or changes in taste This list may not describe all possible side effects. Call your doctor for medical advice about side effects. You may report side effects to FDA at 1-800-FDA-1088. Where should I keep my medicine? This drug is given in a hospital or clinic and will not be stored at home.  NOTE: This sheet is a summary. It may not cover all possible information. If you have questions about this medicine, talk to your doctor, pharmacist, or health care provider.  2020 Elsevier/Gold Standard (2007-05-14 14:38:05)

## 2019-04-17 NOTE — H&P (Signed)
[facility] History & Physical Update  The patient was interviewed and re-examined.  The patient's previous History and Physical has been reviewed and is unchanged.  There is no change in the plan of care. We plan to proceed with the scheduled procedure.  Leotis Pain, MD  04/17/2019, 10:08 AM

## 2019-04-17 NOTE — Op Note (Addendum)
      [facility]       Operative Note  Date: 04/17/2019  Preoperative diagnosis:  1. Lung cancer  Postoperative diagnosis:  Same as above  Procedures: #1. Ultrasound guidance for vascular access to the right internal jugular vein. #2.  Right jugular venogram and superior venacavogram through the Seldinger needle #3. Fluoroscopic guidance for placement of catheter. #4. Placement of CT compatible Port-A-Cath, right internal jugular vein.  Surgeon: Leotis Pain, MD.   Anesthesia: Local with moderate conscious sedation for approximately 20  minutes using 3 mg of Versed and 100 mcg of Fentanyl  Fluoroscopy time: less than 1 minute  Contrast used: 5 cc  Estimated blood loss: 3 cc  Indication for the procedure:  The patient is a 79 y.o.male with lung cancer.  The patient needs a Port-A-Cath for durable venous access, chemotherapy, lab draws, and CT scans. We are asked to place this. Risks and benefits were discussed and informed consent was obtained.  Description of procedure: The patient was brought to the vascular and interventional radiology suite.  Moderate conscious sedation was administered throughout the procedure during a face to face encounter with the patient with my supervision of the RN administering medicines and monitoring the patient's vital signs, pulse oximetry, telemetry and mental status throughout from the start of the procedure until the patient was taken to the recovery room. The right neck chest and shoulder were sterilely prepped and draped, and a sterile surgical field was created. Ultrasound was used to help visualize a patent right internal jugular vein. This was then accessed under direct ultrasound guidance without difficulty with the Seldinger needle and a permanent image was recorded. A J-wire was placed but initially would not pass.  I then performed a venogram through the Seldinger needle evaluating the right jugular vein, innominate vein, and  SVC.  The jugular vein was widely patent.  There was significant tortuosity as the jugular vein join the innominate vein but no clear stenosis.  The superior vena cava was then widely patent.  Using this as a roadmap, I was able to advance the J-wire down into the right atrium. After skin nick and dilatation, the peel-away sheath was then placed over the wire. I then anesthetized an area under the clavicle approximately 1-2 fingerbreadths. A transverse incision was created and an inferior pocket was created with electrocautery and blunt dissection. The port was then brought onto the field, placed into the pocket and secured to the chest wall with 2 Prolene sutures. The catheter was connected to the port and tunneled from the subclavicular incision to the access site. Fluoroscopic guidance was then used to cut the catheter to an appropriate length. The catheter was then placed through the peel-away sheath and the peel-away sheath was removed. The catheter tip was parked in excellent location under fluorocoscopic guidance in the SVC just above the right atrium. The pocket was then irrigated with antibiotic impregnated saline and the wound was closed with a running 3-0 Vicryl and a 4-0 Monocryl. The access incision was closed with a single 4-0 Monocryl. The Huber needle was used to withdraw blood and flush the port with heparinized saline. Dermabond was then placed as a dressing. The patient tolerated the procedure well and was taken to the recovery room in stable condition.   Leotis Pain 04/17/2019 1:26 PM   This note was created with Dragon Medical transcription system. Any errors in dictation are purely unintentional.

## 2019-04-17 NOTE — Progress Notes (Signed)
Tumor Board Documentation  Darryl White was presented by Dr Patsey Berthold at our Tumor Board on 04/17/2019, which included representatives from medical oncology, radiation oncology, navigation, pathology, radiology, surgical, surgical oncology, internal medicine, pharmacy, genetics, research, palliative care, pulmonology.  Darryl White currently presents as a new patient, for new positive pathology, for [place] with history of the following treatments: surgical intervention(s), active survellience.  Additionally, we reviewed previous medical and familial history, history of present illness, and recent lab results along with all available histopathologic and imaging studies. The tumor board considered available treatment options and made the following recommendations: Concurrent chemo-radiation therapy    The following procedures/referrals were also placed: No orders of the defined types were placed in this encounter.   Clinical Trial Status: not discussed   Staging used: AJCC Stage Group  AJCC Staging: T: 1C N: 2 M: 0     National site-specific guidelines NCCN were discussed with respect to the case.  Tumor board is a meeting of clinicians from various specialty areas who evaluate and discuss patients for whom a multidisciplinary approach is being considered. Final determinations in the plan of care are those of the provider(s). The responsibility for follow up of recommendations given during tumor board is that of the provider.   Today's extended care, comprehensive team conference, Darryl White was not present for the discussion and was not examined.   Multidisciplinary Tumor Board is a multidisciplinary case peer review process.  Decisions discussed in the Multidisciplinary Tumor Board reflect the opinions of the specialists present at the conference without having examined the patient.  Ultimately, treatment and diagnostic decisions rest with the primary provider(s) and the patient.

## 2019-04-18 ENCOUNTER — Inpatient Hospital Stay: Payer: Medicare PPO

## 2019-04-18 ENCOUNTER — Other Ambulatory Visit: Payer: Self-pay | Admitting: Oncology

## 2019-04-18 ENCOUNTER — Other Ambulatory Visit: Payer: Self-pay

## 2019-04-18 ENCOUNTER — Encounter: Payer: Self-pay | Admitting: Cardiology

## 2019-04-18 ENCOUNTER — Inpatient Hospital Stay (HOSPITAL_BASED_OUTPATIENT_CLINIC_OR_DEPARTMENT_OTHER): Payer: Medicare PPO | Admitting: Oncology

## 2019-04-18 ENCOUNTER — Ambulatory Visit (INDEPENDENT_AMBULATORY_CARE_PROVIDER_SITE_OTHER): Payer: Medicare PPO | Admitting: *Deleted

## 2019-04-18 ENCOUNTER — Encounter: Payer: Self-pay | Admitting: *Deleted

## 2019-04-18 DIAGNOSIS — I495 Sick sinus syndrome: Secondary | ICD-10-CM

## 2019-04-18 DIAGNOSIS — C3490 Malignant neoplasm of unspecified part of unspecified bronchus or lung: Secondary | ICD-10-CM | POA: Diagnosis not present

## 2019-04-18 LAB — CUP PACEART REMOTE DEVICE CHECK
Battery Remaining Longevity: 171 mo
Battery Voltage: 3.21 V
Brady Statistic AP VP Percent: 0.03 %
Brady Statistic AP VS Percent: 69.93 %
Brady Statistic AS VP Percent: 0.02 %
Brady Statistic AS VS Percent: 30.03 %
Brady Statistic RA Percent Paced: 68.98 %
Brady Statistic RV Percent Paced: 0.04 %
Date Time Interrogation Session: 20210225181445
Implantable Lead Implant Date: 19941110
Implantable Lead Implant Date: 19941110
Implantable Lead Location: 753859
Implantable Lead Location: 753860
Implantable Lead Model: 5034
Implantable Lead Model: 5534
Implantable Pulse Generator Implant Date: 20201012
Lead Channel Impedance Value: 665 Ohm
Lead Channel Impedance Value: 779 Ohm
Lead Channel Impedance Value: 779 Ohm
Lead Channel Impedance Value: 874 Ohm
Lead Channel Pacing Threshold Amplitude: 0.5 V
Lead Channel Pacing Threshold Amplitude: 0.5 V
Lead Channel Pacing Threshold Pulse Width: 0.4 ms
Lead Channel Pacing Threshold Pulse Width: 0.4 ms
Lead Channel Sensing Intrinsic Amplitude: 1 mV
Lead Channel Sensing Intrinsic Amplitude: 1 mV
Lead Channel Sensing Intrinsic Amplitude: 10.125 mV
Lead Channel Sensing Intrinsic Amplitude: 10.125 mV
Lead Channel Setting Pacing Amplitude: 1.5 V
Lead Channel Setting Pacing Amplitude: 2.5 V
Lead Channel Setting Pacing Pulse Width: 0.4 ms
Lead Channel Setting Sensing Sensitivity: 4 mV

## 2019-04-18 NOTE — Progress Notes (Signed)
PPM Remote  

## 2019-04-18 NOTE — Progress Notes (Signed)
[place]  [phone] [phone]  Patient Care Team: Pleas Koch, NP as PCP - General (Internal Medicine) Deboraha Sprang, MD as Consulting Physician (Cardiology) Marlowe Sax, MD as Referring Physician (Internal Medicine) Birder Robson, MD as Referring Physician (Ophthalmology) Oneta Rack, MD as Referring Physician (Dermatology) Salli Real, DDS as Referring Physician (Dentistry) Telford Nab, RN as Registered Nurse   Name of the patient: Darryl White  [id number]  03/02/1940   Date of visit: 04/18/19  Diagnosis- Lung Mass  Chief complaint/Reason for visit- Initial Meeting for [facility], preparing for starting chemotherapy  Heme/Onc history:  Oncology History  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 Initial Diagnosis   Small cell lung cancer ([place])   04/23/2019 -  Chemotherapy   The patient had palonosetron (ALOXI) injection 0.25 mg, 0.25 mg, Intravenous,  Once, 0 of 4 cycles pegfilgrastim-jmdb (FULPHILA) injection 6 mg, 6 mg, Subcutaneous,  Once, 0 of 4 cycles CARBOplatin (PARAPLATIN) in sodium chloride 0.9 % 100 mL chemo infusion, , Intravenous,  Once, 0 of 4 cycles etoposide (VEPESID) 190 mg in sodium chloride 0.9 % 500 mL chemo infusion, 100 mg/m2, Intravenous,  Once, 0 of 4 cycles fosaprepitant (EMEND) 150 mg in sodium chloride 0.9 % 145 mL IVPB, 150 mg, Intravenous,  Once, 0 of 4 cycles  for chemotherapy treatment.      Interval history-Darryl White is a 79 year old male who presents to chemo care clinic today for initial meeting in preparation for starting chemotherapy. I introduced the chemo care clinic and we discussed that the role of the clinic is to assist those who are at an increased risk of emergency room visits and/or complications during the course of chemotherapy treatment. We discussed that the increased risk takes into account factors such as age,  performance status, and co-morbidities. We also discussed that for some, this might include barriers to care such as not having a primary care provider, lack of insurance/transportation, or not being able to afford medications. We discussed that the goal of the program is to help prevent unplanned ER visits and help reduce complications during chemotherapy. We do this by discussing specific risk factors to each individual and identifying ways that we can help improve these risk factors and reduce barriers to care.   ECOG FS:1 - Symptomatic but completely ambulatory  Review of systems- Review of Systems  Constitutional: Negative.  Negative for chills, fever, malaise/fatigue and weight loss.  HENT: Negative for congestion, ear pain and tinnitus.   Eyes: Negative.  Negative for blurred vision and double vision.  Respiratory: Positive for cough and shortness of breath. Negative for sputum production.   Cardiovascular: Negative.  Negative for chest pain, palpitations and leg swelling.  Gastrointestinal: Negative.  Negative for abdominal pain, constipation, diarrhea, nausea and vomiting.  Genitourinary: Negative for dysuria, frequency and urgency.  Musculoskeletal: Negative for back pain and falls.  Skin: Negative.  Negative for rash.  Neurological: Negative.  Negative for weakness and headaches.  Endo/Heme/Allergies: Negative.  Does not bruise/bleed easily.  Psychiatric/Behavioral: Negative.  Negative for depression. The patient is not nervous/anxious and does not have insomnia.      Current treatment- Beginning treatment carbo/etopiside on 04/23/19  Allergies  Allergen Reactions  . Sulfa Antibiotics Hives and Itching  . Penicillins Hives and Palpitations    Did it involve swelling of the face/tongue/throat, SOB, or low BP? No Did it involve sudden or severe rash/hives, skin peeling, or any reaction  on the inside of your mouth or nose? No Did you need to seek medical attention at a hospital or  doctor's office? Unknown When did it last happen?childhood allergy If all above answers are "NO", may proceed with cephalosporin use.     Past Medical History:  Diagnosis Date  . Arthritis 2019   dx of osteoarthritis and rhemautoid arthritis  . Cancer ([place])    lung  . Cataract 2019   left eye  . Chicken pox   . Collagen vascular disease ([place])   . 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 COPD (chronic obstructive pulmonary disease) ([place])   . Diverticulitis   . GERD (gastroesophageal reflux disease)   . 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 History of hiatal hernia   . History of stomach ulcers   . Hyperlipidemia   . Lymphocytic colitis   . Orthostatic hypotension   . Osteoarthritis   . Pancreatitis   . Paroxysmal atrial fibrillation (H[facility])   . Presence of permanent cardiac pacemaker   . Small cell lung cancer ([place]) 04/15/2019    Past Surgical History:  Procedure Laterality Date  . APPENDECTOMY    . BACK SURGERY     bone chip lasered  . CARDIAC CATHETERIZATION    . CATARACT EXTRACTION W/ INTRAOCULAR LENS IMPLANT Right   . COLON SURGERY  1990's   12" removed of large intestine  . EYE SURGERY    . INSERT / REPLACE / [place]  . large intestine block    . PACEMAKER INSERTION     MDT dual chamber pacemaker  . PORTA CATH INSERTION N/A 04/17/2019   Procedure: PORTA CATH INSERTION;  Surgeon: Algernon Huxley, MD;  Location: [facility];  Service: Cardiovascular;  Laterality: N/A;  . PPM GENERATOR CHANGEOUT N/A 12/02/2018   Procedure: PPM GENERATOR CHANGEOUT;  Surgeon: Deboraha Sprang, MD;  Location: [facility];  Service: Cardiovascular;  Laterality: N/A;  . TONSILLECTOMY AND ADENOIDECTOMY    . VIDEO BRONCHOSCOPY WITH ENDOBRONCHIAL ULTRASOUND N/A 04/07/2019   Procedure: VIDEO BRONCHOSCOPY WITH ENDOBRONCHIAL ULTRASOUND;  Surgeon: Tyler Pita, MD;  Location: [facility];  Service: Pulmonary;  Laterality: N/A;    Social  History   Socioeconomic History  . Marital status: Married    Spouse name: Not on file  . Number of children: 2  . Years of education: Not on file  . Highest education level: Not on file  Occupational History  . Occupation: Retired  Tobacco Use  . Smoking status: Former Smoker    Packs/day: 1.00    Years: 60.00    Pack years: 60.00    Types: Cigarettes    Quit date: 04/29/2017    Years since quitting: 1.9  . Smokeless tobacco: Never Used  Substance and Sexual Activity  . Alcohol use: Yes    Alcohol/week: 5.0 standard drinks    Types: 5 Glasses of wine per week  . Drug use: No  . Sexual activity: Never  Other Topics Concern  . Not on file  Social History Narrative   Married.   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      Does not have a living will.   Desires CPR, does not want prolonged life support if futile.   Social Determinants of Health   Financial Resource Strain: Low Risk   . Difficulty of Paying Living Expenses: Not hard at all  Food Insecurity: No Food  Insecurity  . Worried About Charity fundraiser in the Last Year: Never true  . Ran Out of Food in the Last Year: Never true  Transportation Needs: No Transportation Needs  . Lack of Transportation (Medical): No  . Lack of Transportation (Non-Medical): No  Physical Activity: Sufficiently Active  . Days of Exercise per Week: 7 days  . Minutes of Exercise per Session: 60 min  Stress: No Stress Concern Present  . Feeling of Stress : Not at all  Social Connections:   . Frequency of Communication with Friends and Family: Not on file  . Frequency of Social Gatherings with Friends and Family: Not on file  . Attends Religious Services: Not on file  . Active Member of Clubs or Organizations: Not on file  . Attends Archivist Meetings: Not on file  . Marital Status: Not on file  Intimate Partner Violence: Not At Risk  . Fear of Current or Ex-Partner: No  . Emotionally Abused: No  .  Physically Abused: No  . Sexually Abused: No    Family History  Problem Relation Age of Onset  . Lung cancer Mother   . Heart disease Father   . Alzheimer's disease Father      Current Outpatient Medications:  .  albuterol (PROAIR HFA) 108 (90 Base) MCG/ACT inhaler, Inhale 1-2 puffs into the lungs every 6 (six) hours as needed for wheezing or shortness of breath., Disp: 1 Inhaler, Rfl: 5 .  antiseptic oral rinse (BIOTENE) LIQD, 15 mLs by Mouth Rinse route as needed for dry mouth., Disp: , Rfl:  .  brimonidine (ALPHAGAN) 0.15 % ophthalmic solution, Place 1 drop into both eyes 2 (two) times daily. , Disp: , Rfl:  .  Budeson-Glycopyrrol-Formoterol (BREZTRI AEROSPHERE) 160-9-4.8 MCG/ACT AERO, Inhale 2 puffs into the lungs 2 (two) times daily., Disp: 10.7 g, Rfl: 6 .  [START ON 04/23/2019] chlorhexidine (PERIDEX) 0.12 % solution, Use as directed 15 mLs in the mouth or throat in the morning and at bedtime., Disp: 473 mL, Rfl: 0 .  Cholecalciferol (VITAMIN D3) 2000 UNITS capsule, Take 2,000 Units by mouth daily with lunch. , Disp: , Rfl:  .  dexamethasone (DECADRON) 4 MG tablet, Take 2 tablets (8 mg total) by mouth daily. Start the day after chemotherapy for 1 day., Disp: 30 tablet, Rfl: 1 .  dextromethorphan-guaiFENesin (MUCINEX DM) 30-600 MG 12hr tablet, Take 1 tablet by mouth 2 (two) times daily as needed (congestion/cough). , Disp: , Rfl:  .  ELIQUIS 5 MG TABS tablet, TAKE 1 TABLET BY MOUTH TWICE DAILY (Patient taking differently: Take 5 mg by mouth 2 (two) times daily. ), Disp: 60 tablet, Rfl: 6 .  famotidine (PEPCID) 20 MG tablet, Take 20 mg by mouth 2 (two) times daily., Disp: , Rfl:  .  fenofibrate (TRICOR) 145 MG tablet, Take 1 tablet (145 mg total) by mouth daily., Disp: 90 tablet, Rfl: 1 .  folic acid (FOLVITE) 1 MG tablet, Take 1 mg by mouth daily., Disp: , Rfl:  .  gabapentin (NEURONTIN) 100 MG capsule, Take 100 mg by mouth 3 (three) times daily., Disp: , Rfl:  .  lidocaine-prilocaine  (EMLA) cream, Apply to affected area once, Disp: 30 g, Rfl: 3 .  magic mouthwash SOLN, Take 5 mLs by mouth 4 (four) times daily., Disp: , Rfl:  .  methotrexate 2.5 MG tablet, Take 12.5 mg by mouth every Sunday. , Disp: , Rfl:  .  ondansetron (ZOFRAN) 8 MG tablet, Take 1 tablet (8  mg total) by mouth 2 (two) times daily as needed for refractory nausea / vomiting. Start on day 3 after carboplatin chemo., Disp: 30 tablet, Rfl: 1 .  prochlorperazine (COMPAZINE) 10 MG tablet, Take 1 tablet (10 mg total) by mouth every 6 (six) hours as needed (Nausea or vomiting)., Disp: 30 tablet, Rfl: 1 .  sodium chloride (OCEAN) 0.65 % SOLN nasal spray, Place 1 spray into both nostrils at bedtime as needed for congestion. , Disp: , Rfl:  .  traMADol (ULTRAM) 50 MG tablet, Take 25 mg by mouth at bedtime as needed for moderate pain. , Disp: , Rfl:   Physical exam: There were no vitals filed for this visit. Physical Exam Constitutional:      Appearance: Normal appearance.  HENT:     Head: Normocephalic and atraumatic.  Eyes:     Pupils: Pupils are equal, round, and reactive to light.  Cardiovascular:     Rate and Rhythm: Normal rate and regular rhythm.     Heart sounds: Normal heart sounds. No murmur.  Pulmonary:     Effort: Pulmonary effort is normal.     Breath sounds: Normal breath sounds. No wheezing.  Abdominal:     General: Bowel sounds are normal. There is no distension.     Palpations: Abdomen is soft.     Tenderness: There is no abdominal tenderness.  Musculoskeletal:        General: Normal range of motion.     Cervical back: Normal range of motion.  Skin:    General: Skin is warm and dry.     Findings: No rash.  Neurological:     Mental Status: He is alert and oriented to person, place, and time.  Psychiatric:        Judgment: Judgment normal.      CMP Latest Ref Rng & Units 04/03/2019  Glucose 70 - 99 mg/dL 108(H)  BUN 8 - 23 mg/dL 18  Creatinine 0.61 - 1.24 mg/dL 1.24  Sodium 135 - 145  mmol/L 133(L)  Potassium 3.5 - 5.1 mmol/L 4.8  Chloride 98 - 111 mmol/L 101  CO2 22 - 32 mmol/L 24  Calcium 8.9 - 10.3 mg/dL 9.3  Total Protein 6.5 - 8.1 g/dL 7.0  Total Bilirubin 0.3 - 1.2 mg/dL 1.2  Alkaline Phos 38 - 126 U/L 60  AST 15 - 41 U/L 53(H)  ALT 0 - 44 U/L 31   CBC Latest Ref Rng & Units 03/13/2019  WBC 4.0 - 10.5 K/uL 6.2  Hemoglobin 13.0 - 17.0 g/dL 14.7  Hematocrit 39.0 - 52.0 % 43.7  Platelets 150.0 - 400.0 K/uL 186.0    No images are attached to the encounter.  CT Head W Wo Contrast  Result Date: 04/04/2019 CLINICAL DATA:  Lung mass staging.  No CNS symptoms per report EXAM: CT HEAD WITHOUT AND WITH CONTRAST TECHNIQUE: Contiguous axial images were obtained from the base of the skull through the vertex without and with intravenous contrast CONTRAST:  15mL OMNIPAQUE IOHEXOL 300 MG/ML  SOLN COMPARISON:  None. FINDINGS: Brain: No evidence of acute infarction, hemorrhage, hydrocephalus, extra-axial collection or mass lesion/mass effect. No abnormal intracranial enhancement Vascular: Major vessels are enhancing Skull: Negative for fracture or bone lesion Sinuses/Orbits: Negative IMPRESSION: Negative.  No evidence of metastatic disease. Electronically Signed   By: Monte Fantasia M.D.   On: 04/04/2019 10:06   PERIPHERAL VASCULAR CATHETERIZATION  Result Date: 04/17/2019 See op note  NM PET Image Restag (PS) Skull Base To Thigh  Result  Date: 03/20/2019 CLINICAL DATA:  Subsequent treatment strategy for non-small cell lung cancer. EXAM: NUCLEAR MEDICINE PET SKULL BASE TO THIGH TECHNIQUE: 9.3 mCi F-18 FDG was injected intravenously. Full-ring PET imaging was performed from the skull base to thigh after the radiotracer. CT data was obtained and used for attenuation correction and anatomic localization. Fasting blood glucose: 97 mg/dl COMPARISON:  Multiple exams, including CT chest 02/27/2019 and prior PET-CT from 02/18/2018 FINDINGS: Mediastinal blood pool activity: SUV max 2.7  Liver activity: SUV max NA NECK: Bilateral hypermetabolic posterior parotid nodules are present, partially obscured on the CT data due to streak artifact from the patient's fillings. The left posterior nodule has a maximum SUV of 6.6 (formerly 10.3) and the right posterior parotid nodule has maximum SUV of 7.9 (formerly 9.8). Incidental CT findings: Bilateral common carotid atherosclerotic calcification. CHEST: The right paratracheal node anterior to the carina measures 2.3 cm in short axis on image 89/3 (formerly measured at 2.1 cm on 02/27/2019 and about 0.7 cm on 02/18/2018) with maximum SUV 10.8, compatible with malignancy. The small lymph nodes in this vicinity on the prior PET-CT were not hypermetabolic. The peripheral right upper lobe subpleural nodule on image 93/3 measures 1.4 by 2.4 cm, essentially stable from 02/27/2019 but increased in size from the 02/18/2018 exam. Maximum SUV 12.4, previously 4.3 on 02/18/2018. A subcarinal lymph node measuring 1.2 cm in short axis on image 96/3 is stable from 02/27/2019, and has a maximum SUV of 4.6 (previously not hypermetabolic). Along the confluent interstitial opacity in the left upper lobe adjacent to the major fissure, there is a 1.2 by 1.0 cm nodule on image 100/3 which is roughly stable from the 02/27/2019 exam, with maximum SUV 3.5, suspicious for malignancy. There is some hazy accentuated metabolic activity throughout the adjacent ground-glass opacity in the left upper lobe and left lower lobe. Along the medial margin of the right pectoralis minor muscle there is a small focus of accentuated metabolic activity which may be from an adjacent lymph node, measuring with maximum SUV of 3.5. It is possible that this simply represents physiologic muscular activity. Incidental CT findings: Pacer/defibrillator device noted. Coronary, aortic arch, and branch vessel atherosclerotic vascular disease. Mild cardiomegaly. Small type 1 hiatal hernia. Peripheral interstitial  accentuation in the lungs favors fibrosis. Extensive centrilobular emphysema. ABDOMEN/PELVIS: No significant abnormal hypermetabolic activity in this region. Incidental CT findings: Aortoiliac atherosclerotic vascular disease. Exophytic hyperdense 1.2 by 0.9 cm lesion of the left kidney lower pole is photopenic and likely a small complex but benign cyst. Scattered colonic diverticula. SKELETON: No significant abnormal hypermetabolic activity in this region. Incidental CT findings: none IMPRESSION: 1. Hypermetabolic right lower paratracheal and subcarinal adenopathy with hypermetabolic right upper lobe subpleural nodule, all significantly worsened compared to the prior PET-CT, but roughly similar in size to the chest CT from 3 weeks ago. 2. A 1.2 by 1.0 cm nodule in the left upper lobe adjacent to confluent ground-glass densities is stable in size from 3 weeks ago but new from 02/18/2018, with maximum SUV of 3.5, potentially from metastatic disease or confluent inflammatory condition. 3. A tiny focus of hypermetabolic activity along the right upper pectoralis minor muscle medial margin could be from a small hypermetabolic lymph node or simply due to physiologic activity in the muscle. 4. Bilateral parotid nodules, moderately reduced in activity compared to the prior exam, appearance favoring Warthin's tumors. 5. Other imaging findings of potential clinical significance: Aortic Atherosclerosis (ICD10-I70.0) and Emphysema (ICD10-J43.9). Coronary atherosclerosis with mild cardiomegaly. Small type 1  hiatal hernia. Suspected fibrosis in the lungs. Left kidney lower pole hyperdense lesion is likely a complex cyst. Electronically Signed   By: Van Clines M.D.   On: 03/20/2019 12:08   CUP PACEART REMOTE DEVICE CHECK  Result Date: 04/18/2019 Scheduled remote reviewed.  Normal device function.  Next remote 91 days.    Assessment and plan- Patient is a 79 y.o. male who [facility] for initial  meeting in preparation for starting chemotherapy for the treatment of lung cancer.   1. HPI: He initially presented to Dr. Donella Stade for a right lung nodule concerning for progressive malignancy.  He had progressively enlarged imaging of right lower lobe which was previously treated in April 2020 with SBRT.  Biopsy was not done due to poor lung function secondary to COPD/moderate fibrosis.  Patient also had a right upper lobe nodule being closely watched and was found to have progressively enlarged on 11/08/2018 CT scan.  On 02/27/2019 patient had another CT which showed marked progression of precorneal lymph node and continued progression of peripheral right upper lobe pulmonary nodule.  PET scan from 03/20/2019 showed hypermetabolic right lower paratracheal and subcarinal adenopathy with hypermetabolic right upper lobe subpleural nodule.  2. Chemo Care Clinic/High Risk for ER/Hospitalization during chemotherapy- We discussed the role of the chemo care clinic and identified patient specific risk factors. I discussed that patient was identified as high risk primarily based on: Stage of disease and age  Patient has past medical history positive for: Past Medical History:  Diagnosis Date  . Arthritis 2019   dx of osteoarthritis and rhemautoid arthritis  . Cancer ([place])    lung  . Cataract 2019   left eye  . Chicken pox   . Collagen vascular disease ([place])   . 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 COPD (chronic obstructive pulmonary disease) ([place])   . Diverticulitis   . GERD (gastroesophageal reflux disease)   . 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 History of hiatal hernia   . History of stomach ulcers   . Hyperlipidemia   . Lymphocytic colitis   . Orthostatic hypotension   . Osteoarthritis   . Pancreatitis   . Paroxysmal atrial fibrillation ([facility])   . Presence of permanent cardiac pacemaker   . 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 APPENDECTOMY    . BACK SURGERY     bone chip lasered  . CARDIAC CATHETERIZATION    . CATARACT EXTRACTION W/ INTRAOCULAR LENS IMPLANT Right   . COLON SURGERY  1990's   12" removed of large intestine  . EYE SURGERY    . INSERT / REPLACE / [address]  . large intestine block    . PACEMAKER INSERTION     MDT dual chamber pacemaker  . PORTA CATH INSERTION N/A 04/17/2019   Procedure: PORTA CATH INSERTION;  Surgeon: Algernon Huxley, MD;  Location: [facility];  Service: Cardiovascular;  Laterality: N/A;  . PPM GENERATOR CHANGEOUT N/A 12/02/2018   Procedure: PPM GENERATOR CHANGEOUT;  Surgeon: Deboraha Sprang, MD;  Location: [place] [facility];  Service: Cardiovascular;  Laterality: N/A;  . TONSILLECTOMY AND ADENOIDECTOMY    . VIDEO BRONCHOSCOPY WITH ENDOBRONCHIAL ULTRASOUND N/A 04/07/2019   Procedure: VIDEO BRONCHOSCOPY WITH ENDOBRONCHIAL ULTRASOUND;  Surgeon: Tyler Pita, MD;  Location: [facility];  Service: Pulmonary;  Laterality: N/A;    Based on our high risk symptom management report; this patient has a high risk of ED utilization.  The percentage below indicates how "at risk "  this patient based on the factors in this table within one year.   General Risk Score: 5  Values used to calculate this score:   Points  Metrics      1        Age: 20      [facility] Admissions: 3      0        ED Visits: 0      1        Has Chronic Obstructive Pulmonary Disease: Yes      0        Has Diabetes: No      0        Has Congestive Heart Failure: No      0        Has liver disease: No      0        Has Depression: No      0        Current PCP: Pleas Koch, NP      0        Has Medicaid: No   3. We discussed that social determinants of health may have significant impacts on health and outcomes for cancer patients.  Today we discussed specific social determinants of performance status, alcohol  use, depression, financial needs, food insecurity, housing, interpersonal violence, social connections, stress, tobacco use, and transportation.    After lengthy discussion the following were identified as areas of need: None at this time  Outpatient services: We discussed options including home based and outpatient services, DME and care program. We discusssed that patients who participate in regular physical activity report fewer negative impacts of cancer and treatments and report less fatigue.   Financial Concerns: We discussed that living with cancer can create tremendous financial burden.  We discussed options for assistance. I asked that if assistance is needed in affording medications or paying bills to please let us know so that we can provide assistance. We discussed options for food including social services, [employer] ($50 every 2 weeks) and onsite food pantry.  We will also notify Barnabas Lister [facility] to see if cancer center can provide additional support.  Referral to Social work: Introduced Education officer, museum Elease Etienne and the services he can provide such as support with [employer], cell phone and gas vouchers.   Support groups: We discussed options for support groups at the cancer center. If interested, please notify nurse navigator to enroll. We discussed options for managing stress including healthy eating, exercise as well as participating in no charge counseling services at the cancer center and support groups.  If these are of interest, patient can notify either myself or primary nursing team.We discussed options for management including medications and referral to quit Smart program  Transportation: We discussed options for transportation including acta, paratransit, bus routes, link transit, taxi/uber/lyft, and cancer center [place].  I have notified primary oncology team who will help assist with arranging Lucianne Lei transportation for appointments when/if needed. We also discussed  options for transportation on short notice/acute visits.  Palliative care services: We have palliative care services available in the cancer center to discuss goals of care and advanced care planning.  Please let us know if you have  any questions or would like to speak to our palliative nurse practitioner.  Symptom Management Clinic: We discussed our symptom management clinic which is available for acute concerns while receiving treatment such as nausea, vomiting or diarrhea.  We can be reached via telephone at [phone] or through my chart.  We are available for virtual or in person visits on the same day from 830 to 4 PM Monday through Friday. He denies needing specific assistance at this time and He will be followed by Dr. Collie Siad clinical team.  Plan: Discussed symptom management clinic. Discussed palliative care services. Discussed resources that are available here at the cancer center. Discussed medications and new prescriptions to begin treatment such as anti-nausea or steroids.   Disposition: RTC on 04/23/19 to begin radiation, labs, assessment with Dr. Tasia Catchings prior to cycle carbo/etopiside.  Visit Diagnosis No diagnosis found.  Patient expressed understanding and was in agreement with this plan. He also understands that He can call clinic at any time with any questions, concerns, or complaints.   Greater than 50% was spent in counseling and coordination of care with this patient including but not limited to discussion of the relevant topics above (See A&P) including, but not limited to diagnosis and management of acute and chronic medical conditions. ,  [facility]  CC: Dr. Tasia Catchings

## 2019-04-21 NOTE — Progress Notes (Signed)
  Oncology Nurse Navigator Documentation  Navigator Location: [facility] (04/18/19 1226)   )Navigator Encounter Type: Lobby (04/18/19 1226)                         Barriers/Navigation Needs: Coordination of Care (04/18/19 1226)   Interventions: Coordination of Care (04/18/19 1226)   Coordination of Care: Appts (04/18/19 1226)       met with patient and his wife before chemotherapy education. All questions answered during visit. Reviewed upcoming appts. Nothing further needed at this time. Instructed to call with any further questions or needs. Pt verbalized understanding. Will follow up at next clinic visit.           Time Spent with Patient: 30 (04/18/19 1226)

## 2019-04-23 ENCOUNTER — Inpatient Hospital Stay: Payer: Medicare PPO | Attending: Oncology

## 2019-04-23 ENCOUNTER — Inpatient Hospital Stay: Payer: Medicare PPO

## 2019-04-23 ENCOUNTER — Inpatient Hospital Stay: Payer: Medicare PPO | Admitting: Oncology

## 2019-04-23 ENCOUNTER — Inpatient Hospital Stay (HOSPITAL_BASED_OUTPATIENT_CLINIC_OR_DEPARTMENT_OTHER): Payer: Medicare PPO | Admitting: Hospice and Palliative Medicine

## 2019-04-23 ENCOUNTER — Ambulatory Visit: Admission: RE | Admit: 2019-04-23 | Payer: Medicare PPO | Source: Ambulatory Visit

## 2019-04-23 ENCOUNTER — Encounter: Payer: Self-pay | Admitting: Oncology

## 2019-04-23 ENCOUNTER — Other Ambulatory Visit: Payer: Self-pay

## 2019-04-23 ENCOUNTER — Encounter: Payer: Self-pay | Admitting: *Deleted

## 2019-04-23 VITALS — BP 123/84 | HR 70 | Temp 97.6°F | Resp 18 | Wt 171.3 lb

## 2019-04-23 DIAGNOSIS — Z8249 Family history of ischemic heart disease and other diseases of the circulatory system: Secondary | ICD-10-CM | POA: Insufficient documentation

## 2019-04-23 DIAGNOSIS — E871 Hypo-osmolality and hyponatremia: Secondary | ICD-10-CM | POA: Diagnosis not present

## 2019-04-23 DIAGNOSIS — T451X5A Adverse effect of antineoplastic and immunosuppressive drugs, initial encounter: Secondary | ICD-10-CM | POA: Insufficient documentation

## 2019-04-23 DIAGNOSIS — R040 Epistaxis: Secondary | ICD-10-CM | POA: Insufficient documentation

## 2019-04-23 DIAGNOSIS — C3411 Malignant neoplasm of upper lobe, right bronchus or lung: Secondary | ICD-10-CM | POA: Diagnosis present

## 2019-04-23 DIAGNOSIS — Z5111 Encounter for antineoplastic chemotherapy: Secondary | ICD-10-CM

## 2019-04-23 DIAGNOSIS — J8410 Pulmonary fibrosis, unspecified: Secondary | ICD-10-CM | POA: Diagnosis not present

## 2019-04-23 DIAGNOSIS — Z801 Family history of malignant neoplasm of trachea, bronchus and lung: Secondary | ICD-10-CM | POA: Insufficient documentation

## 2019-04-23 DIAGNOSIS — Z7901 Long term (current) use of anticoagulants: Secondary | ICD-10-CM | POA: Diagnosis not present

## 2019-04-23 DIAGNOSIS — K219 Gastro-esophageal reflux disease without esophagitis: Secondary | ICD-10-CM | POA: Diagnosis not present

## 2019-04-23 DIAGNOSIS — D701 Agranulocytosis secondary to cancer chemotherapy: Secondary | ICD-10-CM | POA: Insufficient documentation

## 2019-04-23 DIAGNOSIS — Z79899 Other long term (current) drug therapy: Secondary | ICD-10-CM | POA: Diagnosis not present

## 2019-04-23 DIAGNOSIS — E785 Hyperlipidemia, unspecified: Secondary | ICD-10-CM | POA: Diagnosis not present

## 2019-04-23 DIAGNOSIS — Z818 Family history of other mental and behavioral disorders: Secondary | ICD-10-CM | POA: Insufficient documentation

## 2019-04-23 DIAGNOSIS — C3491 Malignant neoplasm of unspecified part of right bronchus or lung: Secondary | ICD-10-CM | POA: Diagnosis not present

## 2019-04-23 DIAGNOSIS — M1990 Unspecified osteoarthritis, unspecified site: Secondary | ICD-10-CM | POA: Insufficient documentation

## 2019-04-23 DIAGNOSIS — R911 Solitary pulmonary nodule: Secondary | ICD-10-CM | POA: Diagnosis not present

## 2019-04-23 DIAGNOSIS — Z7289 Other problems related to lifestyle: Secondary | ICD-10-CM | POA: Diagnosis not present

## 2019-04-23 DIAGNOSIS — R5383 Other fatigue: Secondary | ICD-10-CM | POA: Diagnosis not present

## 2019-04-23 DIAGNOSIS — C3490 Malignant neoplasm of unspecified part of unspecified bronchus or lung: Secondary | ICD-10-CM | POA: Diagnosis not present

## 2019-04-23 DIAGNOSIS — M069 Rheumatoid arthritis, unspecified: Secondary | ICD-10-CM | POA: Insufficient documentation

## 2019-04-23 DIAGNOSIS — Z515 Encounter for palliative care: Secondary | ICD-10-CM | POA: Diagnosis not present

## 2019-04-23 DIAGNOSIS — I480 Paroxysmal atrial fibrillation: Secondary | ICD-10-CM | POA: Diagnosis not present

## 2019-04-23 DIAGNOSIS — R531 Weakness: Secondary | ICD-10-CM | POA: Diagnosis not present

## 2019-04-23 DIAGNOSIS — Z880 Allergy status to penicillin: Secondary | ICD-10-CM | POA: Insufficient documentation

## 2019-04-23 DIAGNOSIS — D7589 Other specified diseases of blood and blood-forming organs: Secondary | ICD-10-CM | POA: Diagnosis not present

## 2019-04-23 DIAGNOSIS — J449 Chronic obstructive pulmonary disease, unspecified: Secondary | ICD-10-CM | POA: Diagnosis not present

## 2019-04-23 DIAGNOSIS — Z87891 Personal history of nicotine dependence: Secondary | ICD-10-CM | POA: Insufficient documentation

## 2019-04-23 DIAGNOSIS — Z882 Allergy status to sulfonamides status: Secondary | ICD-10-CM | POA: Diagnosis not present

## 2019-04-23 DIAGNOSIS — D696 Thrombocytopenia, unspecified: Secondary | ICD-10-CM | POA: Diagnosis not present

## 2019-04-23 LAB — COMPREHENSIVE METABOLIC PANEL
ALT: 28 U/L (ref 0–44)
AST: 50 U/L — ABNORMAL HIGH (ref 15–41)
Albumin: 3.8 g/dL (ref 3.5–5.0)
Alkaline Phosphatase: 68 U/L (ref 38–126)
Anion gap: 7 (ref 5–15)
BUN: 18 mg/dL (ref 8–23)
CO2: 24 mmol/L (ref 22–32)
Calcium: 9.2 mg/dL (ref 8.9–10.3)
Chloride: 103 mmol/L (ref 98–111)
Creatinine, Ser: 1.19 mg/dL (ref 0.61–1.24)
GFR calc Af Amer: >60 mL/min (ref >60)
GFR calc non Af Amer: 58 mL/min — ABNORMAL LOW (ref >60)
Glucose, Bld: 116 mg/dL — ABNORMAL HIGH (ref 70–99)
Potassium: 4.2 mmol/L (ref 3.5–5.1)
Sodium: 134 mmol/L — ABNORMAL LOW (ref 135–145)
Total Bilirubin: 0.9 mg/dL (ref 0.3–1.2)
Total Protein: 6.8 g/dL (ref 6.5–8.1)

## 2019-04-23 LAB — CBC WITH DIFFERENTIAL/PLATELET
Abs Immature Granulocytes: 0.01 10*3/uL (ref 0.00–0.07)
Basophils Absolute: 0 10*3/uL (ref 0.0–0.1)
Basophils Relative: 1 %
Eosinophils Absolute: 0.1 10*3/uL (ref 0.0–0.5)
Eosinophils Relative: 3 %
HCT: 40.7 % (ref 39.0–52.0)
Hemoglobin: 13.4 g/dL (ref 13.0–17.0)
Immature Granulocytes: 0 %
Lymphocytes Relative: 18 %
Lymphs Abs: 0.8 10*3/uL (ref 0.7–4.0)
MCH: 34.7 pg — ABNORMAL HIGH (ref 26.0–34.0)
MCHC: 32.9 g/dL (ref 30.0–36.0)
MCV: 105.4 fL — ABNORMAL HIGH (ref 80.0–100.0)
Monocytes Absolute: 0.4 10*3/uL (ref 0.1–1.0)
Monocytes Relative: 10 %
Neutro Abs: 2.8 10*3/uL (ref 1.7–7.7)
Neutrophils Relative %: 68 %
Platelets: 148 10*3/uL — ABNORMAL LOW (ref 150–400)
RBC: 3.86 MIL/uL — ABNORMAL LOW (ref 4.22–5.81)
RDW: 14.5 % (ref 11.5–15.5)
WBC: 4.1 10*3/uL (ref 4.0–10.5)
nRBC: 0 % (ref 0.0–0.2)

## 2019-04-23 MED ORDER — HEPARIN SOD (PORK) LOCK FLUSH 100 UNIT/ML IV SOLN
INTRAVENOUS | Status: AC
  Filled 2019-04-23: qty 5

## 2019-04-23 MED ORDER — HEPARIN SOD (PORK) LOCK FLUSH 100 UNIT/ML IV SOLN
500.0000 [IU] | Freq: Once | INTRAVENOUS | Status: AC
  Administered 2019-04-23: 500 [IU] via INTRAVENOUS
  Filled 2019-04-23: qty 5

## 2019-04-23 MED ORDER — SODIUM CHLORIDE 0.9 % IV SOLN
Freq: Once | INTRAVENOUS | Status: AC
  Filled 2019-04-23: qty 250

## 2019-04-23 MED ORDER — PALONOSETRON HCL INJECTION 0.25 MG/5ML
0.2500 mg | Freq: Once | INTRAVENOUS | Status: AC
  Administered 2019-04-23: 0.25 mg via INTRAVENOUS
  Filled 2019-04-23: qty 5

## 2019-04-23 MED ORDER — SODIUM CHLORIDE 0.9 % IV SOLN
100.0000 mg/m2 | Freq: Once | INTRAVENOUS | Status: AC
  Administered 2019-04-23: 190 mg via INTRAVENOUS
  Filled 2019-04-23: qty 9.5

## 2019-04-23 MED ORDER — SODIUM CHLORIDE 0.9 % IV SOLN
409.5000 mg | Freq: Once | INTRAVENOUS | Status: AC
  Administered 2019-04-23: 410 mg via INTRAVENOUS
  Filled 2019-04-23: qty 41

## 2019-04-23 MED ORDER — SODIUM CHLORIDE 0.9 % IV SOLN
10.0000 mg | Freq: Once | INTRAVENOUS | Status: AC
  Administered 2019-04-23: 10 mg via INTRAVENOUS
  Filled 2019-04-23: qty 10

## 2019-04-23 MED ORDER — SODIUM CHLORIDE 0.9% FLUSH
10.0000 mL | INTRAVENOUS | Status: DC | PRN
  Administered 2019-04-23: 10 mL via INTRAVENOUS
  Filled 2019-04-23: qty 10

## 2019-04-23 MED ORDER — SODIUM CHLORIDE 0.9 % IV SOLN
150.0000 mg | Freq: Once | INTRAVENOUS | Status: AC
  Administered 2019-04-23: 150 mg via INTRAVENOUS
  Filled 2019-04-23: qty 150

## 2019-04-23 NOTE — Progress Notes (Signed)
  Oncology Nurse Navigator Documentation  Navigator Location: [facility] (04/23/19 1300)   )Navigator Encounter Type: Treatment (04/23/19 1300)                   Treatment Initiated Date: 04/23/19 (04/23/19 1300) Patient Visit Type: MedOnc (04/23/19 1300) Treatment Phase: First Chemo Tx (04/23/19 1300) Barriers/Navigation Needs: No Barriers At This Time (04/23/19 1300)   Interventions: None Required (04/23/19 1300)               met with patient during follow up visit with Dr. Tasia Catchings to initiate chemo treatment. All questions answered during visit. Pt did not have any concerns or needs during visit. Will follow up at visit next week. Pt instructed to call if has any further questions or needs. Pt verbalized understanding.        Time Spent with Patient: 45 (04/23/19 1300)

## 2019-04-23 NOTE — Progress Notes (Signed)
[facility]  [phone] [phone]   Name: Darryl White Date: 04/23/2019 MRN: [id number]  DOB: October 05, 1940  Patient Care Team: Pleas Koch, NP as PCP - General (Internal Medicine) Deboraha Sprang, MD as Consulting Physician (Cardiology) Marlowe Sax, MD as Referring Physician (Internal Medicine) Birder Robson, MD as Referring Physician (Ophthalmology) Oneta Rack, MD as Referring Physician (Dermatology) Salli Real, DDS as Referring Physician (Dentistry) Telford Nab, RN as Oncology Nurse Navigator Earlie Server, MD as Consulting Physician (Hematology and Oncology)    REASON FOR CONSULTATION: [name] is a 79 y.o. male with multiple medical problems including history of lung mass empirically treated with SBRT with deferred biopsy due to COPD/pulmonary fibrosis.  Patient now found to have progressive enlargement of lung malignancy.  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  Patient is being treated aggressively with carboplatin/etoposide.  Palliative care was consulted to help address goals and manage ongoing symptoms.  SOCIAL HISTORY:     reports that he quit smoking about 1 years ago. His smoking use included cigarettes. He has a 60.00 pack-year smoking history. He has never used smokeless tobacco. He reports current alcohol use of about 5.0 standard drinks of alcohol per week. He reports that he does not use drugs.   Patient is married and lives with his wife in a townhome.  Patient moved here some years ago from [place], [place] to be near his son, who lives in [place].  Patient also has a daughter who lives in [place].  Patient retired as an Nature conservation officer. He is active in the [employer]  Retired [employer].   ADVANCE DIRECTIVES:  Not on file  CODE STATUS:   PAST MEDICAL HISTORY: Past Medical History:  Diagnosis Date  . Arthritis 2019   dx of osteoarthritis and rhemautoid arthritis  . Cancer ([facility])    lung  . Cataract 2019   left eye  . Chicken pox   . Collagen vascular disease ([facility])   . 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 COPD (chronic obstructive pulmonary disease) ([facility])   . Diverticulitis   . GERD (gastroesophageal reflux disease)   . 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 History of hiatal hernia   . History of stomach ulcers   . Hyperlipidemia   . Lymphocytic colitis   . Orthostatic hypotension   . Osteoarthritis   . Pancreatitis   . Paroxysmal atrial fibrillation (H[facility])   . Presence of permanent cardiac pacemaker   . 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 APPENDECTOMY    . BACK SURGERY     bone chip lasered  . CARDIAC CATHETERIZATION    . CATARACT EXTRACTION W/ INTRAOCULAR LENS IMPLANT Right   . COLON SURGERY  1990's   12" removed of large intestine  . EYE SURGERY    . INSERT / REPLACE / [facility]  . large intestine block    . PACEMAKER INSERTION     MDT dual chamber pacemaker  . PORTA CATH INSERTION N/A 04/17/2019   Procedure: PORTA CATH INSERTION;  Surgeon: Algernon Huxley, MD;  Location: [facility];  Service: Cardiovascular;  Laterality: N/A;  .  PPM GENERATOR CHANGEOUT N/A 12/02/2018   Procedure: PPM GENERATOR CHANGEOUT;  Surgeon: Deboraha Sprang, MD;  Location: [facility];  Service: Cardiovascular;  Laterality: N/A;  . TONSILLECTOMY AND ADENOIDECTOMY    . 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[facility])   04/23/2019 -  Chemotherapy   The patient had palonosetron (ALOXI) injection 0.25 mg, 0.25 mg, Intravenous,  Once, 1 of 4 cycles Administration: 0.25 mg (04/23/2019) CARBOplatin (PARAPLATIN) 410 mg in sodium chloride 0.9 % 250 mL chemo infusion, 410 mg (100 % of original dose 409.5 mg), Intravenous,  Once, 1 of 4 cycles Dose modification:   (original dose 409.5 mg, Cycle 1) Administration: 410 mg (04/23/2019) etoposide (VEPESID) 190 mg in sodium chloride 0.9 % 500 mL chemo infusion, 100 mg/m2 = 190 mg, Intravenous,  Once, 1 of 4 cycles Administration: 190 mg (04/23/2019) fosaprepitant (EMEND) 150 mg in sodium chloride 0.9 % 145 mL IVPB, 150 mg, Intravenous,  Once, 1 of 4 cycles Administration: 150 mg (04/23/2019)  for chemotherapy treatment.      ALLERGIES:  is allergic to sulfa antibiotics and penicillins.  MEDICATIONS:  Current Outpatient Medications  Medication Sig Dispense Refill  . albuterol (PROAIR HFA) 108 (90 Base) MCG/ACT inhaler Inhale 1-2 puffs into the lungs every 6 (six) hours as needed for wheezing or shortness of breath. 1 Inhaler 5  . antiseptic oral rinse (BIOTENE) LIQD 15 mLs by Mouth Rinse route as needed for dry mouth.    . brimonidine (ALPHAGAN) 0.15 % ophthalmic solution Place 1 drop into both eyes 2 (two) times daily.     . Budeson-Glycopyrrol-Formoterol (BREZTRI AEROSPHERE) 160-9-4.8 MCG/ACT AERO Inhale 2 puffs into the lungs 2 (two) times daily. 10.7 g 6  . chlorhexidine (PERIDEX) 0.12 % solution Use as directed 15 mLs in the mouth or throat in the morning and at bedtime. 473 mL 0  . Cholecalciferol (VITAMIN D3) 2000 UNITS capsule Take 2,000 Units by mouth daily with lunch.     . dexamethasone (DECADRON) 4 MG tablet Take 2 tablets (8 mg total) by mouth daily. Start the day after chemotherapy for 1 day. 30 tablet 1  . dextromethorphan-guaiFENesin (MUCINEX  DM) 30-600 MG 12hr tablet Take 1 tablet by mouth 2 (two) times daily as needed (congestion/cough).     Marland Kitchen ELIQUIS 5 MG TABS tablet TAKE 1 TABLET BY MOUTH TWICE DAILY (Patient taking differently: Take 5 mg by mouth 2 (two) times daily. ) 60 tablet 6  . famotidine (PEPCID) 20 MG tablet Take 20 mg by mouth 2 (two) times daily.    . fenofibrate (TRICOR) 145 MG tablet Take 1 tablet (145 mg total) by mouth daily. 90 tablet 1  . folic acid (FOLVITE) 1 MG tablet Take 1 mg by mouth daily.    Marland Kitchen gabapentin (NEURONTIN) 100 MG capsule Take 100 mg by mouth 3 (three) times daily.    Marland Kitchen lidocaine-prilocaine (EMLA) cream Apply to affected area once 30 g 3  . magic mouthwash SOLN Take 5 mLs by mouth 4 (four) times daily.    . methotrexate 2.5 MG tablet Take 12.5 mg by mouth every Sunday.     . ondansetron (ZOFRAN) 8 MG tablet Take 1 tablet (8 mg total) by  mouth 2 (two) times daily as needed for refractory nausea / vomiting. Start on day 3 after carboplatin chemo. 30 tablet 1  . prochlorperazine (COMPAZINE) 10 MG tablet Take 1 tablet (10 mg total) by mouth every 6 (six) hours as needed (Nausea or vomiting). 30 tablet 1  . sodium chloride (OCEAN) 0.65 % SOLN nasal spray Place 1 spray into both nostrils at bedtime as needed for congestion.     . traMADol (ULTRAM) 50 MG tablet Take 25 mg by mouth at bedtime as needed for moderate pain.      No current facility-administered medications for this visit.    VITAL SIGNS: There were no vitals taken for this visit. There were no vitals filed for this visit.  Estimated body mass index is 27.65 kg/m as calculated from the following:   Height as of 04/17/19: 5' 6"  (1.676 m).   Weight as of an earlier encounter on 04/23/19: 171 lb 4.8 oz (77.7 kg).  LABS: CBC:    Component Value Date/Time   WBC 4.1 04/23/2019 0848   HGB 13.4 04/23/2019 0848   HGB 15.3 09/11/2017 1119   HCT 40.7 04/23/2019 0848   HCT 44.2 09/11/2017 1119   PLT 148 (L) 04/23/2019 0848   PLT 201  09/11/2017 1119   MCV 105.4 (H) 04/23/2019 0848   MCV 101 (H) 09/11/2017 1119   NEUTROABS 2.8 04/23/2019 0848   NEUTROABS 3.4 09/11/2017 1119   LYMPHSABS 0.8 04/23/2019 0848   LYMPHSABS 1.7 09/11/2017 1119   MONOABS 0.4 04/23/2019 0848   EOSABS 0.1 04/23/2019 0848   EOSABS 0.1 09/11/2017 1119   BASOSABS 0.0 04/23/2019 0848   BASOSABS 0.1 09/11/2017 1119   Comprehensive Metabolic Panel:    Component Value Date/Time   NA 134 (L) 04/23/2019 0848   NA 136 09/11/2017 1119   K 4.2 04/23/2019 0848   CL 103 04/23/2019 0848   CO2 24 04/23/2019 0848   BUN 18 04/23/2019 0848   BUN 19 09/11/2017 1119   CREATININE 1.19 04/23/2019 0848   GLUCOSE 116 (H) 04/23/2019 0848   CALCIUM 9.2 04/23/2019 0848   AST 50 (H) 04/23/2019 0848   ALT 28 04/23/2019 0848   ALKPHOS 68 04/23/2019 0848   BILITOT 0.9 04/23/2019 0848   PROT 6.8 04/23/2019 0848   ALBUMIN 3.8 04/23/2019 0848    RADIOGRAPHIC STUDIES: CT Head W Wo Contrast  Result Date: 04/04/2019 CLINICAL DATA:  Lung mass staging.  No CNS symptoms per report EXAM: CT HEAD WITHOUT AND WITH CONTRAST TECHNIQUE: Contiguous axial images were obtained from the base of the skull through the vertex without and with intravenous contrast CONTRAST:  35m OMNIPAQUE IOHEXOL 300 MG/ML  SOLN COMPARISON:  None. FINDINGS: Brain: No evidence of acute infarction, hemorrhage, hydrocephalus, extra-axial collection or mass lesion/mass effect. No abnormal intracranial enhancement Vascular: Major vessels are enhancing Skull: Negative for fracture or bone lesion Sinuses/Orbits: Negative IMPRESSION: Negative.  No evidence of metastatic disease. Electronically Signed   By: JMonte FantasiaM.D.   On: 04/04/2019 10:06   PERIPHERAL VASCULAR CATHETERIZATION  Result Date: 04/17/2019 See op note  CUP PACEART REMOTE DEVICE CHECK  Result Date: 04/18/2019 Scheduled remote reviewed.  Normal device function.  Next remote 91 days.   PERFORMANCE STATUS (ECOG) : 0 -  Asymptomatic  Review of Systems Unless otherwise noted, a complete review of systems is negative.  Physical Exam General: NAD Pulmonary: Unlabored Extremities: no edema, no joint deformities Skin: no rashes Neurological: nonfocal  IMPRESSION: I met with patient infusion area.  I introduced palliative care services and attempted to establish therapeutic rapport.  Patient reports that overall he is doing well.  He denies any significant changes or concerns.  No distressing symptoms are reported today.  Patient does endorse chronic exertional dyspnea for which he is followed by Dr. Patsey Berthold with pulmonary.  Patient says that his inhalers were recently adjusted and his dyspnea has been better controlled.  Patient denies pain.  He reports good oral intake.  No changes in performance status.  Patient would benefit from conversation regarding advanced care planning, clarification of goals, and establishing a MOST form.  We will plan to discuss when patient is seen in the clinic.   PLAN: -Continue current scope of treatment -Plan to discuss ACP/MOST form -Follow-up telephone visit 2 to 3 weeks   Patient expressed understanding and was in agreement with this plan. He also understands that He can call the clinic at any time with any questions, concerns, or complaints.     Time Total: 30 minutes  Visit consisted of counseling and education dealing with the complex and emotionally intense issues of symptom management and palliative care in the setting of serious and potentially life-threatening illness.Greater than 50%  of this time was spent counseling and coordinating care related to the above assessment and plan.  Signed by: Altha Harm, PhD, NP-C

## 2019-04-24 ENCOUNTER — Ambulatory Visit: Payer: Medicare PPO

## 2019-04-24 ENCOUNTER — Other Ambulatory Visit: Payer: Medicare PPO

## 2019-04-24 ENCOUNTER — Ambulatory Visit
Admission: RE | Admit: 2019-04-24 | Discharge: 2019-04-24 | Disposition: A | Discharge status: Home / Self Care | Payer: Medicare PPO | Source: Ambulatory Visit | Attending: Radiation Oncology | Admitting: Radiation Oncology

## 2019-04-24 ENCOUNTER — Inpatient Hospital Stay: Payer: Medicare PPO

## 2019-04-24 ENCOUNTER — Ambulatory Visit: Payer: Medicare PPO | Admitting: Oncology

## 2019-04-24 ENCOUNTER — Telehealth: Payer: Self-pay

## 2019-04-24 ENCOUNTER — Other Ambulatory Visit: Payer: Self-pay

## 2019-04-24 VITALS — BP 131/74 | HR 90 | Temp 97.1°F | Resp 18

## 2019-04-24 DIAGNOSIS — R918 Other nonspecific abnormal finding of lung field: Secondary | ICD-10-CM | POA: Diagnosis not present

## 2019-04-24 DIAGNOSIS — C3431 Malignant neoplasm of lower lobe, right bronchus or lung: Secondary | ICD-10-CM | POA: Diagnosis present

## 2019-04-24 DIAGNOSIS — Z5111 Encounter for antineoplastic chemotherapy: Secondary | ICD-10-CM | POA: Diagnosis not present

## 2019-04-24 DIAGNOSIS — C3490 Malignant neoplasm of unspecified part of unspecified bronchus or lung: Secondary | ICD-10-CM

## 2019-04-24 MED ORDER — SODIUM CHLORIDE 0.9 % IV SOLN
100.0000 mg/m2 | Freq: Once | INTRAVENOUS | Status: AC
  Administered 2019-04-24: 190 mg via INTRAVENOUS
  Filled 2019-04-24: qty 9.5

## 2019-04-24 MED ORDER — HEPARIN SOD (PORK) LOCK FLUSH 100 UNIT/ML IV SOLN
500.0000 [IU] | Freq: Once | INTRAVENOUS | Status: AC | PRN
  Administered 2019-04-24: 500 [IU]
  Filled 2019-04-24: qty 5

## 2019-04-24 MED ORDER — HEPARIN SOD (PORK) LOCK FLUSH 100 UNIT/ML IV SOLN
INTRAVENOUS | Status: AC
  Filled 2019-04-24: qty 5

## 2019-04-24 MED ORDER — SODIUM CHLORIDE 0.9 % IV SOLN
Freq: Once | INTRAVENOUS | Status: AC
  Filled 2019-04-24: qty 250

## 2019-04-24 MED ORDER — SODIUM CHLORIDE 0.9 % IV SOLN
10.0000 mg | Freq: Once | INTRAVENOUS | Status: AC
  Administered 2019-04-24: 10 mg via INTRAVENOUS
  Filled 2019-04-24: qty 10

## 2019-04-24 NOTE — Progress Notes (Signed)
1000: Per Elmon Else in pharmacy okay to proceed with scheduled Etoposide treatment at this time.

## 2019-04-24 NOTE — Telephone Encounter (Signed)
Telephone call to pt for follow up after receiving first infusion yesterday.   No answer but left message on voice mail letting pt know I was calling to see how he was doing after his first infusion.   Encouraged pt to call for any questions or concerns.

## 2019-04-25 ENCOUNTER — Inpatient Hospital Stay: Payer: Medicare PPO

## 2019-04-25 ENCOUNTER — Ambulatory Visit: Payer: Medicare PPO

## 2019-04-25 ENCOUNTER — Telehealth: Payer: Self-pay | Admitting: *Deleted

## 2019-04-25 ENCOUNTER — Other Ambulatory Visit: Payer: Self-pay

## 2019-04-25 VITALS — BP 131/74 | HR 90 | Temp 97.0°F | Resp 20

## 2019-04-25 DIAGNOSIS — Z5111 Encounter for antineoplastic chemotherapy: Secondary | ICD-10-CM | POA: Diagnosis not present

## 2019-04-25 DIAGNOSIS — C3490 Malignant neoplasm of unspecified part of unspecified bronchus or lung: Secondary | ICD-10-CM

## 2019-04-25 MED ORDER — SODIUM CHLORIDE 0.9 % IV SOLN
Freq: Once | INTRAVENOUS | Status: AC
  Filled 2019-04-25: qty 250

## 2019-04-25 MED ORDER — SODIUM CHLORIDE 0.9 % IV SOLN
100.0000 mg/m2 | Freq: Once | INTRAVENOUS | Status: AC
  Administered 2019-04-25: 190 mg via INTRAVENOUS
  Filled 2019-04-25: qty 9.5

## 2019-04-25 MED ORDER — CHLORPROMAZINE HCL 25 MG PO TABS: 25.0000 mg | ORAL_TABLET | Freq: Three times a day (TID) | ORAL | 0 refills | Status: DC

## 2019-04-25 MED ORDER — HEPARIN SOD (PORK) LOCK FLUSH 100 UNIT/ML IV SOLN
500.0000 [IU] | Freq: Once | INTRAVENOUS | Status: AC | PRN
  Administered 2019-04-25: 500 [IU]
  Filled 2019-04-25: qty 5

## 2019-04-25 MED ORDER — SODIUM CHLORIDE 0.9 % IV SOLN
10.0000 mg | Freq: Once | INTRAVENOUS | Status: AC
  Administered 2019-04-25: 10 mg via INTRAVENOUS
  Filled 2019-04-25: qty 10

## 2019-04-25 NOTE — Progress Notes (Signed)
Hematology/Oncology follow up note Darryl White Telephone:([phone]) [phone] Fax:([phone]) [phone]   Patient Care Team: Pleas Koch, NP as PCP - General (Internal Medicine) Deboraha Sprang, MD as Consulting Physician (Cardiology) Marlowe Sax, MD as Referring Physician (Internal Medicine) Birder Robson, MD as Referring Physician (Ophthalmology) Oneta Rack, MD as Referring Physician (Dermatology) Salli Real, DDS as Referring Physician (Dentistry) Telford Nab, RN as Oncology Nurse Navigator Earlie Server, MD as Consulting Physician (Hematology and Oncology)  REFERRING PROVIDER: Pleas Koch, NP  CHIEF COMPLAINTS/REASON FOR VISIT:  Follow-up for management of lung cancer  HISTORY OF PRESENTING ILLNESS:   Darryl White is a  79 y.o.  male with PMH listed below was seen in consultation at the request of  Pleas Koch, NP  for evaluation of lung mass April 2020 patient was evaluated by radiation oncology Dr. Baruch Gouty for right lung nodules concerning for progressive malignancy.   Patient had progressively enlarging media right lower lobe which was previously treated in April 2020 empirically with SBRT.  Biopsy was not done due to poor lung function secondary to COPD/moderate fibrosis. Patient also had a right upper lobe nodule being closely watched, and was found to have progressively enlarged on 10/29/2018 CT chest. 02/27/2019 patient had another CT done which showed marked progression of precarinal lymph node and a continued progression of peripheral right upper lobe pulmonary nodule. 03/20/2019 PET scan showed hypermetabolic right lower paratracheal and subcarinal adenopathy with hypermetabolic right upper lobe subpleural nodule, or significantly worsened compared to prior CT scan. Patient also had 1.2 x 1 cm left upper lobe confluent groundglass densities with low metabolic activity. Patient was referred to establish care with oncology  for further evaluation and management.  Patient reports chronic shortness of breath with exertion.  He follows up with pulmonology and was recently seen by Dr. Patsey Berthold.  Denies any hemoptysis,.  He has a history of extensive smoking history, 67-pack-year, quit in 2019. He lives with wife at home.  # PET scan images were reviewed and discussed with patient. Also discussed with patient about pathology report Pre-carinal lymph node EBUS guided FNA showed malignancy, metastatic high-grade carcinoma. 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  There are patchy areas with focal weak staining for CD56-which is a neuroendocrine marker.  However many clusters of tumor cells are negative.  While the morphology alone would support a diagnosis of small cell carcinoma, there is insufficient immunohistochemistry evidence to support any specific tumor differentiation. I discussed the possibility of small cell lung cancer which is more aggressive in nature and is related with worse outcome. Regimen with concurrent radiation and chemotherapy with carboplatin and taxol would not be sufficient for small cell lung cancer treatment.We will treat the benefit of doubt using small cell lung cancer regimen, he agrees with the plan. I confirmed with Dr. 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  INTERVAL HISTORY Darryl White is a 79 y.o. male who has above history reviewed by me today presents for follow up visit for management of lung cancer-high-grade carcinoma Problems and complaints are listed below: Patient reports no new complaints today.  He has had antiemetics and EMLA cream.  Reports understanding instructions with no questions.   Review of Systems  Constitutional:  Negative for appetite change, chills, fatigue, fever and  unexpected weight change.  HENT:   Negative for hearing loss and voice change.   Eyes: Negative for eye problems and icterus.  Respiratory: Positive for cough and shortness of breath. Negative for chest tightness.   Cardiovascular: Negative for chest pain and leg swelling.  Gastrointestinal: Negative for abdominal distention and abdominal pain.  Endocrine: Negative for hot flashes.  Genitourinary: Negative for difficulty urinating, dysuria and frequency.   Musculoskeletal: Negative for arthralgias.  Skin: Negative for itching and rash.  Neurological: Negative for light-headedness and numbness.  Hematological: Negative for adenopathy. Does not bruise/bleed easily.  Psychiatric/Behavioral: Negative for confusion.    MEDICAL HISTORY:  Past Medical History:  Diagnosis Date  . Arthritis 2019   dx of osteoarthritis and rhemautoid arthritis  . Cancer ([place])    lung  . Cataract 2019   left eye  . Chicken pox   . Collagen vascular disease ([place])   . 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 COPD (chronic obstructive pulmonary disease) ([place])   . Diverticulitis   . GERD (gastroesophageal reflux disease)   . 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 History of hiatal hernia   . History of stomach ulcers   . Hyperlipidemia   . Lymphocytic colitis   . Orthostatic hypotension   . Osteoarthritis   . Pancreatitis   . Paroxysmal atrial fibrillation (HCC)   . Presence of permanent cardiac pacemaker   . 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 APPENDECTOMY    . BACK SURGERY     bone chip lasered  . CARDIAC CATHETERIZATION    . CATARACT EXTRACTION W/ INTRAOCULAR LENS IMPLANT Right   . COLON SURGERY  1990's   12" removed of large intestine  . EYE SURGERY    . INSERT / REPLACE / [employer]  . large intestine block    . PACEMAKER  INSERTION     MDT dual chamber pacemaker  . PORTA CATH INSERTION N/A 04/17/2019   Procedure: PORTA CATH INSERTION;  Surgeon: Algernon Huxley, MD;  Location: [facility];  Service: Cardiovascular;  Laterality: N/A;  . PPM GENERATOR CHANGEOUT N/A 12/02/2018   Procedure: PPM GENERATOR CHANGEOUT;  Surgeon: Deboraha Sprang, MD;  Location: [facility];  Service: Cardiovascular;  Laterality: N/A;  . TONSILLECTOMY AND ADENOIDECTOMY    . VIDEO BRONCHOSCOPY WITH ENDOBRONCHIAL ULTRASOUND N/A 04/07/2019   Procedure: VIDEO BRONCHOSCOPY WITH ENDOBRONCHIAL ULTRASOUND;  Surgeon: Tyler Pita, MD;  Location: [facility];  Service: Pulmonary;  Laterality: N/A;    SOCIAL HISTORY: Social History   Socioeconomic History  . Marital status: Married    Spouse name: Not on file  . Number of children: 2  . Years of education: Not on file  . Highest education level: Not on file  Occupational History  . Occupation: Retired  Tobacco Use  . Smoking status: Former Smoker    Packs/day: 1.00    Years: 60.00    Pack years: 60.00    Types: Cigarettes    Quit date: 04/29/2017    Years since quitting: 1.9  . Smokeless tobacco: Never Used  Substance and Sexual Activity  . Alcohol use: Yes    Alcohol/week: 5.0 standard drinks    Types: 5 Glasses of wine per week  . Drug use: No  . Sexual activity: Never  Other Topics Concern  . Not on  file  Social History Narrative   Married.   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      Does not have a living will.   Desires CPR, does not want prolonged life support if futile.   Social Determinants of Health   Financial Resource Strain: Low Risk   . Difficulty of Paying Living Expenses: Not hard at all  Food Insecurity: No Food Insecurity  . Worried About Charity fundraiser in the Last Year: Never true  . Ran Out of Food in the Last Year: Never true  Transportation Needs: No Transportation Needs  . Lack of Transportation (Medical): No  .  Lack of Transportation (Non-Medical): No  Physical Activity: Sufficiently Active  . Days of Exercise per Week: 7 days  . Minutes of Exercise per Session: 60 min  Stress: No Stress Concern Present  . Feeling of Stress : Not at all  Social Connections:   . Frequency of Communication with Friends and Family: Not on file  . Frequency of Social Gatherings with Friends and Family: Not on file  . Attends Religious Services: Not on file  . Active Member of Clubs or Organizations: Not on file  . Attends Archivist Meetings: Not on file  . Marital Status: Not on file  Intimate Partner Violence: Not At Risk  . Fear of Current or Ex-Partner: No  . Emotionally Abused: No  . Physically Abused: No  . Sexually Abused: No    FAMILY HISTORY: Family History  Problem Relation Age of Onset  . Lung cancer Mother   . Heart disease Father   . Alzheimer's disease Father     ALLERGIES:  is allergic to sulfa antibiotics and penicillins.  MEDICATIONS:  Current Outpatient Medications  Medication Sig Dispense Refill  . albuterol (PROAIR HFA) 108 (90 Base) MCG/ACT inhaler Inhale 1-2 puffs into the lungs every 6 (six) hours as needed for wheezing or shortness of breath. 1 Inhaler 5  . antiseptic oral rinse (BIOTENE) LIQD 15 mLs by Mouth Rinse route as needed for dry mouth.    . brimonidine (ALPHAGAN) 0.15 % ophthalmic solution Place 1 drop into both eyes 2 (two) times daily.     . Budeson-Glycopyrrol-Formoterol (BREZTRI AEROSPHERE) 160-9-4.8 MCG/ACT AERO Inhale 2 puffs into the lungs 2 (two) times daily. 10.7 g 6  . chlorhexidine (PERIDEX) 0.12 % solution Use as directed 15 mLs in the mouth or throat in the morning and at bedtime. 473 mL 0  . Cholecalciferol (VITAMIN D3) 2000 UNITS capsule Take 2,000 Units by mouth daily with lunch.     . dexamethasone (DECADRON) 4 MG tablet Take 2 tablets (8 mg total) by mouth daily. Start the day after chemotherapy for 1 day. 30 tablet 1  .  dextromethorphan-guaiFENesin (MUCINEX DM) 30-600 MG 12hr tablet Take 1 tablet by mouth 2 (two) times daily as needed (congestion/cough).     Marland Kitchen ELIQUIS 5 MG TABS tablet TAKE 1 TABLET BY MOUTH TWICE DAILY (Patient taking differently: Take 5 mg by mouth 2 (two) times daily. ) 60 tablet 6  . famotidine (PEPCID) 20 MG tablet Take 20 mg by mouth 2 (two) times daily.    . fenofibrate (TRICOR) 145 MG tablet Take 1 tablet (145 mg total) by mouth daily. 90 tablet 1  . folic acid (FOLVITE) 1 MG tablet Take 1 mg by mouth daily.    Marland Kitchen gabapentin (NEURONTIN) 100 MG capsule Take 100 mg by mouth 3 (three) times daily.    Marland Kitchen lidocaine-prilocaine (EMLA) cream  Apply to affected area once 30 g 3  . methotrexate 2.5 MG tablet Take 12.5 mg by mouth every Sunday.     . ondansetron (ZOFRAN) 8 MG tablet Take 1 tablet (8 mg total) by mouth 2 (two) times daily as needed for refractory nausea / vomiting. Start on day 3 after carboplatin chemo. 30 tablet 1  . prochlorperazine (COMPAZINE) 10 MG tablet Take 1 tablet (10 mg total) by mouth every 6 (six) hours as needed (Nausea or vomiting). 30 tablet 1  . sodium chloride (OCEAN) 0.65 % SOLN nasal spray Place 1 spray into both nostrils at bedtime as needed for congestion.     . traMADol (ULTRAM) 50 MG tablet Take 25 mg by mouth at bedtime as needed for moderate pain.     . magic mouthwash SOLN Take 5 mLs by mouth 4 (four) times daily.     No current facility-administered medications for this visit.     PHYSICAL EXAMINATION: ECOG PERFORMANCE STATUS: 1 - Symptomatic but completely ambulatory Vitals:   04/23/19 0906  BP: 123/84  Pulse: 70  Resp: 18  Temp: 97.6 F (36.4 C)   Filed Weights   04/23/19 0906  Weight: 171 lb 4.8 oz (77.7 kg)    Physical Exam Constitutional:      General: He is not in acute distress.    Comments: Patient walks independently  HENT:     Head: Normocephalic and atraumatic.  Eyes:     General: No scleral icterus. Cardiovascular:     Rate  and Rhythm: Normal rate and regular rhythm.     Heart sounds: Normal heart sounds.  Pulmonary:     Effort: Pulmonary effort is normal. No respiratory distress.     Breath sounds: No wheezing.     Comments: Severely decreased breath sound bilaterally. Abdominal:     General: Bowel sounds are normal. There is no distension.     Palpations: Abdomen is soft.  Musculoskeletal:        General: No deformity. Normal range of motion.     Cervical back: Normal range of motion and neck supple.  Skin:    General: Skin is warm and dry.     Findings: No erythema or rash.  Neurological:     Mental Status: He is alert and oriented to person, place, and time. Mental status is at baseline.     Cranial Nerves: No cranial nerve deficit.     Coordination: Coordination normal.  Psychiatric:        Mood and Affect: Mood normal.     LABORATORY DATA:  I have reviewed the data as listed Lab Results  Component Value Date   WBC 4.1 04/23/2019   HGB 13.4 04/23/2019   HCT 40.7 04/23/2019   MCV 105.4 (H) 04/23/2019   PLT 148 (L) 04/23/2019   Recent Labs    11/28/18 1256 02/27/19 1023 03/13/19 0806 04/03/19 1109 04/23/19 0848  NA 134*  --  134* 133* 134*  K 4.4  --  4.6 4.8 4.2  CL 101  --  102 101 103  CO2 26  --  26 24 24   GLUCOSE 117*  --  105* 108* 116*  BUN 19  --  18 18 18   CREATININE 1.23   < > 1.18 1.24 1.19  CALCIUM 9.4  --  9.4 9.3 9.2  GFRNONAA 56*  --   --  55* 58*  GFRAA >60  --   --  >60 >60  PROT  --   --  6.9 7.0 6.8  ALBUMIN  --   --  4.2 4.0 3.8  AST  --   --  53* 53* 50*  ALT  --   --  33 31 28  ALKPHOS  --   --  71 60 68  BILITOT  --   --  0.9 1.2 0.9   < > = values in this interval not displayed.   Iron/TIBC/Ferritin/ %Sat No results found for: IRON, TIBC, FERRITIN, IRONPCTSAT    RADIOGRAPHIC STUDIES: I have personally reviewed the radiological images as listed and agreed with the findings in the report.  CT Head W Wo Contrast  Result Date: 04/04/2019 CLINICAL  DATA:  Lung mass staging.  No CNS symptoms per report EXAM: CT HEAD WITHOUT AND WITH CONTRAST TECHNIQUE: Contiguous axial images were obtained from the base of the skull through the vertex without and with intravenous contrast CONTRAST:  2mL OMNIPAQUE IOHEXOL 300 MG/ML  SOLN COMPARISON:  None. FINDINGS: Brain: No evidence of acute infarction, hemorrhage, hydrocephalus, extra-axial collection or mass lesion/mass effect. No abnormal intracranial enhancement Vascular: Major vessels are enhancing Skull: Negative for fracture or bone lesion Sinuses/Orbits: Negative IMPRESSION: Negative.  No evidence of metastatic disease. Electronically Signed   By: Monte Fantasia M.D.   On: 04/04/2019 10:06   PERIPHERAL VASCULAR CATHETERIZATION  Result Date: 04/17/2019 See op note  CUP PACEART REMOTE DEVICE CHECK  Result Date: 04/18/2019 Scheduled remote reviewed.  Normal device function.  Next remote 91 days.     ASSESSMENT & PLAN:  1. Chronic anticoagulation   2. Malignant neoplasm of right lung, unspecified part of lung (HCC)   3. Lung nodule   4. Encounter for antineoplastic chemotherapy   5. Rheumatoid arthritis, involving unspecified site, unspecified whether rheumatoid factor present ([place])    #Right lung high-grade poorly differentiated carcinoma.  Clinically T1c N2 M0. CT head is negative. MRI is contraindicated due to pace marker.  Labs are reviewed and discussed with patient today.  Counts acceptable to proceed with cycle 1 carboplatin/etoposide. Patient would not get growth factor support due to being on radiation. Chemotherapy education was provided.  We had discussed the composition of chemotherapy regimen, length of chemo cycle, duration of treatment and the time to assess response to treatment.   I explained to the patient the risks and benefits of chemotherapy including all but not limited to hair loss, mouth sore, nausea, vomiting, diarrhea, low blood counts, bleeding, neuropathy and risk of  life threatening infection and even death, secondary malignancy etc. Patient voices understanding and willing to proceed chemotherapy.  Supportive care measures are necessary for patient well-being and will be provided as necessary. We spent sufficient time to discuss many aspect of care, questions were answered to patient's satisfaction.  Left lung nodule as much lower-grade FDG activity on the PET scan, possible malignancy, a secondary primary vs metastatic lesion from right lung cancer.  Attention on follow-up.   #Rheumatoid arthritis, patient has been on methotrexate.  Advised patient to stop methotrexate when he is on radiation and chemotherapy.  He may resume once he finish current treatment.  I would like to discuss with Dr. Meda Coffee in the future if he will be a candidate for immunotherapy maintenance.  #Macrocytosis, secondary to methotrexate.  I recommend patient to continue use folic acid for now.   All questions were answered. The patient knows to call the clinic with any problems questions or concerns.  Return of visit:  1 week    Earlie Server, MD, PhD  Hematology [place] [facility] Pager- [phone] 04/25/2019

## 2019-04-25 NOTE — Telephone Encounter (Signed)
Pt called in to report having persistent hiccups after receiving chemo. Per Dr. Grayland Ormond, pt may start thorazine 25mg  TID to help with the hiccups. Message left with patient regarding new prescription. Nothing else needed at this time.

## 2019-04-25 NOTE — Addendum Note (Signed)
Addended by: Faythe Casa E on: 04/25/2019 12:44 PM   Modules accepted: Level of Service

## 2019-04-26 ENCOUNTER — Other Ambulatory Visit: Payer: Self-pay | Admitting: Oncology

## 2019-04-26 MED ORDER — FUROSEMIDE 20 MG PO TABS: 20.0000 mg | ORAL_TABLET | Freq: Every day | ORAL | 0 refills | Status: DC

## 2019-04-28 ENCOUNTER — Ambulatory Visit
Admission: RE | Admit: 2019-04-28 | Discharge: 2019-04-28 | Disposition: A | Discharge status: Home / Self Care | Payer: Medicare PPO | Source: Ambulatory Visit | Attending: Radiation Oncology | Admitting: Radiation Oncology

## 2019-04-28 ENCOUNTER — Other Ambulatory Visit: Payer: Self-pay

## 2019-04-28 DIAGNOSIS — C3431 Malignant neoplasm of lower lobe, right bronchus or lung: Secondary | ICD-10-CM | POA: Diagnosis not present

## 2019-04-29 ENCOUNTER — Telehealth: Payer: Self-pay | Admitting: *Deleted

## 2019-04-29 ENCOUNTER — Inpatient Hospital Stay: Payer: Medicare PPO

## 2019-04-29 ENCOUNTER — Ambulatory Visit
Admission: RE | Admit: 2019-04-29 | Discharge: 2019-04-29 | Disposition: A | Discharge status: Home / Self Care | Payer: Medicare PPO | Source: Ambulatory Visit | Attending: Radiation Oncology | Admitting: Radiation Oncology

## 2019-04-29 ENCOUNTER — Other Ambulatory Visit: Payer: Self-pay

## 2019-04-29 ENCOUNTER — Ambulatory Visit: Payer: Medicare PPO | Admitting: Pulmonary Disease

## 2019-04-29 ENCOUNTER — Telehealth: Payer: Self-pay | Admitting: Internal Medicine

## 2019-04-29 DIAGNOSIS — Z5111 Encounter for antineoplastic chemotherapy: Secondary | ICD-10-CM | POA: Diagnosis not present

## 2019-04-29 DIAGNOSIS — C3490 Malignant neoplasm of unspecified part of unspecified bronchus or lung: Secondary | ICD-10-CM

## 2019-04-29 DIAGNOSIS — C3431 Malignant neoplasm of lower lobe, right bronchus or lung: Secondary | ICD-10-CM | POA: Diagnosis not present

## 2019-04-29 LAB — CBC WITH DIFFERENTIAL/PLATELET
Abs Immature Granulocytes: 0 10*3/uL (ref 0.00–0.07)
Basophils Absolute: 0 10*3/uL (ref 0.0–0.1)
Basophils Relative: 1 %
Eosinophils Absolute: 0 10*3/uL (ref 0.0–0.5)
Eosinophils Relative: 1 %
HCT: 36.4 % — ABNORMAL LOW (ref 39.0–52.0)
Hemoglobin: 11.9 g/dL — ABNORMAL LOW (ref 13.0–17.0)
Lymphocytes Relative: 16 %
Lymphs Abs: 0.7 10*3/uL (ref 0.7–4.0)
MCH: 34.8 pg — ABNORMAL HIGH (ref 26.0–34.0)
MCHC: 32.7 g/dL (ref 30.0–36.0)
MCV: 106.4 fL — ABNORMAL HIGH (ref 80.0–100.0)
Monocytes Absolute: 0 10*3/uL — ABNORMAL LOW (ref 0.1–1.0)
Monocytes Relative: 1 %
Neutro Abs: 3.7 10*3/uL (ref 1.7–7.7)
Neutrophils Relative %: 81 %
Platelets: 110 10*3/uL — ABNORMAL LOW (ref 150–400)
RBC: 3.42 MIL/uL — ABNORMAL LOW (ref 4.22–5.81)
RDW: 14.1 % (ref 11.5–15.5)
Smear Review: NORMAL
WBC: 4.6 10*3/uL (ref 4.0–10.5)
nRBC: 0 % (ref 0.0–0.2)

## 2019-04-29 LAB — COMPREHENSIVE METABOLIC PANEL
ALT: 21 U/L (ref 0–44)
AST: 30 U/L (ref 15–41)
Albumin: 3.7 g/dL (ref 3.5–5.0)
Alkaline Phosphatase: 58 U/L (ref 38–126)
Anion gap: 7 (ref 5–15)
BUN: 30 mg/dL — ABNORMAL HIGH (ref 8–23)
CO2: 25 mmol/L (ref 22–32)
Calcium: 9.2 mg/dL (ref 8.9–10.3)
Chloride: 99 mmol/L (ref 98–111)
Creatinine, Ser: 1 mg/dL (ref 0.61–1.24)
GFR calc Af Amer: >60 mL/min (ref >60)
GFR calc non Af Amer: >60 mL/min (ref >60)
Glucose, Bld: 97 mg/dL (ref 70–99)
Potassium: 4.6 mmol/L (ref 3.5–5.1)
Sodium: 131 mmol/L — ABNORMAL LOW (ref 135–145)
Total Bilirubin: 1.1 mg/dL (ref 0.3–1.2)
Total Protein: 6.4 g/dL — ABNORMAL LOW (ref 6.5–8.1)

## 2019-04-29 NOTE — Telephone Encounter (Signed)
I called and spoke with the patient regarding Dr. [name] recommendations to hold eliquis for now in light of recent nose bleeds and drop in Hgb.   The patient clarified he will be taking radiation for 36 days straight except for weekends.  He will take Chemo for 3 days, then have 3 weeks off.   The patient is agreeable with holding eliquis. I have advised him to continue to follow up with oncology about when they feel he may resume his eliquis.  I have asked that he call us back if/ when he resumes this.   The patient voices understanding and is agreeable.

## 2019-04-29 NOTE — Telephone Encounter (Signed)
Agree that is reasonable to hold anticoagulation for now   risks are related to age without prior stroke and with significant epistaxis, may be best to hold for now Thanks SK

## 2019-04-29 NOTE — Telephone Encounter (Signed)
I spoke with the patient's wife. She states that patient started chemo last week and took this for 3 days, he is also taking this in conjunction with radiation x 36 weeks.  The patient started having nosebleeds ~ 2 days ago, just noticing spots of blood on a tissue. This morning, he was bleeding quite a bit on his shirt and on to the floor.   Oncology has suggested he stop his eliquis.  The patient is currently taking this for a-fib.   Labs reviewed in Epic. He had a CBC done: 04/23/19: Hgb- 13.4 Today: Hgb-11.9  I have advised the patient's wife I will review this information with [name] and call her back with further recommendations.   She voices understanding and is agreeable.

## 2019-04-29 NOTE — Telephone Encounter (Signed)
Pt called in to report that had a significant nosebleed this morning. States that it bleed for 3-35 minutes before he was able to get it to stop. Last dose of eliquis was last night and he has not taken it this morning. Per Dr. Tasia Catchings, advised pt to hold eliquis at this time and to come get labs checked at 10:45am prior to radiation this morning. Advised to contact cardiologist to inform him as well. Pt verbalized understanding.

## 2019-04-29 NOTE — Telephone Encounter (Signed)
Patient states the last 2 days he has had nose bleeds and his oncologist suggested he stop his Eliquis. Please call to discuss.

## 2019-04-30 ENCOUNTER — Ambulatory Visit
Admission: RE | Admit: 2019-04-30 | Discharge: 2019-04-30 | Disposition: A | Discharge status: Home / Self Care | Payer: Medicare PPO | Source: Ambulatory Visit | Attending: Radiation Oncology | Admitting: Radiation Oncology

## 2019-04-30 ENCOUNTER — Inpatient Hospital Stay (HOSPITAL_BASED_OUTPATIENT_CLINIC_OR_DEPARTMENT_OTHER): Payer: Medicare PPO | Admitting: Oncology

## 2019-04-30 ENCOUNTER — Encounter: Payer: Self-pay | Admitting: Oncology

## 2019-04-30 ENCOUNTER — Inpatient Hospital Stay: Payer: Medicare PPO

## 2019-04-30 ENCOUNTER — Encounter: Payer: Self-pay | Admitting: *Deleted

## 2019-04-30 VITALS — BP 127/80 | HR 65 | Temp 97.0°F | Resp 18 | Wt 169.4 lb

## 2019-04-30 DIAGNOSIS — C3491 Malignant neoplasm of unspecified part of right bronchus or lung: Secondary | ICD-10-CM

## 2019-04-30 DIAGNOSIS — Z7901 Long term (current) use of anticoagulants: Secondary | ICD-10-CM

## 2019-04-30 DIAGNOSIS — M069 Rheumatoid arthritis, unspecified: Secondary | ICD-10-CM | POA: Diagnosis not present

## 2019-04-30 DIAGNOSIS — C3431 Malignant neoplasm of lower lobe, right bronchus or lung: Secondary | ICD-10-CM | POA: Diagnosis not present

## 2019-04-30 DIAGNOSIS — D696 Thrombocytopenia, unspecified: Secondary | ICD-10-CM | POA: Diagnosis not present

## 2019-04-30 DIAGNOSIS — Z5111 Encounter for antineoplastic chemotherapy: Secondary | ICD-10-CM | POA: Diagnosis not present

## 2019-04-30 DIAGNOSIS — C3490 Malignant neoplasm of unspecified part of unspecified bronchus or lung: Secondary | ICD-10-CM

## 2019-04-30 DIAGNOSIS — E871 Hypo-osmolality and hyponatremia: Secondary | ICD-10-CM

## 2019-04-30 MED ORDER — SODIUM CHLORIDE 0.9 % IV SOLN
INTRAVENOUS | Status: DC
  Filled 2019-04-30 (×2): qty 250

## 2019-04-30 MED ORDER — HEPARIN SOD (PORK) LOCK FLUSH 100 UNIT/ML IV SOLN
500.0000 [IU] | Freq: Once | INTRAVENOUS | Status: AC
  Administered 2019-04-30: 500 [IU] via INTRAVENOUS
  Filled 2019-04-30: qty 5

## 2019-04-30 MED ORDER — HEPARIN SOD (PORK) LOCK FLUSH 100 UNIT/ML IV SOLN
INTRAVENOUS | Status: AC
  Filled 2019-04-30: qty 5

## 2019-04-30 NOTE — Progress Notes (Signed)
Hematology/Oncology follow up note [facility] [phone] [phone]   Patient Care Team: Pleas Koch, NP as PCP - General (Internal Medicine) Deboraha Sprang, MD as Consulting Physician (Cardiology) Marlowe Sax, MD as Referring Physician (Internal Medicine) Birder Robson, MD as Referring Physician (Ophthalmology) Oneta Rack, MD as Referring Physician (Dermatology) Salli Real, DDS as Referring Physician (Dentistry) Telford Nab, RN as Oncology Nurse Navigator Earlie Server, MD as Consulting Physician (Hematology and Oncology)  REFERRING PROVIDER: Pleas Koch, NP  CHIEF COMPLAINTS/REASON FOR VISIT:  Follow-up for management of lung cancer  HISTORY OF PRESENTING ILLNESS:   Darryl White is a  79 y.o.  male with PMH listed below was seen in consultation at the request of  Pleas Koch, NP  for evaluation of lung mass April 2020 patient was evaluated by radiation oncology Dr. Baruch Gouty for right lung nodules concerning for progressive malignancy.   Patient had progressively enlarging media right lower lobe which was previously treated in April 2020 empirically with SBRT.  Biopsy was not done due to poor lung function secondary to COPD/moderate fibrosis. Patient also had a right upper lobe nodule being closely watched, and was found to have progressively enlarged on 10/29/2018 CT chest. 02/27/2019 patient had another CT done which showed marked progression of precarinal lymph node and a continued progression of peripheral right upper lobe pulmonary nodule. 03/20/2019 PET scan showed hypermetabolic right lower paratracheal and subcarinal adenopathy with hypermetabolic right upper lobe subpleural nodule, or significantly worsened compared to prior CT scan. Patient also had 1.2 x 1 cm left upper lobe confluent groundglass densities with low metabolic activity. Patient was referred to establish care with oncology  for further evaluation and management.  Patient reports chronic shortness of breath with exertion.  He follows up with pulmonology and was recently seen by Dr. Patsey Berthold.  Denies any hemoptysis,.  He has a history of extensive smoking history, 67-pack-year, quit in 2019. He lives with wife at home. -CT head is negative. MRI is contraindicated due to pace marker.   # PET scan images were reviewed and discussed with patient. Also discussed with patient about pathology report Pre-carinal lymph node EBUS guided FNA showed malignancy, metastatic high-grade carcinoma. 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  There are patchy areas with focal weak staining for CD56-which is a neuroendocrine marker.  However many clusters of tumor cells are negative.  While the morphology alone would support a diagnosis of small cell carcinoma, there is insufficient immunohistochemistry evidence to support any specific tumor differentiation. I discussed the possibility of small cell lung cancer which is more aggressive in nature and is related with worse outcome. Regimen with concurrent radiation and chemotherapy with carboplatin and taxol would not be sufficient for small cell lung cancer treatment.We will treat the benefit of doubt using small cell lung cancer regimen, he agrees with the plan. I confirmed with Dr. 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  INTERVAL HISTORY Darryl White is a 79 y.o. male who has above history reviewed by me today presents for follow up visit for management of lung cancer-high-grade carcinoma Problems and complaints are listed below: Patient developed multiple epistaxis episodes during the interval.  Some episodes takes about 20 to 30 minutes  to stop.  Patient called RN navigator  and I advised patient to stop Eliquis.  Patient has been on Eliquis 5 mg twice daily due to cardiology diseases.  Patient has contacted cardiology and [name] recommend patient to hold off Eliquis for now.  Otherwise patient reports feeling well.  Slightly more fatigued compared to his baseline.  Appetite is not as good as previously but fair.  He denies nausea, vomiting, diarrhea, skin rash.   Review of Systems  Constitutional: Positive for fatigue. Negative for appetite change, chills, fever and unexpected weight change.  HENT:   Positive for nosebleeds. Negative for hearing loss and voice change.   Eyes: Negative for eye problems and icterus.  Respiratory: Positive for shortness of breath. Negative for chest tightness and cough.   Cardiovascular: Negative for chest pain and leg swelling.  Gastrointestinal: Negative for abdominal distention and abdominal pain.  Endocrine: Negative for hot flashes.  Genitourinary: Negative for difficulty urinating, dysuria and frequency.   Musculoskeletal: Negative for arthralgias.  Skin: Negative for itching and rash.  Neurological: Negative for light-headedness and numbness.  Hematological: Negative for adenopathy. Does not bruise/bleed easily.  Psychiatric/Behavioral: Negative for confusion.    MEDICAL HISTORY:  Past Medical History:  Diagnosis Date  . Arthritis 2019   dx of osteoarthritis and rhemautoid arthritis  . Cancer ([place])    lung  . Cataract 2019   left eye  . Chicken pox   . Collagen vascular disease ([place])   . 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 COPD (chronic obstructive pulmonary disease) ([place])   . Diverticulitis   . GERD (gastroesophageal reflux disease)   . 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 History of hiatal hernia   . History of stomach ulcers   . Hyperlipidemia   . Lymphocytic colitis   . Orthostatic hypotension   . Osteoarthritis   . Pancreatitis   .  Paroxysmal atrial fibrillation (HCC)   . Presence of permanent cardiac pacemaker   . 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 APPENDECTOMY    . BACK SURGERY     bone chip lasered  . CARDIAC CATHETERIZATION    . CATARACT EXTRACTION W/ INTRAOCULAR LENS IMPLANT Right   . COLON SURGERY  1990's   12" removed of large intestine  . EYE SURGERY    . INSERT / REPLACE / [place]  . large intestine block    . PACEMAKER INSERTION     MDT dual chamber pacemaker  . PORTA CATH INSERTION N/A 04/17/2019   Procedure: PORTA CATH INSERTION;  Surgeon: Algernon Huxley, MD;  Location: [place] [facility] [place] [facility];  Service: Cardiovascular;  Laterality: N/A;  . PPM GENERATOR CHANGEOUT N/A 12/02/2018   Procedure: PPM GENERATOR CHANGEOUT;  Surgeon: Deboraha Sprang, MD;  Location: [place] [facility];  Service: Cardiovascular;  Laterality: N/A;  . TONSILLECTOMY AND ADENOIDECTOMY    . VIDEO BRONCHOSCOPY WITH ENDOBRONCHIAL ULTRASOUND N/A 04/07/2019   Procedure: VIDEO BRONCHOSCOPY WITH ENDOBRONCHIAL ULTRASOUND;  Surgeon: Tyler Pita, MD;  Location: [facility];  Service: Pulmonary;  Laterality: N/A;    SOCIAL HISTORY: Social History   Socioeconomic History  . Marital status: Married    Spouse name: Not on file  . Number of children: 2  . Years of education: Not on file  . Highest education level: Not on file  Occupational History  . Occupation: Retired  Tobacco Use  .  Smoking status: Former Smoker    Packs/day: 1.00    Years: 60.00    Pack years: 60.00    Types: Cigarettes    Quit date: 04/29/2017    Years since quitting: 2.0  . Smokeless tobacco: Never Used  Substance and Sexual Activity  . Alcohol use: Yes    Alcohol/week: 5.0 standard drinks    Types: 5 Glasses of wine per week  . Drug use: No  . Sexual activity: Never  Other Topics Concern  . Not on file  Social History Narrative   Married.   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      Does not have a living will.   Desires CPR, does not want prolonged life support if futile.   Social Determinants of Health   Financial Resource Strain: Low Risk   . Difficulty of Paying Living Expenses: Not hard at all  Food Insecurity: No Food Insecurity  . Worried About Charity fundraiser in the Last Year: Never true  . Ran Out of Food in the Last Year: Never true  Transportation Needs: No Transportation Needs  . Lack of Transportation (Medical): No  . Lack of Transportation (Non-Medical): No  Physical Activity: Sufficiently Active  . Days of Exercise per Week: 7 days  . Minutes of Exercise per Session: 60 min  Stress: No Stress Concern Present  . Feeling of Stress : Not at all  Social Connections:   . Frequency of Communication with Friends and Family: Not on file  . Frequency of Social Gatherings with Friends and Family: Not on file  . Attends Religious Services: Not on file  . Active Member of Clubs or Organizations: Not on file  . Attends Archivist Meetings: Not on file  . Marital Status: Not on file  Intimate Partner Violence: Not At Risk  . Fear of Current or Ex-Partner: No  . Emotionally Abused: No  . Physically Abused: No  . Sexually Abused: No    FAMILY HISTORY: Family History  Problem Relation Age of Onset  . Lung cancer Mother   . Heart disease Father   . Alzheimer's disease Father     ALLERGIES:  is allergic to sulfa antibiotics and penicillins.  MEDICATIONS:  Current Outpatient Medications  Medication Sig Dispense Refill  . albuterol (PROAIR HFA) 108 (90 Base) MCG/ACT inhaler Inhale 1-2 puffs into the lungs every 6 (six) hours as needed for wheezing or shortness of breath. 1 Inhaler 5  . antiseptic oral rinse (BIOTENE) LIQD 15 mLs by Mouth Rinse route as needed for dry mouth.    . brimonidine (ALPHAGAN) 0.15 % ophthalmic solution Place 1 drop into both eyes 2 (two) times daily.     .  Budeson-Glycopyrrol-Formoterol (BREZTRI AEROSPHERE) 160-9-4.8 MCG/ACT AERO Inhale 2 puffs into the lungs 2 (two) times daily. 10.7 g 6  . chlorhexidine (PERIDEX) 0.12 % solution Use as directed 15 mLs in the mouth or throat in the morning and at bedtime. 473 mL 0  . Cholecalciferol (VITAMIN D3) 2000 UNITS capsule Take 2,000 Units by mouth daily with lunch.     . dexamethasone (DECADRON) 4 MG tablet Take 2 tablets (8 mg total) by mouth daily. Start the day after chemotherapy for 1 day. 30 tablet 1  . dextromethorphan-guaiFENesin (MUCINEX DM) 30-600 MG 12hr tablet Take 1 tablet by mouth 2 (two) times daily as needed (congestion/cough).     . famotidine (PEPCID) 20 MG tablet Take 20 mg by mouth 2 (two) times daily.    Marland Kitchen  fenofibrate (TRICOR) 145 MG tablet Take 1 tablet (145 mg total) by mouth daily. 90 tablet 1  . folic acid (FOLVITE) 1 MG tablet Take 1 mg by mouth daily.    . furosemide (LASIX) 20 MG tablet Take 1 tablet (20 mg total) by mouth daily. 10 tablet 0  . gabapentin (NEURONTIN) 100 MG capsule Take 100 mg by mouth 3 (three) times daily.    Marland Kitchen lidocaine-prilocaine (EMLA) cream Apply to affected area once 30 g 3  . magic mouthwash SOLN Take 5 mLs by mouth 4 (four) times daily.    . ondansetron (ZOFRAN) 8 MG tablet Take 1 tablet (8 mg total) by mouth 2 (two) times daily as needed for refractory nausea / vomiting. Start on day 3 after carboplatin chemo. 30 tablet 1  . prochlorperazine (COMPAZINE) 10 MG tablet Take 1 tablet (10 mg total) by mouth every 6 (six) hours as needed (Nausea or vomiting). 30 tablet 1  . sodium chloride (OCEAN) 0.65 % SOLN nasal spray Place 1 spray into both nostrils at bedtime as needed for congestion.     . traMADol (ULTRAM) 50 MG tablet Take 25 mg by mouth at bedtime as needed for moderate pain.     . chlorproMAZINE (THORAZINE) 25 MG tablet Take 1 tablet (25 mg total) by mouth 3 (three) times daily. (Patient not taking: Reported on 04/30/2019) 30 tablet 0  . ELIQUIS 5 MG  TABS tablet TAKE 1 TABLET BY MOUTH TWICE DAILY (Patient not taking: On hold per cardiologist due to nose bleeds.) 60 tablet 6  . methotrexate 2.5 MG tablet Take 12.5 mg by mouth every Sunday.      No current facility-administered medications for this visit.     PHYSICAL EXAMINATION: ECOG PERFORMANCE STATUS: 1 - Symptomatic but completely ambulatory Vitals:   04/30/19 1056  BP: 127/80  Pulse: 65  Resp: 18  Temp: (!) 97 F (36.1 C)   Filed Weights   04/30/19 1056  Weight: 169 lb 6.4 oz (76.8 kg)    Physical Exam Constitutional:      General: He is not in acute distress.    Comments: Patient walks independently  HENT:     Head: Normocephalic and atraumatic.  Eyes:     General: No scleral icterus. Cardiovascular:     Rate and Rhythm: Normal rate and regular rhythm.     Heart sounds: Normal heart sounds.  Pulmonary:     Effort: Pulmonary effort is normal. No respiratory distress.     Breath sounds: No wheezing.     Comments: Severely decreased breath sound bilaterally. Abdominal:     General: Bowel sounds are normal. There is no distension.     Palpations: Abdomen is soft.  Musculoskeletal:        General: No deformity. Normal range of motion.     Cervical back: Normal range of motion and neck supple.  Skin:    General: Skin is warm and dry.     Findings: No erythema or rash.  Neurological:     Mental Status: He is alert and oriented to person, place, and time. Mental status is at baseline.     Cranial Nerves: No cranial nerve deficit.     Coordination: Coordination normal.  Psychiatric:        Mood and Affect: Mood normal.     LABORATORY DATA:  I have reviewed the data as listed Lab Results  Component Value Date   WBC 4.6 04/29/2019   HGB 11.9 (L) 04/29/2019  HCT 36.4 (L) 04/29/2019   MCV 106.4 (H) 04/29/2019   PLT 110 (L) 04/29/2019   Recent Labs    04/03/19 1109 04/23/19 0848 04/29/19 1051  NA 133* 134* 131*  K 4.8 4.2 4.6  CL 101 103 99  CO2 24  24 25   GLUCOSE 108* 116* 97  BUN 18 18 30*  CREATININE 1.24 1.19 1.00  CALCIUM 9.3 9.2 9.2  GFRNONAA 55* 58* >60  GFRAA >60 >60 >60  PROT 7.0 6.8 6.4*  ALBUMIN 4.0 3.8 3.7  AST 53* 50* 30  ALT 31 28 21   ALKPHOS 60 68 58  BILITOT 1.2 0.9 1.1   Iron/TIBC/Ferritin/ %Sat No results found for: IRON, TIBC, FERRITIN, IRONPCTSAT    RADIOGRAPHIC STUDIES: I have personally reviewed the radiological images as listed and agreed with the findings in the report.  CT Head W Wo Contrast  Result Date: 04/04/2019 CLINICAL DATA:  Lung mass staging.  No CNS symptoms per report EXAM: CT HEAD WITHOUT AND WITH CONTRAST TECHNIQUE: Contiguous axial images were obtained from the base of the skull through the vertex without and with intravenous contrast CONTRAST:  35mL OMNIPAQUE IOHEXOL 300 MG/ML  SOLN COMPARISON:  None. FINDINGS: Brain: No evidence of acute infarction, hemorrhage, hydrocephalus, extra-axial collection or mass lesion/mass effect. No abnormal intracranial enhancement Vascular: Major vessels are enhancing Skull: Negative for fracture or bone lesion Sinuses/Orbits: Negative IMPRESSION: Negative.  No evidence of metastatic disease. Electronically Signed   By: Monte Fantasia M.D.   On: 04/04/2019 10:06   PERIPHERAL VASCULAR CATHETERIZATION  Result Date: 04/17/2019 See op note  CUP PACEART REMOTE DEVICE CHECK  Result Date: 04/18/2019 Scheduled remote reviewed.  Normal device function.  Next remote 91 days.     ASSESSMENT & PLAN:  1. Malignant neoplasm of right lung, unspecified part of lung ([place])   2. Rheumatoid arthritis, involving unspecified site, unspecified whether rheumatoid factor present ([place])   3. Chronic anticoagulation   4. Thrombocytopenia ([place])   5. 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  Clinically T1c N2 M0.  Currently on concurrent chemo and radiation Status post 1 cycle of carboplatin and  etoposide. Clinically he is doing well except epistaxis-see below Labs reviewed and discussed with patient. Continue monitor counts weekly.  #Thrombocytopenia, platelet count 110.  Chemotherapy-induced. #Epistaxis, likely combination of thrombocytopenia and chronic anticoagulation with Eliquis 5 mg twice daily. Cardiology agrees with holding Eliquis temporarily for now. No additional epistaxis episodes since the stop of Eliquis. Discussed with patient that since he is on concurrent chemoradiation, it is very likely that his platelet counts may continue to decrease.  Advised patient to update me if he experience additional epistaxis episodes.  #Chronic rheumatoid arthritis, patient has been off methotrexate.  I encourage patient to continue take folic acid for now.  Left lung nodule as much lower-grade FDG activity on the PET scan, possible malignancy, a secondary primary vs metastatic lesion from right lung cancer.  Attention on follow-up.   # hyponatremia, patient will receive IV NS 533ml x 1 today.    All questions were answered. The patient knows to call the clinic with any problems questions or concerns.  Return of visit: Lab in 1 week, follow-up lab MD in 2 weeks.    Earlie Server, MD, PhD Hematology Oncology [facility] Pager- [phone] 04/30/2019

## 2019-04-30 NOTE — Progress Notes (Signed)
Patient having frequent nose bleeds with a bad episode yesterday.  Cardiologist advised to stop Eliquis.

## 2019-04-30 NOTE — Progress Notes (Signed)
  Oncology Nurse Navigator Documentation  Navigator Location: [facility]Med Onc (04/30/19 1300)   )Navigator Encounter Type: Follow-up Appt (04/30/19 1300)                     Patient Visit Type: MedOnc;[place] Check (04/30/19 1300) Treatment Phase: Active Tx (04/30/19 1300) Barriers/Navigation Needs: No Barriers At This Time (04/30/19 1300)   Interventions: None Required (04/30/19 1300)               met with patient after follow up visit with Dr. Tasia Catchings. Pt tolerated first chemo treatment well. No barriers identified during visit. Instructed pt to call with any questions or needs. Will follow up at next treatment in 2 weeks. Nothing further needed at this time.        Time Spent with Patient: 30 (04/30/19 1300)

## 2019-05-01 ENCOUNTER — Ambulatory Visit
Admission: RE | Admit: 2019-05-01 | Discharge: 2019-05-01 | Disposition: A | Discharge status: Home / Self Care | Payer: Medicare PPO | Source: Ambulatory Visit | Attending: Radiation Oncology | Admitting: Radiation Oncology

## 2019-05-01 DIAGNOSIS — C3431 Malignant neoplasm of lower lobe, right bronchus or lung: Secondary | ICD-10-CM | POA: Diagnosis not present

## 2019-05-02 ENCOUNTER — Ambulatory Visit
Admission: RE | Admit: 2019-05-02 | Discharge: 2019-05-02 | Disposition: A | Discharge status: Home / Self Care | Payer: Medicare PPO | Source: Ambulatory Visit | Attending: Radiation Oncology | Admitting: Radiation Oncology

## 2019-05-02 DIAGNOSIS — C3431 Malignant neoplasm of lower lobe, right bronchus or lung: Secondary | ICD-10-CM | POA: Diagnosis not present

## 2019-05-05 ENCOUNTER — Ambulatory Visit
Admission: RE | Admit: 2019-05-05 | Discharge: 2019-05-05 | Disposition: A | Discharge status: Home / Self Care | Payer: Medicare PPO | Source: Ambulatory Visit | Attending: Radiation Oncology | Admitting: Radiation Oncology

## 2019-05-05 DIAGNOSIS — C3431 Malignant neoplasm of lower lobe, right bronchus or lung: Secondary | ICD-10-CM | POA: Diagnosis not present

## 2019-05-06 ENCOUNTER — Ambulatory Visit
Admission: RE | Admit: 2019-05-06 | Discharge: 2019-05-06 | Disposition: A | Discharge status: Home / Self Care | Payer: Medicare PPO | Source: Ambulatory Visit | Attending: Radiation Oncology | Admitting: Radiation Oncology

## 2019-05-06 ENCOUNTER — Telehealth: Payer: Self-pay | Admitting: *Deleted

## 2019-05-06 ENCOUNTER — Encounter: Payer: Self-pay | Admitting: Oncology

## 2019-05-06 DIAGNOSIS — C3431 Malignant neoplasm of lower lobe, right bronchus or lung: Secondary | ICD-10-CM | POA: Diagnosis not present

## 2019-05-06 MED ORDER — CHLORHEXIDINE GLUCONATE 0.12 % MT SOLN: 15.0000 mL | Freq: Two times a day (BID) | OROMUCOSAL | 1 refills | Status: DC

## 2019-05-06 MED ORDER — MAGIC MOUTHWASH W/LIDOCAINE: 5.0000 mL | Freq: Three times a day (TID) | ORAL | 1 refills | Status: DC | PRN

## 2019-05-06 NOTE — Telephone Encounter (Signed)
Pt called in asking for refill of peridex. States has been using it to help with mouth sores. States that his mouth sores are better after using peridex and salt water rinse. Per Dr. Tasia Catchings, pt can continue peridex BID and add in magic mouth wash w/lidocaine TID. Pt made aware and states that he has tried magic mouthwash previously which made his thrush worse. Pt advised to continue taking peridex BID with salt water rinse prn. Pt verbalized understanding.

## 2019-05-07 ENCOUNTER — Telehealth: Payer: Self-pay | Admitting: *Deleted

## 2019-05-07 ENCOUNTER — Inpatient Hospital Stay: Payer: Medicare PPO

## 2019-05-07 ENCOUNTER — Ambulatory Visit
Admission: RE | Admit: 2019-05-07 | Discharge: 2019-05-07 | Disposition: A | Discharge status: Home / Self Care | Payer: Medicare PPO | Source: Ambulatory Visit | Attending: Radiation Oncology | Admitting: Radiation Oncology

## 2019-05-07 ENCOUNTER — Other Ambulatory Visit: Payer: Self-pay | Admitting: *Deleted

## 2019-05-07 DIAGNOSIS — C3431 Malignant neoplasm of lower lobe, right bronchus or lung: Secondary | ICD-10-CM | POA: Diagnosis not present

## 2019-05-07 DIAGNOSIS — Z5111 Encounter for antineoplastic chemotherapy: Secondary | ICD-10-CM | POA: Diagnosis not present

## 2019-05-07 DIAGNOSIS — C3490 Malignant neoplasm of unspecified part of unspecified bronchus or lung: Secondary | ICD-10-CM

## 2019-05-07 DIAGNOSIS — D701 Agranulocytosis secondary to cancer chemotherapy: Secondary | ICD-10-CM

## 2019-05-07 LAB — CBC WITH DIFFERENTIAL/PLATELET
Abs Immature Granulocytes: 0 10*3/uL (ref 0.00–0.07)
Basophils Absolute: 0 10*3/uL (ref 0.0–0.1)
Basophils Relative: 1 %
Eosinophils Absolute: 0.1 10*3/uL (ref 0.0–0.5)
Eosinophils Relative: 8 %
HCT: 35.6 % — ABNORMAL LOW (ref 39.0–52.0)
Hemoglobin: 12.4 g/dL — ABNORMAL LOW (ref 13.0–17.0)
Immature Granulocytes: 0 %
Lymphocytes Relative: 59 %
Lymphs Abs: 0.5 10*3/uL — ABNORMAL LOW (ref 0.7–4.0)
MCH: 36.4 pg — ABNORMAL HIGH (ref 26.0–34.0)
MCHC: 34.8 g/dL (ref 30.0–36.0)
MCV: 104.4 fL — ABNORMAL HIGH (ref 80.0–100.0)
Monocytes Absolute: 0.2 10*3/uL (ref 0.1–1.0)
Monocytes Relative: 20 %
Neutro Abs: 0.1 10*3/uL — ABNORMAL LOW (ref 1.7–7.7)
Neutrophils Relative %: 12 %
Platelets: 79 10*3/uL — ABNORMAL LOW (ref 150–400)
RBC: 3.41 MIL/uL — ABNORMAL LOW (ref 4.22–5.81)
RDW: 14.3 % (ref 11.5–15.5)
WBC: 0.8 10*3/uL — CL (ref 4.0–10.5)
nRBC: 0 % (ref 0.0–0.2)

## 2019-05-07 LAB — COMPREHENSIVE METABOLIC PANEL
ALT: 21 U/L (ref 0–44)
AST: 32 U/L (ref 15–41)
Albumin: 3.9 g/dL (ref 3.5–5.0)
Alkaline Phosphatase: 74 U/L (ref 38–126)
Anion gap: 7 (ref 5–15)
BUN: 14 mg/dL (ref 8–23)
CO2: 25 mmol/L (ref 22–32)
Calcium: 9 mg/dL (ref 8.9–10.3)
Chloride: 102 mmol/L (ref 98–111)
Creatinine, Ser: 1.08 mg/dL (ref 0.61–1.24)
GFR calc Af Amer: >60 mL/min (ref >60)
GFR calc non Af Amer: >60 mL/min (ref >60)
Glucose, Bld: 102 mg/dL — ABNORMAL HIGH (ref 70–99)
Potassium: 4.1 mmol/L (ref 3.5–5.1)
Sodium: 134 mmol/L — ABNORMAL LOW (ref 135–145)
Total Bilirubin: 0.7 mg/dL (ref 0.3–1.2)
Total Protein: 6.9 g/dL (ref 6.5–8.1)

## 2019-05-07 MED ORDER — CIPROFLOXACIN HCL 500 MG PO TABS: 500.0000 mg | ORAL_TABLET | Freq: Every day | ORAL | 0 refills | Status: DC

## 2019-05-07 NOTE — Telephone Encounter (Signed)
Pt made aware of low blood counts from lab work today. Neutropenic precautions discussed. Pt made aware that will start cipro 500mg  daily for 7 days. Per Dr. Baruch Gouty - radiation does not need to be held at this time but will recheck labs Friday morning before radiation. Pt made aware. All questions answered during phone call. Pt verbalized understanding.

## 2019-05-08 ENCOUNTER — Ambulatory Visit
Admission: RE | Admit: 2019-05-08 | Discharge: 2019-05-08 | Disposition: A | Discharge status: Home / Self Care | Payer: Medicare PPO | Source: Ambulatory Visit | Attending: Radiation Oncology | Admitting: Radiation Oncology

## 2019-05-08 DIAGNOSIS — C3431 Malignant neoplasm of lower lobe, right bronchus or lung: Secondary | ICD-10-CM | POA: Diagnosis not present

## 2019-05-09 ENCOUNTER — Ambulatory Visit
Admission: RE | Admit: 2019-05-09 | Discharge: 2019-05-09 | Disposition: A | Discharge status: Home / Self Care | Payer: Medicare PPO | Source: Ambulatory Visit | Attending: Radiation Oncology | Admitting: Radiation Oncology

## 2019-05-09 ENCOUNTER — Inpatient Hospital Stay: Payer: Medicare PPO

## 2019-05-09 DIAGNOSIS — Z5111 Encounter for antineoplastic chemotherapy: Secondary | ICD-10-CM | POA: Diagnosis not present

## 2019-05-09 DIAGNOSIS — C3431 Malignant neoplasm of lower lobe, right bronchus or lung: Secondary | ICD-10-CM | POA: Diagnosis not present

## 2019-05-09 DIAGNOSIS — C3490 Malignant neoplasm of unspecified part of unspecified bronchus or lung: Secondary | ICD-10-CM

## 2019-05-09 LAB — CBC
HCT: 34.4 % — ABNORMAL LOW (ref 39.0–52.0)
Hemoglobin: 11.9 g/dL — ABNORMAL LOW (ref 13.0–17.0)
MCH: 36.5 pg — ABNORMAL HIGH (ref 26.0–34.0)
MCHC: 34.6 g/dL (ref 30.0–36.0)
MCV: 105.5 fL — ABNORMAL HIGH (ref 80.0–100.0)
Platelets: 94 10*3/uL — ABNORMAL LOW (ref 150–400)
RBC: 3.26 MIL/uL — ABNORMAL LOW (ref 4.22–5.81)
RDW: 14.8 % (ref 11.5–15.5)
WBC: 0.9 10*3/uL — CL (ref 4.0–10.5)
nRBC: 0 % (ref 0.0–0.2)

## 2019-05-12 ENCOUNTER — Ambulatory Visit
Admission: RE | Admit: 2019-05-12 | Discharge: 2019-05-12 | Disposition: A | Discharge status: Home / Self Care | Payer: Medicare PPO | Source: Ambulatory Visit | Attending: Radiation Oncology | Admitting: Radiation Oncology

## 2019-05-12 DIAGNOSIS — C3431 Malignant neoplasm of lower lobe, right bronchus or lung: Secondary | ICD-10-CM | POA: Diagnosis not present

## 2019-05-13 ENCOUNTER — Other Ambulatory Visit: Payer: Self-pay

## 2019-05-13 ENCOUNTER — Ambulatory Visit
Admission: RE | Admit: 2019-05-13 | Discharge: 2019-05-13 | Disposition: A | Discharge status: Home / Self Care | Payer: Medicare PPO | Source: Ambulatory Visit | Attending: Radiation Oncology | Admitting: Radiation Oncology

## 2019-05-13 ENCOUNTER — Encounter: Payer: Self-pay | Admitting: Pulmonary Disease

## 2019-05-13 ENCOUNTER — Ambulatory Visit: Payer: Medicare PPO | Admitting: Pulmonary Disease

## 2019-05-13 VITALS — BP 118/70 | HR 78 | Temp 97.8°F | Ht 66.0 in | Wt 171.8 lb

## 2019-05-13 DIAGNOSIS — C3490 Malignant neoplasm of unspecified part of unspecified bronchus or lung: Secondary | ICD-10-CM

## 2019-05-13 DIAGNOSIS — J441 Chronic obstructive pulmonary disease with (acute) exacerbation: Secondary | ICD-10-CM

## 2019-05-13 DIAGNOSIS — C3431 Malignant neoplasm of lower lobe, right bronchus or lung: Secondary | ICD-10-CM | POA: Diagnosis not present

## 2019-05-13 NOTE — Progress Notes (Signed)
 Assessment & Plan:  1. COPD with acute exacerbation (HCC) (Primary)  2. Small cell lung cancer [facility])   Patient Instructions  Continue Breztri  2 puffs twice a day  We will see you in follow-up in 3 months time  Please note: late entry documentation due to logistical difficulties during COVID-19 pandemic. This note is filed for information purposes only, and is not intended to be used for billing, nor does it represent the full scope/nature of the visit in question. Please see any associated scanned media linked to date of encounter for additional pertinent information.  Subjective:    HPI: Darryl White is a 79 y.o. male presenting to the pulmonology clinic on 05/13/2019 with report of: Follow-up (Patient reports that hes been doing pretty good. He states that Breztri  has been the best inhaler he has had thus far. )     Outpatient Encounter Medications as of 05/13/2019  Medication Sig   brimonidine  (ALPHAGAN ) 0.15 % ophthalmic solution Place 1 drop into both eyes 2 (two) times daily.    folic acid  (FOLVITE ) 1 MG tablet Take 1 mg by mouth daily.   gabapentin  (NEURONTIN ) 100 MG capsule Take 100 mg by mouth 3 (three) times daily.   lidocaine -prilocaine  (EMLA ) cream Apply to affected area once (Patient taking differently: Apply 1 application topically daily as needed (port flush). )   methotrexate 2.5 MG tablet Take 7.5 mg by mouth once a week.    ondansetron  (ZOFRAN ) 8 MG tablet Take 1 tablet (8 mg total) by mouth 2 (two) times daily as needed for refractory nausea / vomiting. Start on day 3 after carboplatin  chemo.   sodium chloride  (OCEAN) 0.65 % SOLN nasal spray Place 1 spray into both nostrils at bedtime as needed for congestion.    traMADol  (ULTRAM ) 50 MG tablet Take 25 mg by mouth 2 (two) times daily.    [DISCONTINUED] albuterol  (PROAIR  HFA) 108 (90 Base) MCG/ACT inhaler Inhale 1-2 puffs into the lungs every 6 (six) hours as needed for wheezing or shortness of breath.    [DISCONTINUED] antiseptic oral rinse (BIOTENE) LIQD 15 mLs by Mouth Rinse route as needed for dry mouth. (Patient not taking: Reported on 09/27/2019)   [DISCONTINUED] Budeson-Glycopyrrol-Formoterol  (BREZTRI  AEROSPHERE) 160-9-4.8 MCG/ACT AERO Inhale 2 puffs into the lungs 2 (two) times daily. (Patient taking differently: Inhale 2 puffs into the lungs 2 (two) times daily as needed (shortness of breath). )   [DISCONTINUED] chlorhexidine  (PERIDEX ) 0.12 % solution Use as directed 15 mLs in the mouth or throat in the morning and at bedtime.   [DISCONTINUED] chlorproMAZINE  (THORAZINE ) 25 MG tablet Take 1 tablet (25 mg total) by mouth 3 (three) times daily.   [DISCONTINUED] Cholecalciferol  (VITAMIN D3) 2000 UNITS capsule Take 2,000 Units by mouth daily with lunch.    [DISCONTINUED] dexamethasone  (DECADRON ) 4 MG tablet Take 2 tablets (8 mg total) by mouth daily. Start the day after chemotherapy for 1 day. (Patient not taking: Reported on 05/27/2019)   [DISCONTINUED] dextromethorphan-guaiFENesin  (MUCINEX  DM) 30-600 MG 12hr tablet Take 1 tablet by mouth 2 (two) times daily as needed (congestion/cough).    [DISCONTINUED] ELIQUIS  5 MG TABS tablet TAKE 1 TABLET BY MOUTH TWICE DAILY (Patient not taking: Reported on 05/14/2019)   [DISCONTINUED] famotidine  (PEPCID ) 20 MG tablet Take 20 mg by mouth 2 (two) times daily.   [DISCONTINUED] fenofibrate  (TRICOR ) 145 MG tablet Take 1 tablet (145 mg total) by mouth daily.   [DISCONTINUED] furosemide  (LASIX ) 20 MG tablet Take 1 tablet (20 mg total) by  mouth daily. (Patient not taking: Reported on 05/27/2019)   [DISCONTINUED] prochlorperazine  (COMPAZINE ) 10 MG tablet Take 1 tablet (10 mg total) by mouth every 6 (six) hours as needed (Nausea or vomiting).   [DISCONTINUED] ciprofloxacin  (CIPRO ) 500 MG tablet Take 1 tablet (500 mg total) by mouth daily with breakfast.   No facility-administered encounter medications on file as of 05/13/2019.      Objective:   Vitals:   05/13/19 1624   BP: 118/70  Pulse: 78  Temp: 97.8 F (36.6 C)  Height: 5' 6 (1.676 m)  Weight: 171 lb 12.8 oz (77.9 kg)  SpO2: 96%  TempSrc: Temporal  BMI (Calculated): 27.74     Physical exam documentation is limited by delayed entry of information.

## 2019-05-13 NOTE — Patient Instructions (Signed)
Continue Breztri 2 puffs twice a day  We will see you in follow-up in 3 months time

## 2019-05-14 ENCOUNTER — Encounter: Payer: Self-pay | Admitting: *Deleted

## 2019-05-14 ENCOUNTER — Ambulatory Visit
Admission: RE | Admit: 2019-05-14 | Discharge: 2019-05-14 | Disposition: A | Discharge status: Home / Self Care | Payer: Medicare PPO | Source: Ambulatory Visit | Attending: Radiation Oncology | Admitting: Radiation Oncology

## 2019-05-14 ENCOUNTER — Inpatient Hospital Stay: Payer: Medicare PPO | Admitting: Oncology

## 2019-05-14 ENCOUNTER — Inpatient Hospital Stay: Payer: Medicare PPO

## 2019-05-14 ENCOUNTER — Encounter: Payer: Self-pay | Admitting: Oncology

## 2019-05-14 VITALS — BP 131/79 | HR 83 | Temp 97.3°F | Resp 20 | Wt 171.0 lb

## 2019-05-14 DIAGNOSIS — C3481 Malignant neoplasm of overlapping sites of right bronchus and lung: Secondary | ICD-10-CM

## 2019-05-14 DIAGNOSIS — M069 Rheumatoid arthritis, unspecified: Secondary | ICD-10-CM | POA: Diagnosis not present

## 2019-05-14 DIAGNOSIS — Z5111 Encounter for antineoplastic chemotherapy: Secondary | ICD-10-CM | POA: Diagnosis not present

## 2019-05-14 DIAGNOSIS — C3490 Malignant neoplasm of unspecified part of unspecified bronchus or lung: Secondary | ICD-10-CM

## 2019-05-14 DIAGNOSIS — T451X5A Adverse effect of antineoplastic and immunosuppressive drugs, initial encounter: Secondary | ICD-10-CM

## 2019-05-14 DIAGNOSIS — Z95828 Presence of other vascular implants and grafts: Secondary | ICD-10-CM

## 2019-05-14 DIAGNOSIS — C3431 Malignant neoplasm of lower lobe, right bronchus or lung: Secondary | ICD-10-CM | POA: Diagnosis not present

## 2019-05-14 DIAGNOSIS — D701 Agranulocytosis secondary to cancer chemotherapy: Secondary | ICD-10-CM

## 2019-05-14 DIAGNOSIS — D696 Thrombocytopenia, unspecified: Secondary | ICD-10-CM

## 2019-05-14 LAB — CBC WITH DIFFERENTIAL/PLATELET
Abs Immature Granulocytes: 0.03 10*3/uL (ref 0.00–0.07)
Basophils Absolute: 0 10*3/uL (ref 0.0–0.1)
Basophils Relative: 0 %
Eosinophils Absolute: 0 10*3/uL (ref 0.0–0.5)
Eosinophils Relative: 1 %
HCT: 34.6 % — ABNORMAL LOW (ref 39.0–52.0)
Hemoglobin: 11.9 g/dL — ABNORMAL LOW (ref 13.0–17.0)
Immature Granulocytes: 1 %
Lymphocytes Relative: 22 %
Lymphs Abs: 0.5 10*3/uL — ABNORMAL LOW (ref 0.7–4.0)
MCH: 36.1 pg — ABNORMAL HIGH (ref 26.0–34.0)
MCHC: 34.4 g/dL (ref 30.0–36.0)
MCV: 104.8 fL — ABNORMAL HIGH (ref 80.0–100.0)
Monocytes Absolute: 0.5 10*3/uL (ref 0.1–1.0)
Monocytes Relative: 21 %
Neutro Abs: 1.3 10*3/uL — ABNORMAL LOW (ref 1.7–7.7)
Neutrophils Relative %: 55 %
Platelets: 157 10*3/uL (ref 150–400)
RBC: 3.3 MIL/uL — ABNORMAL LOW (ref 4.22–5.81)
RDW: 15.6 % — ABNORMAL HIGH (ref 11.5–15.5)
WBC: 2.4 10*3/uL — ABNORMAL LOW (ref 4.0–10.5)
nRBC: 0 % (ref 0.0–0.2)

## 2019-05-14 LAB — COMPREHENSIVE METABOLIC PANEL
ALT: 19 U/L (ref 0–44)
AST: 30 U/L (ref 15–41)
Albumin: 3.7 g/dL (ref 3.5–5.0)
Alkaline Phosphatase: 77 U/L (ref 38–126)
Anion gap: 6 (ref 5–15)
BUN: 14 mg/dL (ref 8–23)
CO2: 23 mmol/L (ref 22–32)
Calcium: 8.8 mg/dL — ABNORMAL LOW (ref 8.9–10.3)
Chloride: 104 mmol/L (ref 98–111)
Creatinine, Ser: 1.03 mg/dL (ref 0.61–1.24)
GFR calc Af Amer: >60 mL/min (ref >60)
GFR calc non Af Amer: >60 mL/min (ref >60)
Glucose, Bld: 111 mg/dL — ABNORMAL HIGH (ref 70–99)
Potassium: 4 mmol/L (ref 3.5–5.1)
Sodium: 133 mmol/L — ABNORMAL LOW (ref 135–145)
Total Bilirubin: 0.5 mg/dL (ref 0.3–1.2)
Total Protein: 6.3 g/dL — ABNORMAL LOW (ref 6.5–8.1)

## 2019-05-14 MED ORDER — HEPARIN SOD (PORK) LOCK FLUSH 100 UNIT/ML IV SOLN
INTRAVENOUS | Status: AC
  Filled 2019-05-14: qty 5

## 2019-05-14 MED ORDER — HEPARIN SOD (PORK) LOCK FLUSH 100 UNIT/ML IV SOLN
500.0000 [IU] | Freq: Once | INTRAVENOUS | Status: AC | PRN
  Administered 2019-05-14: 500 [IU]
  Filled 2019-05-14: qty 5

## 2019-05-14 MED ORDER — SODIUM CHLORIDE 0.9 % IV SOLN
Freq: Once | INTRAVENOUS | Status: AC
  Filled 2019-05-14: qty 250

## 2019-05-14 MED ORDER — SODIUM CHLORIDE 0.9% FLUSH
10.0000 mL | Freq: Once | INTRAVENOUS | Status: AC
  Administered 2019-05-14: 10 mL via INTRAVENOUS
  Filled 2019-05-14: qty 10

## 2019-05-14 MED ORDER — SODIUM CHLORIDE 0.9 % IV SOLN
453.5000 mg | Freq: Once | INTRAVENOUS | Status: AC
  Administered 2019-05-14: 450 mg via INTRAVENOUS
  Filled 2019-05-14: qty 45

## 2019-05-14 MED ORDER — SODIUM CHLORIDE 0.9 % IV SOLN
10.0000 mg | Freq: Once | INTRAVENOUS | Status: AC
  Administered 2019-05-14: 10 mg via INTRAVENOUS
  Filled 2019-05-14: qty 1

## 2019-05-14 MED ORDER — PALONOSETRON HCL INJECTION 0.25 MG/5ML
0.2500 mg | Freq: Once | INTRAVENOUS | Status: AC
  Administered 2019-05-14: 0.25 mg via INTRAVENOUS
  Filled 2019-05-14: qty 5

## 2019-05-14 MED ORDER — SODIUM CHLORIDE 0.9 % IV SOLN
150.0000 mg | Freq: Once | INTRAVENOUS | Status: AC
  Administered 2019-05-14: 150 mg via INTRAVENOUS
  Filled 2019-05-14: qty 150

## 2019-05-14 MED ORDER — SODIUM CHLORIDE 0.9 % IV SOLN
100.0000 mg/m2 | Freq: Once | INTRAVENOUS | Status: AC
  Administered 2019-05-14: 11:00:00 190 mg via INTRAVENOUS
  Filled 2019-05-14: qty 9.5

## 2019-05-14 NOTE — Progress Notes (Signed)
  Oncology Nurse Navigator Documentation  Navigator Location: [facility]-Med Onc (05/14/19 1000)   )Navigator Encounter Type: Lobby (05/14/19 1000)                     Patient Visit Type: MedOnc (05/14/19 1000)   Barriers/Navigation Needs: No Barriers At This Time (05/14/19 1000)   Interventions: None Required (05/14/19 1000)                      Time Spent with Patient: 30 (05/14/19 1000)

## 2019-05-14 NOTE — Progress Notes (Signed)
Patient does not offer any problems today.  

## 2019-05-14 NOTE — Progress Notes (Signed)
Hematology/Oncology follow up note [facility] Telephone:([phone]) [phone] Fax:([phone]) [phone]   Patient Care Team: Pleas Koch, NP as PCP - General (Internal Medicine) Deboraha Sprang, MD as Consulting Physician (Cardiology) Marlowe Sax, MD as Referring Physician (Internal Medicine) Birder Robson, MD as Referring Physician (Ophthalmology) Oneta Rack, MD as Referring Physician (Dermatology) Salli Real, DDS as Referring Physician (Dentistry) Telford Nab, RN as Oncology Nurse Navigator Earlie Server, MD as Consulting Physician (Hematology and Oncology)  REFERRING PROVIDER: Pleas Koch, NP  CHIEF COMPLAINTS/REASON FOR VISIT:  Follow-up for management of lung cancer  HISTORY OF PRESENTING ILLNESS:   Darryl White is a  79 y.o.  male with PMH listed below was seen in consultation at the request of  Pleas Koch, NP  for evaluation of lung mass April 2020 patient was evaluated by radiation oncology Dr. Baruch Gouty for right lung nodules concerning for progressive malignancy.   Patient had progressively enlarging media right lower lobe which was previously treated in April 2020 empirically with SBRT.  Biopsy was not done due to poor lung function secondary to COPD/moderate fibrosis. Patient also had a right upper lobe nodule being closely watched, and was found to have progressively enlarged on 10/29/2018 CT chest. 02/27/2019 patient had another CT done which showed marked progression of precarinal lymph node and a continued progression of peripheral right upper lobe pulmonary nodule. 03/20/2019 PET scan showed hypermetabolic right lower paratracheal and subcarinal adenopathy with hypermetabolic right upper lobe subpleural nodule, or significantly worsened compared to prior CT scan. Patient also had 1.2 x 1 cm left upper lobe confluent groundglass densities with low metabolic activity. Patient was referred to establish care with oncology  for further evaluation and management.  Patient reports chronic shortness of breath with exertion.  He follows up with pulmonology and was recently seen by Dr. Patsey Berthold.  Denies any hemoptysis,.  He has a history of extensive smoking history, 67-pack-year, quit in 2019. He lives with wife at home. -CT head is negative. MRI is contraindicated due to pace marker.   # PET scan images were reviewed and discussed with patient. Also discussed with patient about pathology report Pre-carinal lymph node EBUS guided FNA showed malignancy, metastatic high-grade carcinoma. 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  There are patchy areas with focal weak staining for CD56-which is a neuroendocrine marker.  However many clusters of tumor cells are negative.  While the morphology alone would support a diagnosis of small cell carcinoma, there is insufficient immunohistochemistry evidence to support any specific tumor differentiation. I discussed the possibility of small cell lung cancer which is more aggressive in nature and is related with worse outcome. Regimen with concurrent radiation and chemotherapy with carboplatin and taxol would not be sufficient for small cell lung cancer treatment.We will treat the benefit of doubt using small cell lung cancer regimen, he agrees with the plan. I confirmed with Dr. 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  # Patient developed multiple epistaxis episodes during the interval.  Some episodes takes about 20 to 30 minutes to stop.  Patient called RN navigator and I advised patient to stop Eliquis.  Patient has been on Eliquis 5 mg twice daily due to cardiology diseases.  Patient has contacted cardiology and [name] recommend  patient to hold off  Eliquis for now INTERVAL HISTORY Darryl White is a 79 y.o. male who has above history reviewed by me today presents for follow up visit for management of lung cancer-high-grade carcinoma Problems and complaints are listed below: Patient reports feeling well. Chemotherapy was delayed for a week due to neutropenia. Denies any fever, chills, nausea, vomiting. Mucositis have improved with use of Peridex.  No additional epistaxis episodes.   Review of Systems  Constitutional: Positive for fatigue. Negative for appetite change, chills, fever and unexpected weight change.  HENT:   Negative for hearing loss, nosebleeds and voice change.   Eyes: Negative for eye problems and icterus.  Respiratory: Negative for chest tightness, cough and shortness of breath.   Cardiovascular: Negative for chest pain and leg swelling.  Gastrointestinal: Negative for abdominal distention and abdominal pain.  Endocrine: Negative for hot flashes.  Genitourinary: Negative for difficulty urinating, dysuria and frequency.   Musculoskeletal: Negative for arthralgias.  Skin: Negative for itching and rash.  Neurological: Negative for light-headedness and numbness.  Hematological: Negative for adenopathy. Does not bruise/bleed easily.  Psychiatric/Behavioral: Negative for confusion.    MEDICAL HISTORY:  Past Medical History:  Diagnosis Date  . Arthritis 2019   dx of osteoarthritis and rhemautoid arthritis  . Cancer ([place])    lung  . Cataract 2019   left eye  . Chicken pox   . Collagen vascular disease ([place])   . 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 COPD (chronic obstructive pulmonary disease) ([place])   . Diverticulitis   . GERD (gastroesophageal reflux disease)   . 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 History of hiatal hernia   . History of stomach ulcers   . Hyperlipidemia   . Lymphocytic colitis   . Orthostatic hypotension   . Osteoarthritis   . Pancreatitis     . Paroxysmal atrial fibrillation (HCC)   . Presence of permanent cardiac pacemaker   . 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 APPENDECTOMY    . BACK SURGERY     bone chip lasered  . CARDIAC CATHETERIZATION    . CATARACT EXTRACTION W/ INTRAOCULAR LENS IMPLANT Right   . COLON SURGERY  1990's   12" removed of large intestine  . EYE SURGERY    . INSERT / REPLACE / [place]  . large intestine block    . PACEMAKER INSERTION     MDT dual chamber pacemaker  . PORTA CATH INSERTION N/A 04/17/2019   Procedure: PORTA CATH INSERTION;  Surgeon: Algernon Huxley, MD;  Location: [place] [facility];  Service: Cardiovascular;  Laterality: N/A;  . PPM GENERATOR CHANGEOUT N/A 12/02/2018   Procedure: PPM GENERATOR CHANGEOUT;  Surgeon: Deboraha Sprang, MD;  Location: [place] [facility];  Service: Cardiovascular;  Laterality: N/A;  . TONSILLECTOMY AND ADENOIDECTOMY    . VIDEO BRONCHOSCOPY WITH ENDOBRONCHIAL ULTRASOUND N/A 04/07/2019   Procedure: VIDEO BRONCHOSCOPY WITH ENDOBRONCHIAL ULTRASOUND;  Surgeon: Tyler Pita, MD;  Location: [facility];  Service: Pulmonary;  Laterality: N/A;    SOCIAL HISTORY: Social History   Socioeconomic History  . Marital status: Married    Spouse name: Not on file  . Number of children: 2  . Years of education: Not on file  . Highest education level: Not on file  Occupational History  . Occupation: Retired  Tobacco Use  . Smoking status: Former  Smoker    Packs/day: 1.00    Years: 60.00    Pack years: 60.00    Types: Cigarettes    Quit date: 04/29/2017    Years since quitting: 2.0  . Smokeless tobacco: Never Used  Substance and Sexual Activity  . Alcohol use: Yes    Alcohol/week: 5.0 standard drinks    Types: 5 Glasses of wine per week  . Drug use: No  . Sexual activity: Never  Other Topics Concern  . Not on file  Social History Narrative   Married.   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      Does not have a living will.   Desires CPR, does not want prolonged life support if futile.   Social Determinants of Health   Financial Resource Strain: Low Risk   . Difficulty of Paying Living Expenses: Not hard at all  Food Insecurity: No Food Insecurity  . Worried About Charity fundraiser in the Last Year: Never true  . Ran Out of Food in the Last Year: Never true  Transportation Needs: No Transportation Needs  . Lack of Transportation (Medical): No  . Lack of Transportation (Non-Medical): No  Physical Activity: Sufficiently Active  . Days of Exercise per Week: 7 days  . Minutes of Exercise per Session: 60 min  Stress: No Stress Concern Present  . Feeling of Stress : Not at all  Social Connections:   . Frequency of Communication with Friends and Family:   . Frequency of Social Gatherings with Friends and Family:   . Attends Religious Services:   . Active Member of Clubs or Organizations:   . Attends Archivist Meetings:   Marland Kitchen Marital Status:   Intimate Partner Violence: Not At Risk  . Fear of Current or Ex-Partner: No  . Emotionally Abused: No  . Physically Abused: No  . Sexually Abused: No    FAMILY HISTORY: Family History  Problem Relation Age of Onset  . Lung cancer Mother   . Heart disease Father   . Alzheimer's disease Father     ALLERGIES:  is allergic to sulfa antibiotics and penicillins.  MEDICATIONS:  Current Outpatient Medications  Medication Sig Dispense Refill  . albuterol (PROAIR HFA) 108 (90 Base) MCG/ACT inhaler Inhale 1-2 puffs into the lungs every 6 (six) hours as needed for wheezing or shortness of breath. 1 Inhaler 5  . antiseptic oral rinse (BIOTENE) LIQD 15 mLs by Mouth Rinse route as needed for dry mouth.    . brimonidine (ALPHAGAN) 0.15 % ophthalmic solution Place 1 drop into both eyes 2 (two) times daily.     . Budeson-Glycopyrrol-Formoterol (BREZTRI AEROSPHERE) 160-9-4.8 MCG/ACT AERO  Inhale 2 puffs into the lungs 2 (two) times daily. 10.7 g 6  . chlorhexidine (PERIDEX) 0.12 % solution Use as directed 15 mLs in the mouth or throat in the morning and at bedtime. 473 mL 1  . chlorproMAZINE (THORAZINE) 25 MG tablet Take 1 tablet (25 mg total) by mouth 3 (three) times daily. 30 tablet 0  . Cholecalciferol (VITAMIN D3) 2000 UNITS capsule Take 2,000 Units by mouth daily with lunch.     . dexamethasone (DECADRON) 4 MG tablet Take 2 tablets (8 mg total) by mouth daily. Start the day after chemotherapy for 1 day. 30 tablet 1  . dextromethorphan-guaiFENesin (MUCINEX DM) 30-600 MG 12hr tablet Take 1 tablet by mouth 2 (two) times daily as needed (congestion/cough).     . famotidine (PEPCID) 20 MG tablet Take 20 mg  by mouth 2 (two) times daily.    . fenofibrate (TRICOR) 145 MG tablet Take 1 tablet (145 mg total) by mouth daily. 90 tablet 1  . folic acid (FOLVITE) 1 MG tablet Take 1 mg by mouth daily.    . furosemide (LASIX) 20 MG tablet Take 1 tablet (20 mg total) by mouth daily. 10 tablet 0  . gabapentin (NEURONTIN) 100 MG capsule Take 100 mg by mouth 3 (three) times daily.    Marland Kitchen lidocaine-prilocaine (EMLA) cream Apply to affected area once 30 g 3  . ondansetron (ZOFRAN) 8 MG tablet Take 1 tablet (8 mg total) by mouth 2 (two) times daily as needed for refractory nausea / vomiting. Start on day 3 after carboplatin chemo. 30 tablet 1  . prochlorperazine (COMPAZINE) 10 MG tablet Take 1 tablet (10 mg total) by mouth every 6 (six) hours as needed (Nausea or vomiting). 30 tablet 1  . sodium chloride (OCEAN) 0.65 % SOLN nasal spray Place 1 spray into both nostrils at bedtime as needed for congestion.     . traMADol (ULTRAM) 50 MG tablet Take 25 mg by mouth at bedtime as needed for moderate pain.     Marland Kitchen ELIQUIS 5 MG TABS tablet TAKE 1 TABLET BY MOUTH TWICE DAILY (Patient not taking: Reported on 05/14/2019) 60 tablet 6  . methotrexate 2.5 MG tablet Take 12.5 mg by mouth every Sunday.      No current  facility-administered medications for this visit.     PHYSICAL EXAMINATION: ECOG PERFORMANCE STATUS: 1 - Symptomatic but completely ambulatory Vitals:   05/14/19 0849  BP: 131/79  Pulse: 83  Resp: 20  Temp: (!) 97.3 F (36.3 C)   Filed Weights   05/14/19 0849  Weight: 171 lb (77.6 kg)    Physical Exam Constitutional:      General: He is not in acute distress.    Comments: Patient walks independently  HENT:     Head: Normocephalic and atraumatic.  Eyes:     General: No scleral icterus. Cardiovascular:     Rate and Rhythm: Normal rate and regular rhythm.     Heart sounds: Normal heart sounds.  Pulmonary:     Effort: Pulmonary effort is normal. No respiratory distress.     Breath sounds: No wheezing.     Comments: Severely decreased breath sound bilaterally. Abdominal:     General: Bowel sounds are normal. There is no distension.     Palpations: Abdomen is soft.  Musculoskeletal:        General: No deformity. Normal range of motion.     Cervical back: Normal range of motion and neck supple.  Skin:    General: Skin is warm and dry.     Findings: No erythema or rash.  Neurological:     Mental Status: He is alert and oriented to person, place, and time. Mental status is at baseline.     Cranial Nerves: No cranial nerve deficit.     Coordination: Coordination normal.  Psychiatric:        Mood and Affect: Mood normal.     LABORATORY DATA:  I have reviewed the data as listed Lab Results  Component Value Date   WBC 2.4 (L) 05/14/2019   HGB 11.9 (L) 05/14/2019   HCT 34.6 (L) 05/14/2019   MCV 104.8 (H) 05/14/2019   PLT 157 05/14/2019   Recent Labs    04/29/19 1051 05/07/19 0955 05/14/19 0827  NA 131* 134* 133*  K 4.6 4.1 4.0  CL  99 102 104  CO2 25 25 23   GLUCOSE 97 102* 111*  BUN 30* 14 14  CREATININE 1.00 1.08 1.03  CALCIUM 9.2 9.0 8.8*  GFRNONAA >60 >60 >60  GFRAA >60 >60 >60  PROT 6.4* 6.9 6.3*  ALBUMIN 3.7 3.9 3.7  AST 30 32 30  ALT 21 21 19     ALKPHOS 58 74 77  BILITOT 1.1 0.7 0.5   Iron/TIBC/Ferritin/ %Sat No results found for: IRON, TIBC, FERRITIN, IRONPCTSAT    RADIOGRAPHIC STUDIES: I have personally reviewed the radiological images as listed and agreed with the findings in the report.  PERIPHERAL VASCULAR CATHETERIZATION  Result Date: 04/17/2019 See op note  CUP PACEART REMOTE DEVICE CHECK  Result Date: 04/18/2019 Scheduled remote reviewed.  Normal device function.  Next remote 91 days.     ASSESSMENT & PLAN:  1. Malignant neoplasm of overlapping sites of right lung ([place])   2. Chemotherapy-induced neutropenia (HCC)   3. Rheumatoid arthritis, involving unspecified site, unspecified whether rheumatoid factor present ([place])   4. Encounter for antineoplastic chemotherapy   5. 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  Clinically T1c N2 M0.  Currently on concurrent chemo and radiation Chemotherapy is delayed due to neutropenia. Patient took a course of 7 days empiric Cipro. Lab was reviewed and discussed with patient. Neutropenia has recovered Acceptable to proceed with cycle 2 carboplatin/etoposide.  #Thrombocytopenia, platelet count decreased to 94,000, today recovered and normalized. Patient is temporarily off Eliquis due to epistaxis episodes. Need to resume once his treatment is done  #Chronic rheumatoid arthritis, currently off methotrexate.  Symptoms are stable.  Left lung nodule as much lower-grade FDG activity on the PET scan, possible malignancy, a secondary primary vs metastatic lesion from right lung cancer.  Attention on follow-up.   # hyponatremia, encourage oral hydration.   All questions were answered. The patient knows to call the clinic with any problems questions or concerns.  Return of visit: Lab in 1 week, follow-up lab MD evaluation prior to next chemotherapy in 3 weeks.    Earlie Server, MD, PhD Hematology  Oncology [facility] Pager- [phone] 05/14/2019

## 2019-05-15 ENCOUNTER — Inpatient Hospital Stay: Payer: Medicare PPO

## 2019-05-15 ENCOUNTER — Ambulatory Visit
Admission: RE | Admit: 2019-05-15 | Discharge: 2019-05-15 | Disposition: A | Discharge status: Home / Self Care | Payer: Medicare PPO | Source: Ambulatory Visit | Attending: Radiation Oncology | Admitting: Radiation Oncology

## 2019-05-15 VITALS — BP 127/71 | Temp 97.1°F | Resp 20

## 2019-05-15 DIAGNOSIS — Z5111 Encounter for antineoplastic chemotherapy: Secondary | ICD-10-CM | POA: Diagnosis not present

## 2019-05-15 DIAGNOSIS — C3431 Malignant neoplasm of lower lobe, right bronchus or lung: Secondary | ICD-10-CM | POA: Diagnosis not present

## 2019-05-15 DIAGNOSIS — C3490 Malignant neoplasm of unspecified part of unspecified bronchus or lung: Secondary | ICD-10-CM

## 2019-05-15 MED ORDER — SODIUM CHLORIDE 0.9 % IV SOLN
100.0000 mg/m2 | Freq: Once | INTRAVENOUS | Status: AC
  Administered 2019-05-15: 190 mg via INTRAVENOUS
  Filled 2019-05-15: qty 9.5

## 2019-05-15 MED ORDER — SODIUM CHLORIDE 0.9 % IV SOLN
10.0000 mg | Freq: Once | INTRAVENOUS | Status: AC
  Administered 2019-05-15: 10 mg via INTRAVENOUS
  Filled 2019-05-15: qty 10

## 2019-05-15 MED ORDER — SODIUM CHLORIDE 0.9 % IV SOLN
Freq: Once | INTRAVENOUS | Status: AC
  Filled 2019-05-15: qty 250

## 2019-05-15 MED ORDER — HEPARIN SOD (PORK) LOCK FLUSH 100 UNIT/ML IV SOLN
500.0000 [IU] | Freq: Once | INTRAVENOUS | Status: AC | PRN
  Administered 2019-05-15: 500 [IU]
  Filled 2019-05-15: qty 5

## 2019-05-16 ENCOUNTER — Inpatient Hospital Stay: Payer: Medicare PPO

## 2019-05-16 ENCOUNTER — Ambulatory Visit
Admission: RE | Admit: 2019-05-16 | Discharge: 2019-05-16 | Disposition: A | Discharge status: Home / Self Care | Payer: Medicare PPO | Source: Ambulatory Visit | Attending: Radiation Oncology | Admitting: Radiation Oncology

## 2019-05-16 VITALS — BP 125/75 | HR 79 | Temp 96.9°F | Resp 18

## 2019-05-16 DIAGNOSIS — C3431 Malignant neoplasm of lower lobe, right bronchus or lung: Secondary | ICD-10-CM | POA: Diagnosis not present

## 2019-05-16 DIAGNOSIS — Z5111 Encounter for antineoplastic chemotherapy: Secondary | ICD-10-CM | POA: Diagnosis not present

## 2019-05-16 DIAGNOSIS — C3490 Malignant neoplasm of unspecified part of unspecified bronchus or lung: Secondary | ICD-10-CM

## 2019-05-16 MED ORDER — SODIUM CHLORIDE 0.9 % IV SOLN
100.0000 mg/m2 | Freq: Once | INTRAVENOUS | Status: AC
  Administered 2019-05-16: 190 mg via INTRAVENOUS
  Filled 2019-05-16: qty 9.5

## 2019-05-16 MED ORDER — HEPARIN SOD (PORK) LOCK FLUSH 100 UNIT/ML IV SOLN
500.0000 [IU] | Freq: Once | INTRAVENOUS | Status: AC | PRN
  Administered 2019-05-16: 500 [IU]
  Filled 2019-05-16: qty 5

## 2019-05-16 MED ORDER — SODIUM CHLORIDE 0.9 % IV SOLN
Freq: Once | INTRAVENOUS | Status: AC
  Filled 2019-05-16: qty 250

## 2019-05-16 MED ORDER — SODIUM CHLORIDE 0.9 % IV SOLN
10.0000 mg | Freq: Once | INTRAVENOUS | Status: AC
  Administered 2019-05-16: 10 mg via INTRAVENOUS
  Filled 2019-05-16: qty 10

## 2019-05-19 ENCOUNTER — Ambulatory Visit
Admission: RE | Admit: 2019-05-19 | Discharge: 2019-05-19 | Disposition: A | Discharge status: Home / Self Care | Payer: Medicare PPO | Source: Ambulatory Visit | Attending: Radiation Oncology | Admitting: Radiation Oncology

## 2019-05-19 ENCOUNTER — Inpatient Hospital Stay (HOSPITAL_BASED_OUTPATIENT_CLINIC_OR_DEPARTMENT_OTHER): Payer: Medicare PPO | Admitting: Hospice and Palliative Medicine

## 2019-05-19 DIAGNOSIS — Z515 Encounter for palliative care: Secondary | ICD-10-CM

## 2019-05-19 DIAGNOSIS — R110 Nausea: Secondary | ICD-10-CM

## 2019-05-19 DIAGNOSIS — C3490 Malignant neoplasm of unspecified part of unspecified bronchus or lung: Secondary | ICD-10-CM

## 2019-05-19 DIAGNOSIS — C3431 Malignant neoplasm of lower lobe, right bronchus or lung: Secondary | ICD-10-CM | POA: Diagnosis not present

## 2019-05-19 NOTE — Progress Notes (Signed)
Virtual Visit via Telephone Note  I connected with TYRIQ MORAGNE on 05/19/19 at  8:30 AM EDT by telephone and verified that I am speaking with the correct person using two identifiers.   I discussed the limitations, risks, security and privacy concerns of performing an evaluation and management service by telephone and the availability of in person appointments. I also discussed with the patient that there may be a patient responsible charge related to this service. The patient expressed understanding and agreed to proceed.   History of Present Illness: RANDALE CARVALHO is a 79 y.o. male with multiple medical problems including history of lung mass empirically treated with SBRT with deferred biopsy due to COPD/pulmonary fibrosis.  Patient now found to have progressive enlargement of lung malignancy.  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  Patient is being treated aggressively with carboplatin/etoposide.  Palliative care was consulted to help address goals and manage ongoing symptoms.   Observations/Objective: I called and spoke with patient and wife by phone.  Patient reports feeling a bit under the weather following his last chemotherapy treatment.  He has had some nausea and developed bilateral lower extremity edema, which resolved after use of as needed diuretics.  Patient also endorses bilateral heel pain that is exacerbated with ambulation.  Wife is concerned that patient has not been eating well over the weekend.  Symptoms are likely secondary to sequelae from his chemotherapy.  Discussed as needed use of antiemetics and analgesics, which he has both available at home.  Discussed adding oral supplements.  Weight trends have been stable but will continue to follow.  Can consider referral to a dietitian if necessary.  We discussed  eating smaller, more frequent meals throughout the day with a focus on high protein and high calorie foods.  Regarding his bilateral heel pain, clinically suspicious for plantar fasciitis.  Discussed use of ice and stretching exercises.  Assessment and Plan: Right lung cancer suspicious for small cell lung cancer -on treatment with carboplatin and etoposide.  Followed by Dr. Tasia Catchings.  Also receiving radiation.  Nutrition -recommended oral supplements once or twice daily  Nausea -antiemetics as needed  Pain -acetaminophen and/or tramadol as needed  Bilateral heel pain -suspicious for plantar fasciitis.  Can be examined at time of next clinic visit.  May use ice as needed and stretching exercises  ACP -to be addressed when next seen in the clinic  Follow Up Instructions: Follow-up virtual visit about a month   I discussed the assessment and treatment plan with the patient. The patient was provided an opportunity to ask questions and all were answered. The patient agreed with the plan and demonstrated an understanding of the instructions.   The patient was advised to call back or seek an in-person evaluation if the symptoms worsen or if the condition fails to improve as anticipated.  I provided 13 minutes of non-face-to-face time during this encounter.   Irean Hong, NP

## 2019-05-20 ENCOUNTER — Other Ambulatory Visit: Payer: Self-pay | Admitting: *Deleted

## 2019-05-20 ENCOUNTER — Ambulatory Visit
Admission: RE | Admit: 2019-05-20 | Discharge: 2019-05-20 | Disposition: A | Discharge status: Home / Self Care | Payer: Medicare PPO | Source: Ambulatory Visit | Attending: Radiation Oncology | Admitting: Radiation Oncology

## 2019-05-20 DIAGNOSIS — C3431 Malignant neoplasm of lower lobe, right bronchus or lung: Secondary | ICD-10-CM | POA: Diagnosis not present

## 2019-05-20 MED ORDER — SUCRALFATE 1 G PO TABS: 1.0000 g | ORAL_TABLET | Freq: Three times a day (TID) | ORAL | 3 refills | Status: DC

## 2019-05-21 ENCOUNTER — Inpatient Hospital Stay: Payer: Medicare PPO

## 2019-05-21 ENCOUNTER — Other Ambulatory Visit: Payer: Self-pay

## 2019-05-21 ENCOUNTER — Ambulatory Visit
Admission: RE | Admit: 2019-05-21 | Discharge: 2019-05-21 | Disposition: A | Discharge status: Home / Self Care | Payer: Medicare PPO | Source: Ambulatory Visit | Attending: Radiation Oncology | Admitting: Radiation Oncology

## 2019-05-21 DIAGNOSIS — C3490 Malignant neoplasm of unspecified part of unspecified bronchus or lung: Secondary | ICD-10-CM

## 2019-05-21 DIAGNOSIS — Z5111 Encounter for antineoplastic chemotherapy: Secondary | ICD-10-CM | POA: Diagnosis not present

## 2019-05-21 DIAGNOSIS — C3431 Malignant neoplasm of lower lobe, right bronchus or lung: Secondary | ICD-10-CM | POA: Diagnosis not present

## 2019-05-21 LAB — CBC WITH DIFFERENTIAL/PLATELET
Abs Immature Granulocytes: 0.04 10*3/uL (ref 0.00–0.07)
Basophils Absolute: 0 10*3/uL (ref 0.0–0.1)
Basophils Relative: 2 %
Eosinophils Absolute: 0 10*3/uL (ref 0.0–0.5)
Eosinophils Relative: 1 %
HCT: 33.6 % — ABNORMAL LOW (ref 39.0–52.0)
Hemoglobin: 11.7 g/dL — ABNORMAL LOW (ref 13.0–17.0)
Immature Granulocytes: 3 %
Lymphocytes Relative: 25 %
Lymphs Abs: 0.4 10*3/uL — ABNORMAL LOW (ref 0.7–4.0)
MCH: 36.6 pg — ABNORMAL HIGH (ref 26.0–34.0)
MCHC: 34.8 g/dL (ref 30.0–36.0)
MCV: 105 fL — ABNORMAL HIGH (ref 80.0–100.0)
Monocytes Absolute: 0.1 10*3/uL (ref 0.1–1.0)
Monocytes Relative: 3 %
Neutro Abs: 1.1 10*3/uL — ABNORMAL LOW (ref 1.7–7.7)
Neutrophils Relative %: 66 %
Platelets: 141 10*3/uL — ABNORMAL LOW (ref 150–400)
RBC: 3.2 MIL/uL — ABNORMAL LOW (ref 4.22–5.81)
RDW: 15.6 % — ABNORMAL HIGH (ref 11.5–15.5)
WBC: 1.6 10*3/uL — ABNORMAL LOW (ref 4.0–10.5)
nRBC: 0 % (ref 0.0–0.2)

## 2019-05-21 LAB — COMPREHENSIVE METABOLIC PANEL
ALT: 17 U/L (ref 0–44)
AST: 29 U/L (ref 15–41)
Albumin: 3.5 g/dL (ref 3.5–5.0)
Alkaline Phosphatase: 83 U/L (ref 38–126)
Anion gap: 8 (ref 5–15)
BUN: 29 mg/dL — ABNORMAL HIGH (ref 8–23)
CO2: 25 mmol/L (ref 22–32)
Calcium: 9.2 mg/dL (ref 8.9–10.3)
Chloride: 99 mmol/L (ref 98–111)
Creatinine, Ser: 1.12 mg/dL (ref 0.61–1.24)
GFR calc Af Amer: >60 mL/min (ref >60)
GFR calc non Af Amer: >60 mL/min (ref >60)
Glucose, Bld: 90 mg/dL (ref 70–99)
Potassium: 4.3 mmol/L (ref 3.5–5.1)
Sodium: 132 mmol/L — ABNORMAL LOW (ref 135–145)
Total Bilirubin: 1.1 mg/dL (ref 0.3–1.2)
Total Protein: 6.7 g/dL (ref 6.5–8.1)

## 2019-05-22 ENCOUNTER — Other Ambulatory Visit: Payer: Self-pay | Admitting: Internal Medicine

## 2019-05-22 ENCOUNTER — Ambulatory Visit
Admission: RE | Admit: 2019-05-22 | Discharge: 2019-05-22 | Disposition: A | Discharge status: Home / Self Care | Payer: Medicare PPO | Source: Ambulatory Visit | Attending: Radiation Oncology | Admitting: Radiation Oncology

## 2019-05-22 ENCOUNTER — Other Ambulatory Visit: Payer: Self-pay | Admitting: Pulmonary Disease

## 2019-05-22 DIAGNOSIS — I480 Paroxysmal atrial fibrillation: Secondary | ICD-10-CM

## 2019-05-22 DIAGNOSIS — R918 Other nonspecific abnormal finding of lung field: Secondary | ICD-10-CM | POA: Insufficient documentation

## 2019-05-22 DIAGNOSIS — C3431 Malignant neoplasm of lower lobe, right bronchus or lung: Secondary | ICD-10-CM | POA: Diagnosis not present

## 2019-05-22 NOTE — Telephone Encounter (Signed)
This is a [employer] pt 

## 2019-05-23 ENCOUNTER — Ambulatory Visit
Admission: RE | Admit: 2019-05-23 | Discharge: 2019-05-23 | Disposition: A | Discharge status: Home / Self Care | Payer: Medicare PPO | Source: Ambulatory Visit | Attending: Radiation Oncology | Admitting: Radiation Oncology

## 2019-05-23 ENCOUNTER — Other Ambulatory Visit: Payer: Self-pay | Admitting: Oncology

## 2019-05-23 ENCOUNTER — Telehealth: Payer: Self-pay | Admitting: *Deleted

## 2019-05-23 DIAGNOSIS — C3431 Malignant neoplasm of lower lobe, right bronchus or lung: Secondary | ICD-10-CM | POA: Diagnosis not present

## 2019-05-23 MED ORDER — LEVOFLOXACIN 500 MG PO TABS: 500.0000 mg | ORAL_TABLET | Freq: Every day | ORAL | 0 refills | Status: DC

## 2019-05-23 NOTE — Telephone Encounter (Signed)
Per patient he has no symptoms of feeling sick. Advised per doctor that antibiotics are being sent in to pharmacy and to continue monitoring his temp and to call if worsens or continues to be up. He was in agreement with this plan

## 2019-05-23 NOTE — Telephone Encounter (Signed)
Patient called reporting htat his temp this morning was 99.2, when he got home it was 100.5, and he took Tylenol and now temp is 99.5. He is asking if he is in trouble. Please advise

## 2019-05-23 NOTE — Telephone Encounter (Signed)
I will send him empiric antibiotics he can pick up and start. Thank you

## 2019-05-24 ENCOUNTER — Ambulatory Visit: Payer: Medicare PPO

## 2019-05-26 ENCOUNTER — Ambulatory Visit
Admission: RE | Admit: 2019-05-26 | Discharge: 2019-05-26 | Disposition: A | Discharge status: Home / Self Care | Payer: Medicare PPO | Source: Ambulatory Visit | Attending: Radiation Oncology | Admitting: Radiation Oncology

## 2019-05-26 DIAGNOSIS — D709 Neutropenia, unspecified: Secondary | ICD-10-CM | POA: Diagnosis not present

## 2019-05-27 ENCOUNTER — Other Ambulatory Visit: Payer: Self-pay

## 2019-05-27 ENCOUNTER — Encounter: Payer: Self-pay | Admitting: Oncology

## 2019-05-27 ENCOUNTER — Ambulatory Visit: Payer: Medicare PPO

## 2019-05-27 ENCOUNTER — Inpatient Hospital Stay: Payer: Medicare PPO | Attending: Oncology

## 2019-05-27 ENCOUNTER — Emergency Department: Payer: Medicare PPO

## 2019-05-27 ENCOUNTER — Encounter: Payer: Self-pay | Admitting: Internal Medicine

## 2019-05-27 ENCOUNTER — Inpatient Hospital Stay: Payer: Medicare PPO

## 2019-05-27 ENCOUNTER — Inpatient Hospital Stay
Admission: EM | Admit: 2019-05-27 | Discharge: 2019-05-30 | DRG: 809 | Disposition: A | Discharge status: Home / Self Care | Payer: Medicare PPO | Source: Ambulatory Visit | Attending: Internal Medicine | Admitting: Internal Medicine

## 2019-05-27 ENCOUNTER — Inpatient Hospital Stay (HOSPITAL_BASED_OUTPATIENT_CLINIC_OR_DEPARTMENT_OTHER): Payer: Medicare PPO | Admitting: Oncology

## 2019-05-27 VITALS — BP 128/71 | HR 101 | Temp 99.3°F | Resp 22

## 2019-05-27 DIAGNOSIS — D701 Agranulocytosis secondary to cancer chemotherapy: Secondary | ICD-10-CM | POA: Insufficient documentation

## 2019-05-27 DIAGNOSIS — J439 Emphysema, unspecified: Secondary | ICD-10-CM | POA: Diagnosis present

## 2019-05-27 DIAGNOSIS — Z6826 Body mass index (BMI) 26.0-26.9, adult: Secondary | ICD-10-CM

## 2019-05-27 DIAGNOSIS — M1990 Unspecified osteoarthritis, unspecified site: Secondary | ICD-10-CM | POA: Diagnosis present

## 2019-05-27 DIAGNOSIS — Z7289 Other problems related to lifestyle: Secondary | ICD-10-CM | POA: Insufficient documentation

## 2019-05-27 DIAGNOSIS — C3490 Malignant neoplasm of unspecified part of unspecified bronchus or lung: Secondary | ICD-10-CM

## 2019-05-27 DIAGNOSIS — Z8249 Family history of ischemic heart disease and other diseases of the circulatory system: Secondary | ICD-10-CM

## 2019-05-27 DIAGNOSIS — Z882 Allergy status to sulfonamides status: Secondary | ICD-10-CM

## 2019-05-27 DIAGNOSIS — T451X5A Adverse effect of antineoplastic and immunosuppressive drugs, initial encounter: Secondary | ICD-10-CM | POA: Diagnosis present

## 2019-05-27 DIAGNOSIS — D696 Thrombocytopenia, unspecified: Secondary | ICD-10-CM | POA: Insufficient documentation

## 2019-05-27 DIAGNOSIS — Z79899 Other long term (current) drug therapy: Secondary | ICD-10-CM | POA: Diagnosis not present

## 2019-05-27 DIAGNOSIS — D6959 Other secondary thrombocytopenia: Secondary | ICD-10-CM | POA: Diagnosis present

## 2019-05-27 DIAGNOSIS — J90 Pleural effusion, not elsewhere classified: Secondary | ICD-10-CM | POA: Insufficient documentation

## 2019-05-27 DIAGNOSIS — I4891 Unspecified atrial fibrillation: Secondary | ICD-10-CM | POA: Diagnosis present

## 2019-05-27 DIAGNOSIS — M8580 Other specified disorders of bone density and structure, unspecified site: Secondary | ICD-10-CM | POA: Insufficient documentation

## 2019-05-27 DIAGNOSIS — C3481 Malignant neoplasm of overlapping sites of right bronchus and lung: Secondary | ICD-10-CM

## 2019-05-27 DIAGNOSIS — Z20822 Contact with and (suspected) exposure to covid-19: Secondary | ICD-10-CM | POA: Diagnosis present

## 2019-05-27 DIAGNOSIS — Z8711 Personal history of peptic ulcer disease: Secondary | ICD-10-CM | POA: Diagnosis not present

## 2019-05-27 DIAGNOSIS — R040 Epistaxis: Secondary | ICD-10-CM | POA: Diagnosis present

## 2019-05-27 DIAGNOSIS — R0602 Shortness of breath: Secondary | ICD-10-CM | POA: Diagnosis not present

## 2019-05-27 DIAGNOSIS — Z7901 Long term (current) use of anticoagulants: Secondary | ICD-10-CM | POA: Diagnosis not present

## 2019-05-27 DIAGNOSIS — I480 Paroxysmal atrial fibrillation: Secondary | ICD-10-CM | POA: Diagnosis present

## 2019-05-27 DIAGNOSIS — N281 Cyst of kidney, acquired: Secondary | ICD-10-CM | POA: Insufficient documentation

## 2019-05-27 DIAGNOSIS — E440 Moderate protein-calorie malnutrition: Secondary | ICD-10-CM | POA: Diagnosis present

## 2019-05-27 DIAGNOSIS — R590 Localized enlarged lymph nodes: Secondary | ICD-10-CM | POA: Insufficient documentation

## 2019-05-27 DIAGNOSIS — D61810 Antineoplastic chemotherapy induced pancytopenia: Secondary | ICD-10-CM | POA: Diagnosis present

## 2019-05-27 DIAGNOSIS — R5383 Other fatigue: Secondary | ICD-10-CM | POA: Insufficient documentation

## 2019-05-27 DIAGNOSIS — D7589 Other specified diseases of blood and blood-forming organs: Secondary | ICD-10-CM | POA: Diagnosis present

## 2019-05-27 DIAGNOSIS — R634 Abnormal weight loss: Secondary | ICD-10-CM | POA: Insufficient documentation

## 2019-05-27 DIAGNOSIS — Z818 Family history of other mental and behavioral disorders: Secondary | ICD-10-CM | POA: Insufficient documentation

## 2019-05-27 DIAGNOSIS — C3491 Malignant neoplasm of unspecified part of right bronchus or lung: Secondary | ICD-10-CM | POA: Diagnosis present

## 2019-05-27 DIAGNOSIS — M069 Rheumatoid arthritis, unspecified: Secondary | ICD-10-CM | POA: Diagnosis present

## 2019-05-27 DIAGNOSIS — R5081 Fever presenting with conditions classified elsewhere: Secondary | ICD-10-CM | POA: Diagnosis not present

## 2019-05-27 DIAGNOSIS — Z923 Personal history of irradiation: Secondary | ICD-10-CM | POA: Insufficient documentation

## 2019-05-27 DIAGNOSIS — D6481 Anemia due to antineoplastic chemotherapy: Secondary | ICD-10-CM | POA: Insufficient documentation

## 2019-05-27 DIAGNOSIS — C3411 Malignant neoplasm of upper lobe, right bronchus or lung: Secondary | ICD-10-CM | POA: Insufficient documentation

## 2019-05-27 DIAGNOSIS — D649 Anemia, unspecified: Secondary | ICD-10-CM

## 2019-05-27 DIAGNOSIS — J441 Chronic obstructive pulmonary disease with (acute) exacerbation: Secondary | ICD-10-CM | POA: Diagnosis present

## 2019-05-27 DIAGNOSIS — E785 Hyperlipidemia, unspecified: Secondary | ICD-10-CM | POA: Diagnosis present

## 2019-05-27 DIAGNOSIS — D709 Neutropenia, unspecified: Principal | ICD-10-CM | POA: Diagnosis present

## 2019-05-27 DIAGNOSIS — Z950 Presence of cardiac pacemaker: Secondary | ICD-10-CM

## 2019-05-27 DIAGNOSIS — Z880 Allergy status to penicillin: Secondary | ICD-10-CM | POA: Insufficient documentation

## 2019-05-27 DIAGNOSIS — K219 Gastro-esophageal reflux disease without esophagitis: Secondary | ICD-10-CM | POA: Insufficient documentation

## 2019-05-27 DIAGNOSIS — C3482 Malignant neoplasm of overlapping sites of left bronchus and lung: Secondary | ICD-10-CM | POA: Diagnosis not present

## 2019-05-27 DIAGNOSIS — H409 Unspecified glaucoma: Secondary | ICD-10-CM | POA: Diagnosis present

## 2019-05-27 DIAGNOSIS — Z85820 Personal history of malignant melanoma of skin: Secondary | ICD-10-CM

## 2019-05-27 DIAGNOSIS — R600 Localized edema: Secondary | ICD-10-CM | POA: Insufficient documentation

## 2019-05-27 DIAGNOSIS — Z87891 Personal history of nicotine dependence: Secondary | ICD-10-CM

## 2019-05-27 DIAGNOSIS — J449 Chronic obstructive pulmonary disease, unspecified: Secondary | ICD-10-CM | POA: Insufficient documentation

## 2019-05-27 DIAGNOSIS — Z801 Family history of malignant neoplasm of trachea, bronchus and lung: Secondary | ICD-10-CM | POA: Insufficient documentation

## 2019-05-27 DIAGNOSIS — Z7952 Long term (current) use of systemic steroids: Secondary | ICD-10-CM | POA: Insufficient documentation

## 2019-05-27 DIAGNOSIS — Z5111 Encounter for antineoplastic chemotherapy: Secondary | ICD-10-CM | POA: Insufficient documentation

## 2019-05-27 DIAGNOSIS — I442 Atrioventricular block, complete: Secondary | ICD-10-CM | POA: Diagnosis present

## 2019-05-27 DIAGNOSIS — K449 Diaphragmatic hernia without obstruction or gangrene: Secondary | ICD-10-CM | POA: Insufficient documentation

## 2019-05-27 DIAGNOSIS — E871 Hypo-osmolality and hyponatremia: Secondary | ICD-10-CM | POA: Insufficient documentation

## 2019-05-27 DIAGNOSIS — Z596 Low income: Secondary | ICD-10-CM | POA: Insufficient documentation

## 2019-05-27 LAB — CBC WITH DIFFERENTIAL/PLATELET
Abs Immature Granulocytes: 0 10*3/uL (ref 0.00–0.07)
Abs Immature Granulocytes: 0 10*3/uL (ref 0.00–0.07)
Basophils Absolute: 0 10*3/uL (ref 0.0–0.1)
Basophils Absolute: 0 10*3/uL (ref 0.0–0.1)
Basophils Relative: 0 %
Basophils Relative: 2 %
Eosinophils Absolute: 0 10*3/uL (ref 0.0–0.5)
Eosinophils Absolute: 0 10*3/uL (ref 0.0–0.5)
Eosinophils Relative: 0 %
Eosinophils Relative: 0 %
HCT: 28.6 % — ABNORMAL LOW (ref 39.0–52.0)
HCT: 31 % — ABNORMAL LOW (ref 39.0–52.0)
Hemoglobin: 9.8 g/dL — ABNORMAL LOW (ref 13.0–17.0)
Hemoglobin: 10.5 g/dL — ABNORMAL LOW (ref 13.0–17.0)
Immature Granulocytes: 0 %
Immature Granulocytes: 0 %
Lymphocytes Relative: 35 %
Lymphocytes Relative: 43 %
Lymphs Abs: 0.2 10*3/uL — ABNORMAL LOW (ref 0.7–4.0)
Lymphs Abs: 0.3 10*3/uL — ABNORMAL LOW (ref 0.7–4.0)
MCH: 36.3 pg — ABNORMAL HIGH (ref 26.0–34.0)
MCH: 36.3 pg — ABNORMAL HIGH (ref 26.0–34.0)
MCHC: 34.3 g/dL (ref 30.0–36.0)
MCHC: 33.9 g/dL (ref 30.0–36.0)
MCV: 105.9 fL — ABNORMAL HIGH (ref 80.0–100.0)
MCV: 107.3 fL — ABNORMAL HIGH (ref 80.0–100.0)
Monocytes Absolute: 0.3 10*3/uL (ref 0.1–1.0)
Monocytes Absolute: 0.2 10*3/uL (ref 0.1–1.0)
Monocytes Relative: 46 %
Monocytes Relative: 37 %
Neutro Abs: 0.1 10*3/uL — ABNORMAL LOW (ref 1.7–7.7)
Neutro Abs: 0.1 10*3/uL — ABNORMAL LOW (ref 1.7–7.7)
Neutrophils Relative %: 19 %
Neutrophils Relative %: 18 %
Platelets: 50 10*3/uL — ABNORMAL LOW (ref 150–400)
Platelets: 58 10*3/uL — ABNORMAL LOW (ref 150–400)
RBC: 2.7 MIL/uL — ABNORMAL LOW (ref 4.22–5.81)
RBC: 2.89 MIL/uL — ABNORMAL LOW (ref 4.22–5.81)
RDW: 15.9 % — ABNORMAL HIGH (ref 11.5–15.5)
RDW: 15.8 % — ABNORMAL HIGH (ref 11.5–15.5)
Smear Review: DECREASED
WBC: 0.5 10*3/uL — CL (ref 4.0–10.5)
WBC: 0.6 10*3/uL — CL (ref 4.0–10.5)
nRBC: 0 % (ref 0.0–0.2)
nRBC: 0 % (ref 0.0–0.2)

## 2019-05-27 LAB — COMPREHENSIVE METABOLIC PANEL
ALT: 16 U/L (ref 0–44)
AST: 27 U/L (ref 15–41)
Albumin: 3.4 g/dL — ABNORMAL LOW (ref 3.5–5.0)
Alkaline Phosphatase: 74 U/L (ref 38–126)
Anion gap: 9 (ref 5–15)
BUN: 20 mg/dL (ref 8–23)
CO2: 25 mmol/L (ref 22–32)
Calcium: 9.1 mg/dL (ref 8.9–10.3)
Chloride: 100 mmol/L (ref 98–111)
Creatinine, Ser: 1.12 mg/dL (ref 0.61–1.24)
GFR calc Af Amer: >60 mL/min (ref >60)
GFR calc non Af Amer: >60 mL/min (ref >60)
Glucose, Bld: 118 mg/dL — ABNORMAL HIGH (ref 70–99)
Potassium: 3.7 mmol/L (ref 3.5–5.1)
Sodium: 134 mmol/L — ABNORMAL LOW (ref 135–145)
Total Bilirubin: 0.6 mg/dL (ref 0.3–1.2)
Total Protein: 6.8 g/dL (ref 6.5–8.1)

## 2019-05-27 LAB — URINALYSIS, COMPLETE (UACMP) WITH MICROSCOPIC
Bacteria, UA: NONE SEEN
Bilirubin Urine: NEGATIVE
Glucose, UA: NEGATIVE mg/dL
Hgb urine dipstick: NEGATIVE
Ketones, ur: NEGATIVE mg/dL
Leukocytes,Ua: NEGATIVE
Nitrite: NEGATIVE
Protein, ur: NEGATIVE mg/dL
Specific Gravity, Urine: 1.023 (ref 1.005–1.030)
pH: 6 (ref 5.0–8.0)

## 2019-05-27 LAB — LACTIC ACID, PLASMA
Lactic Acid, Venous: 1.2 mmol/L (ref 0.5–1.9)
Lactic Acid, Venous: 1.2 mmol/L (ref 0.5–1.9)
Lactic Acid, Venous: 0.9 mmol/L (ref 0.5–1.9)

## 2019-05-27 LAB — PROTIME-INR
INR: 1 (ref 0.8–1.2)
Prothrombin Time: 13.3 seconds (ref 11.4–15.2)

## 2019-05-27 LAB — COMPREHENSIVE METABOLIC PANEL WITH GFR
ALT: 18 U/L (ref 0–44)
AST: 25 U/L (ref 15–41)
Albumin: 3.2 g/dL — ABNORMAL LOW (ref 3.5–5.0)
Alkaline Phosphatase: 78 U/L (ref 38–126)
Anion gap: 7 (ref 5–15)
BUN: 20 mg/dL (ref 8–23)
CO2: 26 mmol/L (ref 22–32)
Calcium: 9 mg/dL (ref 8.9–10.3)
Chloride: 101 mmol/L (ref 98–111)
Creatinine, Ser: 1.18 mg/dL (ref 0.61–1.24)
GFR calc Af Amer: >60 mL/min (ref >60)
GFR calc non Af Amer: 59 mL/min — ABNORMAL LOW (ref >60)
Glucose, Bld: 119 mg/dL — ABNORMAL HIGH (ref 70–99)
Potassium: 4.1 mmol/L (ref 3.5–5.1)
Sodium: 134 mmol/L — ABNORMAL LOW (ref 135–145)
Total Bilirubin: 0.8 mg/dL (ref 0.3–1.2)
Total Protein: 6.4 g/dL — ABNORMAL LOW (ref 6.5–8.1)

## 2019-05-27 LAB — RESPIRATORY PANEL BY RT PCR (FLU A&B, COVID)
Influenza A by PCR: NEGATIVE
Influenza B by PCR: NEGATIVE
SARS Coronavirus 2 by RT PCR: NEGATIVE

## 2019-05-27 LAB — FIBRIN DERIVATIVES D-DIMER (ARMC ONLY): Fibrin derivatives D-dimer (ARMC): 3554.01 ng{FEU}/mL — ABNORMAL HIGH (ref 0.00–499.00)

## 2019-05-27 LAB — TROPONIN I (HIGH SENSITIVITY): Troponin I (High Sensitivity): 16 ng/L (ref <18)

## 2019-05-27 MED ORDER — FENOFIBRATE 160 MG PO TABS
160.0000 mg | ORAL_TABLET | Freq: Every day | ORAL | Status: DC
  Administered 2019-05-28 – 2019-05-30 (×3): 160 mg via ORAL
  Filled 2019-05-27 (×4): qty 1

## 2019-05-27 MED ORDER — SODIUM CHLORIDE 0.9 % IV SOLN
1.0000 g | Freq: Two times a day (BID) | INTRAVENOUS | Status: DC
  Administered 2019-05-27: 1 g via INTRAVENOUS
  Filled 2019-05-27 (×3): qty 1

## 2019-05-27 MED ORDER — SODIUM CHLORIDE 0.9 % IV SOLN: 1.0000 g | Freq: Three times a day (TID) | INTRAVENOUS | Status: DC

## 2019-05-27 MED ORDER — BUDESON-GLYCOPYRROL-FORMOTEROL 160-9-4.8 MCG/ACT IN AERO: 2.0000 | INHALATION_SPRAY | Freq: Two times a day (BID) | RESPIRATORY_TRACT | Status: DC

## 2019-05-27 MED ORDER — SALINE SPRAY 0.65 % NA SOLN
1.0000 | Freq: Every evening | NASAL | Status: DC | PRN
  Filled 2019-05-27: qty 44

## 2019-05-27 MED ORDER — SODIUM CHLORIDE 0.9% FLUSH: 3.0000 mL | INTRAVENOUS | Status: DC | PRN

## 2019-05-27 MED ORDER — VITAMIN D 25 MCG (1000 UNIT) PO TABS
2000.0000 [IU] | ORAL_TABLET | Freq: Every day | ORAL | Status: DC
  Administered 2019-05-28 – 2019-05-30 (×3): 2000 [IU] via ORAL
  Filled 2019-05-27 (×4): qty 2

## 2019-05-27 MED ORDER — ACETAMINOPHEN 650 MG RE SUPP: 650.0000 mg | Freq: Four times a day (QID) | RECTAL | Status: DC | PRN

## 2019-05-27 MED ORDER — SODIUM CHLORIDE 0.9 % IV SOLN: INTRAVENOUS | Status: DC

## 2019-05-27 MED ORDER — SODIUM CHLORIDE 0.9 % IV BOLUS
2000.0000 mL | Freq: Once | INTRAVENOUS | Status: AC
  Administered 2019-05-27: 2000 mL via INTRAVENOUS

## 2019-05-27 MED ORDER — SODIUM CHLORIDE 0.9 % IV SOLN
2.0000 g | Freq: Once | INTRAVENOUS | Status: AC
  Administered 2019-05-27: 2 g via INTRAVENOUS
  Filled 2019-05-27: qty 2

## 2019-05-27 MED ORDER — ACETAMINOPHEN 325 MG PO TABS: 650.0000 mg | ORAL_TABLET | Freq: Four times a day (QID) | ORAL | Status: DC | PRN

## 2019-05-27 MED ORDER — SODIUM CHLORIDE 0.9 % IV SOLN
250.0000 mL | INTRAVENOUS | Status: DC | PRN
  Administered 2019-05-28 – 2019-05-30 (×4): 250 mL via INTRAVENOUS

## 2019-05-27 MED ORDER — IOHEXOL 350 MG/ML SOLN
75.0000 mL | Freq: Once | INTRAVENOUS | Status: AC | PRN
  Administered 2019-05-27: 75 mL via INTRAVENOUS

## 2019-05-27 MED ORDER — METRONIDAZOLE IN NACL 5-0.79 MG/ML-% IV SOLN
500.0000 mg | Freq: Once | INTRAVENOUS | Status: AC
  Administered 2019-05-27: 500 mg via INTRAVENOUS
  Filled 2019-05-27: qty 100

## 2019-05-27 MED ORDER — MOMETASONE FURO-FORMOTEROL FUM 100-5 MCG/ACT IN AERO
2.0000 | INHALATION_SPRAY | Freq: Two times a day (BID) | RESPIRATORY_TRACT | Status: DC
  Administered 2019-05-27 – 2019-05-30 (×6): 2 via RESPIRATORY_TRACT
  Filled 2019-05-27: qty 8.8

## 2019-05-27 MED ORDER — SODIUM CHLORIDE 0.9 % IV SOLN: 1.0000 g | Freq: Two times a day (BID) | INTRAVENOUS | Status: DC

## 2019-05-27 MED ORDER — FAMOTIDINE 20 MG PO TABS
20.0000 mg | ORAL_TABLET | Freq: Two times a day (BID) | ORAL | Status: DC
  Administered 2019-05-27 – 2019-05-30 (×6): 20 mg via ORAL
  Filled 2019-05-27 (×6): qty 1

## 2019-05-27 MED ORDER — SODIUM CHLORIDE 0.9% FLUSH
3.0000 mL | Freq: Two times a day (BID) | INTRAVENOUS | Status: DC
  Administered 2019-05-28 – 2019-05-29 (×2): 3 mL via INTRAVENOUS

## 2019-05-27 MED ORDER — TRAMADOL HCL 50 MG PO TABS
25.0000 mg | ORAL_TABLET | Freq: Every evening | ORAL | Status: DC | PRN
  Administered 2019-05-28 – 2019-05-29 (×2): 25 mg via ORAL
  Filled 2019-05-27 (×2): qty 1

## 2019-05-27 MED ORDER — ONDANSETRON HCL 4 MG PO TABS: 4.0000 mg | ORAL_TABLET | Freq: Four times a day (QID) | ORAL | Status: DC | PRN

## 2019-05-27 MED ORDER — IPRATROPIUM BROMIDE 0.02 % IN SOLN
0.5000 mg | Freq: Four times a day (QID) | RESPIRATORY_TRACT | Status: DC
  Administered 2019-05-27 – 2019-05-28 (×3): 0.5 mg via RESPIRATORY_TRACT
  Filled 2019-05-27 (×3): qty 2.5

## 2019-05-27 MED ORDER — BRIMONIDINE TARTRATE 0.15 % OP SOLN
1.0000 [drp] | Freq: Two times a day (BID) | OPHTHALMIC | Status: DC
  Administered 2019-05-28 – 2019-05-30 (×5): 1 [drp] via OPHTHALMIC
  Filled 2019-05-27: qty 5

## 2019-05-27 MED ORDER — ONDANSETRON HCL 4 MG/2ML IJ SOLN
4.0000 mg | Freq: Four times a day (QID) | INTRAMUSCULAR | Status: DC | PRN
  Administered 2019-05-28: 4 mg via INTRAVENOUS
  Filled 2019-05-27: qty 2

## 2019-05-27 MED ORDER — FOLIC ACID 1 MG PO TABS
1.0000 mg | ORAL_TABLET | Freq: Every day | ORAL | Status: DC
  Administered 2019-05-28 – 2019-05-30 (×3): 1 mg via ORAL
  Filled 2019-05-27 (×3): qty 1

## 2019-05-27 MED ORDER — SUCRALFATE 1 G PO TABS
1.0000 g | ORAL_TABLET | Freq: Three times a day (TID) | ORAL | Status: DC
  Filled 2019-05-27: qty 1

## 2019-05-27 MED ORDER — VANCOMYCIN HCL IN DEXTROSE 1-5 GM/200ML-% IV SOLN
1000.0000 mg | Freq: Once | INTRAVENOUS | Status: AC
  Administered 2019-05-27: 1000 mg via INTRAVENOUS
  Filled 2019-05-27: qty 200

## 2019-05-27 MED ORDER — GABAPENTIN 100 MG PO CAPS
100.0000 mg | ORAL_CAPSULE | Freq: Three times a day (TID) | ORAL | Status: DC
  Administered 2019-05-27 – 2019-05-30 (×8): 100 mg via ORAL
  Filled 2019-05-27 (×8): qty 1

## 2019-05-27 NOTE — ED Notes (Signed)
Pt placed on 2L [place] for comfort. States his O2 drops and the oxygen helps him feel better.

## 2019-05-27 NOTE — H&P (Signed)
History and Physical    Darryl White TIW:[id number] DOB: 1940/04/02 DOA: 05/27/2019  PCP: Pleas Koch, NP   Patient coming from: Home  I have personally briefly reviewed patient's old medical records in [place]   Chief Complaint: Fever  HPI: Darryl White is a 79 y.o. 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  Last chemotherapy was 3 weeks ago and last radiation therapy was on 05/26/19.  Patient was sent to the emergency room from the oncology clinic due to concerns of neutropenic fever.  He had a temperature of 100.85F and an ANC of 0.1.  He had been started empirically on Levaquin by his oncologist for low-grade fever last week. At the oncologist office patient was noted to be tachycardic with heart rate of 101, pulse oximetry was 87% on room air requiring oxygen supplementation at 2 L. Patient complains of a cough productive of white phlegm, as well as left sided chest pain with radiation to the right side of his chest.  He denies having any nausea, vomiting, diaphoresis or palpitations.  He has no changes to his bowel habits.  Denies having any urinary symptoms, no headache, dizziness or lightheadedness Patient has completed his COVID 74 vaccination series  ED Course: 79 year old Caucasian male with a history of small cell lung cancer currently on chemo and radiation therapy who was referred to the emergency room from the cancer center for evaluation of neutropenic fever.  Labs revealed white cell count of 0.6 and an ANC of 0.1.  Lactate level was within normal limits.  Patient received IV fluids and broad-spectrum antibiotics vancomycin, Azactam and Flagyl in the ER.  He will be admitted to the hospital for further evaluation  Review of Systems: As per HPI otherwise 10 point review of systems negative.     Past Medical History:  Diagnosis Date  . Arthritis 2019   dx of osteoarthritis and rhemautoid arthritis  . Cancer ([place])    lung  . Cataract 2019   left eye  . Chicken pox   . Collagen vascular disease ([place])   . 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 COPD (chronic obstructive pulmonary disease) ([place])   . Diverticulitis   . GERD (gastroesophageal reflux disease)   . 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 History of hiatal hernia   . History of stomach ulcers   . Hyperlipidemia   . Lymphocytic colitis   . Orthostatic hypotension   . Osteoarthritis   . Pancreatitis   . Paroxysmal atrial fibrillation (HCC)   . Presence of permanent cardiac pacemaker   . Small cell lung cancer ([place]) 04/15/2019    Past Surgical History:  Procedure Laterality Date  . APPENDECTOMY    . BACK SURGERY     bone chip lasered  . CARDIAC CATHETERIZATION    . CATARACT EXTRACTION W/ INTRAOCULAR LENS IMPLANT Right   . COLON SURGERY  1990's   12" removed of large intestine  . EYE SURGERY    . INSERT / REPLACE / [place]  . large intestine block    . PACEMAKER INSERTION     MDT dual chamber pacemaker  . PORTA CATH INSERTION N/A 04/17/2019   Procedure: PORTA CATH INSERTION;  Surgeon: Algernon Huxley, MD;  Location: [facility];  Service: Cardiovascular;  Laterality: N/A;  . PPM GENERATOR CHANGEOUT N/A 12/02/2018   Procedure: PPM GENERATOR CHANGEOUT;  Surgeon: Deboraha Sprang, MD;  Location: [facility];  Service: Cardiovascular;  Laterality: N/A;  . TONSILLECTOMY AND ADENOIDECTOMY    . VIDEO BRONCHOSCOPY WITH ENDOBRONCHIAL ULTRASOUND N/A 04/07/2019   Procedure: VIDEO BRONCHOSCOPY WITH ENDOBRONCHIAL ULTRASOUND;  Surgeon: Tyler Pita, MD;  Location: [facility];  Service: Pulmonary;  Laterality: N/A;     reports that he quit smoking about 2 years ago. His smoking use included cigarettes. He has a 60.00 pack-year smoking history. He has never used  smokeless tobacco. He reports current alcohol use of about 5.0 standard drinks of alcohol per week. He reports that he does not use drugs.  Allergies  Allergen Reactions  . Sulfa Antibiotics Hives and Itching  . Penicillins Hives and Palpitations    Did it involve swelling of the face/tongue/throat, SOB, or low BP? No Did it involve sudden or severe rash/hives, skin peeling, or any reaction on the inside of your mouth or nose? No Did you need to seek medical attention at a hospital or doctor's office? Unknown When did it last happen?childhood allergy If all above answers are "NO", may proceed with cephalosporin use.     Family History  Problem Relation Age of Onset  . Lung cancer Mother   . Heart disease Father   . Alzheimer's disease Father      Prior to Admission medications   Medication Sig Start Date End Date Taking? Authorizing Provider  albuterol (PROAIR HFA) 108 (90 Base) MCG/ACT inhaler Inhale 1-2 puffs into the lungs every 6 (six) hours as needed for wheezing or shortness of breath. 10/02/17  Yes Laverle Hobby, MD  antiseptic oral rinse (BIOTENE) LIQD 15 mLs by Mouth Rinse route as needed for dry mouth.   Yes [provider]  brimonidine (ALPHAGAN) 0.15 % ophthalmic solution Place 1 drop into both eyes 2 (two) times daily.    Yes [provider]  Budeson-Glycopyrrol-Formoterol (BREZTRI AEROSPHERE) 160-9-4.8 MCG/ACT AERO Inhale 2 puffs into the lungs 2 (two) times daily. 03/24/19  Yes Tyler Pita, MD  chlorhexidine (PERIDEX) 0.12 % solution Use as directed 15 mLs in the mouth or throat in the morning and at bedtime. 05/06/19  Yes Earlie Server, MD  chlorproMAZINE (THORAZINE) 25 MG tablet Take 1 tablet (25 mg total) by mouth 3 (three) times daily. 04/25/19  Yes Lloyd Huger, MD  Cholecalciferol (VITAMIN D3) 2000 UNITS capsule Take 2,000 Units by mouth daily with lunch.    Yes [provider]  dexamethasone (DECADRON) 4 MG tablet Take 2  tablets (8 mg total) by mouth daily. Start the day after chemotherapy for 1 day. 04/15/19  Yes Earlie Server, MD  dextromethorphan-guaiFENesin [facility] DM) 30-600 MG 12hr tablet Take 1 tablet by mouth 2 (two) times daily as needed (congestion/cough).    Yes [provider]  ELIQUIS 5 MG TABS tablet TAKE 1 TABLET BY MOUTH TWICE DAILY 05/22/19  Yes Deboraha Sprang, MD  famotidine (PEPCID) 20 MG tablet Take 20 mg by mouth 2 (two) times daily.   Yes [provider]  fenofibrate (TRICOR) 145 MG tablet Take 1 tablet (145 mg total) by mouth daily. 02/26/19  Yes Pleas Koch, NP  folic acid (FOLVITE) 1 MG tablet Take 1 mg by mouth daily.   Yes [provider]  furosemide (LASIX) 20 MG tablet Take 1 tablet (20 mg total) by mouth daily. 04/26/19  Yes Lloyd Huger,  MD  gabapentin (NEURONTIN) 100 MG capsule Take 100 mg by mouth 3 (three) times daily.   Yes [provider]  levofloxacin (LEVAQUIN) 500 MG tablet Take 1 tablet (500 mg total) by mouth daily. 05/23/19  Yes Earlie Server, MD  lidocaine-prilocaine (EMLA) cream Apply to affected area once 04/15/19  Yes Earlie Server, MD  methotrexate 2.5 MG tablet Take 12.5 mg by mouth every Sunday.    Yes [provider]  ondansetron (ZOFRAN) 8 MG tablet Take 1 tablet (8 mg total) by mouth 2 (two) times daily as needed for refractory nausea / vomiting. Start on day 3 after carboplatin chemo. 04/15/19  Yes Earlie Server, MD  prochlorperazine (COMPAZINE) 10 MG tablet Take 1 tablet (10 mg total) by mouth every 6 (six) hours as needed (Nausea or vomiting). 04/15/19  Yes Earlie Server, MD  sodium chloride (OCEAN) 0.65 % SOLN nasal spray Place 1 spray into both nostrils at bedtime as needed for congestion.    Yes [provider]  sucralfate (CARAFATE) 1 g tablet Take 1 tablet (1 g total) by mouth 3 (three) times daily. Dissolve in 3-4 tbsp warm water, swish and swallow. 05/20/19  Yes Chrystal, Eulas Post, MD  traMADol (ULTRAM) 50 MG tablet Take 25 mg  by mouth at bedtime as needed for moderate pain.  02/27/18  Yes [provider]    Physical Exam: Vitals:   05/27/19 1148 05/27/19 1150  BP: 118/70   Pulse: 96   Resp: 16   Temp: 99.4 F (37.4 C)   TempSrc: Oral   SpO2: 94%   Weight:  73.5 kg  Height:  5\' 6"  (1.676 m)     Vitals:   05/27/19 1148 05/27/19 1150  BP: 118/70   Pulse: 96   Resp: 16   Temp: 99.4 F (37.4 C)   TempSrc: Oral   SpO2: 94%   Weight:  73.5 kg  Height:  5\' 6"  (1.676 m)    Constitutional: NAD, alert and oriented x 3 Eyes: PERRL, lids and conjunctivae normal ENMT: Mucous membranes are moist.  Neck: normal, supple, no masses, no thyromegaly Respiratory: clear to auscultation bilaterally, no wheezing, no crackles. Normal respiratory effort. No accessory muscle use.  Cardiovascular: Regular rate and rhythm, no murmurs / rubs / gallops. No extremity edema. 2+ pedal pulses. No carotid bruits.  Abdomen: no tenderness, no masses palpated. No hepatosplenomegaly. Bowel sounds positive.  Musculoskeletal: no clubbing / cyanosis. No joint deformity upper and lower extremities.  Skin: no rashes, lesions, ulcers. Scattered ecchymoses on forearms Neurologic: No gross focal neurologic deficit. Psychiatric: Normal mood and affect.   Labs on Admission: I have personally reviewed following labs and imaging studies  CBC: Recent Labs  Lab 05/21/19 0951 05/27/19 1052 05/27/19 1152  WBC 1.6* 0.5* 0.6*  NEUTROABS 1.1* 0.1* 0.1*  HGB 11.7* 9.8* 10.5*  HCT 33.6* 28.6* 31.0*  MCV 105.0* 105.9* 107.3*  PLT 141* 50* 58*   Basic Metabolic Panel: Recent Labs  Lab 05/21/19 0951 05/27/19 1052 05/27/19 1152  NA 132* 134* 134*  K 4.3 4.1 3.7  CL 99 101 100  CO2 25 26 25   GLUCOSE 90 119* 118*  BUN 29* 20 20  CREATININE 1.12 1.18 1.12  CALCIUM 9.2 9.0 9.1   GFR: Estimated Creatinine Clearance: 49.1 mL/min (by C-G formula based on SCr of 1.12 mg/dL). Liver Function Tests: Recent Labs  Lab  05/21/19 0951 05/27/19 1052 05/27/19 1152  AST 29 25 27   ALT 17 18 16   ALKPHOS 83 78  74  BILITOT 1.1 0.8 0.6  PROT 6.7 6.4* 6.8  ALBUMIN 3.5 3.2* 3.4*   No results for input(s): LIPASE, AMYLASE in the last 168 hours. No results for input(s): AMMONIA in the last 168 hours. Coagulation Profile: Recent Labs  Lab 05/27/19 1152  INR 1.0   Cardiac Enzymes: No results for input(s): CKTOTAL, CKMB, CKMBINDEX, TROPONINI in the last 168 hours. BNP (last 3 results) No results for input(s): PROBNP in the last 8760 hours. HbA1C: No results for input(s): HGBA1C in the last 72 hours. CBG: No results for input(s): GLUCAP in the last 168 hours. Lipid Profile: No results for input(s): CHOL, HDL, LDLCALC, TRIG, CHOLHDL, LDLDIRECT in the last 72 hours. Thyroid Function Tests: No results for input(s): TSH, T4TOTAL, FREET4, T3FREE, THYROIDAB in the last 72 hours. Anemia Panel: No results for input(s): VITAMINB12, FOLATE, FERRITIN, TIBC, IRON, RETICCTPCT in the last 72 hours. Urine analysis:    Component Value Date/Time   COLORURINE YELLOW (A) 05/27/2019 1205   APPEARANCEUR HAZY (A) 05/27/2019 1205   LABSPEC 1.023 05/27/2019 1205   PHURINE 6.0 05/27/2019 1205   GLUCOSEU NEGATIVE 05/27/2019 1205   HGBUR NEGATIVE 05/27/2019 1205   BILIRUBINUR NEGATIVE 05/27/2019 1205   KETONESUR NEGATIVE 05/27/2019 1205   PROTEINUR NEGATIVE 05/27/2019 1205   UROBILINOGEN 0.2 10/12/2014 1919   NITRITE NEGATIVE 05/27/2019 1205   LEUKOCYTESUR NEGATIVE 05/27/2019 1205    Radiological Exams on Admission: DG Chest 2 View  Result Date: 05/27/2019 CLINICAL DATA:  Suspected sepsis. EXAM: CHEST - 2 VIEW COMPARISON:  PET-CT 03/20/2019. Chest CT 02/27/2019. Chest x-ray 04/11/2014. FINDINGS: PowerPort catheter noted with tip over the cavoatrial junction. Cardiac pacer stable position. Heart size normal. COPD. Chronic bilateral interstitial prominence noted. Superimposed active pneumonitis cannot be excluded. No focal  alveolar infiltrate. No pleural effusion or pneumothorax. IMPRESSION: 1.  PowerPort catheter noted with tip over SVC. 2.  Cardiac pacer stable position.  Heart size normal. 3. COPD. Chronic interstitial disease. Superimposed active pneumonitis cannot be excluded. No focal alveolar infiltrate noted. Electronically Signed   By: Marcello Moores  Register   On: 05/27/2019 12:21    EKG: Independently reviewed.  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  He verbalizes understanding and agrees with the plan. Code status was discussed and he is a Full code Disposition Plan: Back to previous home environment Consults called: Oncology    Collier Bullock MD [facility]     05/27/2019, 2:25 PM

## 2019-05-27 NOTE — Consult Note (Addendum)
Pharmacy Antibiotic Note  Darryl White is a 79 y.o. male admitted on 05/27/2019 with Febrile Neutropenia.  Pharmacy has been consulted for Meropenem dosing.  PCN allergy: D/w patient and he reports that this was from his childhood in which it caused bradycardia ("low heart rate") and rash. However, patient reports he believes he has had cephalexin and augmentin before and denies any reactions. Therefore, it is likely patient may tolerate medication. D/w with Dr. Francine Graven. Pharmacy asked Darryl White (nurse) to monitor for allergic reactions.   Plan: Meropenem 1g Q12H (CrCl 49.3ml/min)   Height: 5\' 6"  (167.6 cm) Weight: 73.5 kg (162 lb) IBW/kg (Calculated) : 63.8  Temp (24hrs), Avg:99.4 F (37.4 C), Min:99.3 F (37.4 C), Max:99.4 F (37.4 C)  Recent Labs  Lab 05/21/19 0951 05/27/19 1052 05/27/19 1152 05/27/19 1423  WBC 1.6* 0.5* 0.6*  --   CREATININE 1.12 1.18 1.12  --   LATICACIDVEN  --  1.2 1.2 0.9    Estimated Creatinine Clearance: 49.1 mL/min (by C-G formula based on SCr of 1.12 mg/dL).    Allergies  Allergen Reactions  . Sulfa Antibiotics Hives and Itching  . Penicillins Hives and Palpitations    Did it involve swelling of the face/tongue/throat, SOB, or low BP? No Did it involve sudden or severe rash/hives, skin peeling, or any reaction on the inside of your mouth or nose? No Did you need to seek medical attention at a hospital or doctor's office? Unknown When did it last happen?childhood allergy If all above answers are "NO", may proceed with cephalosporin use.     Antimicrobials this admission:   Dose adjustments this admission:   Microbiology results:   Thank you for allowing pharmacy to be a part of this patient's care.  Darryl White 05/27/2019 3:02 PM

## 2019-05-27 NOTE — ED Notes (Signed)
Azactam not available in ED pyxis. Pharmacy notified to send up medication.

## 2019-05-27 NOTE — ED Provider Notes (Signed)
[facility] Emergency Department Provider Note  ____________________________________________   First MD Initiated Contact with Patient 05/27/19 1218     (approximate)  I have reviewed the triage vital signs and the nursing notes.  History  Chief Complaint Fever    HPI Darryl White is a 79 y.o. 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 Patient with temperature 100.4 at home today and ANC on labs of 0.1.   Patient states this morning he had subjective fevers and chills.  Took his temperature noted to be 100.4 Fahrenheit.  He does report some mild increased shortness of breath over the last few days though states he has a history of COPD and emphysema as well.  He also largely attributes his shortness of breath to having to wear a mask (but states he definitely understands the need and is compliant with this).  He has had a productive cough of mild whitish phlegm, which is normal for him in the setting of his COPD.  He denies any chest pain.  No vomiting, diarrhea, dysuria, or malodorous urine.  He denies any sick contacts.  He is not fully vaccinated against COVID.   Past Medical Hx Past Medical History:  Diagnosis Date  . Arthritis 2019   dx of osteoarthritis and rhemautoid arthritis  . Cancer ([place])    lung  . Cataract 2019   left eye  . Chicken pox   . Collagen vascular disease ([place])   . 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 COPD (chronic obstructive pulmonary disease) ([place])   . Diverticulitis   . GERD (gastroesophageal reflux disease)   . 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 History of hiatal hernia   . History of stomach ulcers   . Hyperlipidemia   . Lymphocytic colitis   . Orthostatic hypotension   . Osteoarthritis   . Pancreatitis   . Paroxysmal  atrial fibrillation (HCC)   . Presence of permanent cardiac pacemaker   . Small cell lung cancer ([place]) 04/15/2019    Problem List Patient Active Problem List   Diagnosis Date Noted  . Small cell lung cancer ([place]) 04/15/2019  . Malignant neoplasm of right lung ([place]) 04/11/2019  . Goals of care, counseling/discussion 04/11/2019  . Pulmonary nodules 03/13/2019  . GERD (gastroesophageal reflux disease) 03/13/2019  . Decreased renal function 03/04/2018  . COPD with acute exacerbation ([place]) 02/22/2018  . Preventative health care 03/01/2017  . Diverticulitis 06/19/2016  . Skin cancer (melanoma) ([place]) 06/19/2016  . Change in multiple nevi 05/03/2016  . Personal history of tobacco use, presenting hazards to health 03/17/2016  . Rheumatoid arthritis ([place]) 02/29/2016  . Osteoarthritis 10/07/2015  . Atrial fibrillation ([place]) 10/12/2014  . Hypotension 10/12/2014  . HLD (hyperlipidemia) 01/27/2014  . History of syncope 01/27/2014  . History of colonic polyps 01/27/2014  . Cardiac pacemaker 01/27/2014  . Hypertriglyceridemia 01/27/2014  . Nocturia associated with benign prostatic hypertrophy 01/27/2014  . Tobacco abuse 01/27/2014    Past Surgical Hx Past Surgical History:  Procedure Laterality Date  . APPENDECTOMY    . BACK SURGERY     bone chip lasered  . CARDIAC CATHETERIZATION    . CATARACT EXTRACTION W/ INTRAOCULAR LENS IMPLANT Right   . COLON SURGERY  1990's   12" removed of large intestine  .  EYE SURGERY    . INSERT / REPLACE / [place]  . large intestine block    . PACEMAKER INSERTION     MDT dual chamber pacemaker  . PORTA CATH INSERTION N/A 04/17/2019   Procedure: PORTA CATH INSERTION;  Surgeon: Algernon Huxley, MD;  Location: [facility];  Service: Cardiovascular;  Laterality: N/A;  . PPM GENERATOR CHANGEOUT N/A 12/02/2018   Procedure: PPM GENERATOR CHANGEOUT;  Surgeon: Deboraha Sprang, MD;  Location: [facility];  Service: Cardiovascular;   Laterality: N/A;  . TONSILLECTOMY AND ADENOIDECTOMY    . VIDEO BRONCHOSCOPY WITH ENDOBRONCHIAL ULTRASOUND N/A 04/07/2019   Procedure: VIDEO BRONCHOSCOPY WITH ENDOBRONCHIAL ULTRASOUND;  Surgeon: Tyler Pita, MD;  Location: [facility] OR[facility];  Service: Pulmonary;  Laterality: N/A;    Medications Prior to Admission medications   Medication Sig Start Date End Date Taking? Authorizing Provider  albuterol (PROAIR HFA) 108 (90 Base) MCG/ACT inhaler Inhale 1-2 puffs into the lungs every 6 (six) hours as needed for wheezing or shortness of breath. 10/02/17   Laverle Hobby, MD  antiseptic oral rinse (BIOTENE) LIQD 15 mLs by Mouth Rinse route as needed for dry mouth.    [provider]  brimonidine (ALPHAGAN) 0.15 % ophthalmic solution Place 1 drop into both eyes 2 (two) times daily.     [provider]  Budeson-Glycopyrrol-Formoterol (BREZTRI AEROSPHERE) 160-9-4.8 MCG/ACT AERO Inhale 2 puffs into the lungs 2 (two) times daily. 03/24/19   Tyler Pita, MD  chlorhexidine (PERIDEX) 0.12 % solution Use as directed 15 mLs in the mouth or throat in the morning and at bedtime. 05/06/19   Earlie Server, MD  chlorproMAZINE (THORAZINE) 25 MG tablet Take 1 tablet (25 mg total) by mouth 3 (three) times daily. 04/25/19   Lloyd Huger, MD  Cholecalciferol (VITAMIN D3) 2000 UNITS capsule Take 2,000 Units by mouth daily with lunch.     [provider]  dexamethasone (DECADRON) 4 MG tablet Take 2 tablets (8 mg total) by mouth daily. Start the day after chemotherapy for 1 day. 04/15/19   Earlie Server, MD  dextromethorphan-guaiFENesin [facility] DM) 30-600 MG 12hr tablet Take 1 tablet by mouth 2 (two) times daily as needed (congestion/cough).     [provider]  ELIQUIS 5 MG TABS tablet TAKE 1 TABLET BY MOUTH TWICE DAILY 05/22/19   Deboraha Sprang, MD  famotidine (PEPCID) 20 MG tablet Take 20 mg by mouth 2 (two) times daily.    [provider]  fenofibrate (TRICOR) 145 MG tablet  Take 1 tablet (145 mg total) by mouth daily. 02/26/19   Pleas Koch, NP  folic acid (FOLVITE) 1 MG tablet Take 1 mg by mouth daily.    [provider]  furosemide (LASIX) 20 MG tablet Take 1 tablet (20 mg total) by mouth daily. 04/26/19   Lloyd Huger, MD  gabapentin (NEURONTIN) 100 MG capsule Take 100 mg by mouth 3 (three) times daily.    [provider]  levofloxacin (LEVAQUIN) 500 MG tablet Take 1 tablet (500 mg total) by mouth daily. 05/23/19   Earlie Server, MD  lidocaine-prilocaine (EMLA) cream Apply to affected area once 04/15/19   Earlie Server, MD  methotrexate 2.5 MG tablet Take 12.5 mg by mouth every Sunday.     [provider]  ondansetron (ZOFRAN) 8 MG tablet Take 1 tablet (8 mg total) by mouth 2 (two) times daily as needed for refractory nausea / vomiting. Start on day 3  after carboplatin chemo. 04/15/19   Earlie Server, MD  prochlorperazine (COMPAZINE) 10 MG tablet Take 1 tablet (10 mg total) by mouth every 6 (six) hours as needed (Nausea or vomiting). 04/15/19   Earlie Server, MD  sodium chloride (OCEAN) 0.65 % SOLN nasal spray Place 1 spray into both nostrils at bedtime as needed for congestion.     [provider]  sucralfate (CARAFATE) 1 g tablet Take 1 tablet (1 g total) by mouth 3 (three) times daily. Dissolve in 3-4 tbsp warm water, swish and swallow. 05/20/19   Noreene Filbert, MD  traMADol (ULTRAM) 50 MG tablet Take 25 mg by mouth at bedtime as needed for moderate pain.  02/27/18   [provider]    Allergies Sulfa antibiotics and Penicillins  Family Hx Family History  Problem Relation Age of Onset  . Lung cancer Mother   . Heart disease Father   . Alzheimer's disease Father     Social Hx Social History   Tobacco Use  . Smoking status: Former Smoker    Packs/day: 1.00    Years: 60.00    Pack years: 60.00    Types: Cigarettes    Quit date: 04/29/2017    Years since quitting: 2.0  . Smokeless tobacco: Never Used  Substance Use  Topics  . Alcohol use: Yes    Alcohol/week: 5.0 standard drinks    Types: 5 Glasses of wine per week  . Drug use: No     Review of Systems  Constitutional: Positive for fevers, chills. Eyes: Negative for visual changes. ENT: Negative for sore throat. Cardiovascular: Negative for chest pain. Respiratory: Positive for shortness of breath, cough. Gastrointestinal: Negative for nausea. Negative for vomiting.  Genitourinary: Negative for dysuria. Musculoskeletal: Negative for leg swelling. Skin: Negative for rash. Neurological: Negative for headaches.   Physical Exam  Vital Signs: ED Triage Vitals  Enc Vitals Group     BP 05/27/19 1148 118/70     Pulse Rate 05/27/19 1148 96     Resp 05/27/19 1148 16     Temp 05/27/19 1148 99.4 F (37.4 C)     Temp Source 05/27/19 1148 Oral     SpO2 05/27/19 1148 94 %     Weight 05/27/19 1150 162 lb (73.5 kg)     Height 05/27/19 1150 5\' 6"  (1.676 m)     Head Circumference --      Peak Flow --      Pain Score 05/27/19 1148 6     Pain Loc --      Pain Edu? --      Excl. in [place]? --     Constitutional: Alert and oriented. Well appearing. NAD.  Head: Normocephalic. Atraumatic. Eyes: Conjunctivae clear. Sclera anicteric. Pupils equal and symmetric. Nose: No masses or lesions. No congestion or rhinorrhea. Mouth/Throat: Wearing mask.  Neck: No stridor. Trachea midline.  Cardiovascular: Normal rate, regular rhythm. Extremities well perfused. Respiratory: Normal respiratory effort.  Decreased, but present breath sounds bilaterally.  Lungs otherwise clear. Gastrointestinal: Soft. Non-distended. Non-tender.  Genitourinary: Deferred. Musculoskeletal: No lower extremity edema. No deformities. Neurologic:  Normal speech and language. No gross focal or lateralizing neurologic deficits are appreciated.  Skin: Skin is warm, dry and intact. No rash noted. Psychiatric: Mood and affect are appropriate for  situation.  EKG  N/A   Radiology  Personally reviewed available imaging myself.   CXR  IMPRESSION:  1. PowerPort catheter noted with tip over SVC.  2. Cardiac pacer stable position. Heart size normal.  3. COPD. Chronic interstitial disease. Superimposed active pneumonitis cannot be excluded. No focal alveolar infiltrate noted.     Procedures  Procedure(s) performed (including critical care):  .Critical Care Performed by: Lilia Pro., MD Authorized by: Lilia Pro., MD   Critical care provider statement:    Critical care time (minutes):  35   Critical care was time spent personally by me on the following activities:  Discussions with consultants, evaluation of patient's response to treatment, examination of patient, ordering and performing treatments and interventions, ordering and review of laboratory studies, ordering and review of radiographic studies, pulse oximetry, re-evaluation of patient's condition, obtaining history from patient or surrogate and review of old charts     Initial Impression / Assessment and Plan / MDM / ED Course  79 y.o. male who presents to the ED, hx of small cell lung cancer on chemo/radiation, who presents from clinic w/ concern for neutropenic fever.  Ddx: neutropenic fever, sepsis 2/2 unclear etiology at this time  Will plan for labs, including cultures, empiric antibiotics, COVID testing.   Clinical Course as of May 27 2151  Tue May 27, 2019  1255 Repeat labs here confirm leukopenia and neutropenia, WBC count 0.6 and ANC 0.1.  Fluids and broad-spectrum antibiotics ordered. Initial lactic reassuringly normal. Will admit.  Hemoglobin at baseline.  Thrombocytopenia noted - per oncology, suspected 2/2 concurrent chemo and rads.   [SM]    Clinical Course User Index [SM] Lilia Pro., MD     _______________________________   As part of my medical decision making I have reviewed available labs, radiology tests, reviewed  old records/chart review.    Final Clinical Impression(s) / ED Diagnosis  Final diagnoses:  Neutropenic fever ([place])       Note:  This document was prepared using Dragon voice recognition software and may include unintentional dictation errors.   Lilia Pro., MD 05/27/19 2153

## 2019-05-27 NOTE — Progress Notes (Signed)
Hematology/Oncology follow up note [facility] Telephone:([phone] [phone]   Patient Care Team: Pleas Koch, NP as PCP - General (Internal Medicine) Deboraha Sprang, MD as Consulting Physician (Cardiology) Marlowe Sax, MD as Referring Physician (Internal Medicine) Birder Robson, MD as Referring Physician (Ophthalmology) Oneta Rack, MD as Referring Physician (Dermatology) Salli Real, DDS as Referring Physician (Dentistry) Telford Nab, RN as Oncology Nurse Navigator Earlie Server, MD as Consulting Physician (Hematology and Oncology)  REFERRING PROVIDER: Pleas Koch, NP  CHIEF COMPLAINTS/REASON FOR VISIT:  Follow-up for management of lung cancer  HISTORY OF PRESENTING ILLNESS:   Darryl White is a  79 y.o.  male with PMH listed below was seen in consultation at the request of  Pleas Koch, NP  for evaluation of lung mass April 2020 patient was evaluated by radiation oncology Dr. Baruch Gouty for right lung nodules concerning for progressive malignancy.   Patient had progressively enlarging media right lower lobe which was previously treated in April 2020 empirically with SBRT.  Biopsy was not done due to poor lung function secondary to COPD/moderate fibrosis. Patient also had a right upper lobe nodule being closely watched, and was found to have progressively enlarged on 10/29/2018 CT chest. 02/27/2019 patient had another CT done which showed marked progression of precarinal lymph node and a continued progression of peripheral right upper lobe pulmonary nodule. 03/20/2019 PET scan showed hypermetabolic right lower paratracheal and subcarinal adenopathy with hypermetabolic right upper lobe subpleural nodule, or significantly worsened compared to prior CT scan. Patient also had 1.2 x 1 cm left upper lobe confluent groundglass densities with low metabolic activity. Patient was referred to establish care with oncology  for further evaluation and management.  Patient reports chronic shortness of breath with exertion.  He follows up with pulmonology and was recently seen by Dr. Patsey Berthold.  Denies any hemoptysis,.  He has a history of extensive smoking history, 67-pack-year, quit in 2019. He lives with wife at home. -CT head is negative. MRI is contraindicated due to pace marker.   # PET scan images were reviewed and discussed with patient. Also discussed with patient about pathology report Pre-carinal lymph node EBUS guided FNA showed malignancy, metastatic high-grade carcinoma. 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  There are patchy areas with focal weak staining for CD56-which is a neuroendocrine marker.  However many clusters of tumor cells are negative.  While the morphology alone would support a diagnosis of small cell carcinoma, there is insufficient immunohistochemistry evidence to support any specific tumor differentiation. I discussed the possibility of small cell lung cancer which is more aggressive in nature and is related with worse outcome. Regimen with concurrent radiation and chemotherapy with carboplatin and taxol would not be sufficient for small cell lung cancer treatment.We will treat the benefit of doubt using small cell lung cancer regimen, he agrees with the plan. I confirmed with Dr. 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  # Patient developed multiple epistaxis episodes during the interval.  Some episodes takes about 20 to 30 minutes to stop.  Patient called RN navigator and I advised patient to stop Eliquis.  Patient has been on Eliquis 5 mg twice daily due to cardiology diseases.  Patient has contacted cardiology and [name] recommend  patient to hold off  Eliquis for now INTERVAL HISTORY Darryl White is a 79 y.o. male who has above history reviewed by me today presents for follow up acute visit for fever, chest pressure, currently on chemotherapy radiation for lung cancer-high-grade carcinoma,  Problems and complaints are listed below: Patient is currently on concurrent chemotherapy radiation for high-grade carcinoma of the lung. Patient had called on 05/23/2019 and reported that his temperature was 100.5, which decreased to 99.5 after taking Tylenol.  His blood work at that time showed [employer] of 1.1.  Patient was otherwise doing well.  Patient was started on prophylactic Levaquin as it is anticipated that he is immune system may further decrease. Patient started Levaquin and his temperature was normal during the last weekend.  Yesterday he had another fever episode of 100.4, took Tylenol.  This morning patient is checking for daily radiation and reported that he has experienced chest pressure, radiating to the back as well as abdomen abdomen, he is mildly tachycardic with heart rate of 101, blood pressure 122/67.  Pulse ox 87% on room air.  Patient was started on 2 L of oxygen and he feels the chest pressure has improved.   He coughs up whitish sputum.  Denies any diarrhea nausea vomiting. Denies any history of heart attack.  History of paroxysmal atrial fibrillation and was previously on Eliquis.  He has developed episodes of epistaxis with some thrombocytopenia.  [name] is okay with him temporarily off Eliquis due to bleeding tendency.  Review of Systems  Constitutional: Positive for chills and fatigue. Negative for fever.  HENT:   Negative for hearing loss, nosebleeds and voice change.   Eyes: Negative for eye problems and icterus.  Respiratory: Positive for chest tightness, cough and shortness of breath.   Cardiovascular: Negative for chest pain and leg swelling.  Gastrointestinal: Negative for abdominal distention and  abdominal pain.  Endocrine: Negative for hot flashes.  Genitourinary: Negative for difficulty urinating, dysuria and frequency.   Musculoskeletal: Negative for arthralgias.  Skin: Negative for itching and rash.  Neurological: Negative for light-headedness and numbness.  Hematological: Negative for adenopathy. Does not bruise/bleed easily.  Psychiatric/Behavioral: Negative for confusion.    MEDICAL HISTORY:  Past Medical History:  Diagnosis Date  . Arthritis 2019   dx of osteoarthritis and rhemautoid arthritis  . Cancer ([place])    lung  . Cataract 2019   left eye  . Chicken pox   . Collagen vascular disease ([place])   . 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 COPD (chronic obstructive pulmonary disease) ([place])   . Diverticulitis   . GERD (gastroesophageal reflux disease)   . 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 History of hiatal hernia   . History of stomach ulcers   . Hyperlipidemia   . Lymphocytic colitis   . Orthostatic hypotension   . Osteoarthritis   . Pancreatitis   . Paroxysmal atrial fibrillation (HCC)   . Presence of permanent cardiac pacemaker   . 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 APPENDECTOMY    . BACK SURGERY     bone chip lasered  . CARDIAC CATHETERIZATION    . CATARACT EXTRACTION W/ INTRAOCULAR LENS IMPLANT Right   . COLON SURGERY  1990's   12" removed of large intestine  . EYE SURGERY    . INSERT / REPLACE / [place]  . large intestine block    .  PACEMAKER INSERTION     MDT dual chamber pacemaker  . PORTA CATH INSERTION N/A 04/17/2019   Procedure: PORTA CATH INSERTION;  Surgeon: Algernon Huxley, MD;  Location: [facility];  Service: Cardiovascular;  Laterality: N/A;  . PPM GENERATOR CHANGEOUT N/A 12/02/2018   Procedure: PPM GENERATOR CHANGEOUT;  Surgeon: Deboraha Sprang, MD;  Location: [facility];  Service: Cardiovascular;   Laterality: N/A;  . TONSILLECTOMY AND ADENOIDECTOMY    . VIDEO BRONCHOSCOPY WITH ENDOBRONCHIAL ULTRASOUND N/A 04/07/2019   Procedure: VIDEO BRONCHOSCOPY WITH ENDOBRONCHIAL ULTRASOUND;  Surgeon: Tyler Pita, MD;  Location: [facility];  Service: Pulmonary;  Laterality: N/A;    SOCIAL HISTORY: Social History   Socioeconomic History  . Marital status: Married    Spouse name: Not on file  . Number of children: 2  . Years of education: Not on file  . Highest education level: Not on file  Occupational History  . Occupation: Retired  Tobacco Use  . Smoking status: Former Smoker    Packs/day: 1.00    Years: 60.00    Pack years: 60.00    Types: Cigarettes    Quit date: 04/29/2017    Years since quitting: 2.0  . Smokeless tobacco: Never Used  Substance and Sexual Activity  . Alcohol use: Yes    Alcohol/week: 5.0 standard drinks    Types: 5 Glasses of wine per week  . Drug use: No  . Sexual activity: Never  Other Topics Concern  . Not on file  Social History Narrative   Married.   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      Does not have a living will.   Desires CPR, does not want prolonged life support if futile.   Social Determinants of Health   Financial Resource Strain: Low Risk   . Difficulty of Paying Living Expenses: Not hard at all  Food Insecurity: No Food Insecurity  . Worried About Charity fundraiser in the Last Year: Never true  . Ran Out of Food in the Last Year: Never true  Transportation Needs: No Transportation Needs  . Lack of Transportation (Medical): No  . Lack of Transportation (Non-Medical): No  Physical Activity: Sufficiently Active  . Days of Exercise per Week: 7 days  . Minutes of Exercise per Session: 60 min  Stress: No Stress Concern Present  . Feeling of Stress : Not at all  Social Connections:   . Frequency of Communication with Friends and Family:   . Frequency of Social Gatherings with Friends and Family:   . Attends  Religious Services:   . Active Member of Clubs or Organizations:   . Attends Archivist Meetings:   Marland Kitchen Marital Status:   Intimate Partner Violence: Not At Risk  . Fear of Current or Ex-Partner: No  . Emotionally Abused: No  . Physically Abused: No  . Sexually Abused: No    FAMILY HISTORY: Family History  Problem Relation Age of Onset  . Lung cancer Mother   . Heart disease Father   . Alzheimer's disease Father     ALLERGIES:  is allergic to sulfa antibiotics and penicillins.  MEDICATIONS:  Current Outpatient Medications  Medication Sig Dispense Refill  . albuterol (PROAIR HFA) 108 (90 Base) MCG/ACT inhaler Inhale 1-2 puffs into the lungs every 6 (six) hours as needed for wheezing or shortness of breath. 1 Inhaler 5  . antiseptic oral rinse (BIOTENE) LIQD 15 mLs by Mouth Rinse route as needed for dry  mouth.    . brimonidine (ALPHAGAN) 0.15 % ophthalmic solution Place 1 drop into both eyes 2 (two) times daily.     . Budeson-Glycopyrrol-Formoterol (BREZTRI AEROSPHERE) 160-9-4.8 MCG/ACT AERO Inhale 2 puffs into the lungs 2 (two) times daily. 10.7 g 6  . chlorhexidine (PERIDEX) 0.12 % solution Use as directed 15 mLs in the mouth or throat in the morning and at bedtime. 473 mL 1  . chlorproMAZINE (THORAZINE) 25 MG tablet Take 1 tablet (25 mg total) by mouth 3 (three) times daily. 30 tablet 0  . Cholecalciferol (VITAMIN D3) 2000 UNITS capsule Take 2,000 Units by mouth daily with lunch.     . dexamethasone (DECADRON) 4 MG tablet Take 2 tablets (8 mg total) by mouth daily. Start the day after chemotherapy for 1 day. 30 tablet 1  . dextromethorphan-guaiFENesin (MUCINEX DM) 30-600 MG 12hr tablet Take 1 tablet by mouth 2 (two) times daily as needed (congestion/cough).     Marland Kitchen ELIQUIS 5 MG TABS tablet TAKE 1 TABLET BY MOUTH TWICE DAILY 60 tablet 6  . famotidine (PEPCID) 20 MG tablet Take 20 mg by mouth 2 (two) times daily.    . fenofibrate (TRICOR) 145 MG tablet Take 1 tablet (145 mg  total) by mouth daily. 90 tablet 1  . folic acid (FOLVITE) 1 MG tablet Take 1 mg by mouth daily.    . furosemide (LASIX) 20 MG tablet Take 1 tablet (20 mg total) by mouth daily. 10 tablet 0  . gabapentin (NEURONTIN) 100 MG capsule Take 100 mg by mouth 3 (three) times daily.    Marland Kitchen levofloxacin (LEVAQUIN) 500 MG tablet Take 1 tablet (500 mg total) by mouth daily. 7 tablet 0  . lidocaine-prilocaine (EMLA) cream Apply to affected area once 30 g 3  . methotrexate 2.5 MG tablet Take 12.5 mg by mouth every Sunday.     . ondansetron (ZOFRAN) 8 MG tablet Take 1 tablet (8 mg total) by mouth 2 (two) times daily as needed for refractory nausea / vomiting. Start on day 3 after carboplatin chemo. 30 tablet 1  . prochlorperazine (COMPAZINE) 10 MG tablet Take 1 tablet (10 mg total) by mouth every 6 (six) hours as needed (Nausea or vomiting). 30 tablet 1  . sodium chloride (OCEAN) 0.65 % SOLN nasal spray Place 1 spray into both nostrils at bedtime as needed for congestion.     . sucralfate (CARAFATE) 1 g tablet Take 1 tablet (1 g total) by mouth 3 (three) times daily. Dissolve in 3-4 tbsp warm water, swish and swallow. 90 tablet 3  . traMADol (ULTRAM) 50 MG tablet Take 25 mg by mouth at bedtime as needed for moderate pain.      No current facility-administered medications for this visit.     PHYSICAL EXAMINATION: ECOG PERFORMANCE STATUS: 1 - Symptomatic but completely ambulatory Vitals:   05/27/19 1041  BP: 128/71  Pulse: (!) 101  Resp: (!) 22  Temp: 99.3 F (37.4 C)  SpO2: (!) 87%   Filed Weights    Physical Exam Vitals reviewed.  Constitutional:      General: He is not in acute distress.    Comments: Patient walks independently  HENT:     Head: Normocephalic and atraumatic.  Eyes:     General: No scleral icterus. Cardiovascular:     Rate and Rhythm: Normal rate and regular rhythm.     Heart sounds: Normal heart sounds.  Pulmonary:     Effort: Pulmonary effort is normal. No respiratory  distress.  Breath sounds: Examination of the right-lower field reveals rhonchi. Examination of the left-lower field reveals rhonchi. Rhonchi present.     Comments: Severely decreased breath sound bilaterally. Abdominal:     General: Bowel sounds are normal. There is no distension.     Palpations: Abdomen is soft.  Musculoskeletal:        General: No deformity. Normal range of motion.     Cervical back: Normal range of motion and neck supple.  Skin:    General: Skin is warm and dry.     Findings: No erythema or rash.  Neurological:     Mental Status: He is alert and oriented to person, place, and time. Mental status is at baseline.     Cranial Nerves: No cranial nerve deficit.     Coordination: Coordination normal.  Psychiatric:        Mood and Affect: Mood normal.     LABORATORY DATA:  I have reviewed the data as listed Lab Results  Component Value Date   WBC 0.5 (LL) 05/27/2019   HGB 9.8 (L) 05/27/2019   HCT 28.6 (L) 05/27/2019   MCV 105.9 (H) 05/27/2019   PLT 50 (L) 05/27/2019   Recent Labs    05/14/19 0827 05/21/19 0951 05/27/19 1052  NA 133* 132* 134*  K 4.0 4.3 4.1  CL 104 99 101  CO2 23 25 26   GLUCOSE 111* 90 119*  BUN 14 29* 20  CREATININE 1.03 1.12 1.18  CALCIUM 8.8* 9.2 9.0  GFRNONAA >60 >60 59*  GFRAA >60 >60 >60  PROT 6.3* 6.7 6.4*  ALBUMIN 3.7 3.5 3.2*  AST 30 29 25   ALT 19 17 18   ALKPHOS 77 83 78  BILITOT 0.5 1.1 0.8   Iron/TIBC/Ferritin/ %Sat No results found for: IRON, TIBC, FERRITIN, IRONPCTSAT    RADIOGRAPHIC STUDIES: I have personally reviewed the radiological images as listed and agreed with the findings in the report.  No results found.    ASSESSMENT & PLAN:  1. Small cell lung cancer ([place])   2. Shortness of breath   3. Febrile neutropenia (HCC)   4. Thrombocytopenia ([place])    #Right lung high-grade poorly differentiated carcinoma-suspect small cell lung cancer.  Clinically T1c N2 M0.  Currently on concurrent chemotherapy  and radiation.  #Fever episodes, cough, shortness of breath.  Chest pressure Check stat CBC and CMP D-dimer, lactic acid, troponin. CBC came back that patient has neutropenia with ANC of 0.1. Neutropenic fever, advised patient to go to emergency room for further evaluation and start on broad-spectrum IV antibiotics.  D-dimer, lactic acid, troponins are pending. Called ER triage and provided information.  Hold off radiation today.  #Thrombocytopenia, platelet count dropped to 50,000.  He is off Eliquis for paroxysmal atrial fibrillation. No bleeding events.  Continue to monitor. Thrombocytopenia is likely secondary to concurrent chemotherapy and radiation.   All questions were answered. The patient knows to call the clinic with any problems questions or concerns. Follow up appointment: TBD    Earlie Server, MD, PhD Hematology Oncology [facility] at [facility] Pager- [phone] 05/27/2019

## 2019-05-27 NOTE — ED Notes (Signed)
Pt assisted to use urinal. Increased work of breathing and respiration rate noted with exertion.

## 2019-05-27 NOTE — Consult Note (Signed)
PHARMACY -  BRIEF ANTIBIOTIC NOTE   Pharmacy has received consult(s) for Vancomycin and Aztreonam from an ED provider.  The patient's profile has been reviewed for ht/wt/allergies/indication/available labs.    One time order(s) placed for Vancomycin 1gx1 and aztreonam 2g x1.   Further antibiotics/pharmacy consults should be ordered by admitting physician if indicated.                       Thank you, [name] 05/27/2019  1:00 PM

## 2019-05-27 NOTE — ED Triage Notes (Addendum)
Pt arrives to ED from [place] center. Was supposed to receive radiation today but sent to ER for fever. 100.4 at [facility] center. Pt states fever x 1 week. Also states "my white cells have bottomed out." states currently taking antibiotics for a possible infection (levaquin since last week). A&O, ambulatory.

## 2019-05-28 ENCOUNTER — Ambulatory Visit: Payer: Medicare PPO

## 2019-05-28 ENCOUNTER — Inpatient Hospital Stay: Payer: Medicare PPO

## 2019-05-28 DIAGNOSIS — C3490 Malignant neoplasm of unspecified part of unspecified bronchus or lung: Secondary | ICD-10-CM

## 2019-05-28 DIAGNOSIS — D709 Neutropenia, unspecified: Principal | ICD-10-CM

## 2019-05-28 DIAGNOSIS — E440 Moderate protein-calorie malnutrition: Secondary | ICD-10-CM | POA: Insufficient documentation

## 2019-05-28 DIAGNOSIS — D649 Anemia, unspecified: Secondary | ICD-10-CM

## 2019-05-28 DIAGNOSIS — D696 Thrombocytopenia, unspecified: Secondary | ICD-10-CM

## 2019-05-28 DIAGNOSIS — R5081 Fever presenting with conditions classified elsewhere: Secondary | ICD-10-CM

## 2019-05-28 LAB — BASIC METABOLIC PANEL
Anion gap: 5 (ref 5–15)
BUN: 14 mg/dL (ref 8–23)
CO2: 22 mmol/L (ref 22–32)
Calcium: 7.9 mg/dL — ABNORMAL LOW (ref 8.9–10.3)
Chloride: 105 mmol/L (ref 98–111)
Creatinine, Ser: 0.84 mg/dL (ref 0.61–1.24)
GFR calc Af Amer: >60 mL/min (ref >60)
GFR calc non Af Amer: >60 mL/min (ref >60)
Glucose, Bld: 105 mg/dL — ABNORMAL HIGH (ref 70–99)
Potassium: 3.3 mmol/L — ABNORMAL LOW (ref 3.5–5.1)
Sodium: 132 mmol/L — ABNORMAL LOW (ref 135–145)

## 2019-05-28 LAB — CBC
HCT: 24.4 % — ABNORMAL LOW (ref 39.0–52.0)
Hemoglobin: 8.3 g/dL — ABNORMAL LOW (ref 13.0–17.0)
MCH: 36.4 pg — ABNORMAL HIGH (ref 26.0–34.0)
MCHC: 34 g/dL (ref 30.0–36.0)
MCV: 107 fL — ABNORMAL HIGH (ref 80.0–100.0)
Platelets: 47 10*3/uL — ABNORMAL LOW (ref 150–400)
RBC: 2.28 MIL/uL — ABNORMAL LOW (ref 4.22–5.81)
RDW: 15.9 % — ABNORMAL HIGH (ref 11.5–15.5)
WBC: 0.7 10*3/uL — CL (ref 4.0–10.5)
nRBC: 0 % (ref 0.0–0.2)

## 2019-05-28 LAB — PROCALCITONIN: Procalcitonin: <0.1 ng/mL

## 2019-05-28 MED ORDER — IPRATROPIUM BROMIDE 0.02 % IN SOLN: 0.5000 mg | Freq: Four times a day (QID) | RESPIRATORY_TRACT | Status: DC | PRN

## 2019-05-28 MED ORDER — ENSURE ENLIVE PO LIQD
237.0000 mL | Freq: Three times a day (TID) | ORAL | Status: DC
  Administered 2019-05-28 – 2019-05-30 (×4): 237 mL via ORAL

## 2019-05-28 MED ORDER — POTASSIUM CHLORIDE CRYS ER 20 MEQ PO TBCR
40.0000 meq | EXTENDED_RELEASE_TABLET | Freq: Once | ORAL | Status: AC
  Administered 2019-05-28: 40 meq via ORAL
  Filled 2019-05-28: qty 2

## 2019-05-28 MED ORDER — SODIUM CHLORIDE 0.9 % IV SOLN
1.0000 g | Freq: Three times a day (TID) | INTRAVENOUS | Status: DC
  Administered 2019-05-28 – 2019-05-29 (×4): 1 g via INTRAVENOUS
  Filled 2019-05-28 (×6): qty 1

## 2019-05-28 MED ORDER — CHLORHEXIDINE GLUCONATE 0.12 % MT SOLN
15.0000 mL | Freq: Two times a day (BID) | OROMUCOSAL | Status: DC
  Administered 2019-05-28 – 2019-05-30 (×5): 15 mL via OROMUCOSAL
  Filled 2019-05-28 (×5): qty 15

## 2019-05-28 MED ORDER — IOHEXOL 9 MG/ML PO SOLN
500.0000 mL | ORAL | Status: AC
  Administered 2019-05-28 (×2): 500 mL via ORAL

## 2019-05-28 MED ORDER — IOHEXOL 300 MG/ML  SOLN
100.0000 mL | Freq: Once | INTRAMUSCULAR | Status: AC | PRN
  Administered 2019-05-28: 100 mL via INTRAVENOUS

## 2019-05-28 MED ORDER — ADULT MULTIVITAMIN W/MINERALS CH
1.0000 | ORAL_TABLET | Freq: Every day | ORAL | Status: DC
  Administered 2019-05-29 – 2019-05-30 (×2): 1 via ORAL
  Filled 2019-05-28 (×2): qty 1

## 2019-05-28 NOTE — Consult Note (Addendum)
Hematology/Oncology Consult note [facility] Telephone:([phone] [phone]  Patient Care Team: Pleas Koch, NP as PCP - General (Internal Medicine) Deboraha Sprang, MD as Consulting Physician (Cardiology) Marlowe Sax, MD as Referring Physician (Internal Medicine) Birder Robson, MD as Referring Physician (Ophthalmology) Oneta Rack, MD as Referring Physician (Dermatology) Salli Real, DDS as Referring Physician (Dentistry) Telford Nab, RN as Oncology Nurse Navigator Earlie Server, MD as Consulting Physician (Hematology and Oncology)   Name of the patient: Darryl White  [id number]  Aug 20, 1940   Date of visit: 05/28/19 REASON FOR COSULTATION:  Pancytopenia/neutropenic fever History of presenting illness-  79 y.o. male with PMH listed at below who was sent to emergency room per my recommendation for evaluation of neutropenic fever.  Patient was seen yesterday by me in the clinic.  Please refer to detailed history documented in my 05/27/2019 office note. Patient has history of high-grade carcinoma of the lung currently undergoing concurrent chemotherapy and radiation. He was found to be neutropenic and also have temperature 100.4 at home.  Yesterday he was also found to be hypoxic 87% on room air in the clinic.  ANC was checked and was 0.1. Lactate was checked and was within normal limits.  Patient was started on broad-spectrum antibiotics with vancomycin, Flagyl, aztreonam.  Heme-onc was consulted for pancytopenia/neutropenic fever Patient has been afebrile since admission. D-dimer was significantly elevated.  CT chest PE protocol was obtained which showed no acute intrathoracic pathology.  No PE.  Significant interval decrease in the size of the previously seen mediastinal lymph nodes.  No new lymphadenopathy.  Near complete resolution of the previously seen right upper lobe hypermetabolic subpleural nodule. There is thickened  appearance of the esophagus concerning for esophagitis. Partially visualized area of the colonic thickening and pericolonic stranding in the right upper abdomen.  Concerning for diverticulitis.  Denies any nausea, vomiting, chest pain, abdominal pain.  Denies any diarrhea. He ate a good breakfast this morning.  Chest tightness has improved.  Review of Systems  Constitutional: Positive for appetite change and fatigue. Negative for chills, fever and unexpected weight change.  HENT:   Negative for hearing loss and voice change.   Eyes: Negative for eye problems and icterus.  Respiratory: Positive for shortness of breath. Negative for chest tightness and cough.   Cardiovascular: Negative for chest pain and leg swelling.  Gastrointestinal: Negative for abdominal distention and abdominal pain.  Endocrine: Negative for hot flashes.  Genitourinary: Negative for difficulty urinating, dysuria and frequency.   Musculoskeletal: Negative for arthralgias.  Skin: Negative for itching and rash.  Neurological: Negative for light-headedness and numbness.  Hematological: Negative for adenopathy. Does not bruise/bleed easily.  Psychiatric/Behavioral: Negative for confusion.    Allergies  Allergen Reactions  . Sulfa Antibiotics Hives and Itching  . Penicillins Hives and Palpitations    Did it involve swelling of the face/tongue/throat, SOB, or low BP? No Did it involve sudden or severe rash/hives, skin peeling, or any reaction on the inside of your mouth or nose? No Did you need to seek medical attention at a hospital or doctor's office? Unknown When did it last happen?childhood allergy If all above answers are "NO", may proceed with cephalosporin use.     Patient Active Problem List   Diagnosis Date Noted  . Neutropenic fever ([place]) 05/27/2019  . 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 Malignant neoplasm of right lung ([place]) 04/11/2019  . Goals of care,  counseling/discussion 04/11/2019  .  Pulmonary nodules 03/13/2019  . GERD (gastroesophageal reflux disease) 03/13/2019  . Decreased renal function 03/04/2018  . COPD with acute exacerbation ([place]) 02/22/2018  . Preventative health care 03/01/2017  . Diverticulitis 06/19/2016  . Skin cancer (melanoma) ([place]) 06/19/2016  . Change in multiple nevi 05/03/2016  . Personal history of tobacco use, presenting hazards to health 03/17/2016  . Rheumatoid arthritis ([place]) 02/29/2016  . Osteoarthritis 10/07/2015  . Atrial fibrillation ([place]) 10/12/2014  . Hypotension 10/12/2014  . HLD (hyperlipidemia) 01/27/2014  . History of syncope 01/27/2014  . History of colonic polyps 01/27/2014  . Cardiac pacemaker 01/27/2014  . Hypertriglyceridemia 01/27/2014  . Nocturia associated with benign prostatic hypertrophy 01/27/2014  . Tobacco abuse 01/27/2014     Past Medical History:  Diagnosis Date  . Arthritis 2019   dx of osteoarthritis and rhemautoid arthritis  . Cancer ([place])    lung  . Cataract 2019   left eye  . Chicken pox   . Collagen vascular disease ([place])   . 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 COPD (chronic obstructive pulmonary disease) ([place])   . Diverticulitis   . GERD (gastroesophageal reflux disease)   . 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 History of hiatal hernia   . History of stomach ulcers   . Hyperlipidemia   . Lymphocytic colitis   . Orthostatic hypotension   . Osteoarthritis   . Pancreatitis   . Paroxysmal atrial fibrillation (HCC)   . Presence of permanent cardiac pacemaker   . Small cell lung cancer ([place]) 04/15/2019     Past Surgical History:  Procedure Laterality Date  . APPENDECTOMY    . BACK SURGERY     bone chip lasered  . CARDIAC CATHETERIZATION    . CATARACT EXTRACTION W/ INTRAOCULAR LENS IMPLANT Right   . COLON SURGERY  1990's   12" removed of large intestine  . EYE SURGERY    . INSERT / REPLACE / [place]  . large  intestine block    . PACEMAKER INSERTION     MDT dual chamber pacemaker  . PORTA CATH INSERTION N/A 04/17/2019   Procedure: PORTA CATH INSERTION;  Surgeon: Algernon Huxley, MD;  Location: [place] [facility];  Service: Cardiovascular;  Laterality: N/A;  . PPM GENERATOR CHANGEOUT N/A 12/02/2018   Procedure: PPM GENERATOR CHANGEOUT;  Surgeon: Deboraha Sprang, MD;  Location: [place] [facility];  Service: Cardiovascular;  Laterality: N/A;  . TONSILLECTOMY AND ADENOIDECTOMY    . VIDEO BRONCHOSCOPY WITH ENDOBRONCHIAL ULTRASOUND N/A 04/07/2019   Procedure: VIDEO BRONCHOSCOPY WITH ENDOBRONCHIAL ULTRASOUND;  Surgeon: Tyler Pita, MD;  Location: [facility];  Service: Pulmonary;  Laterality: N/A;    Social History   Socioeconomic History  . Marital status: Married    Spouse name: Not on file  . Number of children: 2  . Years of education: Not on file  . Highest education level: Not on file  Occupational History  . Occupation: Retired  Tobacco Use  . Smoking status: Former Smoker    Packs/day: 1.00    Years: 60.00    Pack years: 60.00    Types: Cigarettes    Quit date: 04/29/2017    Years since quitting: 2.0  . Smokeless tobacco: Never Used  Substance and Sexual Activity  . Alcohol use: Yes    Alcohol/week: 5.0 standard drinks    Types: 5 Glasses of wine per week  . Drug use: No  .  Sexual activity: Never  Other Topics Concern  . Not on file  Social History Narrative   Married.   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      Does not have a living will.   Desires CPR, does not want prolonged life support if futile.   Social Determinants of Health   Financial Resource Strain: Low Risk   . Difficulty of Paying Living Expenses: Not hard at all  Food Insecurity: No Food Insecurity  . Worried About Charity fundraiser in the Last Year: Never true  . Ran Out of Food in the Last Year: Never true  Transportation Needs: No Transportation Needs  . Lack of Transportation  (Medical): No  . Lack of Transportation (Non-Medical): No  Physical Activity: Sufficiently Active  . Days of Exercise per Week: 7 days  . Minutes of Exercise per Session: 60 min  Stress: No Stress Concern Present  . Feeling of Stress : Not at all  Social Connections:   . Frequency of Communication with Friends and Family:   . Frequency of Social Gatherings with Friends and Family:   . Attends Religious Services:   . Active Member of Clubs or Organizations:   . Attends Archivist Meetings:   Marland Kitchen Marital Status:   Intimate Partner Violence: Not At Risk  . Fear of Current or Ex-Partner: No  . Emotionally Abused: No  . Physically Abused: No  . Sexually Abused: No     Family History  Problem Relation Age of Onset  . Lung cancer Mother   . Heart disease Father   . Alzheimer's disease Father      Current Facility-Administered Medications:  .  0.9 %  sodium chloride infusion, , Intravenous, Continuous, Agbata, Tochukwu, MD, Last Rate: 100 mL/hr at 05/27/19 2115, New Bag at 05/27/19 2115 .  0.9 %  sodium chloride infusion, 250 mL, Intravenous, PRN, Agbata, Tochukwu, MD .  acetaminophen (TYLENOL) tablet 650 mg, 650 mg, Oral, Q6H PRN **OR** acetaminophen (TYLENOL) suppository 650 mg, 650 mg, Rectal, Q6H PRN, Agbata, Tochukwu, MD .  brimonidine (ALPHAGAN) 0.15 % ophthalmic solution 1 drop, 1 drop, Both Eyes, BID, Agbata, Tochukwu, MD, 1 drop at 05/28/19 0842 .  cholecalciferol (VITAMIN D3) tablet 2,000 Units, 2,000 Units, Oral, Q lunch, Agbata, Tochukwu, MD .  famotidine (PEPCID) tablet 20 mg, 20 mg, Oral, BID, Agbata, Tochukwu, MD, 20 mg at 05/28/19 0841 .  fenofibrate tablet 160 mg, 160 mg, Oral, Daily, Agbata, Tochukwu, MD, 160 mg at 05/28/19 0842 .  folic acid (FOLVITE) tablet 1 mg, 1 mg, Oral, Daily, Agbata, Tochukwu, MD, 1 mg at 05/28/19 0841 .  gabapentin (NEURONTIN) capsule 100 mg, 100 mg, Oral, TID, Agbata, Tochukwu, MD, 100 mg at 05/28/19 0842 .  iohexol (OMNIPAQUE) 9  MG/ML oral solution 500 mL, 500 mL, Oral, Q1H, Amin, Sumayya, MD .  ipratropium (ATROVENT) nebulizer solution 0.5 mg, 0.5 mg, Nebulization, Q6H, Agbata, Tochukwu, MD, 0.5 mg at 05/28/19 0740 .  meropenem (MERREM) 1 g in sodium chloride 0.9 % 100 mL IVPB, 1 g, Intravenous, Q8H, Shanlever, Pierce Crane, RPH, Last Rate: 200 mL/hr at 05/28/19 0855, 1 g at 05/28/19 0855 .  mometasone-formoterol (DULERA) 100-5 MCG/ACT inhaler 2 puff, 2 puff, Inhalation, BID, Agbata, Tochukwu, MD, 2 puff at 05/28/19 0843 .  ondansetron (ZOFRAN) tablet 4 mg, 4 mg, Oral, Q6H PRN **OR** ondansetron (ZOFRAN) injection 4 mg, 4 mg, Intravenous, Q6H PRN, Agbata, Tochukwu, MD .  sodium chloride (OCEAN) 0.65 % nasal spray 1 spray, 1  spray, Each Nare, QHS PRN, Agbata, Tochukwu, MD .  sodium chloride flush (NS) 0.9 % injection 3 mL, 3 mL, Intravenous, Q12H, Agbata, Tochukwu, MD .  sodium chloride flush (NS) 0.9 % injection 3 mL, 3 mL, Intravenous, PRN, Agbata, Tochukwu, MD .  sucralfate (CARAFATE) tablet 1 g, 1 g, Oral, TID, Agbata, Tochukwu, MD .  traMADol (ULTRAM) tablet 25 mg, 25 mg, Oral, QHS PRN, Collier Bullock, MD   Physical exam:  Vitals:   05/27/19 1945 05/27/19 2325 05/28/19 0211 05/28/19 0722  BP:  120/65  116/78  Pulse:  84  79  Resp:  16  18  Temp:  98.7 F (37.1 C)  98.6 F (37 C)  TempSrc:  Oral  Oral  SpO2: 93% 96% 96% 97%  Weight:      Height:       Physical Exam  Constitutional: He is oriented to person, place, and time. No distress.  HENT:  Head: Normocephalic and atraumatic.  Nose: Nose normal.  Mouth/Throat: Oropharynx is clear and moist. No oropharyngeal exudate.  Eyes: Pupils are equal, round, and reactive to light. EOM are normal. No scleral icterus.  Cardiovascular: Normal rate and regular rhythm.  No murmur heard. Pulmonary/Chest: Effort normal. No respiratory distress.  Left lower lobe crackles.  He breathes comfortably on nasal cannula oxygen.  Abdominal: Soft. He exhibits no distension.  There is no abdominal tenderness.  Musculoskeletal:        General: No edema. Normal range of motion.     Cervical back: Normal range of motion and neck supple.  Neurological: He is alert and oriented to person, place, and time.  Skin: Skin is warm and dry. He is not diaphoretic. No erythema.  Psychiatric: Affect normal.        CMP Latest Ref Rng & Units 05/28/2019  Glucose 70 - 99 mg/dL 105(H)  BUN 8 - 23 mg/dL 14  Creatinine 0.61 - 1.24 mg/dL 0.84  Sodium 135 - 145 mmol/L 132(L)  Potassium 3.5 - 5.1 mmol/L 3.3(L)  Chloride 98 - 111 mmol/L 105  CO2 22 - 32 mmol/L 22  Calcium 8.9 - 10.3 mg/dL 7.9(L)  Total Protein 6.5 - 8.1 g/dL -  Total Bilirubin 0.3 - 1.2 mg/dL -  Alkaline Phos 38 - 126 U/L -  AST 15 - 41 U/L -  ALT 0 - 44 U/L -   CBC Latest Ref Rng & Units 05/28/2019  WBC 4.0 - 10.5 K/uL 0.7(LL)  Hemoglobin 13.0 - 17.0 g/dL 8.3(L)  Hematocrit 39.0 - 52.0 % 24.4(L)  Platelets 150 - 400 K/uL 47(L)    RADIOGRAPHIC STUDIES: I have personally reviewed the radiological images as listed and agreed with the findings in the report. DG Chest 2 View  Result Date: 05/27/2019 CLINICAL DATA:  Suspected sepsis. EXAM: CHEST - 2 VIEW COMPARISON:  PET-CT 03/20/2019. Chest CT 02/27/2019. Chest x-ray 04/11/2014. FINDINGS: PowerPort catheter noted with tip over the cavoatrial junction. Cardiac pacer stable position. Heart size normal. COPD. Chronic bilateral interstitial prominence noted. Superimposed active pneumonitis cannot be excluded. No focal alveolar infiltrate. No pleural effusion or pneumothorax. IMPRESSION: 1.  PowerPort catheter noted with tip over SVC. 2.  Cardiac pacer stable position.  Heart size normal. 3. COPD. Chronic interstitial disease. Superimposed active pneumonitis cannot be excluded. No focal alveolar infiltrate noted. Electronically Signed   By: Marcello Moores  Register   On: 05/27/2019 12:21   CT ANGIO CHEST PE W OR WO CONTRAST  Result Date: 05/27/2019 CLINICAL DATA:  79 year old  male with  small cell lung cancer on radiation treatment and chemotherapy. Fever and shortness of breath. EXAM: CT ANGIOGRAPHY CHEST WITH CONTRAST TECHNIQUE: Multidetector CT imaging of the chest was performed using the standard protocol during bolus administration of intravenous contrast. Multiplanar CT image reconstructions and MIPs were obtained to evaluate the vascular anatomy. CONTRAST:  64mL OMNIPAQUE IOHEXOL 350 MG/ML SOLN COMPARISON:  Chest radiograph dated 05/27/2019 and CT dated 02/27/2019 FINDINGS: Cardiovascular: There is no cardiomegaly or pericardial effusion. Coronary vascular calcification primarily involving the LAD. Left pectoral pacemaker device noted. There is advanced atherosclerotic calcification of the thoracic aorta. No aneurysmal dilatation or dissection. There is no CT evidence of pulmonary embolism. Mediastinum/Nodes: Significant interval decrease in the size of the enlarged lymph node seen on the prior CT anterior to the carina now measuring approximately 11 mm in short axis (previously 21 mm). Decrease in the size of subcarinal lymph node now measuring approximately 9 mm in short axis. No new adenopathy. There is a small hiatal hernia. There is mild circumferential thickening of the esophagus with infiltration of the surrounding fat which may represent esophagitis. Clinical correlation is recommended. No mediastinal fluid collection. Lungs/Pleura: There is a background of emphysema. The previously seen right upper lobe subpleural lesion is not identified on today's exam. A 2 mm subpleural nodule (63/6) may correspond to the previously seen 6 mm nodule. A 7 mm nodule at the left lung base (71/6) has minimally decreased in size (previously measuring approximately 8 mm). There is trace right pleural effusion, new since the prior CT. No consolidative changes or pneumothorax. The central airways are patent. Upper Abdomen: There is scattered colonic diverticula. Apparent area of colonic  thickening and mild pericolonic stranding in the right upper abdomen (series 4 image 100 and coronal series 7, image 22) is suboptimally evaluated due to partial visualization but is concerning for diverticulitis. CT of the abdomen pelvis may provide better evaluation. Musculoskeletal: Osteopenia with mild degenerative changes. No acute osseous pathology. Review of the MIP images confirms the above findings. IMPRESSION: 1. No acute intrathoracic pathology. No CT evidence of pulmonary embolism. 2. Significant interval decrease in the size of the previously seen mediastinal lymph nodes. No new adenopathy. 3. Near complete resolution of the previously seen right upper lobe hypermetabolic subpleural nodule. 4. Thickened appearance of the esophagus concerning for esophagitis. Clinical correlation is recommended. 5. Partially visualized area of colonic thickening and pericolonic stranding in the right upper abdomen, concerning for diverticulitis. CT of the abdomen pelvis may provide better evaluation. 6. Aortic Atherosclerosis (ICD10-I70.0) and Emphysema (ICD10-J43.9). Electronically Signed   By: Anner Crete M.D.   On: 05/27/2019 15:37    Assessment and plan-   #Neutropenic fever, ANC 0.1.Afebrile since admission.  Agree with broad-spectrum IV antibiotics until [place].  Currently he is on vancomycin, aztreonam and Flagyl. Vancomycin can be discontinued if there is no evidence of gram-positive infection. Since that he is hemodynamically stable, I will hold off growth factor given recent radiation-concern of increased pneumonitis risk combining growth factor with radiation. Infectious work-up showed negative blood culture so far, UA is negative. CT scan images were independently reviewed by me and discussed with patient. Add Peridex swish and spit twice daily for mucositis phylaxis.   #Anemia, hemoglobin further decreased to 8.3, likely secondary to chemotherapy.  Transfuse irradiated PRBC if hemoglobin less  than 7 or if he becomes symptomatic. 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  Continue folic acid.  Check B12.Marland Kitchen  #Thrombocytopenia, currently he is off Eliquis. Low  platelet counts due to chemotherapy.  Continue monitor.  Thank you for allowing me to participate in the care of this patient. Plan was discussed with Dr.Amin.    Earlie Server, MD, PhD Hematology Oncology [facility] Pager- [phone] 05/28/2019

## 2019-05-28 NOTE — Consult Note (Addendum)
Pharmacy Antibiotic Note  Darryl White is a 79 y.o. male admitted on 05/27/2019 with Febrile Neutropenia.  Pharmacy has been consulted for Meropenem dosing.  Plan: Will change Meropenem 1g Q12H to q8h based on improved renal function   Height: 5\' 6"  (167.6 cm) Weight: 73.5 kg (162 lb) IBW/kg (Calculated) : 63.8  Temp (24hrs), Avg:99.1 F (37.3 C), Min:98.6 F (37 C), Max:99.8 F (37.7 C)  Recent Labs  Lab 05/21/19 0951 05/27/19 1052 05/27/19 1152 05/27/19 1423 05/28/19 0549  WBC 1.6* 0.5* 0.6*  --  0.7*  CREATININE 1.12 1.18 1.12  --  0.84  LATICACIDVEN  --  1.2 1.2 0.9  --     Estimated Creatinine Clearance: 65.4 mL/min (by C-G formula based on SCr of 0.84 mg/dL).    Allergies  Allergen Reactions  . Sulfa Antibiotics Hives and Itching  . Penicillins Hives and Palpitations    Did it involve swelling of the face/tongue/throat, SOB, or low BP? No Did it involve sudden or severe rash/hives, skin peeling, or any reaction on the inside of your mouth or nose? No Did you need to seek medical attention at a hospital or doctor's office? Unknown When did it last happen?childhood allergy If all above answers are "NO", may proceed with cephalosporin use.     Antimicrobials this admission: 4/6 Aztreonam/Metronidazole/Vancomycin x 1 4/6 Meropenem >>  Dose adjustments this admission: Meropenem 1g q12h to 1g q8h per improved renal function  Microbiology results: BCx 4/6 pending 4/6 COVID/FLU NEG  Thank you for allowing pharmacy to be a part of this patient's care.  Lu Duffel, PharmD, BCPS Clinical Pharmacist 05/28/2019 8:29 AM

## 2019-05-28 NOTE — Progress Notes (Signed)
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  GOAL:   Patient will meet greater than or equal to 90% of their needs  MONITOR:   PO intake, Supplement acceptance, Labs, Weight trends, Skin, I & O's  REASON FOR ASSESSMENT:   Malnutrition Screening Tool    ASSESSMENT:   79 y.o. male with medical history significant for paroxysmal atrial fibrillation not on anticoagulation due to bleeding issues and thrombocytopenia, history of small cell lung cancer on chemotherapy and radiation therapy; last chemotherapy was 3 weeks ago and last radiation therapy was on 05/26/19, COPD, RA and GERD who is admitted with neutropenic fever.  Met with pt in room today. Pt reports poor appetite and oral intake for 1 week pta. Pt reports that he is usually a fairly good eater but that having chemotherapy has really changed his taste and desire to eat. Pt reports that he does drink two chocolate Boost per day at home. Pt reports that he is feeling much better today and that he ate 100% of his breakfast. Pt was drinking oral contrast for his CT which is scheduled for today at time of RD visit. RD will add supplements and MVI to help pt meet his estimated needs. Per chart, pt is down 9lbs(5%) over the past two weeks; this is significant. Pt is aware of his weight loss as he reports that he weighs himself daily and records his weights.   Medications reviewed and include: D3, pepcid, folic acid,  carafate, meropenem  Labs reviewed: Na 132(L), K 3.3(L) Wbc 0.7(L), Hgb 8.3(L), Hct 24.4(L), MCV 107.0(H), MCH 36.4(H)  NUTRITION - FOCUSED PHYSICAL EXAM:    Most Recent Value  Orbital Region  No depletion  Upper Arm Region  Moderate depletion  Thoracic and Lumbar Region  Mild depletion  Buccal Region  Mild depletion  Temple Region  Mild depletion  Clavicle Bone Region  Moderate depletion  Clavicle and Acromion Bone Region  Moderate depletion  Scapular Bone Region  Moderate depletion  Dorsal Hand  Mild depletion  Patellar Region  Moderate depletion  Anterior Thigh Region  Moderate depletion  Posterior Calf Region  Moderate depletion  Edema (RD Assessment)  None  Hair  Reviewed  Eyes  Reviewed  Mouth  Reviewed  Skin  Reviewed  Nails  Reviewed     Diet Order:   Diet Order            Diet regular Room service appropriate? Yes; Fluid consistency: Thin  Diet effective now             EDUCATION NEEDS:   Education needs have been addressed  Skin:  Skin Assessment: Reviewed RN Assessment(ecchymosis)  Last BM:  4/6  Height:   Ht Readings from Last 1 Encounters:  05/27/19 5' 6" (1.676 m)    Weight:   Wt Readings from Last 1 Encounters:  05/27/19 73.5 kg    Ideal Body Weight:  64.5 kg  BMI:  Body mass index is 26.15 kg/m.  Estimated  Nutritional Needs:   Kcal:  1900-2200kcal/day  Protein:  95-110g/day  Fluid:  >1.7L/day  Koleen Distance MS, RD, LDN Please refer to [facility] for RD and/or RD on-call/weekend/after hours pager

## 2019-05-28 NOTE — Progress Notes (Signed)
PROGRESS NOTE    DRYDEN TAPLEY  EXN:[id number] DOB: 30-Oct-1940 DOA: 05/27/2019 PCP: Pleas Koch, NP   Brief Narrative:  Darryl RODRIQUEZ is a 79 y.o. 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  Last chemotherapy was 3 weeks ago and last radiation therapy was on 05/26/19.  Patient was sent to the emergency room from the oncology clinic due to concerns of neutropenic fever.  He had a temperature of 100.87F and an ANC of 0.1.  He had been started empirically on Levaquin by his oncologist for low-grade fever last week. At the oncologist office patient was noted to be tachycardic with heart rate of 101, pulse oximetry was 87% on room air requiring oxygen supplementation at 2 L. Patient complains of a cough productive of white phlegm, as well as left sided chest pain with radiation to the right side of his chest. He was found to have white cell count of 0.6 and an ANC of 0.1.  Admitted for neutropenic fever.  Subjective: Patient was having some upper and lower quadrant abdominal pain yesterday.  It improved today.  Denies any urinary symptoms.  He was also complaining of difficulty taking sucralfate as it was causing pain and some nausea with swallowing and does not want to take it anymore.  Assessment & Plan:   Principal Problem:   Neutropenic fever ([place]) Active Problems:   Atrial fibrillation (H[facility])   COPD with acute exacerbation (H[facility])   Small cell lung cancer in adult ([place])   Thrombocytopenia (HCC)   Anemia   Malnutrition of moderate degree  Neutropenic fever/small cell lung cancer on chemotherapy and radiation.   Patient has pancytopenia secondary to chemotherapy and radiation.  Remained afebrile since admission.  His last chemotherapy was 3 weeks ago and last radiation was on 05/26/2019.  Oncology is on board and they  will avoid colony stimulating factor at this time due to increased risk of pneumonitis with radiation. CTA done with elevated D-dimer and hypoxia was negative for PE, interval improvement in his lymphadenopathy and lung mass.  There was some concern of diverticulitis and they were advising CT abdomen for further evaluation. UA quite bland.  Blood culture negative so far. -Will obtain CT abdomen and pelvis for possible diverticulitis. -Continue with meropenem. -Monitor cell counts. -Diacontinue IV fluid as patient is eating and drinking well, few basal crackles with no respiratory difficulty.  Paroxysmal atrial fibrillation.  Currently in sinus rhythm. -Eliquis is on hold due to thrombocytopenia and recurrent epistaxis. -Continue to monitor.  COPD.  No concern for any acute exacerbation. -Continue home as needed bronchodilators.  GERD.  Patient is having some GERD symptoms.  He does not want to take sucralfate as it causing difficulty swallowing and associated with pain and some nausea. -Discontinue sucralfate. -Continue with Pepcid. -PPI can be added if needed.  Objective: Vitals:   05/27/19 1945 05/27/19 2325 05/28/19 0211 05/28/19 0722  BP:  120/65  116/78  Pulse:  84  79  Resp:  16  18  Temp:  98.7 F (37.1 C)  98.6 F (37 C)  TempSrc:  Oral  Oral  SpO2: 93% 96% 96% 97%  Weight:      Height:        Intake/Output Summary (Last 24 hours) at 05/28/2019 1333 Last data filed at 05/28/2019 1112 Gross per 24 hour  Intake 1969.07 ml  Output  1225 ml  Net 744.07 ml   Filed Weights   05/27/19 1150  Weight: 73.5 kg    Examination:  General exam: Appears calm and comfortable  Respiratory system: Few basal crackles. Respiratory effort normal. Cardiovascular system: S1 & S2 heard, RRR. No JVD, murmurs, rubs, gallops or clicks. Gastrointestinal system: Soft, nontender, nondistended, bowel sounds positive. Central nervous system: Alert and oriented. No focal neurological  deficits.Symmetric 5 x 5 power. Extremities: No edema, no cyanosis, pulses intact and symmetrical. Psychiatry: Judgement and insight appear normal. Mood & affect appropriate.    DVT prophylaxis: SCDs. Code Status: Full Family Communication: Wife was updated at bedside. Disposition Plan: Pending improvement and completion of work-up for any source of infection.  Patient will go back home.  Patient is high risk for deterioration due to his immunocompromise state.  Consultants:   Oncology  Procedures:  Antimicrobials:  Meropenem  Data Reviewed: I have personally reviewed following labs and imaging studies  CBC: Recent Labs  Lab 05/27/19 1052 05/27/19 1152 05/28/19 0549  WBC 0.5* 0.6* 0.7*  NEUTROABS 0.1* 0.1*  --   HGB 9.8* 10.5* 8.3*  HCT 28.6* 31.0* 24.4*  MCV 105.9* 107.3* 107.0*  PLT 50* 58* 47*   Basic Metabolic Panel: Recent Labs  Lab 05/27/19 1052 05/27/19 1152 05/28/19 0549  NA 134* 134* 132*  K 4.1 3.7 3.3*  CL 101 100 105  CO2 26 25 22   GLUCOSE 119* 118* 105*  BUN 20 20 14   CREATININE 1.18 1.12 0.84  CALCIUM 9.0 9.1 7.9*   GFR: Estimated Creatinine Clearance: 65.4 mL/min (by C-G formula based on SCr of 0.84 mg/dL). Liver Function Tests: Recent Labs  Lab 05/27/19 1052 05/27/19 1152  AST 25 27  ALT 18 16  ALKPHOS 78 74  BILITOT 0.8 0.6  PROT 6.4* 6.8  ALBUMIN 3.2* 3.4*   No results for input(s): LIPASE, AMYLASE in the last 168 hours. No results for input(s): AMMONIA in the last 168 hours. Coagulation Profile: Recent Labs  Lab 05/27/19 1152  INR 1.0   Cardiac Enzymes: No results for input(s): CKTOTAL, CKMB, CKMBINDEX, TROPONINI in the last 168 hours. BNP (last 3 results) No results for input(s): PROBNP in the last 8760 hours. HbA1C: No results for input(s): HGBA1C in the last 72 hours. CBG: No results for input(s): GLUCAP in the last 168 hours. Lipid Profile: No results for input(s): CHOL, HDL, LDLCALC, TRIG, CHOLHDL, LDLDIRECT in  the last 72 hours. Thyroid Function Tests: No results for input(s): TSH, T4TOTAL, FREET4, T3FREE, THYROIDAB in the last 72 hours. Anemia Panel: No results for input(s): VITAMINB12, FOLATE, FERRITIN, TIBC, IRON, RETICCTPCT in the last 72 hours. Sepsis Labs: Recent Labs  Lab 05/27/19 1052 05/27/19 1152 05/27/19 1423 05/28/19 0549  PROCALCITON  --   --   --  <0.10  LATICACIDVEN 1.2 1.2 0.9  --     Recent Results (from the past 240 hour(s))  Culture, blood (Routine x 2)     Status: None (Preliminary result)   Collection Time: 05/27/19 11:52 AM   Specimen: BLOOD  Result Value Ref Range Status   Specimen Description BLOOD RIGHT ANTECUBITAL  Final   Special Requests   Final    BOTTLES DRAWN AEROBIC AND ANAEROBIC Blood Culture adequate volume   Culture   Final    NO GROWTH < 24 HOURS Performed at [facility], [address], [place], [place] [address]    Report Status PENDING  Incomplete  Culture, blood (Routine x 2)  Status: None (Preliminary result)   Collection Time: 05/27/19  1:09 PM   Specimen: BLOOD  Result Value Ref Range Status   Specimen Description BLOOD LEFT ANTECUBITAL  Final   Special Requests   Final    BOTTLES DRAWN AEROBIC AND ANAEROBIC Blood Culture adequate volume   Culture   Final    NO GROWTH < 24 HOURS Performed at [facility], [address], [place], [place] [address]    Report Status PENDING  Incomplete  Respiratory Panel by RT PCR (Flu A&B, Covid) - Nasopharyngeal Swab     Status: None   Collection Time: 05/27/19  1:09 PM   Specimen: Nasopharyngeal Swab  Result Value Ref Range Status   SARS Coronavirus 2 by RT PCR NEGATIVE NEGATIVE Final    Comment: (NOTE) SARS-CoV-2 target nucleic acids are NOT DETECTED. The SARS-CoV-2 RNA is generally detectable in upper respiratoy specimens during the acute phase of infection. The lowest concentration of SARS-CoV-2 viral copies this assay can detect is 131 copies/mL. A negative result  does not preclude SARS-Cov-2 infection and should not be used as the sole basis for treatment or other patient management decisions. A negative result may occur with  improper specimen collection/handling, submission of specimen other than nasopharyngeal swab, presence of viral mutation(s) within the areas targeted by this assay, and inadequate number of viral copies (<131 copies/mL). A negative result must be combined with clinical observations, patient history, and epidemiological information. The expected result is Negative. Fact Sheet for Patients:  PinkCheek.be Fact Sheet for Healthcare Providers:  GravelBags.it This test is not yet ap proved or cleared by the [place] FDA and  has been authorized for detection and/or diagnosis of SARS-CoV-2 by FDA under an Emergency Use Authorization (EUA). This EUA will remain  in effect (meaning this test can be used) for the duration of the COVID-19 declaration under Section 564(b)(1) of the Act, 21 U.S.C. section 360bbb-3(b)(1), unless the authorization is terminated or revoked sooner.    Influenza A by PCR NEGATIVE NEGATIVE Final   Influenza B by PCR NEGATIVE NEGATIVE Final    Comment: (NOTE) The Xpert Xpress SARS-CoV-2/FLU/RSV assay is intended as an aid in  the diagnosis of influenza from Nasopharyngeal swab specimens and  should not be used as a sole basis for treatment. Nasal washings and  aspirates are unacceptable for Xpert Xpress SARS-CoV-2/FLU/RSV  testing. Fact Sheet for Patients: PinkCheek.be Fact Sheet for Healthcare Providers: GravelBags.it This test is not yet approved or cleared by the [place] FDA and  has been authorized for detection and/or diagnosis of SARS-CoV-2 by  FDA under an Emergency Use Authorization (EUA). This EUA will remain  in effect (meaning this test can be used) for the duration of  the  Covid-19 declaration under Section 564(b)(1) of the Act, 21  U.S.C. section 360bbb-3(b)(1), unless the authorization is  terminated or revoked. Performed at [facility], [address] [place], [place] [address]   Culture, blood (x 2)     Status: None (Preliminary result)   Collection Time: 05/27/19  4:48 PM   Specimen: BLOOD  Result Value Ref Range Status   Specimen Description BLOOD LEFT ANTECUBITAL  Final   Special Requests   Final    BOTTLES DRAWN AEROBIC AND ANAEROBIC Blood Culture adequate volume   Culture   Final    NO GROWTH < 24 HOURS Performed at [facility], [address], [place], [place] [address]    Report Status PENDING  Incomplete  Culture,  blood (x 2)     Status: None (Preliminary result)   Collection Time: 05/27/19  4:54 PM   Specimen: BLOOD  Result Value Ref Range Status   Specimen Description BLOOD BLOOD RIGHT HAND  Final   Special Requests   Final    BOTTLES DRAWN AEROBIC ONLY Blood Culture adequate volume   Culture   Final    NO GROWTH < 24 HOURS Performed at [facility], [address], [place], [place] [address]    Report Status PENDING  Incomplete     Radiology Studies: DG Chest 2 View  Result Date: 05/27/2019 CLINICAL DATA:  Suspected sepsis. EXAM: CHEST - 2 VIEW COMPARISON:  PET-CT 03/20/2019. Chest CT 02/27/2019. Chest x-ray 04/11/2014. FINDINGS: PowerPort catheter noted with tip over the cavoatrial junction. Cardiac pacer stable position. Heart size normal. COPD. Chronic bilateral interstitial prominence noted. Superimposed active pneumonitis cannot be excluded. No focal alveolar infiltrate. No pleural effusion or pneumothorax. IMPRESSION: 1.  PowerPort catheter noted with tip over SVC. 2.  Cardiac pacer stable position.  Heart size normal. 3. COPD. Chronic interstitial disease. Superimposed active pneumonitis cannot be excluded. No focal alveolar infiltrate noted. Electronically Signed   By: Marcello Moores  Register    On: 05/27/2019 12:21   CT ANGIO CHEST PE W OR WO CONTRAST  Result Date: 05/27/2019 CLINICAL DATA:  79 year old male with small cell lung cancer on radiation treatment and chemotherapy. Fever and shortness of breath. EXAM: CT ANGIOGRAPHY CHEST WITH CONTRAST TECHNIQUE: Multidetector CT imaging of the chest was performed using the standard protocol during bolus administration of intravenous contrast. Multiplanar CT image reconstructions and MIPs were obtained to evaluate the vascular anatomy. CONTRAST:  23mL OMNIPAQUE IOHEXOL 350 MG/ML SOLN COMPARISON:  Chest radiograph dated 05/27/2019 and CT dated 02/27/2019 FINDINGS: Cardiovascular: There is no cardiomegaly or pericardial effusion. Coronary vascular calcification primarily involving the LAD. Left pectoral pacemaker device noted. There is advanced atherosclerotic calcification of the thoracic aorta. No aneurysmal dilatation or dissection. There is no CT evidence of pulmonary embolism. Mediastinum/Nodes: Significant interval decrease in the size of the enlarged lymph node seen on the prior CT anterior to the carina now measuring approximately 11 mm in short axis (previously 21 mm). Decrease in the size of subcarinal lymph node now measuring approximately 9 mm in short axis. No new adenopathy. There is a small hiatal hernia. There is mild circumferential thickening of the esophagus with infiltration of the surrounding fat which may represent esophagitis. Clinical correlation is recommended. No mediastinal fluid collection. Lungs/Pleura: There is a background of emphysema. The previously seen right upper lobe subpleural lesion is not identified on today's exam. A 2 mm subpleural nodule (63/6) may correspond to the previously seen 6 mm nodule. A 7 mm nodule at the left lung base (71/6) has minimally decreased in size (previously measuring approximately 8 mm). There is trace right pleural effusion, new since the prior CT. No consolidative changes or pneumothorax. The  central airways are patent. Upper Abdomen: There is scattered colonic diverticula. Apparent area of colonic thickening and mild pericolonic stranding in the right upper abdomen (series 4 image 100 and coronal series 7, image 22) is suboptimally evaluated due to partial visualization but is concerning for diverticulitis. CT of the abdomen pelvis may provide better evaluation. Musculoskeletal: Osteopenia with mild degenerative changes. No acute osseous pathology. Review of the MIP images confirms the above findings. IMPRESSION: 1. No acute intrathoracic pathology. No CT evidence of pulmonary embolism. 2. Significant interval decrease in the size of the previously  seen mediastinal lymph nodes. No new adenopathy. 3. Near complete resolution of the previously seen right upper lobe hypermetabolic subpleural nodule. 4. Thickened appearance of the esophagus concerning for esophagitis. Clinical correlation is recommended. 5. Partially visualized area of colonic thickening and pericolonic stranding in the right upper abdomen, concerning for diverticulitis. CT of the abdomen pelvis may provide better evaluation. 6. Aortic Atherosclerosis (ICD10-I70.0) and Emphysema (ICD10-J43.9). Electronically Signed   By: Anner Crete M.D.   On: 05/27/2019 15:37    Scheduled Meds: . brimonidine  1 drop Both Eyes BID  . chlorhexidine  15 mL Mouth/Throat BID  . cholecalciferol  2,000 Units Oral Q lunch  . famotidine  20 mg Oral BID  . feeding supplement (ENSURE ENLIVE)  237 mL Oral TID BM  . fenofibrate  160 mg Oral Daily  . folic acid  1 mg Oral Daily  . gabapentin  100 mg Oral TID  . mometasone-formoterol  2 puff Inhalation BID  . [START ON 05/29/2019] multivitamin with minerals  1 tablet Oral Daily  . potassium chloride  40 mEq Oral Once  . sodium chloride flush  3 mL Intravenous Q12H   Continuous Infusions: . sodium chloride    . meropenem (MERREM) IV 1 g (05/28/19 0855)     LOS: 1 day   Time spent: 45  minutes.  Lorella Nimrod, MD [facility]  If 7PM-7AM, please contact night-coverage [email or web address]  05/28/2019, 1:33 PM   This record has been created using Systems analyst. Errors have been sought and corrected,but may not always be located. Such creation errors do not reflect on the standard of care.

## 2019-05-28 NOTE — Progress Notes (Signed)
Pharmacist Chemotherapy Monitoring - Follow Up Assessment    I verify that I have reviewed each item in the below checklist:  . Regimen for the patient is scheduled for the appropriate day and plan matches scheduled date. Marland Kitchen Appropriate non-routine labs are ordered dependent on drug ordered. . If applicable, additional medications reviewed and ordered per protocol based on lifetime cumulative doses and/or treatment regimen.   Plan for follow-up and/or issues identified: No . I-vent associated with next due treatment: No . MD and/or nursing notified: No  Kelce Bouton K 05/28/2019 8:25 AM

## 2019-05-29 ENCOUNTER — Ambulatory Visit: Payer: Medicare PPO

## 2019-05-29 LAB — CBC WITH DIFFERENTIAL/PLATELET
Abs Immature Granulocytes: 0.02 10*3/uL (ref 0.00–0.07)
Basophils Absolute: 0 10*3/uL (ref 0.0–0.1)
Basophils Relative: 1 %
Eosinophils Absolute: 0 10*3/uL (ref 0.0–0.5)
Eosinophils Relative: 0 %
HCT: 25.7 % — ABNORMAL LOW (ref 39.0–52.0)
Hemoglobin: 8.9 g/dL — ABNORMAL LOW (ref 13.0–17.0)
Immature Granulocytes: 2 %
Lymphocytes Relative: 34 %
Lymphs Abs: 0.4 10*3/uL — ABNORMAL LOW (ref 0.7–4.0)
MCH: 36.9 pg — ABNORMAL HIGH (ref 26.0–34.0)
MCHC: 34.6 g/dL (ref 30.0–36.0)
MCV: 106.6 fL — ABNORMAL HIGH (ref 80.0–100.0)
Monocytes Absolute: 0.4 10*3/uL (ref 0.1–1.0)
Monocytes Relative: 36 %
Neutro Abs: 0.3 10*3/uL — ABNORMAL LOW (ref 1.7–7.7)
Neutrophils Relative %: 27 %
Platelets: 70 10*3/uL — ABNORMAL LOW (ref 150–400)
RBC: 2.41 MIL/uL — ABNORMAL LOW (ref 4.22–5.81)
RDW: 15.9 % — ABNORMAL HIGH (ref 11.5–15.5)
WBC: 1.2 10*3/uL — CL (ref 4.0–10.5)
nRBC: 0 % (ref 0.0–0.2)

## 2019-05-29 LAB — BASIC METABOLIC PANEL
Anion gap: 4 — ABNORMAL LOW (ref 5–15)
BUN: 13 mg/dL (ref 8–23)
CO2: 25 mmol/L (ref 22–32)
Calcium: 8.4 mg/dL — ABNORMAL LOW (ref 8.9–10.3)
Chloride: 106 mmol/L (ref 98–111)
Creatinine, Ser: 0.91 mg/dL (ref 0.61–1.24)
GFR calc Af Amer: >60 mL/min (ref >60)
GFR calc non Af Amer: >60 mL/min (ref >60)
Glucose, Bld: 92 mg/dL (ref 70–99)
Potassium: 3.7 mmol/L (ref 3.5–5.1)
Sodium: 135 mmol/L (ref 135–145)

## 2019-05-29 LAB — VITAMIN B12: Vitamin B-12: 496 pg/mL (ref 180–914)

## 2019-05-29 LAB — IMMATURE PLATELET FRACTION: Immature Platelet Fraction: 6.2 % (ref 1.2–8.6)

## 2019-05-29 LAB — MAGNESIUM: Magnesium: 1.9 mg/dL (ref 1.7–2.4)

## 2019-05-29 LAB — PROCALCITONIN: Procalcitonin: <0.1 ng/mL

## 2019-05-29 LAB — PHOSPHORUS: Phosphorus: 1.9 mg/dL — ABNORMAL LOW (ref 2.5–4.6)

## 2019-05-29 MED ORDER — K PHOS MONO-SOD PHOS DI & MONO 155-852-130 MG PO TABS
500.0000 mg | ORAL_TABLET | Freq: Two times a day (BID) | ORAL | Status: DC
  Administered 2019-05-29 – 2019-05-30 (×3): 500 mg via ORAL
  Filled 2019-05-29 (×4): qty 2

## 2019-05-29 MED ORDER — METRONIDAZOLE 500 MG PO TABS: 500.0000 mg | ORAL_TABLET | Freq: Three times a day (TID) | ORAL | Status: DC

## 2019-05-29 MED ORDER — SODIUM CHLORIDE 0.9 % IV SOLN
1.0000 g | Freq: Three times a day (TID) | INTRAVENOUS | Status: DC
  Administered 2019-05-29 – 2019-05-30 (×3): 1 g via INTRAVENOUS
  Filled 2019-05-29 (×5): qty 1

## 2019-05-29 MED ORDER — CEPHALEXIN 500 MG PO CAPS: 500.0000 mg | ORAL_CAPSULE | Freq: Three times a day (TID) | ORAL | Status: DC

## 2019-05-29 MED ORDER — LIDOCAINE 4 % EX CREA
TOPICAL_CREAM | Freq: Every day | CUTANEOUS | Status: DC | PRN
  Administered 2019-05-29: 1 via TOPICAL
  Filled 2019-05-29: qty 5

## 2019-05-29 NOTE — Progress Notes (Signed)
Hematology/Oncology Progress Note [facility] [phone] [phone]  Patient Care Team: Pleas Koch, NP as PCP - General (Internal Medicine) Deboraha Sprang, MD as Consulting Physician (Cardiology) Marlowe Sax, MD as Referring Physician (Internal Medicine) Birder Robson, MD as Referring Physician (Ophthalmology) Oneta Rack, MD as Referring Physician (Dermatology) Salli Real, DDS as Referring Physician (Dentistry) Telford Nab, RN as Oncology Nurse Navigator Earlie Server, MD as Consulting Physician (Hematology and Oncology)   Name of the patient: Darryl White  [id number]  May 06, 1940  Date of visit: 05/29/19   INTERVAL HISTORY-   No acute overnight event.  Patient reports breathing is better.  Review of systems- Review of Systems  Constitutional: Positive for appetite change and fatigue.  Respiratory: Negative for cough and shortness of breath.   Cardiovascular: Negative for leg swelling.  Gastrointestinal: Negative for abdominal pain and diarrhea.  Genitourinary: Negative for frequency.   Musculoskeletal: Negative for back pain.  Skin: Negative for rash and wound.  Neurological: Negative for dizziness.  Hematological: Negative for adenopathy.  Psychiatric/Behavioral: Negative for confusion.    Allergies  Allergen Reactions  . Sulfa Antibiotics Hives and Itching  . Penicillins Hives and Palpitations    Did it involve swelling of the face/tongue/throat, SOB, or low BP? No Did it involve sudden or severe rash/hives, skin peeling, or any reaction on the inside of your mouth or nose? No Did you need to seek medical attention at a hospital or doctor's office? Unknown When did it last happen?childhood allergy If all above answers are "NO", may proceed with cephalosporin use.     Patient Active Problem List   Diagnosis Date Noted  . Malnutrition of moderate degree 05/28/2019  . Thrombocytopenia  (Uncertain)   . Anemia   . Neutropenic fever ([place]) 05/27/2019  . 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 Malignant neoplasm of right lung ([place]) 04/11/2019  . Goals of care, counseling/discussion 04/11/2019  . Pulmonary nodules 03/13/2019  . GERD (gastroesophageal reflux disease) 03/13/2019  . Decreased renal function 03/04/2018  . COPD with acute exacerbation ([place]) 02/22/2018  . Preventative health care 03/01/2017  . Diverticulitis 06/19/2016  . Skin cancer (melanoma) ([place]) 06/19/2016  . Change in multiple nevi 05/03/2016  . Personal history of tobacco use, presenting hazards to health 03/17/2016  . Rheumatoid arthritis ([place]) 02/29/2016  . Osteoarthritis 10/07/2015  . Atrial fibrillation ([place]) 10/12/2014  . Hypotension 10/12/2014  . HLD (hyperlipidemia) 01/27/2014  . History of syncope 01/27/2014  . History of colonic polyps 01/27/2014  . Cardiac pacemaker 01/27/2014  . Hypertriglyceridemia 01/27/2014  . Nocturia associated with benign prostatic hypertrophy 01/27/2014  . Tobacco abuse 01/27/2014     Past Medical History:  Diagnosis Date  . Arthritis 2019   dx of osteoarthritis and rhemautoid arthritis  . Cancer ([place])    lung  . Cataract 2019   left eye  . Chicken pox   . Collagen vascular disease ([place])   . 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 COPD (chronic obstructive pulmonary disease) ([place])   . Diverticulitis   . GERD (gastroesophageal reflux disease)   . 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 History of hiatal hernia   . History of stomach ulcers   . Hyperlipidemia   . Lymphocytic colitis   . Orthostatic hypotension   . Osteoarthritis   . Pancreatitis   . Paroxysmal atrial fibrillation (HCC)   . Presence of permanent cardiac  pacemaker   . Small cell lung cancer ([place]) 04/15/2019     Past Surgical History:  Procedure Laterality Date  . APPENDECTOMY    . BACK SURGERY     bone chip lasered  . CARDIAC CATHETERIZATION    . CATARACT  EXTRACTION W/ INTRAOCULAR LENS IMPLANT Right   . COLON SURGERY  1990's   12" removed of large intestine  . EYE SURGERY    . INSERT / REPLACE / [place]  . large intestine block    . PACEMAKER INSERTION     MDT dual chamber pacemaker  . PORTA CATH INSERTION N/A 04/17/2019   Procedure: PORTA CATH INSERTION;  Surgeon: Algernon Huxley, MD;  Location: [place] [facility];  Service: Cardiovascular;  Laterality: N/A;  . PPM GENERATOR CHANGEOUT N/A 12/02/2018   Procedure: PPM GENERATOR CHANGEOUT;  Surgeon: Deboraha Sprang, MD;  Location: [place] [facility];  Service: Cardiovascular;  Laterality: N/A;  . TONSILLECTOMY AND ADENOIDECTOMY    . VIDEO BRONCHOSCOPY WITH ENDOBRONCHIAL ULTRASOUND N/A 04/07/2019   Procedure: VIDEO BRONCHOSCOPY WITH ENDOBRONCHIAL ULTRASOUND;  Surgeon: Tyler Pita, MD;  Location: [facility] OR[facility];  Service: Pulmonary;  Laterality: N/A;    Social History   Socioeconomic History  . Marital status: Married    Spouse name: Not on file  . Number of children: 2  . Years of education: Not on file  . Highest education level: Not on file  Occupational History  . Occupation: Retired  Tobacco Use  . Smoking status: Former Smoker    Packs/day: 1.00    Years: 60.00    Pack years: 60.00    Types: Cigarettes    Quit date: 04/29/2017    Years since quitting: 2.0  . Smokeless tobacco: Never Used  Substance and Sexual Activity  . Alcohol use: Yes    Alcohol/week: 5.0 standard drinks    Types: 5 Glasses of wine per week  . Drug use: No  . Sexual activity: Never  Other Topics Concern  . Not on file  Social History Narrative   Married.   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      Does not have a living will.   Desires CPR, does not want prolonged life support if futile.   Social Determinants of Health   Financial Resource Strain: Low Risk   . Difficulty of Paying Living Expenses: Not hard at all  Food Insecurity: No Food Insecurity  . Worried  About Charity fundraiser in the Last Year: Never true  . Ran Out of Food in the Last Year: Never true  Transportation Needs: No Transportation Needs  . Lack of Transportation (Medical): No  . Lack of Transportation (Non-Medical): No  Physical Activity: Sufficiently Active  . Days of Exercise per Week: 7 days  . Minutes of Exercise per Session: 60 min  Stress: No Stress Concern Present  . Feeling of Stress : Not at all  Social Connections:   . Frequency of Communication with Friends and Family:   . Frequency of Social Gatherings with Friends and Family:   . Attends Religious Services:   . Active Member of Clubs or Organizations:   . Attends Archivist Meetings:   Marland Kitchen Marital Status:   Intimate Partner Violence: Not At Risk  . Fear of Current or Ex-Partner: No  . Emotionally Abused: No  . Physically Abused: No  . Sexually Abused: No     Family History  Problem Relation Age of Onset  .  Lung cancer Mother   . Heart disease Father   . Alzheimer's disease Father      Current Facility-Administered Medications:  .  0.9 %  sodium chloride infusion, 250 mL, Intravenous, PRN, Agbata, Tochukwu, MD, Last Rate: 10 mL/hr at 05/29/19 0647, 250 mL at 05/29/19 0647 .  acetaminophen (TYLENOL) tablet 650 mg, 650 mg, Oral, Q6H PRN **OR** acetaminophen (TYLENOL) suppository 650 mg, 650 mg, Rectal, Q6H PRN, Agbata, Tochukwu, MD .  brimonidine (ALPHAGAN) 0.15 % ophthalmic solution 1 drop, 1 drop, Both Eyes, BID, Agbata, Tochukwu, MD, 1 drop at 05/29/19 1004 .  chlorhexidine (PERIDEX) 0.12 % solution 15 mL, 15 mL, Mouth/Throat, BID, Earlie Server, MD, 15 mL at 05/29/19 0956 .  cholecalciferol (VITAMIN D3) tablet 2,000 Units, 2,000 Units, Oral, Q lunch, Agbata, Tochukwu, MD, 2,000 Units at 05/29/19 1201 .  famotidine (PEPCID) tablet 20 mg, 20 mg, Oral, BID, Agbata, Tochukwu, MD, 20 mg at 05/29/19 0955 .  feeding supplement (ENSURE ENLIVE) (ENSURE ENLIVE) liquid 237 mL, 237 mL, Oral, TID BM, Amin,  Sumayya, MD, 237 mL at 05/29/19 1424 .  fenofibrate tablet 160 mg, 160 mg, Oral, Daily, Agbata, Tochukwu, MD, 160 mg at 05/29/19 0954 .  folic acid (FOLVITE) tablet 1 mg, 1 mg, Oral, Daily, Agbata, Tochukwu, MD, 1 mg at 05/29/19 0955 .  gabapentin (NEURONTIN) capsule 100 mg, 100 mg, Oral, TID, Agbata, Tochukwu, MD, 100 mg at 05/29/19 0954 .  ipratropium (ATROVENT) nebulizer solution 0.5 mg, 0.5 mg, Nebulization, Q6H PRN, Lorella Nimrod, MD .  lidocaine (LMX) 4 % cream, , Topical, Daily PRN, Lorella Nimrod, MD, 1 application at 16/10/96 0855 .  meropenem (MERREM) 1 g in sodium chloride 0.9 % 100 mL IVPB, 1 g, Intravenous, Q8H, Amin, Sumayya, MD, Last Rate: 200 mL/hr at 05/29/19 1442, 1 g at 05/29/19 1442 .  mometasone-formoterol (DULERA) 100-5 MCG/ACT inhaler 2 puff, 2 puff, Inhalation, BID, Agbata, Tochukwu, MD, 2 puff at 05/29/19 0855 .  multivitamin with minerals tablet 1 tablet, 1 tablet, Oral, Daily, Lorella Nimrod, MD, 1 tablet at 05/29/19 0954 .  ondansetron (ZOFRAN) tablet 4 mg, 4 mg, Oral, Q6H PRN **OR** ondansetron (ZOFRAN) injection 4 mg, 4 mg, Intravenous, Q6H PRN, Agbata, Tochukwu, MD, 4 mg at 05/28/19 1648 .  phosphorus (K PHOS NEUTRAL) tablet 500 mg, 500 mg, Oral, BID, Lorella Nimrod, MD, 500 mg at 05/29/19 1423 .  sodium chloride (OCEAN) 0.65 % nasal spray 1 spray, 1 spray, Each Nare, QHS PRN, Agbata, Tochukwu, MD .  sodium chloride flush (NS) 0.9 % injection 3 mL, 3 mL, Intravenous, Q12H, Agbata, Tochukwu, MD, 3 mL at 05/28/19 2334 .  sodium chloride flush (NS) 0.9 % injection 3 mL, 3 mL, Intravenous, PRN, Agbata, Tochukwu, MD .  traMADol (ULTRAM) tablet 25 mg, 25 mg, Oral, QHS PRN, Agbata, Tochukwu, MD, 25 mg at 05/28/19 2037   Physical exam:  Vitals:   05/28/19 1554 05/28/19 2323 05/29/19 0804 05/29/19 1242  BP: 112/77 127/81 113/79   Pulse: 94 90 83   Resp: 18 17 20    Temp: 98 F (36.7 C) 98.5 F (36.9 C) 98.6 F (37 C)   TempSrc: Oral Oral Oral   SpO2: 97% 92% 93% 94%    Weight:      Height:       Physical Exam  Constitutional: He is oriented to person, place, and time. No distress.  HENT:  Head: Normocephalic and atraumatic.  Nose: Nose normal.  Mouth/Throat: Oropharynx is clear and moist. No oropharyngeal exudate.  Eyes: Pupils  are equal, round, and reactive to light. EOM are normal. No scleral icterus.  Cardiovascular: Normal rate and regular rhythm.  No murmur heard. Pulmonary/Chest: Effort normal. No respiratory distress.  Abdominal: Soft. He exhibits no distension. There is no abdominal tenderness.  Musculoskeletal:        General: No edema. Normal range of motion.     Cervical back: Normal range of motion and neck supple.  Neurological: He is alert and oriented to person, place, and time.  Skin: Skin is warm and dry. He is not diaphoretic. No erythema.  Psychiatric: Affect normal.       CMP Latest Ref Rng & Units 05/29/2019  Glucose 70 - 99 mg/dL 92  BUN 8 - 23 mg/dL 13  Creatinine 0.61 - 1.24 mg/dL 0.91  Sodium 135 - 145 mmol/L 135  Potassium 3.5 - 5.1 mmol/L 3.7  Chloride 98 - 111 mmol/L 106  CO2 22 - 32 mmol/L 25  Calcium 8.9 - 10.3 mg/dL 8.4(L)  Total Protein 6.5 - 8.1 g/dL -  Total Bilirubin 0.3 - 1.2 mg/dL -  Alkaline Phos 38 - 126 U/L -  AST 15 - 41 U/L -  ALT 0 - 44 U/L -   CBC Latest Ref Rng & Units 05/29/2019  WBC 4.0 - 10.5 K/uL 1.2(LL)  Hemoglobin 13.0 - 17.0 g/dL 8.9(L)  Hematocrit 39.0 - 52.0 % 25.7(L)  Platelets 150 - 400 K/uL 70(L)    RADIOGRAPHIC STUDIES: I have personally reviewed the radiological images as listed and agreed with the findings in the report. DG Chest 2 View  Result Date: 05/27/2019 CLINICAL DATA:  Suspected sepsis. EXAM: CHEST - 2 VIEW COMPARISON:  PET-CT 03/20/2019. Chest CT 02/27/2019. Chest x-ray 04/11/2014. FINDINGS: PowerPort catheter noted with tip over the cavoatrial junction. Cardiac pacer stable position. Heart size normal. COPD. Chronic bilateral interstitial prominence noted.  Superimposed active pneumonitis cannot be excluded. No focal alveolar infiltrate. No pleural effusion or pneumothorax. IMPRESSION: 1.  PowerPort catheter noted with tip over SVC. 2.  Cardiac pacer stable position.  Heart size normal. 3. COPD. Chronic interstitial disease. Superimposed active pneumonitis cannot be excluded. No focal alveolar infiltrate noted. Electronically Signed   By: Marcello Moores  Register   On: 05/27/2019 12:21   CT ANGIO CHEST PE W OR WO CONTRAST  Result Date: 05/27/2019 CLINICAL DATA:  79 year old male with small cell lung cancer on radiation treatment and chemotherapy. Fever and shortness of breath. EXAM: CT ANGIOGRAPHY CHEST WITH CONTRAST TECHNIQUE: Multidetector CT imaging of the chest was performed using the standard protocol during bolus administration of intravenous contrast. Multiplanar CT image reconstructions and MIPs were obtained to evaluate the vascular anatomy. CONTRAST:  30mL OMNIPAQUE IOHEXOL 350 MG/ML SOLN COMPARISON:  Chest radiograph dated 05/27/2019 and CT dated 02/27/2019 FINDINGS: Cardiovascular: There is no cardiomegaly or pericardial effusion. Coronary vascular calcification primarily involving the LAD. Left pectoral pacemaker device noted. There is advanced atherosclerotic calcification of the thoracic aorta. No aneurysmal dilatation or dissection. There is no CT evidence of pulmonary embolism. Mediastinum/Nodes: Significant interval decrease in the size of the enlarged lymph node seen on the prior CT anterior to the carina now measuring approximately 11 mm in short axis (previously 21 mm). Decrease in the size of subcarinal lymph node now measuring approximately 9 mm in short axis. No new adenopathy. There is a small hiatal hernia. There is mild circumferential thickening of the esophagus with infiltration of the surrounding fat which may represent esophagitis. Clinical correlation is recommended. No mediastinal fluid collection. Lungs/Pleura: There  is a background of  emphysema. The previously seen right upper lobe subpleural lesion is not identified on today's exam. A 2 mm subpleural nodule (63/6) may correspond to the previously seen 6 mm nodule. A 7 mm nodule at the left lung base (71/6) has minimally decreased in size (previously measuring approximately 8 mm). There is trace right pleural effusion, new since the prior CT. No consolidative changes or pneumothorax. The central airways are patent. Upper Abdomen: There is scattered colonic diverticula. Apparent area of colonic thickening and mild pericolonic stranding in the right upper abdomen (series 4 image 100 and coronal series 7, image 22) is suboptimally evaluated due to partial visualization but is concerning for diverticulitis. CT of the abdomen pelvis may provide better evaluation. Musculoskeletal: Osteopenia with mild degenerative changes. No acute osseous pathology. Review of the MIP images confirms the above findings. IMPRESSION: 1. No acute intrathoracic pathology. No CT evidence of pulmonary embolism. 2. Significant interval decrease in the size of the previously seen mediastinal lymph nodes. No new adenopathy. 3. Near complete resolution of the previously seen right upper lobe hypermetabolic subpleural nodule. 4. Thickened appearance of the esophagus concerning for esophagitis. Clinical correlation is recommended. 5. Partially visualized area of colonic thickening and pericolonic stranding in the right upper abdomen, concerning for diverticulitis. CT of the abdomen pelvis may provide better evaluation. 6. Aortic Atherosclerosis (ICD10-I70.0) and Emphysema (ICD10-J43.9). Electronically Signed   By: Anner Crete M.D.   On: 05/27/2019 15:37   CT ABDOMEN PELVIS W CONTRAST  Result Date: 05/28/2019 CLINICAL DATA:  Fever of unknown origin.  Small cell lung carcinoma. EXAM: CT ABDOMEN AND PELVIS WITH CONTRAST TECHNIQUE: Multidetector CT imaging of the abdomen and pelvis was performed using the standard protocol  following bolus administration of intravenous contrast. CONTRAST:  100 mL OMNIPAQUE IOHEXOL 300 MG/ML  SOLN COMPARISON:  CT chest 05/27/2019.  PET CT scan 03/20/2019. FINDINGS: Lower chest: Lung bases again demonstrate severe emphysematous disease and basilar fibrosis. Small right pleural effusion noted. No left pleural effusion or pericardial effusion. Hepatobiliary: No focal liver abnormality is seen. No gallstones, gallbladder wall thickening, or biliary dilatation. Pancreas: Unremarkable. No pancreatic ductal dilatation or surrounding inflammatory changes. Spleen: Normal in size without focal abnormality. Adrenals/Urinary Tract: Adrenal glands are unremarkable. Small bilateral renal cysts are unchanged. The kidneys otherwise appear normal. Ureters and urinary bladder are unremarkable. Bladder is unremarkable. Stomach/Bowel: There are areas of marked wall thickening of the colon with surrounding stranding, most notable in the proximal to mid transverse and mid to distal descending consistent with colitis. The appendix has been removed. The patient has a small hiatal hernia. The stomach is otherwise unremarkable. Small bowel appears normal. Vascular/Lymphatic: Atherosclerosis is noted. The descending abdominal aorta measures up to 2.7 cm in diameter, unchanged. Reproductive: Prostate is unremarkable. Other: None. Musculoskeletal: No acute or focal abnormality. IMPRESSION: The examination is positive for segmental infectious or inflammatory colitis most notable in the proximal to mid transverse and distal descending. No other acute abnormality. Severe emphysema. Small right pleural effusion. Atherosclerosis. Small hiatal hernia. Electronically Signed   By: Inge Rise M.D.   On: 05/28/2019 14:04    Assessment and plan-   #Neutropenic fever, afebrile. Blood culture has no growth for 2 days. Labs reviewed and discussed with patient. ANC has improved to 0.3.  Patient is hemodynamically stable.  Hold off  colony-stimulating factors. Continue IV antibiotics.  Discussed with Dr. Virgina Norfolk, preferred patient to continue IV antibiotics until [place] is above 500. Continue Peridex swish and spit  twice daily for mucositis prophylaxis.  #Anemia and thrombocytopenia, counts has improved.  Secondary to chemotherapy.  Continue to monitor. #CT showed segmental infectious or inflammatory colitis most notable in the proximal to mid transverse and distal descending colon.  Antibiotics have been changed to meropenem.  Thank you for allowing me to participate in the care of this patient.   Earlie Server, MD, PhD Hematology Oncology [facility] Pager- [phone] 05/29/2019

## 2019-05-29 NOTE — Progress Notes (Signed)
PROGRESS NOTE    Darryl White  White DOB: 1940/03/20 DOA: 05/27/2019 PCP: Pleas Koch, NP   Brief Narrative:  Darryl White is a 79 y.o. 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  Last chemotherapy was 3 weeks ago and last radiation therapy was on 05/26/19.  Patient was sent to the emergency room from the oncology clinic due to concerns of neutropenic fever.  He had a temperature of 100.25F and an ANC of 0.1.  He had been started empirically on Levaquin by his oncologist for low-grade fever last week. At the oncologist office patient was noted to be tachycardic with heart rate of 101, pulse oximetry was 87% on room air requiring oxygen supplementation at 2 L. Patient complains of a cough productive of white phlegm, as well as left sided chest pain with radiation to the right side of his chest. He was found to have white cell count of 0.6 and an ANC of 0.1.  Admitted for neutropenic fever.  Subjective: He was feeling better when seen today.  No new complaints.  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   Patient has pancytopenia secondary to chemotherapy and radiation.  Remained afebrile since admission.  His last chemotherapy was 3 weeks ago and last radiation was on 05/26/2019.  Oncology is on board and they will avoid colony stimulating factor at this time due to increased risk of pneumonitis with radiation. CTA done with elevated D-dimer and hypoxia was negative for PE, interval improvement in his lymphadenopathy and  lung mass.  CT abdomen and pelvis was concerning for colitis. UA quite bland.  Blood culture negative so far. Clinically improving with some improvement in cell count.  Oncology wants him to stay on IV meropenem till his absolute neutrophil count rises above 500. -Continue with meropenem. -Monitor cell counts.  Paroxysmal atrial fibrillation.  Currently in sinus rhythm. -Eliquis is on hold due to thrombocytopenia and recurrent epistaxis. -Continue to monitor.  COPD.  No concern for any acute exacerbation. -Continue home as needed bronchodilators.  GERD.  Patient is having some GERD symptoms.  He does not want to take sucralfate as it causing difficulty swallowing and associated with pain and some nausea. -Discontinue sucralfate. -Continue with Pepcid. -PPI can be added if needed.  Objective: Vitals:   05/28/19 1554 05/28/19 2323 05/29/19 0804 05/29/19 1242  BP: 112/77 127/81 113/79   Pulse: 94 90 83   Resp: 18 17 20    Temp: 98 F (36.7 C) 98.5 F (36.9 C) 98.6 F (37 C)   TempSrc: Oral Oral Oral   SpO2: 97% 92% 93% 94%  Weight:      Height:        Intake/Output Summary (Last 24 hours) at 05/29/2019 1619 Last data filed at 05/29/2019 1540 Gross per 24 hour  Intake 379.95 ml  Output 525 ml  Net -145.05 ml   Filed Weights   05/27/19 1150  Weight: 73.5 kg    Examination:  General exam: Appears calm and comfortable  Respiratory system: Clear bilaterally. Respiratory effort normal. Cardiovascular system: S1 &  S2 heard, RRR. No JVD, murmurs, rubs, gallops or clicks. Gastrointestinal system: Soft, nontender, nondistended, bowel sounds positive. Central nervous system: Alert and oriented. No focal neurological deficits.Symmetric 5 x 5 power. Extremities: No edema, no cyanosis, pulses intact and symmetrical. Psychiatry: Judgement and insight appear normal. Mood & affect appropriate.    DVT prophylaxis: SCDs. Code Status: Full Family Communication: Wife was updated at  bedside. Disposition Plan: Pending improvement.  Oncology wants him to stay on meropenem until absolute neutrophil count rises above 500.  Patient is high risk for deterioration due to his immunocompromise state.  Consultants:   Oncology  Procedures:  Antimicrobials:  Meropenem  Data Reviewed: I have personally reviewed following labs and imaging studies  CBC: Recent Labs  Lab 05/27/19 1052 05/27/19 1152 05/28/19 0549 05/29/19 0405  WBC 0.5* 0.6* 0.7* 1.2*  NEUTROABS 0.1* 0.1*  --  0.3*  HGB 9.8* 10.5* 8.3* 8.9*  HCT 28.6* 31.0* 24.4* 25.7*  MCV 105.9* 107.3* 107.0* 106.6*  PLT 50* 58* 47* 70*   Basic Metabolic Panel: Recent Labs  Lab 05/27/19 1052 05/27/19 1152 05/28/19 0549 05/29/19 0405  NA 134* 134* 132* 135  K 4.1 3.7 3.3* 3.7  CL 101 100 105 106  CO2 26 25 22 25   GLUCOSE 119* 118* 105* 92  BUN 20 20 14 13   CREATININE 1.18 1.12 0.84 0.91  CALCIUM 9.0 9.1 7.9* 8.4*  MG  --   --   --  1.9  PHOS  --   --   --  1.9*   GFR: Estimated Creatinine Clearance: 60.4 mL/min (by C-G formula based on SCr of 0.91 mg/dL). Liver Function Tests: Recent Labs  Lab 05/27/19 1052 05/27/19 1152  AST 25 27  ALT 18 16  ALKPHOS 78 74  BILITOT 0.8 0.6  PROT 6.4* 6.8  ALBUMIN 3.2* 3.4*   No results for input(s): LIPASE, AMYLASE in the last 168 hours. No results for input(s): AMMONIA in the last 168 hours. Coagulation Profile: Recent Labs  Lab 05/27/19 1152  INR 1.0   Cardiac Enzymes: No results for input(s): CKTOTAL, CKMB, CKMBINDEX, TROPONINI in the last 168 hours. BNP (last 3 results) No results for input(s): PROBNP in the last 8760 hours. HbA1C: No results for input(s): HGBA1C in the last 72 hours. CBG: No results for input(s): GLUCAP in the last 168 hours. Lipid Profile: No results for input(s): CHOL, HDL, LDLCALC, TRIG, CHOLHDL, LDLDIRECT in the last 72 hours. Thyroid Function Tests: No results for input(s): TSH, T4TOTAL, FREET4, T3FREE, THYROIDAB in the  last 72 hours. Anemia Panel: Recent Labs    05/29/19 0405  VITAMINB12 496   Sepsis Labs: Recent Labs  Lab 05/27/19 1052 05/27/19 1152 05/27/19 1423 05/28/19 0549 05/29/19 0405  PROCALCITON  --   --   --  <0.10 <0.10  LATICACIDVEN 1.2 1.2 0.9  --   --     Recent Results (from the past 240 hour(s))  Culture, blood (Routine x 2)     Status: None (Preliminary result)   Collection Time: 05/27/19 11:52 AM   Specimen: BLOOD  Result Value Ref Range Status   Specimen Description BLOOD RIGHT ANTECUBITAL  Final   Special Requests   Final    BOTTLES DRAWN AEROBIC AND ANAEROBIC Blood Culture adequate volume   Culture   Final    NO GROWTH 2 DAYS Performed at [facility], [address] [place], [place] [address]    Report Status PENDING  Incomplete  Culture, blood (Routine x 2)  Status: None (Preliminary result)   Collection Time: 05/27/19  1:09 PM   Specimen: BLOOD  Result Value Ref Range Status   Specimen Description BLOOD LEFT ANTECUBITAL  Final   Special Requests   Final    BOTTLES DRAWN AEROBIC AND ANAEROBIC Blood Culture adequate volume   Culture   Final    NO GROWTH 2 DAYS Performed at [facility], [address], [place], [place] [address]    Report Status PENDING  Incomplete  Respiratory Panel by RT PCR (Flu A&B, Covid) - Nasopharyngeal Swab     Status: None   Collection Time: 05/27/19  1:09 PM   Specimen: Nasopharyngeal Swab  Result Value Ref Range Status   SARS Coronavirus 2 by RT PCR NEGATIVE NEGATIVE Final    Comment: (NOTE) SARS-CoV-2 target nucleic acids are NOT DETECTED. The SARS-CoV-2 RNA is generally detectable in upper respiratoy specimens during the acute phase of infection. The lowest concentration of SARS-CoV-2 viral copies this assay can detect is 131 copies/mL. A negative result does not preclude SARS-Cov-2 infection and should not be used as the sole basis for treatment or other patient management decisions. A negative  result may occur with  improper specimen collection/handling, submission of specimen other than nasopharyngeal swab, presence of viral mutation(s) within the areas targeted by this assay, and inadequate number of viral copies (<131 copies/mL). A negative result must be combined with clinical observations, patient history, and epidemiological information. The expected result is Negative. Fact Sheet for Patients:  PinkCheek.be Fact Sheet for Healthcare Providers:  GravelBags.it This test is not yet ap proved or cleared by the [place] FDA and  has been authorized for detection and/or diagnosis of SARS-CoV-2 by FDA under an Emergency Use Authorization (EUA). This EUA will remain  in effect (meaning this test can be used) for the duration of the COVID-19 declaration under Section 564(b)(1) of the Act, 21 U.S.C. section 360bbb-3(b)(1), unless the authorization is terminated or revoked sooner.    Influenza A by PCR NEGATIVE NEGATIVE Final   Influenza B by PCR NEGATIVE NEGATIVE Final    Comment: (NOTE) The Xpert Xpress SARS-CoV-2/FLU/RSV assay is intended as an aid in  the diagnosis of influenza from Nasopharyngeal swab specimens and  should not be used as a sole basis for treatment. Nasal washings and  aspirates are unacceptable for Xpert Xpress SARS-CoV-2/FLU/RSV  testing. Fact Sheet for Patients: PinkCheek.be Fact Sheet for Healthcare Providers: GravelBags.it This test is not yet approved or cleared by the [place] FDA and  has been authorized for detection and/or diagnosis of SARS-CoV-2 by  FDA under an Emergency Use Authorization (EUA). This EUA will remain  in effect (meaning this test can be used) for the duration of the  Covid-19 declaration under Section 564(b)(1) of the Act, 21  U.S.C. section 360bbb-3(b)(1), unless the authorization is  terminated or  revoked. Performed at [facility], [address] [place], [place] [address]   Culture, blood (x 2)     Status: None (Preliminary result)   Collection Time: 05/27/19  4:48 PM   Specimen: BLOOD  Result Value Ref Range Status   Specimen Description BLOOD LEFT ANTECUBITAL  Final   Special Requests   Final    BOTTLES DRAWN AEROBIC AND ANAEROBIC Blood Culture adequate volume   Culture   Final    NO GROWTH 2 DAYS Performed at [facility], [address], [place],  [address]    Report Status PENDING  Incomplete  Culture, blood (x  2)     Status: None (Preliminary result)   Collection Time: 05/27/19  4:54 PM   Specimen: BLOOD  Result Value Ref Range Status   Specimen Description BLOOD BLOOD RIGHT HAND  Final   Special Requests   Final    BOTTLES DRAWN AEROBIC ONLY Blood Culture adequate volume   Culture   Final    NO GROWTH 2 DAYS Performed at [facility], [address], [place], [place] [address]    Report Status PENDING  Incomplete     Radiology Studies: CT ABDOMEN PELVIS W CONTRAST  Result Date: 05/28/2019 CLINICAL DATA:  Fever of unknown origin.  Small cell lung carcinoma. EXAM: CT ABDOMEN AND PELVIS WITH CONTRAST TECHNIQUE: Multidetector CT imaging of the abdomen and pelvis was performed using the standard protocol following bolus administration of intravenous contrast. CONTRAST:  100 mL OMNIPAQUE IOHEXOL 300 MG/ML  SOLN COMPARISON:  CT chest 05/27/2019.  PET CT scan 03/20/2019. FINDINGS: Lower chest: Lung bases again demonstrate severe emphysematous disease and basilar fibrosis. Small right pleural effusion noted. No left pleural effusion or pericardial effusion. Hepatobiliary: No focal liver abnormality is seen. No gallstones, gallbladder wall thickening, or biliary dilatation. Pancreas: Unremarkable. No pancreatic ductal dilatation or surrounding inflammatory changes. Spleen: Normal in size without focal abnormality. Adrenals/Urinary Tract:  Adrenal glands are unremarkable. Small bilateral renal cysts are unchanged. The kidneys otherwise appear normal. Ureters and urinary bladder are unremarkable. Bladder is unremarkable. Stomach/Bowel: There are areas of marked wall thickening of the colon with surrounding stranding, most notable in the proximal to mid transverse and mid to distal descending consistent with colitis. The appendix has been removed. The patient has a small hiatal hernia. The stomach is otherwise unremarkable. Small bowel appears normal. Vascular/Lymphatic: Atherosclerosis is noted. The descending abdominal aorta measures up to 2.7 cm in diameter, unchanged. Reproductive: Prostate is unremarkable. Other: None. Musculoskeletal: No acute or focal abnormality. IMPRESSION: The examination is positive for segmental infectious or inflammatory colitis most notable in the proximal to mid transverse and distal descending. No other acute abnormality. Severe emphysema. Small right pleural effusion. Atherosclerosis. Small hiatal hernia. Electronically Signed   By: Inge Rise M.D.   On: 05/28/2019 14:04    Scheduled Meds: . brimonidine  1 drop Both Eyes BID  . chlorhexidine  15 mL Mouth/Throat BID  . cholecalciferol  2,000 Units Oral Q lunch  . famotidine  20 mg Oral BID  . feeding supplement (ENSURE ENLIVE)  237 mL Oral TID BM  . fenofibrate  160 mg Oral Daily  . folic acid  1 mg Oral Daily  . gabapentin  100 mg Oral TID  . mometasone-formoterol  2 puff Inhalation BID  . multivitamin with minerals  1 tablet Oral Daily  . phosphorus  500 mg Oral BID  . sodium chloride flush  3 mL Intravenous Q12H   Continuous Infusions: . sodium chloride Stopped (05/29/19 1500)  . meropenem (MERREM) IV Stopped (05/29/19 1540)     LOS: 2 days   Time spent: 40 minutes.  Lorella Nimrod, MD [facility]  If 7PM-7AM, please contact night-coverage [email or web address]  05/29/2019, 4:19 PM   This record has been created using Therapist, sports. Errors have been sought and corrected,but may not always be located. Such creation errors do not reflect on the standard of care.

## 2019-05-30 ENCOUNTER — Ambulatory Visit: Payer: Medicare PPO

## 2019-05-30 LAB — CBC WITH DIFFERENTIAL/PLATELET
Abs Immature Granulocytes: 0.07 10*3/uL (ref 0.00–0.07)
Basophils Absolute: 0 10*3/uL (ref 0.0–0.1)
Basophils Relative: 1 %
Eosinophils Absolute: 0 10*3/uL (ref 0.0–0.5)
Eosinophils Relative: 0 %
HCT: 26.8 % — ABNORMAL LOW (ref 39.0–52.0)
Hemoglobin: 9.1 g/dL — ABNORMAL LOW (ref 13.0–17.0)
Immature Granulocytes: 5 %
Lymphocytes Relative: 32 %
Lymphs Abs: 0.5 10*3/uL — ABNORMAL LOW (ref 0.7–4.0)
MCH: 36.3 pg — ABNORMAL HIGH (ref 26.0–34.0)
MCHC: 34 g/dL (ref 30.0–36.0)
MCV: 106.8 fL — ABNORMAL HIGH (ref 80.0–100.0)
Monocytes Absolute: 0.4 10*3/uL (ref 0.1–1.0)
Monocytes Relative: 27 %
Neutro Abs: 0.6 10*3/uL — ABNORMAL LOW (ref 1.7–7.7)
Neutrophils Relative %: 35 %
Platelets: 99 10*3/uL — ABNORMAL LOW (ref 150–400)
RBC: 2.51 MIL/uL — ABNORMAL LOW (ref 4.22–5.81)
RDW: 16.2 % — ABNORMAL HIGH (ref 11.5–15.5)
Smear Review: NORMAL
WBC: 1.6 10*3/uL — ABNORMAL LOW (ref 4.0–10.5)
nRBC: 0 % (ref 0.0–0.2)

## 2019-05-30 LAB — BASIC METABOLIC PANEL
Anion gap: 5 (ref 5–15)
BUN: 17 mg/dL (ref 8–23)
CO2: 25 mmol/L (ref 22–32)
Calcium: 8.3 mg/dL — ABNORMAL LOW (ref 8.9–10.3)
Chloride: 105 mmol/L (ref 98–111)
Creatinine, Ser: 1.03 mg/dL (ref 0.61–1.24)
GFR calc Af Amer: >60 mL/min (ref >60)
GFR calc non Af Amer: >60 mL/min (ref >60)
Glucose, Bld: 97 mg/dL (ref 70–99)
Potassium: 3.8 mmol/L (ref 3.5–5.1)
Sodium: 135 mmol/L (ref 135–145)

## 2019-05-30 LAB — PATHOLOGIST SMEAR REVIEW

## 2019-05-30 LAB — PHOSPHORUS: Phosphorus: 3.1 mg/dL (ref 2.5–4.6)

## 2019-05-30 LAB — PROCALCITONIN: Procalcitonin: 0.11 ng/mL

## 2019-05-30 MED ORDER — VITAMIN D3 50 MCG (2000 UT) PO CAPS: 2000.0000 [IU] | ORAL_CAPSULE | Freq: Every day | ORAL | 1 refills | Status: AC

## 2019-05-30 MED ORDER — CEPHALEXIN 500 MG PO CAPS: 500.0000 mg | ORAL_CAPSULE | Freq: Four times a day (QID) | ORAL | 0 refills | Status: AC

## 2019-05-30 MED ORDER — METRONIDAZOLE 500 MG PO TABS: 500.0000 mg | ORAL_TABLET | Freq: Three times a day (TID) | ORAL | 0 refills | Status: AC

## 2019-05-30 MED ORDER — CEPHALEXIN 500 MG PO CAPS
500.0000 mg | ORAL_CAPSULE | Freq: Four times a day (QID) | ORAL | Status: DC
  Administered 2019-05-30: 500 mg via ORAL
  Filled 2019-05-30: qty 1

## 2019-05-30 MED ORDER — CHLORHEXIDINE GLUCONATE CLOTH 2 % EX PADS
6.0000 | MEDICATED_PAD | Freq: Every day | CUTANEOUS | Status: DC
  Administered 2019-05-30: 6 via TOPICAL

## 2019-05-30 MED ORDER — K PHOS MONO-SOD PHOS DI & MONO 155-852-130 MG PO TABS: 500.0000 mg | ORAL_TABLET | Freq: Two times a day (BID) | ORAL | 0 refills | Status: AC

## 2019-05-30 MED ORDER — METRONIDAZOLE 500 MG PO TABS
500.0000 mg | ORAL_TABLET | Freq: Three times a day (TID) | ORAL | Status: DC
  Administered 2019-05-30: 500 mg via ORAL
  Filled 2019-05-30: qty 1

## 2019-05-30 MED ORDER — HEPARIN SOD (PORK) LOCK FLUSH 100 UNIT/ML IV SOLN
500.0000 [IU] | Freq: Once | INTRAVENOUS | Status: AC
  Administered 2019-05-30: 500 [IU] via INTRAVENOUS
  Filled 2019-05-30 (×2): qty 5

## 2019-05-30 NOTE — Consult Note (Signed)
Pharmacy Antibiotic Note  Darryl White is a 79 y.o. male admitted on 05/27/2019 with Febrile Neutropenia.  Pharmacy has been consulted for Meropenem dosing.  Plan: Continue Meropenem 1g IV q8h based on current renal function   Height: 5\' 6"  (167.6 cm) Weight: 73.5 kg (162 lb) IBW/kg (Calculated) : 63.8  Temp (24hrs), Avg:98.5 F (36.9 C), Min:98 F (36.7 C), Max:98.9 F (37.2 C)  Recent Labs  Lab 05/27/19 1052 05/27/19 1152 05/27/19 1423 05/28/19 0549 05/29/19 0405 05/30/19 0522  WBC 0.5* 0.6*  --  0.7* 1.2* 1.6*  CREATININE 1.18 1.12  --  0.84 0.91 1.03  LATICACIDVEN 1.2 1.2 0.9  --   --   --     Estimated Creatinine Clearance: 53.3 mL/min (by C-G formula based on SCr of 1.03 mg/dL).    Allergies  Allergen Reactions  . Sulfa Antibiotics Hives and Itching  . Penicillins Hives and Palpitations    Did it involve swelling of the face/tongue/throat, SOB, or low BP? No Did it involve sudden or severe rash/hives, skin peeling, or any reaction on the inside of your mouth or nose? No Did you need to seek medical attention at a hospital or doctor's office? Unknown When did it last happen?childhood allergy If all above answers are "NO", may proceed with cephalosporin use.     Antimicrobials this admission: 4/6 Aztreonam/Metronidazole/Vancomycin x 1 4/6 Meropenem >>  Dose adjustments this admission: Meropenem 1g q12h to 1g q8h per improved renal function  Microbiology results: BCx x4 4/6 NGTD 4/6 COVID/Flu Neg  Thank you for allowing pharmacy to be a part of this patient's care.  Rocky Morel, PharmD, BCPS Clinical Pharmacist 05/30/2019 8:19 AM

## 2019-05-30 NOTE — Progress Notes (Signed)
Hematology/Oncology Progress Note [facility] Telephone:([phone] [phone]  Patient Care Team: Pleas Koch, NP as PCP - General (Internal Medicine) Deboraha Sprang, MD as Consulting Physician (Cardiology) Marlowe Sax, MD as Referring Physician (Internal Medicine) Birder Robson, MD as Referring Physician (Ophthalmology) Oneta Rack, MD as Referring Physician (Dermatology) Salli Real, DDS as Referring Physician (Dentistry) Telford Nab, RN as Oncology Nurse Navigator Earlie Server, MD as Consulting Physician (Hematology and Oncology)   Name of the patient: Darryl White  [id number]  1940/02/29  Date of visit: 05/30/19   INTERVAL HISTORY-  No acute overnight events.  Patient denies any breathing difficulties.  Still fatigued.  Review of systems- Review of Systems  Constitutional: Positive for appetite change and fatigue.  Respiratory: Negative for cough and shortness of breath.   Cardiovascular: Negative for leg swelling.  Gastrointestinal: Negative for abdominal pain and diarrhea.  Genitourinary: Negative for frequency.   Musculoskeletal: Negative for back pain.  Skin: Negative for rash and wound.  Neurological: Negative for dizziness.  Hematological: Negative for adenopathy.  Psychiatric/Behavioral: Negative for confusion.    Allergies  Allergen Reactions  . Sulfa Antibiotics Hives and Itching  . Penicillins Hives and Palpitations    Did it involve swelling of the face/tongue/throat, SOB, or low BP? No Did it involve sudden or severe rash/hives, skin peeling, or any reaction on the inside of your mouth or nose? No Did you need to seek medical attention at a hospital or doctor's office? Unknown When did it last happen?childhood allergy If all above answers are "NO", may proceed with cephalosporin use.     Patient Active Problem List   Diagnosis Date Noted  . Malnutrition of moderate degree  05/28/2019  . Thrombocytopenia ([place])   . Anemia   . Neutropenic fever ([place]) 05/27/2019  . 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 Malignant neoplasm of right lung ([place]) 04/11/2019  . Goals of care, counseling/discussion 04/11/2019  . Pulmonary nodules 03/13/2019  . GERD (gastroesophageal reflux disease) 03/13/2019  . Decreased renal function 03/04/2018  . COPD with acute exacerbation ([place]) 02/22/2018  . Preventative health care 03/01/2017  . Diverticulitis 06/19/2016  . Skin cancer (melanoma) ([place]) 06/19/2016  . Change in multiple nevi 05/03/2016  . Personal history of tobacco use, presenting hazards to health 03/17/2016  . Rheumatoid arthritis ([place]) 02/29/2016  . Osteoarthritis 10/07/2015  . Atrial fibrillation ([place]) 10/12/2014  . Hypotension 10/12/2014  . HLD (hyperlipidemia) 01/27/2014  . History of syncope 01/27/2014  . History of colonic polyps 01/27/2014  . Cardiac pacemaker 01/27/2014  . Hypertriglyceridemia 01/27/2014  . Nocturia associated with benign prostatic hypertrophy 01/27/2014  . Tobacco abuse 01/27/2014     Past Medical History:  Diagnosis Date  . Arthritis 2019   dx of osteoarthritis and rhemautoid arthritis  . Cancer ([place])    lung  . Cataract 2019   left eye  . Chicken pox   . Collagen vascular disease ([place])   . 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 COPD (chronic obstructive pulmonary disease) ([place])   . Diverticulitis   . GERD (gastroesophageal reflux disease)   . 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 History of hiatal hernia   . History of stomach ulcers   . Hyperlipidemia   . Lymphocytic colitis   . Orthostatic hypotension   . Osteoarthritis   . Pancreatitis   . Paroxysmal atrial fibrillation (HCC)   . Presence of  permanent cardiac pacemaker   . Small cell lung cancer ([place]) 04/15/2019     Past Surgical History:  Procedure Laterality Date  . APPENDECTOMY    . BACK SURGERY     bone chip lasered  .  CARDIAC CATHETERIZATION    . CATARACT EXTRACTION W/ INTRAOCULAR LENS IMPLANT Right   . COLON SURGERY  1990's   12" removed of large intestine  . EYE SURGERY    . INSERT / REPLACE / [place]  . large intestine block    . PACEMAKER INSERTION     MDT dual chamber pacemaker  . PORTA CATH INSERTION N/A 04/17/2019   Procedure: PORTA CATH INSERTION;  Surgeon: Algernon Huxley, MD;  Location: [facility];  Service: Cardiovascular;  Laterality: N/A;  . PPM GENERATOR CHANGEOUT N/A 12/02/2018   Procedure: PPM GENERATOR CHANGEOUT;  Surgeon: Deboraha Sprang, MD;  Location: [facility];  Service: Cardiovascular;  Laterality: N/A;  . TONSILLECTOMY AND ADENOIDECTOMY    . VIDEO BRONCHOSCOPY WITH ENDOBRONCHIAL ULTRASOUND N/A 04/07/2019   Procedure: VIDEO BRONCHOSCOPY WITH ENDOBRONCHIAL ULTRASOUND;  Surgeon: Tyler Pita, MD;  Location: [facility];  Service: Pulmonary;  Laterality: N/A;    Social History   Socioeconomic History  . Marital status: Married    Spouse name: Not on file  . Number of children: 2  . Years of education: Not on file  . Highest education level: Not on file  Occupational History  . Occupation: Retired  Tobacco Use  . Smoking status: Former Smoker    Packs/day: 1.00    Years: 60.00    Pack years: 60.00    Types: Cigarettes    Quit date: 04/29/2017    Years since quitting: 2.0  . Smokeless tobacco: Never Used  Substance and Sexual Activity  . Alcohol use: Yes    Alcohol/week: 5.0 standard drinks    Types: 5 Glasses of wine per week  . Drug use: No  . Sexual activity: Never  Other Topics Concern  . Not on file  Social History Narrative   Married.   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      Does not have a living will.   Desires CPR, does not want prolonged life support if futile.   Social Determinants of Health   Financial Resource Strain: Low Risk   . Difficulty of Paying Living Expenses: Not hard at all  Food  Insecurity: No Food Insecurity  . Worried About Charity fundraiser in the Last Year: Never true  . Ran Out of Food in the Last Year: Never true  Transportation Needs: No Transportation Needs  . Lack of Transportation (Medical): No  . Lack of Transportation (Non-Medical): No  Physical Activity: Sufficiently Active  . Days of Exercise per Week: 7 days  . Minutes of Exercise per Session: 60 min  Stress: No Stress Concern Present  . Feeling of Stress : Not at all  Social Connections:   . Frequency of Communication with Friends and Family:   . Frequency of Social Gatherings with Friends and Family:   . Attends Religious Services:   . Active Member of Clubs or Organizations:   . Attends Archivist Meetings:   Marland Kitchen Marital Status:   Intimate Partner Violence: Not At Risk  . Fear of Current or Ex-Partner: No  . Emotionally Abused: No  . Physically Abused: No  . Sexually Abused: No     Family History  Problem Relation Age of  Onset  . Lung cancer Mother   . Heart disease Father   . Alzheimer's disease Father      Current Facility-Administered Medications:  .  0.9 %  sodium chloride infusion, 250 mL, Intravenous, PRN, Agbata, Tochukwu, MD, Last Rate: 10 mL/hr at 05/30/19 0637, 250 mL at 05/30/19 0637 .  acetaminophen (TYLENOL) tablet 650 mg, 650 mg, Oral, Q6H PRN **OR** acetaminophen (TYLENOL) suppository 650 mg, 650 mg, Rectal, Q6H PRN, Agbata, Tochukwu, MD .  brimonidine (ALPHAGAN) 0.15 % ophthalmic solution 1 drop, 1 drop, Both Eyes, BID, Agbata, Tochukwu, MD, 1 drop at 05/30/19 0906 .  cephALEXin (KEFLEX) capsule 500 mg, 500 mg, Oral, Q6H, Lorella Nimrod, MD, 500 mg at 05/30/19 1134 .  chlorhexidine (PERIDEX) 0.12 % solution 15 mL, 15 mL, Mouth/Throat, BID, Earlie Server, MD, 15 mL at 05/30/19 0905 .  Chlorhexidine Gluconate Cloth 2 % PADS 6 each, 6 each, Topical, Daily, Lorella Nimrod, MD, 6 each at 05/30/19 1135 .  cholecalciferol (VITAMIN D3) tablet 2,000 Units, 2,000 Units,  Oral, Q lunch, Agbata, Tochukwu, MD, 2,000 Units at 05/29/19 1201 .  famotidine (PEPCID) tablet 20 mg, 20 mg, Oral, BID, Agbata, Tochukwu, MD, 20 mg at 05/30/19 0905 .  feeding supplement (ENSURE ENLIVE) (ENSURE ENLIVE) liquid 237 mL, 237 mL, Oral, TID BM, Lorella Nimrod, MD, 237 mL at 05/30/19 1135 .  fenofibrate tablet 160 mg, 160 mg, Oral, Daily, Agbata, Tochukwu, MD, 160 mg at 05/30/19 0905 .  folic acid (FOLVITE) tablet 1 mg, 1 mg, Oral, Daily, Agbata, Tochukwu, MD, 1 mg at 05/30/19 0905 .  gabapentin (NEURONTIN) capsule 100 mg, 100 mg, Oral, TID, Agbata, Tochukwu, MD, 100 mg at 05/30/19 0904 .  ipratropium (ATROVENT) nebulizer solution 0.5 mg, 0.5 mg, Nebulization, Q6H PRN, Lorella Nimrod, MD .  lidocaine (LMX) 4 % cream, , Topical, Daily PRN, Lorella Nimrod, MD, 1 application at 24/26/83 0855 .  metroNIDAZOLE (FLAGYL) tablet 500 mg, 500 mg, Oral, Q8H, Amin, Sumayya, MD, 500 mg at 05/30/19 1134 .  mometasone-formoterol (DULERA) 100-5 MCG/ACT inhaler 2 puff, 2 puff, Inhalation, BID, Agbata, Tochukwu, MD, 2 puff at 05/30/19 0906 .  multivitamin with minerals tablet 1 tablet, 1 tablet, Oral, Daily, Lorella Nimrod, MD, 1 tablet at 05/30/19 0904 .  ondansetron (ZOFRAN) tablet 4 mg, 4 mg, Oral, Q6H PRN **OR** ondansetron (ZOFRAN) injection 4 mg, 4 mg, Intravenous, Q6H PRN, Agbata, Tochukwu, MD, 4 mg at 05/28/19 1648 .  phosphorus (K PHOS NEUTRAL) tablet 500 mg, 500 mg, Oral, BID, Lorella Nimrod, MD, 500 mg at 05/30/19 0904 .  sodium chloride (OCEAN) 0.65 % nasal spray 1 spray, 1 spray, Each Nare, QHS PRN, Agbata, Tochukwu, MD .  sodium chloride flush (NS) 0.9 % injection 3 mL, 3 mL, Intravenous, Q12H, Agbata, Tochukwu, MD, 3 mL at 05/29/19 2228 .  sodium chloride flush (NS) 0.9 % injection 3 mL, 3 mL, Intravenous, PRN, Agbata, Tochukwu, MD .  traMADol (ULTRAM) tablet 25 mg, 25 mg, Oral, QHS PRN, Agbata, Tochukwu, MD, 25 mg at 05/29/19 2242   Physical exam:  Vitals:   05/29/19 1659 05/29/19 1722  05/29/19 2302 05/30/19 0742  BP: 110/66  120/71 114/63  Pulse: 91  68 72  Resp: 20  17 17   Temp: 98.9 F (37.2 C)  98 F (36.7 C) 98.6 F (37 C)  TempSrc: Oral   Oral  SpO2: 90% 92% 90% 96%  Weight:      Height:       Physical Exam  Constitutional: He is oriented to person,  place, and time. No distress.  HENT:  Head: Normocephalic and atraumatic.  Nose: Nose normal.  Mouth/Throat: Oropharynx is clear and moist. No oropharyngeal exudate.  Eyes: Pupils are equal, round, and reactive to light. EOM are normal. No scleral icterus.  Cardiovascular: Normal rate and regular rhythm.  No murmur heard. Pulmonary/Chest: Effort normal. No respiratory distress.  Crackles on the base bilaterally.  Abdominal: Soft. He exhibits no distension. There is no abdominal tenderness.  Musculoskeletal:        General: No edema. Normal range of motion.     Cervical back: Normal range of motion and neck supple.  Neurological: He is alert and oriented to person, place, and time. No cranial nerve deficit. He exhibits normal muscle tone. Coordination normal.  Skin: Skin is warm and dry. He is not diaphoretic. No erythema.  Psychiatric: Affect normal.       CMP Latest Ref Rng & Units 05/30/2019  Glucose 70 - 99 mg/dL 97  BUN 8 - 23 mg/dL 17  Creatinine 0.61 - 1.24 mg/dL 1.03  Sodium 135 - 145 mmol/L 135  Potassium 3.5 - 5.1 mmol/L 3.8  Chloride 98 - 111 mmol/L 105  CO2 22 - 32 mmol/L 25  Calcium 8.9 - 10.3 mg/dL 8.3(L)  Total Protein 6.5 - 8.1 g/dL -  Total Bilirubin 0.3 - 1.2 mg/dL -  Alkaline Phos 38 - 126 U/L -  AST 15 - 41 U/L -  ALT 0 - 44 U/L -   CBC Latest Ref Rng & Units 05/30/2019  WBC 4.0 - 10.5 K/uL 1.6(L)  Hemoglobin 13.0 - 17.0 g/dL 9.1(L)  Hematocrit 39.0 - 52.0 % 26.8(L)  Platelets 150 - 400 K/uL 99(L)    RADIOGRAPHIC STUDIES: I have personally reviewed the radiological images as listed and agreed with the findings in the report. DG Chest 2 View  Result Date:  05/27/2019 CLINICAL DATA:  Suspected sepsis. EXAM: CHEST - 2 VIEW COMPARISON:  PET-CT 03/20/2019. Chest CT 02/27/2019. Chest x-ray 04/11/2014. FINDINGS: PowerPort catheter noted with tip over the cavoatrial junction. Cardiac pacer stable position. Heart size normal. COPD. Chronic bilateral interstitial prominence noted. Superimposed active pneumonitis cannot be excluded. No focal alveolar infiltrate. No pleural effusion or pneumothorax. IMPRESSION: 1.  PowerPort catheter noted with tip over SVC. 2.  Cardiac pacer stable position.  Heart size normal. 3. COPD. Chronic interstitial disease. Superimposed active pneumonitis cannot be excluded. No focal alveolar infiltrate noted. Electronically Signed   By: Marcello Moores  Register   On: 05/27/2019 12:21   CT ANGIO CHEST PE W OR WO CONTRAST  Result Date: 05/27/2019 CLINICAL DATA:  79 year old male with small cell lung cancer on radiation treatment and chemotherapy. Fever and shortness of breath. EXAM: CT ANGIOGRAPHY CHEST WITH CONTRAST TECHNIQUE: Multidetector CT imaging of the chest was performed using the standard protocol during bolus administration of intravenous contrast. Multiplanar CT image reconstructions and MIPs were obtained to evaluate the vascular anatomy. CONTRAST:  58mL OMNIPAQUE IOHEXOL 350 MG/ML SOLN COMPARISON:  Chest radiograph dated 05/27/2019 and CT dated 02/27/2019 FINDINGS: Cardiovascular: There is no cardiomegaly or pericardial effusion. Coronary vascular calcification primarily involving the LAD. Left pectoral pacemaker device noted. There is advanced atherosclerotic calcification of the thoracic aorta. No aneurysmal dilatation or dissection. There is no CT evidence of pulmonary embolism. Mediastinum/Nodes: Significant interval decrease in the size of the enlarged lymph node seen on the prior CT anterior to the carina now measuring approximately 11 mm in short axis (previously 21 mm). Decrease in the size of subcarinal lymph  node now measuring  approximately 9 mm in short axis. No new adenopathy. There is a small hiatal hernia. There is mild circumferential thickening of the esophagus with infiltration of the surrounding fat which may represent esophagitis. Clinical correlation is recommended. No mediastinal fluid collection. Lungs/Pleura: There is a background of emphysema. The previously seen right upper lobe subpleural lesion is not identified on today's exam. A 2 mm subpleural nodule (63/6) may correspond to the previously seen 6 mm nodule. A 7 mm nodule at the left lung base (71/6) has minimally decreased in size (previously measuring approximately 8 mm). There is trace right pleural effusion, new since the prior CT. No consolidative changes or pneumothorax. The central airways are patent. Upper Abdomen: There is scattered colonic diverticula. Apparent area of colonic thickening and mild pericolonic stranding in the right upper abdomen (series 4 image 100 and coronal series 7, image 22) is suboptimally evaluated due to partial visualization but is concerning for diverticulitis. CT of the abdomen pelvis may provide better evaluation. Musculoskeletal: Osteopenia with mild degenerative changes. No acute osseous pathology. Review of the MIP images confirms the above findings. IMPRESSION: 1. No acute intrathoracic pathology. No CT evidence of pulmonary embolism. 2. Significant interval decrease in the size of the previously seen mediastinal lymph nodes. No new adenopathy. 3. Near complete resolution of the previously seen right upper lobe hypermetabolic subpleural nodule. 4. Thickened appearance of the esophagus concerning for esophagitis. Clinical correlation is recommended. 5. Partially visualized area of colonic thickening and pericolonic stranding in the right upper abdomen, concerning for diverticulitis. CT of the abdomen pelvis may provide better evaluation. 6. Aortic Atherosclerosis (ICD10-I70.0) and Emphysema (ICD10-J43.9). Electronically Signed    By: Anner Crete M.D.   On: 05/27/2019 15:37   CT ABDOMEN PELVIS W CONTRAST  Result Date: 05/28/2019 CLINICAL DATA:  Fever of unknown origin.  Small cell lung carcinoma. EXAM: CT ABDOMEN AND PELVIS WITH CONTRAST TECHNIQUE: Multidetector CT imaging of the abdomen and pelvis was performed using the standard protocol following bolus administration of intravenous contrast. CONTRAST:  100 mL OMNIPAQUE IOHEXOL 300 MG/ML  SOLN COMPARISON:  CT chest 05/27/2019.  PET CT scan 03/20/2019. FINDINGS: Lower chest: Lung bases again demonstrate severe emphysematous disease and basilar fibrosis. Small right pleural effusion noted. No left pleural effusion or pericardial effusion. Hepatobiliary: No focal liver abnormality is seen. No gallstones, gallbladder wall thickening, or biliary dilatation. Pancreas: Unremarkable. No pancreatic ductal dilatation or surrounding inflammatory changes. Spleen: Normal in size without focal abnormality. Adrenals/Urinary Tract: Adrenal glands are unremarkable. Small bilateral renal cysts are unchanged. The kidneys otherwise appear normal. Ureters and urinary bladder are unremarkable. Bladder is unremarkable. Stomach/Bowel: There are areas of marked wall thickening of the colon with surrounding stranding, most notable in the proximal to mid transverse and mid to distal descending consistent with colitis. The appendix has been removed. The patient has a small hiatal hernia. The stomach is otherwise unremarkable. Small bowel appears normal. Vascular/Lymphatic: Atherosclerosis is noted. The descending abdominal aorta measures up to 2.7 cm in diameter, unchanged. Reproductive: Prostate is unremarkable. Other: None. Musculoskeletal: No acute or focal abnormality. IMPRESSION: The examination is positive for segmental infectious or inflammatory colitis most notable in the proximal to mid transverse and distal descending. No other acute abnormality. Severe emphysema. Small right pleural effusion.  Atherosclerosis. Small hiatal hernia. Electronically Signed   By: Inge Rise M.D.   On: 05/28/2019 14:04    Assessment and plan-  #Neutropenic fever, afebrile. Blood culture has no growth for 2 days.  Labs reviewed and discussed with patient. [place] has improved to 0.6 Patient is hemodynamically stable. I recommend discontinue meropenem.  From heme-onc aspect, patient can be discharged with outpatient follow-up with me next week.  He has an appointment with me on 06/04/2019. Advised patient to continue use Peridex oral rinse twice daily.  #Anemia and thrombocytopenia, counts improving.  Chemotherapy-induced.  Thank you for allowing me to participate in the care of this patient.   Earlie Server, MD, PhD Hematology Oncology [facility] Pager- [phone] 05/30/2019

## 2019-05-30 NOTE — Progress Notes (Signed)
Received Md order to discharge patient to home, revived homes meds, prescriptioins, discharge instructions and follow up appointments with patient and patient verbalized understanding

## 2019-05-30 NOTE — Discharge Summary (Signed)
Physician Discharge Summary  Darryl White ZOX:[id number] DOB: 30-Oct-1940 DOA: 05/27/2019  PCP: Pleas Koch, NP  Admit date: 05/27/2019 Discharge date: 05/30/2019  Admitted From: Home Disposition:  Home  Recommendations for Outpatient Follow-up:  1. Follow up with PCP in 1-2 weeks 2. Please obtain BMP/CBC in one week 3. 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  He was initially treated with meropenem due to his allergies.  Urine and blood culture remain negative.  CT abdomen was concerning for colitis.  His symptoms resolved pretty quickly.  He was having some abdominal pain and mild diarrhea.  Patient was discharged home after his absolute neutrophil count was 600 according to advise of his oncologist.  He was discharged on Keflex and Flagyl for 3 more days to complete the treatment of his colitis.  Will follow up with his oncologist.  Patient remained in sinus rhythm while in hospital.  He is not on any anticoagulation for his paroxysmal atrial fibrillation due to thrombocytopenia and recurrent epistaxis.  He will follow-up with his cardiologist for further management.  No concern for any COPD exacerbation during current hospitalization.  We will continue with his home bronchodilators and rest of his meds.  He was complaining of difficulty using sucralfate  stating that it causes pain and nausea and he does not want to take it anymore.  Sucralfate was discontinued due to this complaint.  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 I am sending you home on 3 more days of Keflex and Flagyl, please take it as directed.   Please follow-up with your oncologist according to your scheduled appointment.   Increase activity slowly   Complete by: As directed      Allergies as of 05/30/2019      Reactions   Sulfa Antibiotics Hives, Itching   Penicillins Hives, Palpitations   Did it involve swelling of the face/tongue/throat, SOB, or low BP? No Did it involve sudden or severe rash/hives, skin peeling, or any reaction on the inside of your mouth or nose? No Did you need to seek medical attention at a hospital or doctor's office? Unknown When did it last happen?childhood allergy If all above answers are "NO", may proceed with cephalosporin use.      Medication List    STOP taking these medications   sucralfate 1 g tablet Commonly known as: Carafate     TAKE these medications   albuterol 108 (90 Base) MCG/ACT inhaler Commonly known as: ProAir HFA Inhale 1-2 puffs into the lungs every  6 (six) hours as needed for wheezing or shortness of breath.   antiseptic oral rinse Liqd 15 mLs by Mouth Rinse route as needed for dry mouth.   Breztri Aerosphere 160-9-4.8 MCG/ACT Aero Generic drug: Budeson-Glycopyrrol-Formoterol Inhale 2 puffs into the lungs 2 (two) times daily.   brimonidine 0.15 % ophthalmic solution Commonly known  as: ALPHAGAN Place 1 drop into both eyes 2 (two) times daily.   cephALEXin 500 MG capsule Commonly known as: KEFLEX Take 1 capsule (500 mg total) by mouth every 6 (six) hours for 3 days.   chlorhexidine 0.12 % solution Commonly known as: Peridex Use as directed 15 mLs in the mouth or throat in the morning and at bedtime.   chlorproMAZINE 25 MG tablet Commonly known as: THORAZINE Take 1 tablet (25 mg total) by mouth 3 (three) times daily.   famotidine 20 MG tablet Commonly known as: PEPCID Take 20 mg by mouth 2 (two) times daily.   fenofibrate 145 MG tablet Commonly known as: TRICOR Take 1 tablet (145 mg total) by mouth daily.   folic acid 1 MG tablet Commonly known as: FOLVITE Take 1 mg by mouth daily.   gabapentin 100 MG capsule Commonly known as: NEURONTIN Take 100 mg by mouth 3 (three) times daily.   lidocaine-prilocaine cream Commonly known as: EMLA Apply to affected area once   methotrexate 2.5 MG tablet Take 12.5 mg by mouth every Sunday.   metroNIDAZOLE 500 MG tablet Commonly known as: FLAGYL Take 1 tablet (500 mg total) by mouth every 8 (eight) hours for 3 days.   ondansetron 8 MG tablet Commonly known as: Zofran Take 1 tablet (8 mg total) by mouth 2 (two) times daily as needed for refractory nausea / vomiting. Start on day 3 after carboplatin chemo.   phosphorus 155-852-130 MG tablet Commonly known as: K PHOS NEUTRAL Take 2 tablets (500 mg total) by mouth 2 (two) times daily for 2 days.   prochlorperazine 10 MG tablet Commonly known as: COMPAZINE Take 1 tablet (10 mg total) by mouth every 6 (six) hours as needed (Nausea or vomiting).   sodium chloride 0.65 % Soln nasal spray Commonly known as: OCEAN Place 1 spray into both nostrils at bedtime as needed for congestion.   traMADol 50 MG tablet Commonly known as: ULTRAM Take 25 mg by mouth at bedtime as needed for moderate pain.   Vitamin D3 50 MCG (2000 UT) capsule Take 1 capsule (2,000 Units  total) by mouth daily with lunch.       Allergies  Allergen Reactions  . Sulfa Antibiotics Hives and Itching  . Penicillins Hives and Palpitations    Did it involve swelling of the face/tongue/throat, SOB, or low BP? No Did it involve sudden or severe rash/hives, skin peeling, or any reaction on the inside of your mouth or nose? No Did you need to seek medical attention at a hospital or doctor's office? Unknown When did it last happen?childhood allergy If all above answers are "NO", may proceed with cephalosporin use.     Consultations:  Oncology  Procedures/Studies: DG Chest 2 View  Result Date: 05/27/2019 CLINICAL DATA:  Suspected sepsis. EXAM: CHEST - 2 VIEW COMPARISON:  PET-CT 03/20/2019. Chest CT 02/27/2019. Chest x-ray 04/11/2014. FINDINGS: PowerPort catheter noted with tip over the cavoatrial junction. Cardiac pacer stable position. Heart size normal. COPD. Chronic bilateral interstitial prominence noted. Superimposed active pneumonitis cannot be excluded. No focal alveolar infiltrate. No pleural effusion or pneumothorax. IMPRESSION: 1.  PowerPort catheter noted with tip over  SVC. 2.  Cardiac pacer stable position.  Heart size normal. 3. COPD. Chronic interstitial disease. Superimposed active pneumonitis cannot be excluded. No focal alveolar infiltrate noted. Electronically Signed   By: Marcello Moores  Register   On: 05/27/2019 12:21   CT ANGIO CHEST PE W OR WO CONTRAST  Result Date: 05/27/2019 CLINICAL DATA:  79 year old male with small cell lung cancer on radiation treatment and chemotherapy. Fever and shortness of breath. EXAM: CT ANGIOGRAPHY CHEST WITH CONTRAST TECHNIQUE: Multidetector CT imaging of the chest was performed using the standard protocol during bolus administration of intravenous contrast. Multiplanar CT image reconstructions and MIPs were obtained to evaluate the vascular anatomy. CONTRAST:  97mL OMNIPAQUE IOHEXOL 350 MG/ML SOLN COMPARISON:  Chest radiograph dated  05/27/2019 and CT dated 02/27/2019 FINDINGS: Cardiovascular: There is no cardiomegaly or pericardial effusion. Coronary vascular calcification primarily involving the LAD. Left pectoral pacemaker device noted. There is advanced atherosclerotic calcification of the thoracic aorta. No aneurysmal dilatation or dissection. There is no CT evidence of pulmonary embolism. Mediastinum/Nodes: Significant interval decrease in the size of the enlarged lymph node seen on the prior CT anterior to the carina now measuring approximately 11 mm in short axis (previously 21 mm). Decrease in the size of subcarinal lymph node now measuring approximately 9 mm in short axis. No new adenopathy. There is a small hiatal hernia. There is mild circumferential thickening of the esophagus with infiltration of the surrounding fat which may represent esophagitis. Clinical correlation is recommended. No mediastinal fluid collection. Lungs/Pleura: There is a background of emphysema. The previously seen right upper lobe subpleural lesion is not identified on today's exam. A 2 mm subpleural nodule (63/6) may correspond to the previously seen 6 mm nodule. A 7 mm nodule at the left lung base (71/6) has minimally decreased in size (previously measuring approximately 8 mm). There is trace right pleural effusion, new since the prior CT. No consolidative changes or pneumothorax. The central airways are patent. Upper Abdomen: There is scattered colonic diverticula. Apparent area of colonic thickening and mild pericolonic stranding in the right upper abdomen (series 4 image 100 and coronal series 7, image 22) is suboptimally evaluated due to partial visualization but is concerning for diverticulitis. CT of the abdomen pelvis may provide better evaluation. Musculoskeletal: Osteopenia with mild degenerative changes. No acute osseous pathology. Review of the MIP images confirms the above findings. IMPRESSION: 1. No acute intrathoracic pathology. No CT evidence  of pulmonary embolism. 2. Significant interval decrease in the size of the previously seen mediastinal lymph nodes. No new adenopathy. 3. Near complete resolution of the previously seen right upper lobe hypermetabolic subpleural nodule. 4. Thickened appearance of the esophagus concerning for esophagitis. Clinical correlation is recommended. 5. Partially visualized area of colonic thickening and pericolonic stranding in the right upper abdomen, concerning for diverticulitis. CT of the abdomen pelvis may provide better evaluation. 6. Aortic Atherosclerosis (ICD10-I70.0) and Emphysema (ICD10-J43.9). Electronically Signed   By: Anner Crete M.D.   On: 05/27/2019 15:37   CT ABDOMEN PELVIS W CONTRAST  Result Date: 05/28/2019 CLINICAL DATA:  Fever of unknown origin.  Small cell lung carcinoma. EXAM: CT ABDOMEN AND PELVIS WITH CONTRAST TECHNIQUE: Multidetector CT imaging of the abdomen and pelvis was performed using the standard protocol following bolus administration of intravenous contrast. CONTRAST:  100 mL OMNIPAQUE IOHEXOL 300 MG/ML  SOLN COMPARISON:  CT chest 05/27/2019.  PET CT scan 03/20/2019. FINDINGS: Lower chest: Lung bases again demonstrate severe emphysematous disease and basilar fibrosis. Small right pleural effusion noted. No  left pleural effusion or pericardial effusion. Hepatobiliary: No focal liver abnormality is seen. No gallstones, gallbladder wall thickening, or biliary dilatation. Pancreas: Unremarkable. No pancreatic ductal dilatation or surrounding inflammatory changes. Spleen: Normal in size without focal abnormality. Adrenals/Urinary Tract: Adrenal glands are unremarkable. Small bilateral renal cysts are unchanged. The kidneys otherwise appear normal. Ureters and urinary bladder are unremarkable. Bladder is unremarkable. Stomach/Bowel: There are areas of marked wall thickening of the colon with surrounding stranding, most notable in the proximal to mid transverse and mid to distal descending  consistent with colitis. The appendix has been removed. The patient has a small hiatal hernia. The stomach is otherwise unremarkable. Small bowel appears normal. Vascular/Lymphatic: Atherosclerosis is noted. The descending abdominal aorta measures up to 2.7 cm in diameter, unchanged. Reproductive: Prostate is unremarkable. Other: None. Musculoskeletal: No acute or focal abnormality. IMPRESSION: The examination is positive for segmental infectious or inflammatory colitis most notable in the proximal to mid transverse and distal descending. No other acute abnormality. Severe emphysema. Small right pleural effusion. Atherosclerosis. Small hiatal hernia. Electronically Signed   By: Inge Rise M.D.   On: 05/28/2019 14:04     Subjective: Patient was feeling better when seen today.  No new complaints.  He was eager to go home.  Discharge Exam: Vitals:   05/29/19 2302 05/30/19 0742  BP: 120/71 114/63  Pulse: 68 72  Resp: 17 17  Temp: 98 F (36.7 C) 98.6 F (37 C)  SpO2: 90% 96%   Vitals:   05/29/19 1659 05/29/19 1722 05/29/19 2302 05/30/19 0742  BP: 110/66  120/71 114/63  Pulse: 91  68 72  Resp: 20  17 17   Temp: 98.9 F (37.2 C)  98 F (36.7 C) 98.6 F (37 C)  TempSrc: Oral   Oral  SpO2: 90% 92% 90% 96%  Weight:      Height:        General: Pt is alert, awake, not in acute distress Cardiovascular: RRR, S1/S2 +, no rubs, no gallops Respiratory: CTA bilaterally, no wheezing, no rhonchi Abdominal: Soft, NT, ND, bowel sounds + Extremities: no edema, no cyanosis   The results of significant diagnostics from this hospitalization (including imaging, microbiology, ancillary and laboratory) are listed below for reference.    Microbiology: Recent Results (from the past 240 hour(s))  Culture, blood (Routine x 2)     Status: None (Preliminary result)   Collection Time: 05/27/19 11:52 AM   Specimen: BLOOD  Result Value Ref Range Status   Specimen Description BLOOD RIGHT ANTECUBITAL   Final   Special Requests   Final    BOTTLES DRAWN AEROBIC AND ANAEROBIC Blood Culture adequate volume   Culture   Final    NO GROWTH 2 DAYS Performed at [facility], [address], [place], [place] [address]    Report Status PENDING  Incomplete  Culture, blood (Routine x 2)     Status: None (Preliminary result)   Collection Time: 05/27/19  1:09 PM   Specimen: BLOOD  Result Value Ref Range Status   Specimen Description BLOOD LEFT ANTECUBITAL  Final   Special Requests   Final    BOTTLES DRAWN AEROBIC AND ANAEROBIC Blood Culture adequate volume   Culture   Final    NO GROWTH 2 DAYS Performed at [facility], [address] [place], [place] [address]    Report Status PENDING  Incomplete  Respiratory Panel by RT PCR (Flu A&B, Covid) - Nasopharyngeal Swab     Status: None  Collection Time: 05/27/19  1:09 PM   Specimen: Nasopharyngeal Swab  Result Value Ref Range Status   SARS Coronavirus 2 by RT PCR NEGATIVE NEGATIVE Final    Comment: (NOTE) SARS-CoV-2 target nucleic acids are NOT DETECTED. The SARS-CoV-2 RNA is generally detectable in upper respiratoy specimens during the acute phase of infection. The lowest concentration of SARS-CoV-2 viral copies this assay can detect is 131 copies/mL. A negative result does not preclude SARS-Cov-2 infection and should not be used as the sole basis for treatment or other patient management decisions. A negative result may occur with  improper specimen collection/handling, submission of specimen other than nasopharyngeal swab, presence of viral mutation(s) within the areas targeted by this assay, and inadequate number of viral copies (<131 copies/mL). A negative result must be combined with clinical observations, patient history, and epidemiological information. The expected result is Negative. Fact Sheet for Patients:  PinkCheek.be Fact Sheet for Healthcare Providers:   GravelBags.it This test is not yet ap proved or cleared by the [place] FDA and  has been authorized for detection and/or diagnosis of SARS-CoV-2 by FDA under an Emergency Use Authorization (EUA). This EUA will remain  in effect (meaning this test can be used) for the duration of the COVID-19 declaration under Section 564(b)(1) of the Act, 21 U.S.C. section 360bbb-3(b)(1), unless the authorization is terminated or revoked sooner.    Influenza A by PCR NEGATIVE NEGATIVE Final   Influenza B by PCR NEGATIVE NEGATIVE Final    Comment: (NOTE) The Xpert Xpress SARS-CoV-2/FLU/RSV assay is intended as an aid in  the diagnosis of influenza from Nasopharyngeal swab specimens and  should not be used as a sole basis for treatment. Nasal washings and  aspirates are unacceptable for Xpert Xpress SARS-CoV-2/FLU/RSV  testing. Fact Sheet for Patients: PinkCheek.be Fact Sheet for Healthcare Providers: GravelBags.it This test is not yet approved or cleared by the [place] FDA and  has been authorized for detection and/or diagnosis of SARS-CoV-2 by  FDA under an Emergency Use Authorization (EUA). This EUA will remain  in effect (meaning this test can be used) for the duration of the  Covid-19 declaration under Section 564(b)(1) of the Act, 21  U.S.C. section 360bbb-3(b)(1), unless the authorization is  terminated or revoked. Performed at [facility], [address] [place], [place] [address]   Culture, blood (x 2)     Status: None (Preliminary result)   Collection Time: 05/27/19  4:48 PM   Specimen: BLOOD  Result Value Ref Range Status   Specimen Description BLOOD LEFT ANTECUBITAL  Final   Special Requests   Final    BOTTLES DRAWN AEROBIC AND ANAEROBIC Blood Culture adequate volume   Culture   Final    NO GROWTH 2 DAYS Performed at [facility], [address], [place],  [place] [address]    Report Status PENDING  Incomplete  Culture, blood (x 2)     Status: None (Preliminary result)   Collection Time: 05/27/19  4:54 PM   Specimen: BLOOD  Result Value Ref Range Status   Specimen Description BLOOD BLOOD RIGHT HAND  Final   Special Requests   Final    BOTTLES DRAWN AEROBIC ONLY Blood Culture adequate volume   Culture   Final    NO GROWTH 2 DAYS Performed at [facility], [address] [place], [place] [address]    Report Status PENDING  Incomplete     Labs: BNP (last 3 results) No results for input(s): BNP in  the last 8760 hours. Basic Metabolic Panel: Recent Labs  Lab 05/27/19 1052 05/27/19 1152 05/28/19 0549 05/29/19 0405 05/30/19 0522  NA 134* 134* 132* 135 135  K 4.1 3.7 3.3* 3.7 3.8  CL 101 100 105 106 105  CO2 26 25 22 25 25   GLUCOSE 119* 118* 105* 92 97  BUN 20 20 14 13 17   CREATININE 1.18 1.12 0.84 0.91 1.03  CALCIUM 9.0 9.1 7.9* 8.4* 8.3*  MG  --   --   --  1.9  --   PHOS  --   --   --  1.9* 3.1   Liver Function Tests: Recent Labs  Lab 05/27/19 1052 05/27/19 1152  AST 25 27  ALT 18 16  ALKPHOS 78 74  BILITOT 0.8 0.6  PROT 6.4* 6.8  ALBUMIN 3.2* 3.4*   No results for input(s): LIPASE, AMYLASE in the last 168 hours. No results for input(s): AMMONIA in the last 168 hours. CBC: Recent Labs  Lab 05/27/19 1052 05/27/19 1152 05/28/19 0549 05/29/19 0405 05/30/19 0522  WBC 0.5* 0.6* 0.7* 1.2* 1.6*  NEUTROABS 0.1* 0.1*  --  0.3* 0.6*  HGB 9.8* 10.5* 8.3* 8.9* 9.1*  HCT 28.6* 31.0* 24.4* 25.7* 26.8*  MCV 105.9* 107.3* 107.0* 106.6* 106.8*  PLT 50* 58* 47* 70* 99*   Cardiac Enzymes: No results for input(s): CKTOTAL, CKMB, CKMBINDEX, TROPONINI in the last 168 hours. BNP: Invalid input(s): POCBNP CBG: No results for input(s): GLUCAP in the last 168 hours. D-Dimer No results for input(s): DDIMER in the last 72 hours. Hgb A1c No results for input(s): HGBA1C in the last 72 hours. Lipid Profile No results for  input(s): CHOL, HDL, LDLCALC, TRIG, CHOLHDL, LDLDIRECT in the last 72 hours. Thyroid function studies No results for input(s): TSH, T4TOTAL, T3FREE, THYROIDAB in the last 72 hours.  Invalid input(s): FREET3 Anemia work up Recent Labs    05/29/19 0405  VITAMINB12 496   Urinalysis    Component Value Date/Time   COLORURINE YELLOW (A) 05/27/2019 1205   APPEARANCEUR HAZY (A) 05/27/2019 1205   LABSPEC 1.023 05/27/2019 1205   PHURINE 6.0 05/27/2019 1205   GLUCOSEU NEGATIVE 05/27/2019 1205   HGBUR NEGATIVE 05/27/2019 1205   [place] 05/27/2019 1205   [place] 05/27/2019 1205   PROTEINUR NEGATIVE 05/27/2019 1205   UROBILINOGEN 0.2 10/12/2014 1919   NITRITE NEGATIVE 05/27/2019 1205   LEUKOCYTESUR NEGATIVE 05/27/2019 1205   Sepsis Labs Invalid input(s): PROCALCITONIN,  WBC,  LACTICIDVEN Microbiology Recent Results (from the past 240 hour(s))  Culture, blood (Routine x 2)     Status: None (Preliminary result)   Collection Time: 05/27/19 11:52 AM   Specimen: BLOOD  Result Value Ref Range Status   Specimen Description BLOOD RIGHT ANTECUBITAL  Final   Special Requests   Final    BOTTLES DRAWN AEROBIC AND ANAEROBIC Blood Culture adequate volume   Culture   Final    NO GROWTH 2 DAYS Performed at [facility], [address] [place], [place] [address]    Report Status PENDING  Incomplete  Culture, blood (Routine x 2)     Status: None (Preliminary result)   Collection Time: 05/27/19  1:09 PM   Specimen: BLOOD  Result Value Ref Range Status   Specimen Description BLOOD LEFT ANTECUBITAL  Final   Special Requests   Final    BOTTLES DRAWN AEROBIC AND ANAEROBIC Blood Culture adequate volume   Culture   Final    NO GROWTH 2 DAYS Performed at [facility], [address] [place], [place] [address]    Report Status PENDING  Incomplete  Respiratory Panel by RT PCR (Flu A&B, Covid) - Nasopharyngeal Swab     Status: None   Collection Time:  05/27/19  1:09 PM   Specimen: Nasopharyngeal Swab  Result Value Ref Range Status   SARS Coronavirus 2 by RT PCR NEGATIVE NEGATIVE Final    Comment: (NOTE) SARS-CoV-2 target nucleic acids are NOT DETECTED. The SARS-CoV-2 RNA is generally detectable in upper respiratoy specimens during the acute phase of infection. The lowest concentration of SARS-CoV-2 viral copies this assay can detect is 131 copies/mL. A negative result does not preclude SARS-Cov-2 infection and should not be used as the sole basis for treatment or other patient management decisions. A negative result may occur with  improper specimen collection/handling, submission of specimen other than nasopharyngeal swab, presence of viral mutation(s) within the areas targeted by this assay, and inadequate number of viral copies (<131 copies/mL). A negative result must be combined with clinical observations, patient history, and epidemiological information. The expected result is Negative. Fact Sheet for Patients:  PinkCheek.be Fact Sheet for Healthcare Providers:  GravelBags.it This test is not yet ap proved or cleared by the [place] FDA and  has been authorized for detection and/or diagnosis of SARS-CoV-2 by FDA under an Emergency Use Authorization (EUA). This EUA will remain  in effect (meaning this test can be used) for the duration of the COVID-19 declaration under Section 564(b)(1) of the Act, 21 U.S.C. section 360bbb-3(b)(1), unless the authorization is terminated or revoked sooner.    Influenza A by PCR NEGATIVE NEGATIVE Final   Influenza B by PCR NEGATIVE NEGATIVE Final    Comment: (NOTE) The Xpert Xpress SARS-CoV-2/FLU/RSV assay is intended as an aid in  the diagnosis of influenza from Nasopharyngeal swab specimens and  should not be used as a sole basis for treatment. Nasal washings and  aspirates are unacceptable for Xpert Xpress SARS-CoV-2/FLU/RSV   testing. Fact Sheet for Patients: PinkCheek.be Fact Sheet for Healthcare Providers: GravelBags.it This test is not yet approved or cleared by the [place] FDA and  has been authorized for detection and/or diagnosis of SARS-CoV-2 by  FDA under an Emergency Use Authorization (EUA). This EUA will remain  in effect (meaning this test can be used) for the duration of the  Covid-19 declaration under Section 564(b)(1) of the Act, 21  U.S.C. section 360bbb-3(b)(1), unless the authorization is  terminated or revoked. Performed at [facility], [address] [place], [place] [address]   Culture, blood (x 2)     Status: None (Preliminary result)   Collection Time: 05/27/19  4:48 PM   Specimen: BLOOD  Result Value Ref Range Status   Specimen Description BLOOD LEFT ANTECUBITAL  Final   Special Requests   Final    BOTTLES DRAWN AEROBIC AND ANAEROBIC Blood Culture adequate volume   Culture   Final    NO GROWTH 2 DAYS Performed at [facility], [address] [place], [place] [address]    Report Status PENDING  Incomplete  Culture, blood (x 2)     Status: None (Preliminary result)   Collection Time: 05/27/19  4:54 PM   Specimen: BLOOD  Result Value Ref Range Status   Specimen Description BLOOD BLOOD RIGHT HAND  Final   Special Requests   Final    BOTTLES DRAWN AEROBIC ONLY Blood Culture adequate volume   Culture   Final    NO GROWTH  2 DAYS Performed at [facility], [address], [place], [place] [address]    Report Status PENDING  Incomplete    Time coordinating discharge: Over 30 minutes  SIGNED:  Lorella Nimrod, MD  [facility] 05/30/2019, 1:42 PM  If 7PM-7AM, please contact night-coverage [email or web address]  This record has been created using Systems analyst. Errors have been sought and corrected,but may not always be located. Such creation errors do not reflect  on the standard of care.

## 2019-05-30 NOTE — Care Management Important Message (Signed)
Important Message  Patient Details  Name: Darryl White MRN: [id number] Date of Birth: 1940/12/12   Medicare Important Message Given:  Yes     Juliann Pulse A Justis Dupas 05/30/2019, 11:24 AM

## 2019-06-01 LAB — CULTURE, BLOOD (ROUTINE X 2)
Culture: NO GROWTH
Culture: NO GROWTH
Culture: NO GROWTH
Culture: NO GROWTH
Special Requests: ADEQUATE
Special Requests: ADEQUATE
Special Requests: ADEQUATE
Special Requests: ADEQUATE

## 2019-06-02 ENCOUNTER — Ambulatory Visit: Payer: Medicare PPO

## 2019-06-02 ENCOUNTER — Telehealth: Payer: Self-pay

## 2019-06-02 NOTE — Telephone Encounter (Addendum)
Noted. Agree that it is more important for patient to see his oncologist.  He can follow up with me later once he's recovered.

## 2019-06-02 NOTE — Telephone Encounter (Signed)
Spoken and notified patient of Darryl White comments. Patient verbalized understanding. Patient requested to cancel and I did cancel as requested.

## 2019-06-02 NOTE — Telephone Encounter (Signed)
Transition Care Management Follow-up Telephone Call  Date of discharge and from where: 05/30/2019, [facility]  How have you been since you were released from the hospital? Patient states that he is still running fevers but is feeling better. Has a follow up with his oncologist on 06/04/2019  Any questions or concerns? No   Items Reviewed:  Did the pt receive and understand the discharge instructions provided? Yes   Medications obtained and verified? Yes   Any new allergies since your discharge? No   Dietary orders reviewed? Yes  Do you have support at home? Yes   Functional Questionnaire: (I = Independent and D = Dependent) ADLs: I  Bathing/Dressing- I  Meal Prep- I  Eating- I  Maintaining continence- I  Transferring/Ambulation- I  Managing Meds- i  Follow up appointments reviewed:   PCP Hospital f/u appt confirmed? Yes  Scheduled to see Alma Friendly, NP on 06/06/2019 @ 11:40 am. Patient states he has an appointment with his oncologist on 06/04/2019 and he may not need to keep his follow up with Alma Friendly on 06/06/2019.  [facility] f/u appt confirmed? Yes  Scheduled to see oncology  Are transportation arrangements needed? No   If their condition worsens, is the pt aware to call PCP or go to the Emergency Dept.? Yes  Was the patient provided with contact information for the PCP's office or ED? Yes  Was to pt encouraged to call back with questions or concerns? Yes

## 2019-06-03 ENCOUNTER — Ambulatory Visit: Payer: Medicare PPO

## 2019-06-04 ENCOUNTER — Inpatient Hospital Stay: Payer: Medicare PPO

## 2019-06-04 ENCOUNTER — Inpatient Hospital Stay: Payer: Medicare PPO | Admitting: Oncology

## 2019-06-04 ENCOUNTER — Other Ambulatory Visit: Payer: Self-pay

## 2019-06-04 ENCOUNTER — Ambulatory Visit
Admission: RE | Admit: 2019-06-04 | Discharge: 2019-06-04 | Disposition: A | Discharge status: Home / Self Care | Payer: Medicare PPO | Source: Ambulatory Visit | Attending: Radiation Oncology | Admitting: Radiation Oncology

## 2019-06-04 ENCOUNTER — Encounter: Payer: Self-pay | Admitting: Oncology

## 2019-06-04 ENCOUNTER — Ambulatory Visit
Admission: RE | Admit: 2019-06-04 | Discharge: 2019-06-04 | Disposition: A | Discharge status: Home / Self Care | Payer: Medicare PPO | Source: Ambulatory Visit | Attending: Oncology | Admitting: Oncology

## 2019-06-04 VITALS — BP 117/76 | HR 96 | Temp 97.8°F | Resp 18 | Wt 172.7 lb

## 2019-06-04 DIAGNOSIS — D701 Agranulocytosis secondary to cancer chemotherapy: Secondary | ICD-10-CM | POA: Diagnosis not present

## 2019-06-04 DIAGNOSIS — D7589 Other specified diseases of blood and blood-forming organs: Secondary | ICD-10-CM | POA: Diagnosis not present

## 2019-06-04 DIAGNOSIS — R5383 Other fatigue: Secondary | ICD-10-CM | POA: Diagnosis not present

## 2019-06-04 DIAGNOSIS — C3490 Malignant neoplasm of unspecified part of unspecified bronchus or lung: Secondary | ICD-10-CM

## 2019-06-04 DIAGNOSIS — R0602 Shortness of breath: Secondary | ICD-10-CM | POA: Diagnosis not present

## 2019-06-04 DIAGNOSIS — K449 Diaphragmatic hernia without obstruction or gangrene: Secondary | ICD-10-CM | POA: Diagnosis not present

## 2019-06-04 DIAGNOSIS — R600 Localized edema: Secondary | ICD-10-CM | POA: Diagnosis not present

## 2019-06-04 DIAGNOSIS — T451X5A Adverse effect of antineoplastic and immunosuppressive drugs, initial encounter: Secondary | ICD-10-CM | POA: Diagnosis not present

## 2019-06-04 DIAGNOSIS — R918 Other nonspecific abnormal finding of lung field: Secondary | ICD-10-CM | POA: Diagnosis not present

## 2019-06-04 DIAGNOSIS — I480 Paroxysmal atrial fibrillation: Secondary | ICD-10-CM | POA: Diagnosis not present

## 2019-06-04 DIAGNOSIS — R590 Localized enlarged lymph nodes: Secondary | ICD-10-CM | POA: Diagnosis not present

## 2019-06-04 DIAGNOSIS — Z5111 Encounter for antineoplastic chemotherapy: Secondary | ICD-10-CM | POA: Diagnosis not present

## 2019-06-04 DIAGNOSIS — J90 Pleural effusion, not elsewhere classified: Secondary | ICD-10-CM | POA: Diagnosis not present

## 2019-06-04 DIAGNOSIS — R634 Abnormal weight loss: Secondary | ICD-10-CM | POA: Diagnosis not present

## 2019-06-04 DIAGNOSIS — N281 Cyst of kidney, acquired: Secondary | ICD-10-CM | POA: Diagnosis not present

## 2019-06-04 DIAGNOSIS — M1990 Unspecified osteoarthritis, unspecified site: Secondary | ICD-10-CM | POA: Diagnosis not present

## 2019-06-04 DIAGNOSIS — D6481 Anemia due to antineoplastic chemotherapy: Secondary | ICD-10-CM | POA: Diagnosis not present

## 2019-06-04 DIAGNOSIS — M069 Rheumatoid arthritis, unspecified: Secondary | ICD-10-CM | POA: Diagnosis not present

## 2019-06-04 DIAGNOSIS — R040 Epistaxis: Secondary | ICD-10-CM | POA: Diagnosis not present

## 2019-06-04 DIAGNOSIS — C3431 Malignant neoplasm of lower lobe, right bronchus or lung: Secondary | ICD-10-CM | POA: Diagnosis not present

## 2019-06-04 DIAGNOSIS — M8580 Other specified disorders of bone density and structure, unspecified site: Secondary | ICD-10-CM | POA: Diagnosis not present

## 2019-06-04 DIAGNOSIS — D696 Thrombocytopenia, unspecified: Secondary | ICD-10-CM | POA: Diagnosis not present

## 2019-06-04 DIAGNOSIS — B370 Candidal stomatitis: Secondary | ICD-10-CM

## 2019-06-04 DIAGNOSIS — Z95828 Presence of other vascular implants and grafts: Secondary | ICD-10-CM

## 2019-06-04 DIAGNOSIS — R5081 Fever presenting with conditions classified elsewhere: Secondary | ICD-10-CM | POA: Diagnosis not present

## 2019-06-04 DIAGNOSIS — E785 Hyperlipidemia, unspecified: Secondary | ICD-10-CM | POA: Diagnosis not present

## 2019-06-04 DIAGNOSIS — C3411 Malignant neoplasm of upper lobe, right bronchus or lung: Secondary | ICD-10-CM | POA: Diagnosis present

## 2019-06-04 DIAGNOSIS — E871 Hypo-osmolality and hyponatremia: Secondary | ICD-10-CM | POA: Diagnosis not present

## 2019-06-04 DIAGNOSIS — J449 Chronic obstructive pulmonary disease, unspecified: Secondary | ICD-10-CM | POA: Diagnosis not present

## 2019-06-04 LAB — CBC WITH DIFFERENTIAL/PLATELET
Abs Immature Granulocytes: 0.28 10*3/uL — ABNORMAL HIGH (ref 0.00–0.07)
Basophils Absolute: 0 10*3/uL (ref 0.0–0.1)
Basophils Relative: 1 %
Eosinophils Absolute: 0 10*3/uL (ref 0.0–0.5)
Eosinophils Relative: 0 %
HCT: 29.8 % — ABNORMAL LOW (ref 39.0–52.0)
Hemoglobin: 10.3 g/dL — ABNORMAL LOW (ref 13.0–17.0)
Immature Granulocytes: 5 %
Lymphocytes Relative: 11 %
Lymphs Abs: 0.6 10*3/uL — ABNORMAL LOW (ref 0.7–4.0)
MCH: 36 pg — ABNORMAL HIGH (ref 26.0–34.0)
MCHC: 34.6 g/dL (ref 30.0–36.0)
MCV: 104.2 fL — ABNORMAL HIGH (ref 80.0–100.0)
Monocytes Absolute: 0.9 10*3/uL (ref 0.1–1.0)
Monocytes Relative: 16 %
Neutro Abs: 3.8 10*3/uL (ref 1.7–7.7)
Neutrophils Relative %: 67 %
Platelets: 313 10*3/uL (ref 150–400)
RBC: 2.86 MIL/uL — ABNORMAL LOW (ref 4.22–5.81)
RDW: 17.2 % — ABNORMAL HIGH (ref 11.5–15.5)
WBC: 5.6 10*3/uL (ref 4.0–10.5)
nRBC: 0 % (ref 0.0–0.2)

## 2019-06-04 LAB — COMPREHENSIVE METABOLIC PANEL
ALT: 21 U/L (ref 0–44)
AST: 30 U/L (ref 15–41)
Albumin: 3 g/dL — ABNORMAL LOW (ref 3.5–5.0)
Alkaline Phosphatase: 107 U/L (ref 38–126)
Anion gap: 9 (ref 5–15)
BUN: 9 mg/dL (ref 8–23)
CO2: 23 mmol/L (ref 22–32)
Calcium: 8.4 mg/dL — ABNORMAL LOW (ref 8.9–10.3)
Chloride: 98 mmol/L (ref 98–111)
Creatinine, Ser: 1.01 mg/dL (ref 0.61–1.24)
GFR calc Af Amer: >60 mL/min (ref >60)
GFR calc non Af Amer: >60 mL/min (ref >60)
Glucose, Bld: 123 mg/dL — ABNORMAL HIGH (ref 70–99)
Potassium: 3.6 mmol/L (ref 3.5–5.1)
Sodium: 130 mmol/L — ABNORMAL LOW (ref 135–145)
Total Bilirubin: 0.6 mg/dL (ref 0.3–1.2)
Total Protein: 6 g/dL — ABNORMAL LOW (ref 6.5–8.1)

## 2019-06-04 MED ORDER — SODIUM CHLORIDE 0.9 % IV SOLN
368.0000 mg | Freq: Once | INTRAVENOUS | Status: AC
  Administered 2019-06-04: 370 mg via INTRAVENOUS
  Filled 2019-06-04: qty 37

## 2019-06-04 MED ORDER — SODIUM CHLORIDE 0.9 % IV SOLN
Freq: Once | INTRAVENOUS | Status: AC
  Filled 2019-06-04: qty 250

## 2019-06-04 MED ORDER — SODIUM CHLORIDE 0.9 % IV SOLN
10.0000 mg | Freq: Once | INTRAVENOUS | Status: AC
  Administered 2019-06-04: 10 mg via INTRAVENOUS
  Filled 2019-06-04: qty 10

## 2019-06-04 MED ORDER — PALONOSETRON HCL INJECTION 0.25 MG/5ML
0.2500 mg | Freq: Once | INTRAVENOUS | Status: AC
  Administered 2019-06-04: 0.25 mg via INTRAVENOUS
  Filled 2019-06-04: qty 5

## 2019-06-04 MED ORDER — SODIUM CHLORIDE 0.9 % IV SOLN
150.0000 mg | Freq: Once | INTRAVENOUS | Status: AC
  Administered 2019-06-04: 150 mg via INTRAVENOUS
  Filled 2019-06-04: qty 5

## 2019-06-04 MED ORDER — SODIUM CHLORIDE 0.9 % IV SOLN
100.0000 mg/m2 | Freq: Once | INTRAVENOUS | Status: AC
  Administered 2019-06-04: 190 mg via INTRAVENOUS
  Filled 2019-06-04: qty 9.5

## 2019-06-04 MED ORDER — SODIUM CHLORIDE 0.9% FLUSH
10.0000 mL | Freq: Once | INTRAVENOUS | Status: AC
  Administered 2019-06-04: 10 mL via INTRAVENOUS
  Filled 2019-06-04: qty 10

## 2019-06-04 MED ORDER — DRONABINOL 5 MG PO CAPS: 5.0000 mg | ORAL_CAPSULE | Freq: Two times a day (BID) | ORAL | 0 refills | Status: DC

## 2019-06-04 MED ORDER — NYSTATIN 100000 UNIT/ML MT SUSP: 5.0000 mL | Freq: Four times a day (QID) | OROMUCOSAL | 0 refills | Status: DC

## 2019-06-04 MED ORDER — HEPARIN SOD (PORK) LOCK FLUSH 100 UNIT/ML IV SOLN
500.0000 [IU] | Freq: Once | INTRAVENOUS | Status: AC | PRN
  Administered 2019-06-04: 500 [IU]
  Filled 2019-06-04: qty 5

## 2019-06-04 NOTE — Progress Notes (Addendum)
Hematology/Oncology follow up note [facility] [phone] Fax:([phone]   Patient Care Team: Pleas Koch, NP as PCP - General (Internal Medicine) Deboraha Sprang, MD as Consulting Physician (Cardiology) Marlowe Sax, MD as Referring Physician (Internal Medicine) Birder Robson, MD as Referring Physician (Ophthalmology) Oneta Rack, MD as Referring Physician (Dermatology) Salli Real, DDS as Referring Physician (Dentistry) Telford Nab, RN as Oncology Nurse Navigator Earlie Server, MD as Consulting Physician (Hematology and Oncology)  REFERRING PROVIDER: Pleas Koch, NP  CHIEF COMPLAINTS/REASON FOR VISIT:  Follow-up for management of lung cancer  HISTORY OF PRESENTING ILLNESS:   Darryl White is a  79 y.o.  male with PMH listed below was seen in consultation at the request of  Pleas Koch, NP  for evaluation of lung mass April 2020 patient was evaluated by radiation oncology Dr. Baruch Gouty for right lung nodules concerning for progressive malignancy.   Patient had progressively enlarging media right lower lobe which was previously treated in April 2020 empirically with SBRT.  Biopsy was not done due to poor lung function secondary to COPD/moderate fibrosis. Patient also had a right upper lobe nodule being closely watched, and was found to have progressively enlarged on 10/29/2018 CT chest. 02/27/2019 patient had another CT done which showed marked progression of precarinal lymph node and a continued progression of peripheral right upper lobe pulmonary nodule. 03/20/2019 PET scan showed hypermetabolic right lower paratracheal and subcarinal adenopathy with hypermetabolic right upper lobe subpleural nodule, or significantly worsened compared to prior CT scan. Patient also had 1.2 x 1 cm left upper lobe confluent groundglass densities with low metabolic activity. Patient was referred to establish care with oncology  for further evaluation and management.  Patient reports chronic shortness of breath with exertion.  He follows up with pulmonology and was recently seen by Dr. Patsey Berthold.  Denies any hemoptysis,.  He has a history of extensive smoking history, 67-pack-year, quit in 2019. He lives with wife at home. -CT head is negative. MRI is contraindicated due to pace marker.   # PET scan images were reviewed and discussed with patient. Also discussed with patient about pathology report Pre-carinal lymph node EBUS guided FNA showed malignancy, metastatic high-grade carcinoma. 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  There are patchy areas with focal weak staining for CD56-which is a neuroendocrine marker.  However many clusters of tumor cells are negative.  While the morphology alone would support a diagnosis of small cell carcinoma, there is insufficient immunohistochemistry evidence to support any specific tumor differentiation. I discussed the possibility of small cell lung cancer which is more aggressive in nature and is related with worse outcome. Regimen with concurrent radiation and chemotherapy with carboplatin and taxol would not be sufficient for small cell lung cancer treatment.We will treat the benefit of doubt using small cell lung cancer regimen, he agrees with the plan. I confirmed with Dr. 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  # Patient developed multiple epistaxis episodes during the interval.  Some episodes takes about 20 to 30 minutes to stop.  Patient called RN navigator and I advised patient to stop Eliquis.  Patient has been on Eliquis 5 mg twice daily due to cardiology diseases.  Patient has contacted cardiology and [name] recommend  patient to hold off  Eliquis for now  INTERVAL HISTORY Darryl White is a 79 y.o. male who has above history reviewed by me today presents for follow up acute visit for chemotherapy radiation for lung cancer-high-grade carcinoma,  Problems and complaints are listed below: Patient was admitted from 05/27/2019-05/30/2019 due to neutropenic fever. He has an [place] of 0.1. Urine and blood culture remained negative. CT abdomen concerning for colitis. Patient was treated with empiric broad-spectrum antibiotics and his symptoms improved. His ANC improved to 0.6 and patient was discharged on Keflex and Flagyl for 3 more days to complete treatment of colitis. He reports feeling well today. Radiation was held since his admission. No fever, chills, cough, chest pressure, shortness of breath, abdominal pain He had a few episodes of loose stools now symptom has resolved. Appetite is poor. He feels that he cannot taste his food. Denies any mouth sores. Patient noticed bilateral lower extremity edema since discharge. Left worse than right. No calf tenderness. Review of Systems  Constitutional: Positive for appetite change and fatigue. Negative for chills and fever.  HENT:   Negative for hearing loss, nosebleeds and voice change.   Eyes: Negative for eye problems and icterus.  Respiratory: Negative for chest tightness, cough and shortness of breath.   Cardiovascular: Negative for chest pain and leg swelling.  Gastrointestinal: Negative for abdominal distention and abdominal pain.  Endocrine: Negative for hot flashes.  Genitourinary: Negative for difficulty urinating, dysuria and frequency.   Musculoskeletal: Negative for arthralgias.  Skin: Negative for itching and rash.  Neurological: Negative for light-headedness and numbness.  Hematological: Negative for adenopathy. Does not bruise/bleed easily.  Psychiatric/Behavioral: Negative for confusion.    MEDICAL HISTORY:  Past Medical History:  Diagnosis Date  .  Arthritis 2019   dx of osteoarthritis and rhemautoid arthritis  . Cancer ([place])    lung  . Cataract 2019   left eye  . Chicken pox   . Collagen vascular disease ([place])   . 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 COPD (chronic obstructive pulmonary disease) ([place])   . Diverticulitis   . GERD (gastroesophageal reflux disease)   . 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 History of hiatal hernia   . History of stomach ulcers   . Hyperlipidemia   . Lymphocytic colitis   . Orthostatic hypotension   . Osteoarthritis   . Pancreatitis   . Paroxysmal atrial fibrillation (HCC)   . Presence of permanent cardiac pacemaker   . 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 APPENDECTOMY    . BACK SURGERY     bone chip lasered  . CARDIAC CATHETERIZATION    . CATARACT EXTRACTION W/ INTRAOCULAR LENS IMPLANT Right   . COLON SURGERY  1990's   12" removed of large intestine  . EYE SURGERY    . INSERT / REPLACE / [place]  . large intestine block    . PACEMAKER INSERTION     MDT dual chamber pacemaker  . PORTA CATH INSERTION N/A 04/17/2019   Procedure: PORTA CATH INSERTION;  Surgeon: Algernon Huxley, MD;  Location: [facility];  Service: Cardiovascular;  Laterality: N/A;  . PPM GENERATOR CHANGEOUT N/A 12/02/2018   Procedure: PPM GENERATOR CHANGEOUT;  Surgeon: Deboraha Sprang, MD;  Location: [facility];  Service: Cardiovascular;  Laterality: N/A;  . TONSILLECTOMY AND ADENOIDECTOMY    . VIDEO  BRONCHOSCOPY WITH ENDOBRONCHIAL ULTRASOUND N/A 04/07/2019   Procedure: VIDEO BRONCHOSCOPY WITH ENDOBRONCHIAL ULTRASOUND;  Surgeon: Tyler Pita, MD;  Location: [facility];  Service: Pulmonary;  Laterality: N/A;    SOCIAL HISTORY: Social History   Socioeconomic History  . Marital status: Married    Spouse name: Not on file  . Number of children: 2  . Years of education: Not on file  .  Highest education level: Not on file  Occupational History  . Occupation: Retired  Tobacco Use  . Smoking status: Former Smoker    Packs/day: 1.00    Years: 60.00    Pack years: 60.00    Types: Cigarettes    Quit date: 04/29/2017    Years since quitting: 2.0  . Smokeless tobacco: Never Used  Substance and Sexual Activity  . Alcohol use: Yes    Alcohol/week: 5.0 standard drinks    Types: 5 Glasses of wine per week  . Drug use: No  . Sexual activity: Never  Other Topics Concern  . Not on file  Social History Narrative   Married.   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      Does not have a living will.   Desires CPR, does not want prolonged life support if futile.   Social Determinants of Health   Financial Resource Strain: Low Risk   . Difficulty of Paying Living Expenses: Not hard at all  Food Insecurity: No Food Insecurity  . Worried About Charity fundraiser in the Last Year: Never true  . Ran Out of Food in the Last Year: Never true  Transportation Needs: No Transportation Needs  . Lack of Transportation (Medical): No  . Lack of Transportation (Non-Medical): No  Physical Activity: Sufficiently Active  . Days of Exercise per Week: 7 days  . Minutes of Exercise per Session: 60 min  Stress: No Stress Concern Present  . Feeling of Stress : Not at all  Social Connections:   . Frequency of Communication with Friends and Family:   . Frequency of Social Gatherings with Friends and Family:   . Attends Religious Services:   . Active Member of Clubs or Organizations:   . Attends Archivist Meetings:   Marland Kitchen Marital Status:   Intimate Partner Violence: Not At Risk  . Fear of Current or Ex-Partner: No  . Emotionally Abused: No  . Physically Abused: No  . Sexually Abused: No    FAMILY HISTORY: Family History  Problem Relation Age of Onset  . Lung cancer Mother   . Heart disease Father   . Alzheimer's disease Father     ALLERGIES:  is allergic to  sulfa antibiotics and penicillins.  MEDICATIONS:  Current Outpatient Medications  Medication Sig Dispense Refill  . albuterol (PROAIR HFA) 108 (90 Base) MCG/ACT inhaler Inhale 1-2 puffs into the lungs every 6 (six) hours as needed for wheezing or shortness of breath. 1 Inhaler 5  . antiseptic oral rinse (BIOTENE) LIQD 15 mLs by Mouth Rinse route as needed for dry mouth.    . brimonidine (ALPHAGAN) 0.15 % ophthalmic solution Place 1 drop into both eyes 2 (two) times daily.     . Budeson-Glycopyrrol-Formoterol (BREZTRI AEROSPHERE) 160-9-4.8 MCG/ACT AERO Inhale 2 puffs into the lungs 2 (two) times daily. 10.7 g 6  . chlorhexidine (PERIDEX) 0.12 % solution Use as directed 15 mLs in the mouth or throat in the morning and at bedtime. 473 mL 1  . Cholecalciferol (VITAMIN D3) 50 MCG (2000 UT) capsule Take  1 capsule (2,000 Units total) by mouth daily with lunch. 30 capsule 1  . famotidine (PEPCID) 20 MG tablet Take 20 mg by mouth 2 (two) times daily.    . fenofibrate (TRICOR) 145 MG tablet Take 1 tablet (145 mg total) by mouth daily. 90 tablet 1  . folic acid (FOLVITE) 1 MG tablet Take 1 mg by mouth daily.    Marland Kitchen gabapentin (NEURONTIN) 100 MG capsule Take 100 mg by mouth 3 (three) times daily.    Marland Kitchen lidocaine-prilocaine (EMLA) cream Apply to affected area once 30 g 3  . prochlorperazine (COMPAZINE) 10 MG tablet Take 1 tablet (10 mg total) by mouth every 6 (six) hours as needed (Nausea or vomiting). 30 tablet 1  . sodium chloride (OCEAN) 0.65 % SOLN nasal spray Place 1 spray into both nostrils at bedtime as needed for congestion.     . traMADol (ULTRAM) 50 MG tablet Take 25 mg by mouth at bedtime as needed for moderate pain.     . chlorproMAZINE (THORAZINE) 25 MG tablet Take 1 tablet (25 mg total) by mouth 3 (three) times daily. (Patient not taking: Reported on 06/04/2019) 30 tablet 0  . dronabinol (MARINOL) 5 MG capsule Take 1 capsule (5 mg total) by mouth 2 (two) times daily before a meal. 60 capsule 0  .  methotrexate 2.5 MG tablet Take 12.5 mg by mouth every Sunday.     . nystatin (MYCOSTATIN) 100000 UNIT/ML suspension Take 5 mLs (500,000 Units total) by mouth 4 (four) times daily. Swish and swallow 473 mL 0  . ondansetron (ZOFRAN) 8 MG tablet Take 1 tablet (8 mg total) by mouth 2 (two) times daily as needed for refractory nausea / vomiting. Start on day 3 after carboplatin chemo. (Patient not taking: Reported on 06/04/2019) 30 tablet 1   No current facility-administered medications for this visit.   Facility-Administered Medications Ordered in Other Visits  Medication Dose Route Frequency Provider Last Rate Last Admin  . etoposide (VEPESID) 190 mg in sodium chloride 0.9 % 500 mL chemo infusion  100 mg/m2 (Treatment Plan Recorded) Intravenous Once Earlie Server, MD 510 mL/hr at 06/04/19 1204 190 mg at 06/04/19 1204  . heparin lock flush 100 unit/mL  500 Units Intracatheter Once PRN Earlie Server, MD         PHYSICAL EXAMINATION: ECOG PERFORMANCE STATUS: 1 - Symptomatic but completely ambulatory Vitals:   06/04/19 0933  BP: 117/76  Pulse: 96  Resp: 18  Temp: 97.8 F (36.6 C)   Filed Weights   06/04/19 0933  Weight: 172 lb 11.2 oz (78.3 kg)    Physical Exam Vitals reviewed.  Constitutional:      General: He is not in acute distress.    Comments: Patient walks independently  HENT:     Head: Normocephalic and atraumatic.     Mouth/Throat:     Comments: Thrush Eyes:     General: No scleral icterus. Cardiovascular:     Rate and Rhythm: Normal rate and regular rhythm.     Heart sounds: Normal heart sounds.  Pulmonary:     Effort: Pulmonary effort is normal. No respiratory distress.     Breath sounds: No rhonchi.     Comments: Patient has bibasilar fine crackles chronically Abdominal:     General: Bowel sounds are normal. There is no distension.     Palpations: Abdomen is soft.  Musculoskeletal:        General: No deformity. Normal range of motion.     Cervical back: Normal  range of  motion and neck supple.  Skin:    General: Skin is warm and dry.     Findings: No erythema or rash.  Neurological:     Mental Status: He is alert and oriented to person, place, and time. Mental status is at baseline.     Cranial Nerves: No cranial nerve deficit.     Coordination: Coordination normal.  Psychiatric:        Mood and Affect: Mood normal.     LABORATORY DATA:  I have reviewed the data as listed Lab Results  Component Value Date   WBC 5.6 06/04/2019   HGB 10.3 (L) 06/04/2019   HCT 29.8 (L) 06/04/2019   MCV 104.2 (H) 06/04/2019   PLT 313 06/04/2019   Recent Labs    05/27/19 1052 05/27/19 1052 05/27/19 1152 05/28/19 0549 05/29/19 0405 05/30/19 0522 06/04/19 0826  NA 134*   < > 134*   < > 135 135 130*  K 4.1   < > 3.7   < > 3.7 3.8 3.6  CL 101   < > 100   < > 106 105 98  CO2 26   < > 25   < > 25 25 23   GLUCOSE 119*   < > 118*   < > 92 97 123*  BUN 20   < > 20   < > 13 17 9   CREATININE 1.18   < > 1.12   < > 0.91 1.03 1.01  CALCIUM 9.0   < > 9.1   < > 8.4* 8.3* 8.4*  GFRNONAA 59*   < > >60   < > >60 >60 >60  GFRAA >60   < > >60   < > >60 >60 >60  PROT 6.4*  --  6.8  --   --   --  6.0*  ALBUMIN 3.2*  --  3.4*  --   --   --  3.0*  AST 25  --  27  --   --   --  30  ALT 18  --  16  --   --   --  21  ALKPHOS 78  --  74  --   --   --  107  BILITOT 0.8  --  0.6  --   --   --  0.6   < > = values in this interval not displayed.   Iron/TIBC/Ferritin/ %Sat No results found for: IRON, TIBC, FERRITIN, IRONPCTSAT    RADIOGRAPHIC STUDIES: I have personally reviewed the radiological images as listed and agreed with the findings in the report.  DG Chest 2 View  Result Date: 05/27/2019 CLINICAL DATA:  Suspected sepsis. EXAM: CHEST - 2 VIEW COMPARISON:  PET-CT 03/20/2019. Chest CT 02/27/2019. Chest x-ray 04/11/2014. FINDINGS: PowerPort catheter noted with tip over the cavoatrial junction. Cardiac pacer stable position. Heart size normal. COPD. Chronic bilateral  interstitial prominence noted. Superimposed active pneumonitis cannot be excluded. No focal alveolar infiltrate. No pleural effusion or pneumothorax. IMPRESSION: 1.  PowerPort catheter noted with tip over SVC. 2.  Cardiac pacer stable position.  Heart size normal. 3. COPD. Chronic interstitial disease. Superimposed active pneumonitis cannot be excluded. No focal alveolar infiltrate noted. Electronically Signed   By: Marcello Moores  Register   On: 05/27/2019 12:21   CT ANGIO CHEST PE W OR WO CONTRAST  Result Date: 05/27/2019 CLINICAL DATA:  79 year old male with small cell lung cancer on radiation treatment and chemotherapy. Fever and shortness of breath. EXAM: CT  ANGIOGRAPHY CHEST WITH CONTRAST TECHNIQUE: Multidetector CT imaging of the chest was performed using the standard protocol during bolus administration of intravenous contrast. Multiplanar CT image reconstructions and MIPs were obtained to evaluate the vascular anatomy. CONTRAST:  47mL OMNIPAQUE IOHEXOL 350 MG/ML SOLN COMPARISON:  Chest radiograph dated 05/27/2019 and CT dated 02/27/2019 FINDINGS: Cardiovascular: There is no cardiomegaly or pericardial effusion. Coronary vascular calcification primarily involving the LAD. Left pectoral pacemaker device noted. There is advanced atherosclerotic calcification of the thoracic aorta. No aneurysmal dilatation or dissection. There is no CT evidence of pulmonary embolism. Mediastinum/Nodes: Significant interval decrease in the size of the enlarged lymph node seen on the prior CT anterior to the carina now measuring approximately 11 mm in short axis (previously 21 mm). Decrease in the size of subcarinal lymph node now measuring approximately 9 mm in short axis. No new adenopathy. There is a small hiatal hernia. There is mild circumferential thickening of the esophagus with infiltration of the surrounding fat which may represent esophagitis. Clinical correlation is recommended. No mediastinal fluid collection.  Lungs/Pleura: There is a background of emphysema. The previously seen right upper lobe subpleural lesion is not identified on today's exam. A 2 mm subpleural nodule (63/6) may correspond to the previously seen 6 mm nodule. A 7 mm nodule at the left lung base (71/6) has minimally decreased in size (previously measuring approximately 8 mm). There is trace right pleural effusion, new since the prior CT. No consolidative changes or pneumothorax. The central airways are patent. Upper Abdomen: There is scattered colonic diverticula. Apparent area of colonic thickening and mild pericolonic stranding in the right upper abdomen (series 4 image 100 and coronal series 7, image 22) is suboptimally evaluated due to partial visualization but is concerning for diverticulitis. CT of the abdomen pelvis may provide better evaluation. Musculoskeletal: Osteopenia with mild degenerative changes. No acute osseous pathology. Review of the MIP images confirms the above findings. IMPRESSION: 1. No acute intrathoracic pathology. No CT evidence of pulmonary embolism. 2. Significant interval decrease in the size of the previously seen mediastinal lymph nodes. No new adenopathy. 3. Near complete resolution of the previously seen right upper lobe hypermetabolic subpleural nodule. 4. Thickened appearance of the esophagus concerning for esophagitis. Clinical correlation is recommended. 5. Partially visualized area of colonic thickening and pericolonic stranding in the right upper abdomen, concerning for diverticulitis. CT of the abdomen pelvis may provide better evaluation. 6. Aortic Atherosclerosis (ICD10-I70.0) and Emphysema (ICD10-J43.9). Electronically Signed   By: Anner Crete M.D.   On: 05/27/2019 15:37   CT ABDOMEN PELVIS W CONTRAST  Result Date: 05/28/2019 CLINICAL DATA:  Fever of unknown origin.  Small cell lung carcinoma. EXAM: CT ABDOMEN AND PELVIS WITH CONTRAST TECHNIQUE: Multidetector CT imaging of the abdomen and pelvis was  performed using the standard protocol following bolus administration of intravenous contrast. CONTRAST:  100 mL OMNIPAQUE IOHEXOL 300 MG/ML  SOLN COMPARISON:  CT chest 05/27/2019.  PET CT scan 03/20/2019. FINDINGS: Lower chest: Lung bases again demonstrate severe emphysematous disease and basilar fibrosis. Small right pleural effusion noted. No left pleural effusion or pericardial effusion. Hepatobiliary: No focal liver abnormality is seen. No gallstones, gallbladder wall thickening, or biliary dilatation. Pancreas: Unremarkable. No pancreatic ductal dilatation or surrounding inflammatory changes. Spleen: Normal in size without focal abnormality. Adrenals/Urinary Tract: Adrenal glands are unremarkable. Small bilateral renal cysts are unchanged. The kidneys otherwise appear normal. Ureters and urinary bladder are unremarkable. Bladder is unremarkable. Stomach/Bowel: There are areas of marked wall thickening of the colon with  surrounding stranding, most notable in the proximal to mid transverse and mid to distal descending consistent with colitis. The appendix has been removed. The patient has a small hiatal hernia. The stomach is otherwise unremarkable. Small bowel appears normal. Vascular/Lymphatic: Atherosclerosis is noted. The descending abdominal aorta measures up to 2.7 cm in diameter, unchanged. Reproductive: Prostate is unremarkable. Other: None. Musculoskeletal: No acute or focal abnormality. IMPRESSION: The examination is positive for segmental infectious or inflammatory colitis most notable in the proximal to mid transverse and distal descending. No other acute abnormality. Severe emphysema. Small right pleural effusion. Atherosclerosis. Small hiatal hernia. Electronically Signed   By: Inge Rise M.D.   On: 05/28/2019 14:04      ASSESSMENT & PLAN:  1. Bilateral leg edema   2. Small cell lung cancer ([place])   3. Chemotherapy-induced neutropenia (HCC)   4. Rheumatoid arthritis, involving  unspecified site, unspecified whether rheumatoid factor present ([place])   5. Encounter for antineoplastic chemotherapy   6. Thrush   7. Weight loss    #Right lung high-grade poorly differentiated carcinoma-suspect small cell lung cancer.  Clinically T1c N2 M0.  Currently on concurrent chemotherapy and radiation. Labs reviewed and discussed with patient. Chemo Chemotherapy induced neutropenia has resolved. ANC is 3.8 today. Platelet count has also normalized to 313  Okay to proceed with cycle 3 carboplatin and etoposide, concurrent radiation. Given recent history of febrile neutropenia, I will lower carboplatin to AUC of 4.  Neutropenia precaution was discussed with patient. We will monitor his counts weekly. May need empiric antibiotics if ANC drops.  #Thrush, recommend patient to start nystatin swish and swallow. For mucositis prophylaxis, continue Peridex swish and spit twice daily. #Weight loss, likely chemotherapy-induced discussed with patient about starting appetite stimulant Marinol 5 mg twice daily.Marland Kitchen #Hyponatremia, likely secondary to poor oral intake. Patient will be given IV normal saline 500 cc x 1 today. Encourage oral hydration. #Bilateral lower extremity edema, he was recently hospitalized/immobilization. I will check ultrasound lower extremity to rule out DVT. If no DVT, advised patient to apply compression stocking. #Anemia, hemoglobin 10.3, stable. #Rheumatoid arthritis, currently off methotrexate. Continue folic acid supplementation. 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  #Continue Pepcid and sucralfate for radiation esophagitis prophylaxis.  All questions were answered. The patient knows to call the clinic with any problems questions or concerns. Follow up appointment: 1 week    Earlie Server, MD, PhD Hematology Oncology [facility] Pager- [phone] 06/04/2019

## 2019-06-04 NOTE — Progress Notes (Signed)
Patient is feeling much better today and no longer having a fever.

## 2019-06-05 ENCOUNTER — Other Ambulatory Visit: Payer: Medicare PPO

## 2019-06-05 ENCOUNTER — Ambulatory Visit
Admission: RE | Admit: 2019-06-05 | Discharge: 2019-06-05 | Disposition: A | Discharge status: Home / Self Care | Payer: Medicare PPO | Source: Ambulatory Visit | Attending: Radiation Oncology | Admitting: Radiation Oncology

## 2019-06-05 ENCOUNTER — Inpatient Hospital Stay: Payer: Medicare PPO

## 2019-06-05 VITALS — BP 120/78 | HR 90 | Temp 98.0°F | Resp 20

## 2019-06-05 DIAGNOSIS — C3431 Malignant neoplasm of lower lobe, right bronchus or lung: Secondary | ICD-10-CM | POA: Diagnosis not present

## 2019-06-05 DIAGNOSIS — Z5111 Encounter for antineoplastic chemotherapy: Secondary | ICD-10-CM | POA: Diagnosis not present

## 2019-06-05 DIAGNOSIS — C3490 Malignant neoplasm of unspecified part of unspecified bronchus or lung: Secondary | ICD-10-CM

## 2019-06-05 MED ORDER — HEPARIN SOD (PORK) LOCK FLUSH 100 UNIT/ML IV SOLN
500.0000 [IU] | Freq: Once | INTRAVENOUS | Status: AC | PRN
  Administered 2019-06-05: 15:00:00 500 [IU]
  Filled 2019-06-05: qty 5

## 2019-06-05 MED ORDER — SODIUM CHLORIDE 0.9 % IV SOLN
10.0000 mg | Freq: Once | INTRAVENOUS | Status: AC
  Administered 2019-06-05: 10 mg via INTRAVENOUS
  Filled 2019-06-05: qty 10

## 2019-06-05 MED ORDER — SODIUM CHLORIDE 0.9 % IV SOLN
100.0000 mg/m2 | Freq: Once | INTRAVENOUS | Status: AC
  Administered 2019-06-05: 190 mg via INTRAVENOUS
  Filled 2019-06-05: qty 9.5

## 2019-06-05 MED ORDER — SODIUM CHLORIDE 0.9 % IV SOLN
Freq: Once | INTRAVENOUS | Status: AC
  Filled 2019-06-05: qty 250

## 2019-06-06 ENCOUNTER — Inpatient Hospital Stay: Payer: Medicare PPO

## 2019-06-06 ENCOUNTER — Ambulatory Visit
Admission: RE | Admit: 2019-06-06 | Discharge: 2019-06-06 | Disposition: A | Discharge status: Home / Self Care | Payer: Medicare PPO | Source: Ambulatory Visit | Attending: Radiation Oncology | Admitting: Radiation Oncology

## 2019-06-06 ENCOUNTER — Other Ambulatory Visit: Payer: Self-pay

## 2019-06-06 ENCOUNTER — Ambulatory Visit: Payer: Medicare PPO | Admitting: Primary Care

## 2019-06-06 VITALS — BP 138/76 | HR 92 | Temp 96.0°F | Resp 20

## 2019-06-06 DIAGNOSIS — Z5111 Encounter for antineoplastic chemotherapy: Secondary | ICD-10-CM | POA: Diagnosis not present

## 2019-06-06 DIAGNOSIS — C3431 Malignant neoplasm of lower lobe, right bronchus or lung: Secondary | ICD-10-CM | POA: Diagnosis not present

## 2019-06-06 DIAGNOSIS — C3490 Malignant neoplasm of unspecified part of unspecified bronchus or lung: Secondary | ICD-10-CM

## 2019-06-06 MED ORDER — SODIUM CHLORIDE 0.9 % IV SOLN
10.0000 mg | Freq: Once | INTRAVENOUS | Status: AC
  Administered 2019-06-06: 10 mg via INTRAVENOUS
  Filled 2019-06-06: qty 10

## 2019-06-06 MED ORDER — SODIUM CHLORIDE 0.9 % IV SOLN
Freq: Once | INTRAVENOUS | Status: AC
  Filled 2019-06-06: qty 250

## 2019-06-06 MED ORDER — SODIUM CHLORIDE 0.9 % IV SOLN
100.0000 mg/m2 | Freq: Once | INTRAVENOUS | Status: AC
  Administered 2019-06-06: 190 mg via INTRAVENOUS
  Filled 2019-06-06: qty 9.5

## 2019-06-06 MED ORDER — HEPARIN SOD (PORK) LOCK FLUSH 100 UNIT/ML IV SOLN
500.0000 [IU] | Freq: Once | INTRAVENOUS | Status: AC | PRN
  Administered 2019-06-06: 500 [IU]
  Filled 2019-06-06: qty 5

## 2019-06-09 ENCOUNTER — Ambulatory Visit
Admission: RE | Admit: 2019-06-09 | Discharge: 2019-06-09 | Disposition: A | Discharge status: Home / Self Care | Payer: Medicare PPO | Source: Ambulatory Visit | Attending: Radiation Oncology | Admitting: Radiation Oncology

## 2019-06-09 ENCOUNTER — Other Ambulatory Visit: Payer: Self-pay | Admitting: *Deleted

## 2019-06-09 ENCOUNTER — Other Ambulatory Visit: Payer: Self-pay

## 2019-06-09 DIAGNOSIS — C3431 Malignant neoplasm of lower lobe, right bronchus or lung: Secondary | ICD-10-CM | POA: Diagnosis not present

## 2019-06-09 MED ORDER — FUROSEMIDE 20 MG PO TABS: 20.0000 mg | ORAL_TABLET | Freq: Every day | ORAL | 0 refills | Status: DC | PRN

## 2019-06-09 MED ORDER — ALBUTEROL SULFATE HFA 108 (90 BASE) MCG/ACT IN AERS: 1.0000 | INHALATION_SPRAY | Freq: Four times a day (QID) | RESPIRATORY_TRACT | 5 refills | Status: AC | PRN

## 2019-06-09 NOTE — Telephone Encounter (Signed)
Pt continues to have increased fluid in legs and is requesting refill of lasix. Per Dr. Tasia Catchings, okay to refill for pt to take if no improvement after trying compression stockings and leg elevation. Will notify pt with MD recommendations at radiation appt today. Nothing further needed at this time.

## 2019-06-10 ENCOUNTER — Ambulatory Visit
Admission: RE | Admit: 2019-06-10 | Discharge: 2019-06-10 | Disposition: A | Discharge status: Home / Self Care | Payer: Medicare PPO | Source: Ambulatory Visit | Attending: Radiation Oncology | Admitting: Radiation Oncology

## 2019-06-10 DIAGNOSIS — C3431 Malignant neoplasm of lower lobe, right bronchus or lung: Secondary | ICD-10-CM | POA: Diagnosis not present

## 2019-06-11 ENCOUNTER — Ambulatory Visit
Admission: RE | Admit: 2019-06-11 | Discharge: 2019-06-11 | Disposition: A | Discharge status: Home / Self Care | Payer: Medicare PPO | Source: Ambulatory Visit | Attending: Radiation Oncology | Admitting: Radiation Oncology

## 2019-06-11 ENCOUNTER — Inpatient Hospital Stay: Payer: Medicare PPO | Admitting: Oncology

## 2019-06-11 ENCOUNTER — Inpatient Hospital Stay: Payer: Medicare PPO

## 2019-06-11 ENCOUNTER — Ambulatory Visit: Payer: Medicare PPO

## 2019-06-11 ENCOUNTER — Other Ambulatory Visit: Payer: Self-pay

## 2019-06-11 ENCOUNTER — Encounter: Payer: Self-pay | Admitting: Oncology

## 2019-06-11 VITALS — BP 122/77 | HR 86 | Temp 96.2°F | Resp 20 | Wt 163.0 lb

## 2019-06-11 DIAGNOSIS — B370 Candidal stomatitis: Secondary | ICD-10-CM | POA: Diagnosis not present

## 2019-06-11 DIAGNOSIS — R600 Localized edema: Secondary | ICD-10-CM

## 2019-06-11 DIAGNOSIS — D701 Agranulocytosis secondary to cancer chemotherapy: Secondary | ICD-10-CM | POA: Diagnosis not present

## 2019-06-11 DIAGNOSIS — C3490 Malignant neoplasm of unspecified part of unspecified bronchus or lung: Secondary | ICD-10-CM

## 2019-06-11 DIAGNOSIS — C3431 Malignant neoplasm of lower lobe, right bronchus or lung: Secondary | ICD-10-CM | POA: Diagnosis not present

## 2019-06-11 DIAGNOSIS — M069 Rheumatoid arthritis, unspecified: Secondary | ICD-10-CM

## 2019-06-11 DIAGNOSIS — Z5111 Encounter for antineoplastic chemotherapy: Secondary | ICD-10-CM | POA: Diagnosis not present

## 2019-06-11 DIAGNOSIS — T451X5A Adverse effect of antineoplastic and immunosuppressive drugs, initial encounter: Secondary | ICD-10-CM

## 2019-06-11 LAB — CBC WITH DIFFERENTIAL/PLATELET
Abs Immature Granulocytes: 0.12 10*3/uL — ABNORMAL HIGH (ref 0.00–0.07)
Basophils Absolute: 0 10*3/uL (ref 0.0–0.1)
Basophils Relative: 1 %
Eosinophils Absolute: 0 10*3/uL (ref 0.0–0.5)
Eosinophils Relative: 0 %
HCT: 26.1 % — ABNORMAL LOW (ref 39.0–52.0)
Hemoglobin: 9 g/dL — ABNORMAL LOW (ref 13.0–17.0)
Immature Granulocytes: 5 %
Lymphocytes Relative: 15 %
Lymphs Abs: 0.4 10*3/uL — ABNORMAL LOW (ref 0.7–4.0)
MCH: 36.1 pg — ABNORMAL HIGH (ref 26.0–34.0)
MCHC: 34.5 g/dL (ref 30.0–36.0)
MCV: 104.8 fL — ABNORMAL HIGH (ref 80.0–100.0)
Monocytes Absolute: 0.1 10*3/uL (ref 0.1–1.0)
Monocytes Relative: 2 %
Neutro Abs: 1.9 10*3/uL (ref 1.7–7.7)
Neutrophils Relative %: 77 %
Platelets: 180 10*3/uL (ref 150–400)
RBC: 2.49 MIL/uL — ABNORMAL LOW (ref 4.22–5.81)
RDW: 16.5 % — ABNORMAL HIGH (ref 11.5–15.5)
Smear Review: ADEQUATE
WBC: 2.5 10*3/uL — ABNORMAL LOW (ref 4.0–10.5)
nRBC: 0 % (ref 0.0–0.2)

## 2019-06-11 LAB — COMPREHENSIVE METABOLIC PANEL
ALT: 20 U/L (ref 0–44)
AST: 29 U/L (ref 15–41)
Albumin: 3.1 g/dL — ABNORMAL LOW (ref 3.5–5.0)
Alkaline Phosphatase: 90 U/L (ref 38–126)
Anion gap: 7 (ref 5–15)
BUN: 24 mg/dL — ABNORMAL HIGH (ref 8–23)
CO2: 25 mmol/L (ref 22–32)
Calcium: 8.8 mg/dL — ABNORMAL LOW (ref 8.9–10.3)
Chloride: 101 mmol/L (ref 98–111)
Creatinine, Ser: 0.99 mg/dL (ref 0.61–1.24)
GFR calc Af Amer: >60 mL/min (ref >60)
GFR calc non Af Amer: >60 mL/min (ref >60)
Glucose, Bld: 119 mg/dL — ABNORMAL HIGH (ref 70–99)
Potassium: 4.3 mmol/L (ref 3.5–5.1)
Sodium: 133 mmol/L — ABNORMAL LOW (ref 135–145)
Total Bilirubin: 1.1 mg/dL (ref 0.3–1.2)
Total Protein: 6.1 g/dL — ABNORMAL LOW (ref 6.5–8.1)

## 2019-06-11 MED ORDER — SODIUM CHLORIDE 0.9% FLUSH
10.0000 mL | Freq: Once | INTRAVENOUS | Status: AC
  Administered 2019-06-11: 10 mL via INTRAVENOUS
  Filled 2019-06-11: qty 10

## 2019-06-11 MED ORDER — HEPARIN SOD (PORK) LOCK FLUSH 100 UNIT/ML IV SOLN
500.0000 [IU] | Freq: Once | INTRAVENOUS | Status: AC
  Administered 2019-06-11: 500 [IU] via INTRAVENOUS
  Filled 2019-06-11: qty 5

## 2019-06-11 NOTE — Progress Notes (Signed)
Patient does not offer any problems today.  

## 2019-06-12 ENCOUNTER — Ambulatory Visit
Admission: RE | Admit: 2019-06-12 | Discharge: 2019-06-12 | Disposition: A | Discharge status: Home / Self Care | Payer: Medicare PPO | Source: Ambulatory Visit | Attending: Radiation Oncology | Admitting: Radiation Oncology

## 2019-06-12 ENCOUNTER — Ambulatory Visit: Payer: Medicare PPO

## 2019-06-12 DIAGNOSIS — C3431 Malignant neoplasm of lower lobe, right bronchus or lung: Secondary | ICD-10-CM | POA: Diagnosis not present

## 2019-06-12 NOTE — Progress Notes (Signed)
Hematology/Oncology follow up note [facility] [phone] [phone]   Patient Care Team: Pleas Koch, NP as PCP - General (Internal Medicine) Deboraha Sprang, MD as Consulting Physician (Cardiology) Marlowe Sax, MD as Referring Physician (Internal Medicine) Birder Robson, MD as Referring Physician (Ophthalmology) Oneta Rack, MD as Referring Physician (Dermatology) Salli Real, DDS as Referring Physician (Dentistry) Telford Nab, RN as Oncology Nurse Navigator Earlie Server, MD as Consulting Physician (Hematology and Oncology)  REFERRING PROVIDER: Pleas Koch, NP  CHIEF COMPLAINTS/REASON FOR VISIT:  Follow-up for management of lung cancer  HISTORY OF PRESENTING ILLNESS:   Darryl White is a  79 y.o.  male with PMH listed below was seen in consultation at the request of  Pleas Koch, NP  for evaluation of lung mass April 2020 patient was evaluated by radiation oncology Dr. Baruch Gouty for right lung nodules concerning for progressive malignancy.   Patient had progressively enlarging media right lower lobe which was previously treated in April 2020 empirically with SBRT.  Biopsy was not done due to poor lung function secondary to COPD/moderate fibrosis. Patient also had a right upper lobe nodule being closely watched, and was found to have progressively enlarged on 10/29/2018 CT chest. 02/27/2019 patient had another CT done which showed marked progression of precarinal lymph node and a continued progression of peripheral right upper lobe pulmonary nodule. 03/20/2019 PET scan showed hypermetabolic right lower paratracheal and subcarinal adenopathy with hypermetabolic right upper lobe subpleural nodule, or significantly worsened compared to prior CT scan. Patient also had 1.2 x 1 cm left upper lobe confluent groundglass densities with low metabolic activity. Patient was referred to establish care with oncology  for further evaluation and management.  Patient reports chronic shortness of breath with exertion.  He follows up with pulmonology and was recently seen by Dr. Patsey Berthold.  Denies any hemoptysis,.  He has a history of extensive smoking history, 67-pack-year, quit in 2019. He lives with wife at home. -CT head is negative. MRI is contraindicated due to pace marker.   # PET scan images were reviewed and discussed with patient. Also discussed with patient about pathology report Pre-carinal lymph node EBUS guided FNA showed malignancy, metastatic high-grade carcinoma. 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  There are patchy areas with focal weak staining for CD56-which is a neuroendocrine marker.  However many clusters of tumor cells are negative.  While the morphology alone would support a diagnosis of small cell carcinoma, there is insufficient immunohistochemistry evidence to support any specific tumor differentiation. I discussed the possibility of small cell lung cancer which is more aggressive in nature and is related with worse outcome. Regimen with concurrent radiation and chemotherapy with carboplatin and taxol would not be sufficient for small cell lung cancer treatment.We will treat the benefit of doubt using small cell lung cancer regimen, he agrees with the plan. I confirmed with Dr. 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  # Patient developed multiple epistaxis episodes during the interval.  Some episodes takes about 20 to 30 minutes to stop.  Patient called RN navigator and I advised patient to stop Eliquis.  Patient has been on Eliquis 5 mg twice daily due to cardiology diseases.  Patient has contacted cardiology and [name] recommend  patient to hold off  Eliquis for now  # dmitted from 05/27/2019-05/30/2019 due to neutropenic fever. He has an [place] of 0.1. Urine and blood culture remained negative. CT abdomen concerning for colitis. Patient was treated with empiric broad-spectrum antibiotics and his symptoms improved. His ANC improved to 0.6 and patient was discharged on Keflex and Flagyl for 3 more days to complete treatment of colitis.  INTERVAL HISTORY Darryl White is a 79 y.o. male who has above history reviewed by me today presents for follow up acute visit for chemotherapy radiation for lung cancer-high-grade carcinoma,  Problems and complaints are listed below: Today he feels well. Taste and appetite have improved.  No new complaints. NO SOB,fever or chills.   Review of Systems  Constitutional: Positive for fatigue. Negative for appetite change, chills and fever.  HENT:   Negative for hearing loss, nosebleeds and voice change.   Eyes: Negative for eye problems and icterus.  Respiratory: Negative for chest tightness, cough and shortness of breath.   Cardiovascular: Negative for chest pain and leg swelling.  Gastrointestinal: Negative for abdominal distention and abdominal pain.  Endocrine: Negative for hot flashes.  Genitourinary: Negative for difficulty urinating, dysuria and frequency.   Musculoskeletal: Negative for arthralgias.  Skin: Negative for itching and rash.  Neurological: Negative for light-headedness and numbness.  Hematological: Negative for adenopathy. Does not bruise/bleed easily.  Psychiatric/Behavioral: Negative for confusion.    MEDICAL HISTORY:  Past Medical History:  Diagnosis Date  . Arthritis 2019   dx of osteoarthritis and rhemautoid arthritis  . Cancer ([place])    lung  . Cataract 2019   left eye  . Chicken pox   . Collagen vascular disease ([place])   . 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 COPD (chronic obstructive pulmonary disease) ([place])   . Diverticulitis   .  GERD (gastroesophageal reflux disease)   . 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 History of hiatal hernia   . History of stomach ulcers   . Hyperlipidemia   . Lymphocytic colitis   . Orthostatic hypotension   . Osteoarthritis   . Pancreatitis   . Paroxysmal atrial fibrillation (HCC)   . Presence of permanent cardiac pacemaker   . 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 APPENDECTOMY    . BACK SURGERY     bone chip lasered  . CARDIAC CATHETERIZATION    . CATARACT EXTRACTION W/ INTRAOCULAR LENS IMPLANT Right   . COLON SURGERY  1990's   12" removed of large intestine  . EYE SURGERY    . INSERT / REPLACE / [place]  . large intestine block    . PACEMAKER INSERTION     MDT dual chamber pacemaker  . PORTA CATH INSERTION N/A 04/17/2019   Procedure: PORTA CATH INSERTION;  Surgeon: Algernon Huxley, MD;  Location: [facility];  Service: Cardiovascular;  Laterality: N/A;  . PPM GENERATOR CHANGEOUT N/A 12/02/2018   Procedure: PPM GENERATOR CHANGEOUT;  Surgeon: Deboraha Sprang, MD;  Location: [facility];  Service: Cardiovascular;  Laterality: N/A;  . TONSILLECTOMY AND ADENOIDECTOMY    . VIDEO BRONCHOSCOPY WITH ENDOBRONCHIAL ULTRASOUND N/A 04/07/2019   Procedure: VIDEO BRONCHOSCOPY WITH ENDOBRONCHIAL ULTRASOUND;  Surgeon: Tyler Pita, MD;  Location: [facility];  Service: Pulmonary;  Laterality: N/A;    SOCIAL HISTORY: Social History   Socioeconomic History  . Marital status: Married  Spouse name: Not on file  . Number of children: 2  . Years of education: Not on file  . Highest education level: Not on file  Occupational History  . Occupation: Retired  Tobacco Use  . Smoking status: Former Smoker    Packs/day: 1.00    Years: 60.00    Pack years: 60.00    Types: Cigarettes    Quit date: 04/29/2017    Years since quitting: 2.1  . Smokeless tobacco: Never Used  Substance and  Sexual Activity  . Alcohol use: Yes    Alcohol/week: 5.0 standard drinks    Types: 5 Glasses of wine per week  . Drug use: No  . Sexual activity: Never  Other Topics Concern  . Not on file  Social History Narrative   Married.   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      Does not have a living will.   Desires CPR, does not want prolonged life support if futile.   Social Determinants of Health   Financial Resource Strain: Low Risk   . Difficulty of Paying Living Expenses: Not hard at all  Food Insecurity: No Food Insecurity  . Worried About Charity fundraiser in the Last Year: Never true  . Ran Out of Food in the Last Year: Never true  Transportation Needs: No Transportation Needs  . Lack of Transportation (Medical): No  . Lack of Transportation (Non-Medical): No  Physical Activity: Sufficiently Active  . Days of Exercise per Week: 7 days  . Minutes of Exercise per Session: 60 min  Stress: No Stress Concern Present  . Feeling of Stress : Not at all  Social Connections:   . Frequency of Communication with Friends and Family:   . Frequency of Social Gatherings with Friends and Family:   . Attends Religious Services:   . Active Member of Clubs or Organizations:   . Attends Archivist Meetings:   Marland Kitchen Marital Status:   Intimate Partner Violence: Not At Risk  . Fear of Current or Ex-Partner: No  . Emotionally Abused: No  . Physically Abused: No  . Sexually Abused: No    FAMILY HISTORY: Family History  Problem Relation Age of Onset  . Lung cancer Mother   . Heart disease Father   . Alzheimer's disease Father     ALLERGIES:  is allergic to sulfa antibiotics and penicillins.  MEDICATIONS:  Current Outpatient Medications  Medication Sig Dispense Refill  . albuterol (PROAIR HFA) 108 (90 Base) MCG/ACT inhaler Inhale 1-2 puffs into the lungs every 6 (six) hours as needed for wheezing or shortness of breath. 18 g 5  . antiseptic oral rinse (BIOTENE)  LIQD 15 mLs by Mouth Rinse route as needed for dry mouth.    . brimonidine (ALPHAGAN) 0.15 % ophthalmic solution Place 1 drop into both eyes 2 (two) times daily.     . Budeson-Glycopyrrol-Formoterol (BREZTRI AEROSPHERE) 160-9-4.8 MCG/ACT AERO Inhale 2 puffs into the lungs 2 (two) times daily. 10.7 g 6  . chlorhexidine (PERIDEX) 0.12 % solution Use as directed 15 mLs in the mouth or throat in the morning and at bedtime. 473 mL 1  . Cholecalciferol (VITAMIN D3) 50 MCG (2000 UT) capsule Take 1 capsule (2,000 Units total) by mouth daily with lunch. 30 capsule 1  . dronabinol (MARINOL) 5 MG capsule Take 1 capsule (5 mg total) by mouth 2 (two) times daily before a meal. 60 capsule 0  . famotidine (PEPCID) 20 MG tablet Take 20 mg by  mouth 2 (two) times daily.    . fenofibrate (TRICOR) 145 MG tablet Take 1 tablet (145 mg total) by mouth daily. 90 tablet 1  . folic acid (FOLVITE) 1 MG tablet Take 1 mg by mouth daily.    . furosemide (LASIX) 20 MG tablet Take 1 tablet (20 mg total) by mouth daily as needed for fluid. 30 tablet 0  . gabapentin (NEURONTIN) 100 MG capsule Take 100 mg by mouth 3 (three) times daily.    Marland Kitchen lidocaine-prilocaine (EMLA) cream Apply to affected area once 30 g 3  . nystatin (MYCOSTATIN) 100000 UNIT/ML suspension Take 5 mLs (500,000 Units total) by mouth 4 (four) times daily. Swish and swallow 473 mL 0  . prochlorperazine (COMPAZINE) 10 MG tablet Take 1 tablet (10 mg total) by mouth every 6 (six) hours as needed (Nausea or vomiting). 30 tablet 1  . sodium chloride (OCEAN) 0.65 % SOLN nasal spray Place 1 spray into both nostrils at bedtime as needed for congestion.     . traMADol (ULTRAM) 50 MG tablet Take 25 mg by mouth at bedtime as needed for moderate pain.     . chlorproMAZINE (THORAZINE) 25 MG tablet Take 1 tablet (25 mg total) by mouth 3 (three) times daily. (Patient not taking: Reported on 06/04/2019) 30 tablet 0  . methotrexate 2.5 MG tablet Take 12.5 mg by mouth every Sunday.       . ondansetron (ZOFRAN) 8 MG tablet Take 1 tablet (8 mg total) by mouth 2 (two) times daily as needed for refractory nausea / vomiting. Start on day 3 after carboplatin chemo. (Patient not taking: Reported on 06/04/2019) 30 tablet 1   No current facility-administered medications for this visit.     PHYSICAL EXAMINATION: ECOG PERFORMANCE STATUS: 1 - Symptomatic but completely ambulatory Vitals:   06/11/19 1044  BP: 122/77  Pulse: 86  Resp: 20  Temp: (!) 96.2 F (35.7 C)   Filed Weights   06/11/19 1044  Weight: 163 lb (73.9 kg)    Physical Exam Vitals reviewed.  Constitutional:      General: He is not in acute distress.    Comments: Patient walks independently  HENT:     Head: Normocephalic and atraumatic.     Mouth/Throat:     Comments: Ritta Slot has resolved.  Eyes:     General: No scleral icterus. Cardiovascular:     Rate and Rhythm: Normal rate and regular rhythm.     Heart sounds: Normal heart sounds.  Pulmonary:     Effort: Pulmonary effort is normal. No respiratory distress.     Breath sounds: No rhonchi.     Comments: Patient has bibasilar fine crackles chronically Abdominal:     General: Bowel sounds are normal. There is no distension.     Palpations: Abdomen is soft.  Musculoskeletal:        General: No deformity. Normal range of motion.     Cervical back: Normal range of motion and neck supple.  Skin:    General: Skin is warm and dry.     Findings: No erythema or rash.  Neurological:     Mental Status: He is alert and oriented to person, place, and time. Mental status is at baseline.     Cranial Nerves: No cranial nerve deficit.     Coordination: Coordination normal.  Psychiatric:        Mood and Affect: Mood normal.     LABORATORY DATA:  I have reviewed the data as listed Lab Results  Component Value Date   WBC 2.5 (L) 06/11/2019   HGB 9.0 (L) 06/11/2019   HCT 26.1 (L) 06/11/2019   MCV 104.8 (H) 06/11/2019   PLT 180 06/11/2019   Recent Labs     05/27/19 1152 05/28/19 0549 05/30/19 0522 06/04/19 0826 06/11/19 1002  NA 134*   < > 135 130* 133*  K 3.7   < > 3.8 3.6 4.3  CL 100   < > 105 98 101  CO2 25   < > 25 23 25   GLUCOSE 118*   < > 97 123* 119*  BUN 20   < > 17 9 24*  CREATININE 1.12   < > 1.03 1.01 0.99  CALCIUM 9.1   < > 8.3* 8.4* 8.8*  GFRNONAA >60   < > >60 >60 >60  GFRAA >60   < > >60 >60 >60  PROT 6.8  --   --  6.0* 6.1*  ALBUMIN 3.4*  --   --  3.0* 3.1*  AST 27  --   --  30 29  ALT 16  --   --  21 20  ALKPHOS 74  --   --  107 90  BILITOT 0.6  --   --  0.6 1.1   < > = values in this interval not displayed.   Iron/TIBC/Ferritin/ %Sat No results found for: IRON, TIBC, FERRITIN, IRONPCTSAT    RADIOGRAPHIC STUDIES: I have personally reviewed the radiological images as listed and agreed with the findings in the report.  DG Chest 2 View  Result Date: 05/27/2019 CLINICAL DATA:  Suspected sepsis. EXAM: CHEST - 2 VIEW COMPARISON:  PET-CT 03/20/2019. Chest CT 02/27/2019. Chest x-ray 04/11/2014. FINDINGS: PowerPort catheter noted with tip over the cavoatrial junction. Cardiac pacer stable position. Heart size normal. COPD. Chronic bilateral interstitial prominence noted. Superimposed active pneumonitis cannot be excluded. No focal alveolar infiltrate. No pleural effusion or pneumothorax. IMPRESSION: 1.  PowerPort catheter noted with tip over SVC. 2.  Cardiac pacer stable position.  Heart size normal. 3. COPD. Chronic interstitial disease. Superimposed active pneumonitis cannot be excluded. No focal alveolar infiltrate noted. Electronically Signed   By: Marcello Moores  Register   On: 05/27/2019 12:21   CT ANGIO CHEST PE W OR WO CONTRAST  Result Date: 05/27/2019 CLINICAL DATA:  79 year old male with small cell lung cancer on radiation treatment and chemotherapy. Fever and shortness of breath. EXAM: CT ANGIOGRAPHY CHEST WITH CONTRAST TECHNIQUE: Multidetector CT imaging of the chest was performed using the standard protocol during bolus  administration of intravenous contrast. Multiplanar CT image reconstructions and MIPs were obtained to evaluate the vascular anatomy. CONTRAST:  56mL OMNIPAQUE IOHEXOL 350 MG/ML SOLN COMPARISON:  Chest radiograph dated 05/27/2019 and CT dated 02/27/2019 FINDINGS: Cardiovascular: There is no cardiomegaly or pericardial effusion. Coronary vascular calcification primarily involving the LAD. Left pectoral pacemaker device noted. There is advanced atherosclerotic calcification of the thoracic aorta. No aneurysmal dilatation or dissection. There is no CT evidence of pulmonary embolism. Mediastinum/Nodes: Significant interval decrease in the size of the enlarged lymph node seen on the prior CT anterior to the carina now measuring approximately 11 mm in short axis (previously 21 mm). Decrease in the size of subcarinal lymph node now measuring approximately 9 mm in short axis. No new adenopathy. There is a small hiatal hernia. There is mild circumferential thickening of the esophagus with infiltration of the surrounding fat which may represent esophagitis. Clinical correlation is recommended. No mediastinal fluid collection. Lungs/Pleura: There is a background of  emphysema. The previously seen right upper lobe subpleural lesion is not identified on today's exam. A 2 mm subpleural nodule (63/6) may correspond to the previously seen 6 mm nodule. A 7 mm nodule at the left lung base (71/6) has minimally decreased in size (previously measuring approximately 8 mm). There is trace right pleural effusion, new since the prior CT. No consolidative changes or pneumothorax. The central airways are patent. Upper Abdomen: There is scattered colonic diverticula. Apparent area of colonic thickening and mild pericolonic stranding in the right upper abdomen (series 4 image 100 and coronal series 7, image 22) is suboptimally evaluated due to partial visualization but is concerning for diverticulitis. CT of the abdomen pelvis may provide better  evaluation. Musculoskeletal: Osteopenia with mild degenerative changes. No acute osseous pathology. Review of the MIP images confirms the above findings. IMPRESSION: 1. No acute intrathoracic pathology. No CT evidence of pulmonary embolism. 2. Significant interval decrease in the size of the previously seen mediastinal lymph nodes. No new adenopathy. 3. Near complete resolution of the previously seen right upper lobe hypermetabolic subpleural nodule. 4. Thickened appearance of the esophagus concerning for esophagitis. Clinical correlation is recommended. 5. Partially visualized area of colonic thickening and pericolonic stranding in the right upper abdomen, concerning for diverticulitis. CT of the abdomen pelvis may provide better evaluation. 6. Aortic Atherosclerosis (ICD10-I70.0) and Emphysema (ICD10-J43.9). Electronically Signed   By: Anner Crete M.D.   On: 05/27/2019 15:37   CT ABDOMEN PELVIS W CONTRAST  Result Date: 05/28/2019 CLINICAL DATA:  Fever of unknown origin.  Small cell lung carcinoma. EXAM: CT ABDOMEN AND PELVIS WITH CONTRAST TECHNIQUE: Multidetector CT imaging of the abdomen and pelvis was performed using the standard protocol following bolus administration of intravenous contrast. CONTRAST:  100 mL OMNIPAQUE IOHEXOL 300 MG/ML  SOLN COMPARISON:  CT chest 05/27/2019.  PET CT scan 03/20/2019. FINDINGS: Lower chest: Lung bases again demonstrate severe emphysematous disease and basilar fibrosis. Small right pleural effusion noted. No left pleural effusion or pericardial effusion. Hepatobiliary: No focal liver abnormality is seen. No gallstones, gallbladder wall thickening, or biliary dilatation. Pancreas: Unremarkable. No pancreatic ductal dilatation or surrounding inflammatory changes. Spleen: Normal in size without focal abnormality. Adrenals/Urinary Tract: Adrenal glands are unremarkable. Small bilateral renal cysts are unchanged. The kidneys otherwise appear normal. Ureters and urinary  bladder are unremarkable. Bladder is unremarkable. Stomach/Bowel: There are areas of marked wall thickening of the colon with surrounding stranding, most notable in the proximal to mid transverse and mid to distal descending consistent with colitis. The appendix has been removed. The patient has a small hiatal hernia. The stomach is otherwise unremarkable. Small bowel appears normal. Vascular/Lymphatic: Atherosclerosis is noted. The descending abdominal aorta measures up to 2.7 cm in diameter, unchanged. Reproductive: Prostate is unremarkable. Other: None. Musculoskeletal: No acute or focal abnormality. IMPRESSION: The examination is positive for segmental infectious or inflammatory colitis most notable in the proximal to mid transverse and distal descending. No other acute abnormality. Severe emphysema. Small right pleural effusion. Atherosclerosis. Small hiatal hernia. Electronically Signed   By: Inge Rise M.D.   On: 05/28/2019 14:04   US Venous Img Lower Bilateral  Result Date: 06/04/2019 CLINICAL DATA:  Bilateral leg edema. EXAM: BILATERAL LOWER EXTREMITY VENOUS DOPPLER ULTRASOUND TECHNIQUE: Gray-scale sonography with graded compression, as well as color Doppler and duplex ultrasound were performed to evaluate the lower extremity deep venous systems from the level of the common femoral vein and including the common femoral, femoral, profunda femoral, popliteal and calf veins including  the posterior tibial, peroneal and gastrocnemius veins when visible. The superficial great saphenous vein was also interrogated. Spectral Doppler was utilized to evaluate flow at rest and with distal augmentation maneuvers in the common femoral, femoral and popliteal veins. COMPARISON:  None. FINDINGS: RIGHT LOWER EXTREMITY Common Femoral Vein: No evidence of thrombus. Normal compressibility, respiratory phasicity and response to augmentation. Saphenofemoral Junction: No evidence of thrombus. Normal compressibility and  flow on color Doppler imaging. Profunda Femoral Vein: No evidence of thrombus. Normal compressibility and flow on color Doppler imaging. Femoral Vein: No evidence of thrombus. Normal compressibility, respiratory phasicity and response to augmentation. Popliteal Vein: No evidence of thrombus. Normal compressibility, respiratory phasicity and response to augmentation. Calf Veins: No evidence of thrombus. Normal compressibility and flow on color Doppler imaging. LEFT LOWER EXTREMITY Common Femoral Vein: No evidence of thrombus. Normal compressibility, respiratory phasicity and response to augmentation. Saphenofemoral Junction: No evidence of thrombus. Normal compressibility and flow on color Doppler imaging. Profunda Femoral Vein: No evidence of thrombus. Normal compressibility and flow on color Doppler imaging. Femoral Vein: No evidence of thrombus. Normal compressibility, respiratory phasicity and response to augmentation. Popliteal Vein: No evidence of thrombus. Normal compressibility, respiratory phasicity and response to augmentation. Calf Veins: No evidence of thrombus. Normal compressibility and flow on color Doppler imaging. Other Findings:  None. IMPRESSION: No evidence of deep venous thrombosis in either lower extremity. Electronically Signed   By: Markus Daft M.D.   On: 06/04/2019 15:38      ASSESSMENT & PLAN:  1. 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 Chemotherapy-induced neutropenia (HCC)   3. Thrush   4. Bilateral leg edema   5. Rheumatoid arthritis, involving unspecified site, unspecified whether rheumatoid factor present ([place])    #Right lung high-grade poorly differentiated carcinoma-suspect small cell lung cancer.  Clinically T1c N2 M0.  Currently on concurrent chemotherapy and radiation. Labs are reviewed and discussed with patient. Leukopenia, with ANC 1.9. no growth factor support due to being on Radiation.  Continue Peridex oral rinse BID and nystatin swish and swallow for  prophylaxis. Ritta Slot has resolved.  No need for prophylactic antibiotics given that his neutropenia has improved.   #Hyponatremia, likely secondary to poor oral intake. Sodium has improved.  #Bilateral lower extremity edema, he was recently hospitalized/immobilization.[place] lowe extremity showed no DVT.  Recommend compression stocking.   #Anemia, hemoglobin 0, due to chemo and RT. Monitor.  #Rheumatoid arthritis, currently off methotrexate. Continue folic acid supplementation. 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 # currently off eliquis, plan to resume once he finished treatments.  #Continue Pepcid and sucralfate for radiation esophagitis prophylaxis.  All questions were answered. The patient knows to call the clinic with any problems questions or concerns. Follow up appointment: 1 week    Earlie Server, MD, PhD Hematology Oncology [facility] at [facility] Pager- [phone] 06/12/2019

## 2019-06-13 ENCOUNTER — Ambulatory Visit: Payer: Medicare PPO

## 2019-06-13 ENCOUNTER — Ambulatory Visit
Admission: RE | Admit: 2019-06-13 | Discharge: 2019-06-13 | Disposition: A | Discharge status: Home / Self Care | Payer: Medicare PPO | Source: Ambulatory Visit | Attending: Radiation Oncology | Admitting: Radiation Oncology

## 2019-06-13 DIAGNOSIS — C3431 Malignant neoplasm of lower lobe, right bronchus or lung: Secondary | ICD-10-CM | POA: Diagnosis not present

## 2019-06-16 ENCOUNTER — Ambulatory Visit: Payer: Medicare PPO

## 2019-06-16 ENCOUNTER — Ambulatory Visit
Admission: RE | Admit: 2019-06-16 | Discharge: 2019-06-16 | Disposition: A | Discharge status: Home / Self Care | Payer: Medicare PPO | Source: Ambulatory Visit | Attending: Radiation Oncology | Admitting: Radiation Oncology

## 2019-06-16 DIAGNOSIS — C3431 Malignant neoplasm of lower lobe, right bronchus or lung: Secondary | ICD-10-CM | POA: Diagnosis not present

## 2019-06-17 ENCOUNTER — Ambulatory Visit
Admission: RE | Admit: 2019-06-17 | Discharge: 2019-06-17 | Disposition: A | Discharge status: Home / Self Care | Payer: Medicare PPO | Source: Ambulatory Visit | Attending: Radiation Oncology | Admitting: Radiation Oncology

## 2019-06-17 ENCOUNTER — Ambulatory Visit: Payer: Medicare PPO

## 2019-06-17 DIAGNOSIS — C3431 Malignant neoplasm of lower lobe, right bronchus or lung: Secondary | ICD-10-CM | POA: Diagnosis not present

## 2019-06-18 ENCOUNTER — Inpatient Hospital Stay: Payer: Medicare PPO

## 2019-06-18 ENCOUNTER — Ambulatory Visit
Admission: RE | Admit: 2019-06-18 | Discharge: 2019-06-18 | Disposition: A | Discharge status: Home / Self Care | Payer: Medicare PPO | Source: Ambulatory Visit | Attending: Radiation Oncology | Admitting: Radiation Oncology

## 2019-06-18 ENCOUNTER — Encounter: Payer: Self-pay | Admitting: Oncology

## 2019-06-18 ENCOUNTER — Other Ambulatory Visit: Payer: Self-pay

## 2019-06-18 ENCOUNTER — Inpatient Hospital Stay: Payer: Medicare PPO | Admitting: Oncology

## 2019-06-18 VITALS — BP 122/82 | HR 88 | Temp 96.0°F | Resp 18 | Wt 163.5 lb

## 2019-06-18 DIAGNOSIS — Z5111 Encounter for antineoplastic chemotherapy: Secondary | ICD-10-CM | POA: Diagnosis not present

## 2019-06-18 DIAGNOSIS — D701 Agranulocytosis secondary to cancer chemotherapy: Secondary | ICD-10-CM | POA: Diagnosis not present

## 2019-06-18 DIAGNOSIS — C3490 Malignant neoplasm of unspecified part of unspecified bronchus or lung: Secondary | ICD-10-CM

## 2019-06-18 DIAGNOSIS — T451X5A Adverse effect of antineoplastic and immunosuppressive drugs, initial encounter: Secondary | ICD-10-CM

## 2019-06-18 DIAGNOSIS — M069 Rheumatoid arthritis, unspecified: Secondary | ICD-10-CM | POA: Diagnosis not present

## 2019-06-18 DIAGNOSIS — C3431 Malignant neoplasm of lower lobe, right bronchus or lung: Secondary | ICD-10-CM | POA: Diagnosis not present

## 2019-06-18 DIAGNOSIS — D696 Thrombocytopenia, unspecified: Secondary | ICD-10-CM

## 2019-06-18 LAB — CBC WITH DIFFERENTIAL/PLATELET
Abs Immature Granulocytes: 0 10*3/uL (ref 0.00–0.07)
Basophils Absolute: 0 10*3/uL (ref 0.0–0.1)
Basophils Relative: 2 %
Eosinophils Absolute: 0 10*3/uL (ref 0.0–0.5)
Eosinophils Relative: 1 %
HCT: 27.7 % — ABNORMAL LOW (ref 39.0–52.0)
Hemoglobin: 9.2 g/dL — ABNORMAL LOW (ref 13.0–17.0)
Immature Granulocytes: 0 %
Lymphocytes Relative: 38 %
Lymphs Abs: 0.4 10*3/uL — ABNORMAL LOW (ref 0.7–4.0)
MCH: 36.4 pg — ABNORMAL HIGH (ref 26.0–34.0)
MCHC: 33.2 g/dL (ref 30.0–36.0)
MCV: 109.5 fL — ABNORMAL HIGH (ref 80.0–100.0)
Monocytes Absolute: 0.2 10*3/uL (ref 0.1–1.0)
Monocytes Relative: 22 %
Neutro Abs: 0.4 10*3/uL — ABNORMAL LOW (ref 1.7–7.7)
Neutrophils Relative %: 37 %
Platelets: 52 10*3/uL — ABNORMAL LOW (ref 150–400)
RBC: 2.53 MIL/uL — ABNORMAL LOW (ref 4.22–5.81)
RDW: 17.9 % — ABNORMAL HIGH (ref 11.5–15.5)
WBC: 1 10*3/uL — CL (ref 4.0–10.5)
nRBC: 0 % (ref 0.0–0.2)

## 2019-06-18 LAB — COMPREHENSIVE METABOLIC PANEL
ALT: 19 U/L (ref 0–44)
AST: 31 U/L (ref 15–41)
Albumin: 3.5 g/dL (ref 3.5–5.0)
Alkaline Phosphatase: 104 U/L (ref 38–126)
Anion gap: 9 (ref 5–15)
BUN: 16 mg/dL (ref 8–23)
CO2: 25 mmol/L (ref 22–32)
Calcium: 8.9 mg/dL (ref 8.9–10.3)
Chloride: 100 mmol/L (ref 98–111)
Creatinine, Ser: 1.12 mg/dL (ref 0.61–1.24)
GFR calc Af Amer: >60 mL/min (ref >60)
GFR calc non Af Amer: >60 mL/min (ref >60)
Glucose, Bld: 104 mg/dL — ABNORMAL HIGH (ref 70–99)
Potassium: 4.8 mmol/L (ref 3.5–5.1)
Sodium: 134 mmol/L — ABNORMAL LOW (ref 135–145)
Total Bilirubin: 0.8 mg/dL (ref 0.3–1.2)
Total Protein: 6.6 g/dL (ref 6.5–8.1)

## 2019-06-18 MED ORDER — NYSTATIN 100000 UNIT/ML MT SUSP: 5.0000 mL | Freq: Four times a day (QID) | OROMUCOSAL | 0 refills | Status: DC

## 2019-06-18 MED ORDER — CHLORHEXIDINE GLUCONATE 0.12 % MT SOLN: 15.0000 mL | Freq: Two times a day (BID) | OROMUCOSAL | 0 refills | Status: DC

## 2019-06-18 MED ORDER — CIPROFLOXACIN HCL 500 MG PO TABS: 500.0000 mg | ORAL_TABLET | Freq: Two times a day (BID) | ORAL | 0 refills | Status: DC

## 2019-06-18 NOTE — Progress Notes (Signed)
Hematology/Oncology follow up note [facility] Telephone:([phone]) [phone] Fax:(336) [phone]   Patient Care Team: Pleas Koch, NP as PCP - General (Internal Medicine) Deboraha Sprang, MD as Consulting Physician (Cardiology) Marlowe Sax, MD as Referring Physician (Internal Medicine) Birder Robson, MD as Referring Physician (Ophthalmology) Oneta Rack, MD as Referring Physician (Dermatology) Salli Real, DDS as Referring Physician (Dentistry) Telford Nab, RN as Oncology Nurse Navigator Earlie Server, MD as Consulting Physician (Hematology and Oncology)  REFERRING PROVIDER: Pleas Koch, NP  CHIEF COMPLAINTS/REASON FOR VISIT:  Follow-up for management of lung cancer  HISTORY OF PRESENTING ILLNESS:   EOGHAN White is a  79 y.o.  male with PMH listed below was seen in consultation at the request of  Pleas Koch, NP  for evaluation of lung mass April 2020 patient was evaluated by radiation oncology Dr. Baruch Gouty for right lung nodules concerning for progressive malignancy.   Patient had progressively enlarging media right lower lobe which was previously treated in April 2020 empirically with SBRT.  Biopsy was not done due to poor lung function secondary to COPD/moderate fibrosis. Patient also had a right upper lobe nodule being closely watched, and was found to have progressively enlarged on 10/29/2018 CT chest. 02/27/2019 patient had another CT done which showed marked progression of precarinal lymph node and a continued progression of peripheral right upper lobe pulmonary nodule. 03/20/2019 PET scan showed hypermetabolic right lower paratracheal and subcarinal adenopathy with hypermetabolic right upper lobe subpleural nodule, or significantly worsened compared to prior CT scan. Patient also had 1.2 x 1 cm left upper lobe confluent groundglass densities with low metabolic activity. Patient was referred to establish care with oncology  for further evaluation and management.  Patient reports chronic shortness of breath with exertion.  He follows up with pulmonology and was recently seen by Dr. Patsey Berthold.  Denies any hemoptysis,.  He has a history of extensive smoking history, 67-pack-year, quit in 2019. He lives with wife at home. -CT head is negative. MRI is contraindicated due to pace marker.   # PET scan images were reviewed and discussed with patient. Also discussed with patient about pathology report Pre-carinal lymph node EBUS guided FNA showed malignancy, metastatic high-grade carcinoma. 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  There are patchy areas with focal weak staining for CD56-which is a neuroendocrine marker.  However many clusters of tumor cells are negative.  While the morphology alone would support a diagnosis of small cell carcinoma, there is insufficient immunohistochemistry evidence to support any specific tumor differentiation. I discussed the possibility of small cell lung cancer which is more aggressive in nature and is related with worse outcome. Regimen with concurrent radiation and chemotherapy with carboplatin and taxol would not be sufficient for small cell lung cancer treatment.We will treat the benefit of doubt using small cell lung cancer regimen, he agrees with the plan. I confirmed with Dr. 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  # Patient developed multiple epistaxis episodes during the interval.  Some episodes takes about 20 to 30 minutes to stop.  Patient called RN navigator and I advised patient to stop Eliquis.  Patient has been on Eliquis 5 mg twice daily due to cardiology diseases.  Patient has contacted cardiology and [name] recommend  patient to hold off  Eliquis for now  # dmitted from 05/27/2019-05/30/2019 due to neutropenic fever. He has an [place] of 0.1. Urine and blood culture remained negative. CT abdomen concerning for colitis. Patient was treated with empiric broad-spectrum antibiotics and his symptoms improved. His ANC improved to 0.6 and patient was discharged on Keflex and Flagyl for 3 more days to complete treatment of colitis.  INTERVAL HISTORY Darryl White is a 79 y.o. male who has above history reviewed by me today presents for follow up acute visit for chemotherapy radiation for lung cancer-high-grade carcinoma,  Problems and complaints are listed below: Patient has no new complaints. Denies any fever, chills, nausea vomiting. No cough or chest pain .  Appetite is fair.    Review of Systems  Constitutional: Positive for fatigue. Negative for appetite change, chills, fever and unexpected weight change.  HENT:   Negative for hearing loss, nosebleeds and voice change.   Eyes: Negative for eye problems and icterus.  Respiratory: Negative for chest tightness, cough and shortness of breath.   Cardiovascular: Negative for chest pain and leg swelling.  Gastrointestinal: Negative for abdominal distention and abdominal pain.  Endocrine: Negative for hot flashes.  Genitourinary: Negative for difficulty urinating, dysuria and frequency.   Musculoskeletal: Negative for arthralgias.  Skin: Negative for itching and rash.  Neurological: Negative for light-headedness and numbness.  Hematological: Negative for adenopathy. Does not bruise/bleed easily.  Psychiatric/Behavioral: Negative for confusion.    MEDICAL HISTORY:  Past Medical History:  Diagnosis Date  . Arthritis 2019   dx of osteoarthritis and rhemautoid arthritis  . Cancer ([place])    lung  . Cataract 2019   left eye  . Chicken pox   . Collagen vascular disease ([place])   . 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 COPD (chronic obstructive  pulmonary disease) ([place])   . Diverticulitis   . GERD (gastroesophageal reflux disease)   . 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 History of hiatal hernia   . History of stomach ulcers   . Hyperlipidemia   . Lymphocytic colitis   . Orthostatic hypotension   . Osteoarthritis   . Pancreatitis   . Paroxysmal atrial fibrillation (HCC)   . Presence of permanent cardiac pacemaker   . 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 APPENDECTOMY    . BACK SURGERY     bone chip lasered  . CARDIAC CATHETERIZATION    . CATARACT EXTRACTION W/ INTRAOCULAR LENS IMPLANT Right   . COLON SURGERY  1990's   12" removed of large intestine  . EYE SURGERY    . INSERT / REPLACE / [place]  . large intestine block    . PACEMAKER INSERTION     MDT dual chamber pacemaker  . PORTA CATH INSERTION N/A 04/17/2019   Procedure: PORTA CATH INSERTION;  Surgeon: Algernon Huxley, MD;  Location: [facility];  Service: Cardiovascular;  Laterality: N/A;  . PPM GENERATOR CHANGEOUT N/A 12/02/2018   Procedure: PPM GENERATOR CHANGEOUT;  Surgeon: Deboraha Sprang, MD;  Location: [facility];  Service: Cardiovascular;  Laterality: N/A;  . TONSILLECTOMY AND ADENOIDECTOMY    . VIDEO BRONCHOSCOPY WITH ENDOBRONCHIAL ULTRASOUND N/A 04/07/2019   Procedure: VIDEO BRONCHOSCOPY WITH ENDOBRONCHIAL ULTRASOUND;  Surgeon: Tyler Pita, MD;  Location: [facility];  Service: Pulmonary;  Laterality: N/A;    SOCIAL HISTORY: Social History  Socioeconomic History  . Marital status: Married    Spouse name: Not on file  . Number of children: 2  . Years of education: Not on file  . Highest education level: Not on file  Occupational History  . Occupation: Retired  Tobacco Use  . Smoking status: Former Smoker    Packs/day: 1.00    Years: 60.00    Pack years: 60.00    Types: Cigarettes    Quit date: 04/29/2017    Years since quitting: 2.1    . Smokeless tobacco: Never Used  Substance and Sexual Activity  . Alcohol use: Yes    Alcohol/week: 5.0 standard drinks    Types: 5 Glasses of wine per week  . Drug use: No  . Sexual activity: Never  Other Topics Concern  . Not on file  Social History Narrative   Married.   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      Does not have a living will.   Desires CPR, does not want prolonged life support if futile.   Social Determinants of Health   Financial Resource Strain: Low Risk   . Difficulty of Paying Living Expenses: Not hard at all  Food Insecurity: No Food Insecurity  . Worried About Charity fundraiser in the Last Year: Never true  . Ran Out of Food in the Last Year: Never true  Transportation Needs: No Transportation Needs  . Lack of Transportation (Medical): No  . Lack of Transportation (Non-Medical): No  Physical Activity: Sufficiently Active  . Days of Exercise per Week: 7 days  . Minutes of Exercise per Session: 60 min  Stress: No Stress Concern Present  . Feeling of Stress : Not at all  Social Connections:   . Frequency of Communication with Friends and Family:   . Frequency of Social Gatherings with Friends and Family:   . Attends Religious Services:   . Active Member of Clubs or Organizations:   . Attends Archivist Meetings:   Marland Kitchen Marital Status:   Intimate Partner Violence: Not At Risk  . Fear of Current or Ex-Partner: No  . Emotionally Abused: No  . Physically Abused: No  . Sexually Abused: No    FAMILY HISTORY: Family History  Problem Relation Age of Onset  . Lung cancer Mother   . Heart disease Father   . Alzheimer's disease Father     ALLERGIES:  is allergic to sulfa antibiotics and penicillins.  MEDICATIONS:  Current Outpatient Medications  Medication Sig Dispense Refill  . albuterol (PROAIR HFA) 108 (90 Base) MCG/ACT inhaler Inhale 1-2 puffs into the lungs every 6 (six) hours as needed for wheezing or shortness of  breath. 18 g 5  . antiseptic oral rinse (BIOTENE) LIQD 15 mLs by Mouth Rinse route as needed for dry mouth.    . brimonidine (ALPHAGAN) 0.15 % ophthalmic solution Place 1 drop into both eyes 2 (two) times daily.     . Budeson-Glycopyrrol-Formoterol (BREZTRI AEROSPHERE) 160-9-4.8 MCG/ACT AERO Inhale 2 puffs into the lungs 2 (two) times daily. 10.7 g 6  . chlorhexidine (PERIDEX) 0.12 % solution Use as directed 15 mLs in the mouth or throat in the morning and at bedtime. 473 mL 0  . Cholecalciferol (VITAMIN D3) 50 MCG (2000 UT) capsule Take 1 capsule (2,000 Units total) by mouth daily with lunch. 30 capsule 1  . famotidine (PEPCID) 20 MG tablet Take 20 mg by mouth 2 (two) times daily.    . fenofibrate (TRICOR) 145 MG tablet  Take 1 tablet (145 mg total) by mouth daily. 90 tablet 1  . folic acid (FOLVITE) 1 MG tablet Take 1 mg by mouth daily.    . furosemide (LASIX) 20 MG tablet Take 1 tablet (20 mg total) by mouth daily as needed for fluid. 30 tablet 0  . gabapentin (NEURONTIN) 100 MG capsule Take 100 mg by mouth 3 (three) times daily.    Marland Kitchen lidocaine-prilocaine (EMLA) cream Apply to affected area once 30 g 3  . nystatin (MYCOSTATIN) 100000 UNIT/ML suspension Take 5 mLs (500,000 Units total) by mouth 4 (four) times daily. Swish and swallow 473 mL 0  . sodium chloride (OCEAN) 0.65 % SOLN nasal spray Place 1 spray into both nostrils at bedtime as needed for congestion.     . traMADol (ULTRAM) 50 MG tablet Take 25 mg by mouth at bedtime as needed for moderate pain.     . chlorproMAZINE (THORAZINE) 25 MG tablet Take 1 tablet (25 mg total) by mouth 3 (three) times daily. (Patient not taking: Reported on 06/04/2019) 30 tablet 0  . ciprofloxacin (CIPRO) 500 MG tablet Take 1 tablet (500 mg total) by mouth 2 (two) times daily. 14 tablet 0  . dronabinol (MARINOL) 5 MG capsule Take 1 capsule (5 mg total) by mouth 2 (two) times daily before a meal. (Patient not taking: Reported on 06/18/2019) 60 capsule 0  .  methotrexate 2.5 MG tablet Take 12.5 mg by mouth every Sunday.     . ondansetron (ZOFRAN) 8 MG tablet Take 1 tablet (8 mg total) by mouth 2 (two) times daily as needed for refractory nausea / vomiting. Start on day 3 after carboplatin chemo. (Patient not taking: Reported on 06/04/2019) 30 tablet 1  . prochlorperazine (COMPAZINE) 10 MG tablet Take 1 tablet (10 mg total) by mouth every 6 (six) hours as needed (Nausea or vomiting). (Patient not taking: Reported on 06/18/2019) 30 tablet 1   No current facility-administered medications for this visit.     PHYSICAL EXAMINATION: ECOG PERFORMANCE STATUS: 1 - Symptomatic but completely ambulatory Vitals:   06/18/19 1050  BP: 122/82  Pulse: 88  Resp: 18  Temp: (!) 96 F (35.6 C)   Filed Weights   06/18/19 1050  Weight: 163 lb 8 oz (74.2 kg)    Physical Exam Vitals reviewed.  Constitutional:      General: He is not in acute distress.    Comments: Patient walks independently  HENT:     Head: Normocephalic and atraumatic.     Mouth/Throat:     Comments: Ritta Slot has resolved.  Eyes:     General: No scleral icterus. Cardiovascular:     Rate and Rhythm: Normal rate and regular rhythm.     Heart sounds: Normal heart sounds.  Pulmonary:     Effort: Pulmonary effort is normal. No respiratory distress.     Breath sounds: No wheezing or rhonchi.     Comments: Patient has bibasilar fine crackles chronically Abdominal:     General: Bowel sounds are normal. There is no distension.     Palpations: Abdomen is soft.  Musculoskeletal:        General: No deformity. Normal range of motion.     Cervical back: Normal range of motion and neck supple.  Skin:    General: Skin is warm and dry.     Findings: No erythema or rash.  Neurological:     Mental Status: He is alert and oriented to person, place, and time. Mental status is at baseline.  Cranial Nerves: No cranial nerve deficit.     Coordination: Coordination normal.  Psychiatric:         Mood and Affect: Mood normal.     LABORATORY DATA:  I have reviewed the data as listed Lab Results  Component Value Date   WBC 1.0 (LL) 06/18/2019   HGB 9.2 (L) 06/18/2019   HCT 27.7 (L) 06/18/2019   MCV 109.5 (H) 06/18/2019   PLT 52 (L) 06/18/2019   Recent Labs    06/04/19 0826 06/11/19 1002 06/18/19 1002  NA 130* 133* 134*  K 3.6 4.3 4.8  CL 98 101 100  CO2 23 25 25   GLUCOSE 123* 119* 104*  BUN 9 24* 16  CREATININE 1.01 0.99 1.12  CALCIUM 8.4* 8.8* 8.9  GFRNONAA >60 >60 >60  GFRAA >60 >60 >60  PROT 6.0* 6.1* 6.6  ALBUMIN 3.0* 3.1* 3.5  AST 30 29 31   ALT 21 20 19   ALKPHOS 107 90 104  BILITOT 0.6 1.1 0.8   Iron/TIBC/Ferritin/ %Sat No results found for: IRON, TIBC, FERRITIN, IRONPCTSAT    RADIOGRAPHIC STUDIES: I have personally reviewed the radiological images as listed and agreed with the findings in the report.  DG Chest 2 View  Result Date: 05/27/2019 CLINICAL DATA:  Suspected sepsis. EXAM: CHEST - 2 VIEW COMPARISON:  PET-CT 03/20/2019. Chest CT 02/27/2019. Chest x-ray 04/11/2014. FINDINGS: PowerPort catheter noted with tip over the cavoatrial junction. Cardiac pacer stable position. Heart size normal. COPD. Chronic bilateral interstitial prominence noted. Superimposed active pneumonitis cannot be excluded. No focal alveolar infiltrate. No pleural effusion or pneumothorax. IMPRESSION: 1.  PowerPort catheter noted with tip over SVC. 2.  Cardiac pacer stable position.  Heart size normal. 3. COPD. Chronic interstitial disease. Superimposed active pneumonitis cannot be excluded. No focal alveolar infiltrate noted. Electronically Signed   By: Marcello Moores  Register   On: 05/27/2019 12:21   CT ANGIO CHEST PE W OR WO CONTRAST  Result Date: 05/27/2019 CLINICAL DATA:  79 year old male with small cell lung cancer on radiation treatment and chemotherapy. Fever and shortness of breath. EXAM: CT ANGIOGRAPHY CHEST WITH CONTRAST TECHNIQUE: Multidetector CT imaging of the chest was  performed using the standard protocol during bolus administration of intravenous contrast. Multiplanar CT image reconstructions and MIPs were obtained to evaluate the vascular anatomy. CONTRAST:  56mL OMNIPAQUE IOHEXOL 350 MG/ML SOLN COMPARISON:  Chest radiograph dated 05/27/2019 and CT dated 02/27/2019 FINDINGS: Cardiovascular: There is no cardiomegaly or pericardial effusion. Coronary vascular calcification primarily involving the LAD. Left pectoral pacemaker device noted. There is advanced atherosclerotic calcification of the thoracic aorta. No aneurysmal dilatation or dissection. There is no CT evidence of pulmonary embolism. Mediastinum/Nodes: Significant interval decrease in the size of the enlarged lymph node seen on the prior CT anterior to the carina now measuring approximately 11 mm in short axis (previously 21 mm). Decrease in the size of subcarinal lymph node now measuring approximately 9 mm in short axis. No new adenopathy. There is a small hiatal hernia. There is mild circumferential thickening of the esophagus with infiltration of the surrounding fat which may represent esophagitis. Clinical correlation is recommended. No mediastinal fluid collection. Lungs/Pleura: There is a background of emphysema. The previously seen right upper lobe subpleural lesion is not identified on today's exam. A 2 mm subpleural nodule (63/6) may correspond to the previously seen 6 mm nodule. A 7 mm nodule at the left lung base (71/6) has minimally decreased in size (previously measuring approximately 8 mm). There is trace right pleural effusion,  new since the prior CT. No consolidative changes or pneumothorax. The central airways are patent. Upper Abdomen: There is scattered colonic diverticula. Apparent area of colonic thickening and mild pericolonic stranding in the right upper abdomen (series 4 image 100 and coronal series 7, image 22) is suboptimally evaluated due to partial visualization but is concerning for  diverticulitis. CT of the abdomen pelvis may provide better evaluation. Musculoskeletal: Osteopenia with mild degenerative changes. No acute osseous pathology. Review of the MIP images confirms the above findings. IMPRESSION: 1. No acute intrathoracic pathology. No CT evidence of pulmonary embolism. 2. Significant interval decrease in the size of the previously seen mediastinal lymph nodes. No new adenopathy. 3. Near complete resolution of the previously seen right upper lobe hypermetabolic subpleural nodule. 4. Thickened appearance of the esophagus concerning for esophagitis. Clinical correlation is recommended. 5. Partially visualized area of colonic thickening and pericolonic stranding in the right upper abdomen, concerning for diverticulitis. CT of the abdomen pelvis may provide better evaluation. 6. Aortic Atherosclerosis (ICD10-I70.0) and Emphysema (ICD10-J43.9). Electronically Signed   By: Anner Crete M.D.   On: 05/27/2019 15:37   CT ABDOMEN PELVIS W CONTRAST  Result Date: 05/28/2019 CLINICAL DATA:  Fever of unknown origin.  Small cell lung carcinoma. EXAM: CT ABDOMEN AND PELVIS WITH CONTRAST TECHNIQUE: Multidetector CT imaging of the abdomen and pelvis was performed using the standard protocol following bolus administration of intravenous contrast. CONTRAST:  100 mL OMNIPAQUE IOHEXOL 300 MG/ML  SOLN COMPARISON:  CT chest 05/27/2019.  PET CT scan 03/20/2019. FINDINGS: Lower chest: Lung bases again demonstrate severe emphysematous disease and basilar fibrosis. Small right pleural effusion noted. No left pleural effusion or pericardial effusion. Hepatobiliary: No focal liver abnormality is seen. No gallstones, gallbladder wall thickening, or biliary dilatation. Pancreas: Unremarkable. No pancreatic ductal dilatation or surrounding inflammatory changes. Spleen: Normal in size without focal abnormality. Adrenals/Urinary Tract: Adrenal glands are unremarkable. Small bilateral renal cysts are unchanged. The  kidneys otherwise appear normal. Ureters and urinary bladder are unremarkable. Bladder is unremarkable. Stomach/Bowel: There are areas of marked wall thickening of the colon with surrounding stranding, most notable in the proximal to mid transverse and mid to distal descending consistent with colitis. The appendix has been removed. The patient has a small hiatal hernia. The stomach is otherwise unremarkable. Small bowel appears normal. Vascular/Lymphatic: Atherosclerosis is noted. The descending abdominal aorta measures up to 2.7 cm in diameter, unchanged. Reproductive: Prostate is unremarkable. Other: None. Musculoskeletal: No acute or focal abnormality. IMPRESSION: The examination is positive for segmental infectious or inflammatory colitis most notable in the proximal to mid transverse and distal descending. No other acute abnormality. Severe emphysema. Small right pleural effusion. Atherosclerosis. Small hiatal hernia. Electronically Signed   By: Inge Rise M.D.   On: 05/28/2019 14:04   US Venous Img Lower Bilateral  Result Date: 06/04/2019 CLINICAL DATA:  Bilateral leg edema. EXAM: BILATERAL LOWER EXTREMITY VENOUS DOPPLER ULTRASOUND TECHNIQUE: Gray-scale sonography with graded compression, as well as color Doppler and duplex ultrasound were performed to evaluate the lower extremity deep venous systems from the level of the common femoral vein and including the common femoral, femoral, profunda femoral, popliteal and calf veins including the posterior tibial, peroneal and gastrocnemius veins when visible. The superficial great saphenous vein was also interrogated. Spectral Doppler was utilized to evaluate flow at rest and with distal augmentation maneuvers in the common femoral, femoral and popliteal veins. COMPARISON:  None. FINDINGS: RIGHT LOWER EXTREMITY Common Femoral Vein: No evidence of thrombus. Normal compressibility, respiratory  phasicity and response to augmentation. Saphenofemoral Junction:  No evidence of thrombus. Normal compressibility and flow on color Doppler imaging. Profunda Femoral Vein: No evidence of thrombus. Normal compressibility and flow on color Doppler imaging. Femoral Vein: No evidence of thrombus. Normal compressibility, respiratory phasicity and response to augmentation. Popliteal Vein: No evidence of thrombus. Normal compressibility, respiratory phasicity and response to augmentation. Calf Veins: No evidence of thrombus. Normal compressibility and flow on color Doppler imaging. LEFT LOWER EXTREMITY Common Femoral Vein: No evidence of thrombus. Normal compressibility, respiratory phasicity and response to augmentation. Saphenofemoral Junction: No evidence of thrombus. Normal compressibility and flow on color Doppler imaging. Profunda Femoral Vein: No evidence of thrombus. Normal compressibility and flow on color Doppler imaging. Femoral Vein: No evidence of thrombus. Normal compressibility, respiratory phasicity and response to augmentation. Popliteal Vein: No evidence of thrombus. Normal compressibility, respiratory phasicity and response to augmentation. Calf Veins: No evidence of thrombus. Normal compressibility and flow on color Doppler imaging. Other Findings:  None. IMPRESSION: No evidence of deep venous thrombosis in either lower extremity. Electronically Signed   By: Markus Daft M.D.   On: 06/04/2019 15:38      ASSESSMENT & PLAN:  1. 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)   2. Chemotherapy-induced neutropenia (HCC)   3. Rheumatoid arthritis, involving unspecified site, unspecified whether rheumatoid factor present ([place])   4. Thrombocytopenia ([place])    #Right lung high-grade poorly differentiated carcinoma-suspect small cell lung cancer.  Clinically T1c N2 M0.  Currently on concurrent chemotherapy and radiation.  Patient has 3 more days of radiation to complete his course. Labs reviewed and discussed with patient  Neutropenia, ANC 0.4.  No growth factor support due  to being on radiation Continue Peridex oral rinse twice daily and nystatin swish and swallow for prophylaxis.  Ritta Slot has completely resolved Recommend patient to take Cipro 500 mg twice daily for 7 days as prophylaxis given expected prolonged neutropenia.  Neutropenia precaution was discussed with patient.  #Anemia, hemoglobin 9.2, due to chemo and RT. Monitor.  Stable #Rheumatoid arthritis, currently off methotrexate. Continue folic acid supplementation. 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   Once his counts recovers after finishing course of treatments, he may resume methotrexate.  We will further discuss with him at next visit. # currently off eliquis, plan to resume once he finished treatments.  #Continue Pepcid and sucralfate for radiation esophagitis prophylaxis.  All questions were answered. The patient knows to call the clinic with any problems questions or concerns. Follow up appointment: 1 week    Earlie Server, MD, PhD Hematology Oncology [facility] Pager- [phone] 06/18/2019

## 2019-06-18 NOTE — Progress Notes (Signed)
Patient does not offer any problems today.  

## 2019-06-19 ENCOUNTER — Other Ambulatory Visit: Payer: Medicare PPO

## 2019-06-19 ENCOUNTER — Ambulatory Visit: Payer: Medicare PPO

## 2019-06-19 ENCOUNTER — Ambulatory Visit
Admission: RE | Admit: 2019-06-19 | Discharge: 2019-06-19 | Disposition: A | Discharge status: Home / Self Care | Payer: Medicare PPO | Source: Ambulatory Visit | Attending: Radiation Oncology | Admitting: Radiation Oncology

## 2019-06-19 ENCOUNTER — Inpatient Hospital Stay (HOSPITAL_BASED_OUTPATIENT_CLINIC_OR_DEPARTMENT_OTHER): Payer: Medicare PPO | Admitting: Hospice and Palliative Medicine

## 2019-06-19 DIAGNOSIS — C3431 Malignant neoplasm of lower lobe, right bronchus or lung: Secondary | ICD-10-CM | POA: Diagnosis not present

## 2019-06-19 DIAGNOSIS — Z515 Encounter for palliative care: Secondary | ICD-10-CM

## 2019-06-19 NOTE — Progress Notes (Signed)
I not reach patient by phone.  Voicemail left.  Will reschedule.

## 2019-06-20 ENCOUNTER — Ambulatory Visit
Admission: RE | Admit: 2019-06-20 | Discharge: 2019-06-20 | Disposition: A | Discharge status: Home / Self Care | Payer: Medicare PPO | Source: Ambulatory Visit | Attending: Radiation Oncology | Admitting: Radiation Oncology

## 2019-06-20 DIAGNOSIS — C3431 Malignant neoplasm of lower lobe, right bronchus or lung: Secondary | ICD-10-CM | POA: Diagnosis not present

## 2019-06-23 ENCOUNTER — Ambulatory Visit
Admission: RE | Admit: 2019-06-23 | Discharge: 2019-06-23 | Disposition: A | Discharge status: Home / Self Care | Payer: Medicare PPO | Source: Ambulatory Visit | Attending: Radiation Oncology | Admitting: Radiation Oncology

## 2019-06-23 DIAGNOSIS — C3431 Malignant neoplasm of lower lobe, right bronchus or lung: Secondary | ICD-10-CM | POA: Insufficient documentation

## 2019-06-23 DIAGNOSIS — R918 Other nonspecific abnormal finding of lung field: Secondary | ICD-10-CM | POA: Insufficient documentation

## 2019-06-25 ENCOUNTER — Ambulatory Visit: Payer: Medicare PPO | Admitting: Oncology

## 2019-06-25 ENCOUNTER — Inpatient Hospital Stay: Payer: Medicare PPO

## 2019-06-25 ENCOUNTER — Other Ambulatory Visit: Payer: Self-pay

## 2019-06-25 ENCOUNTER — Inpatient Hospital Stay: Payer: Medicare PPO | Attending: Oncology

## 2019-06-25 ENCOUNTER — Other Ambulatory Visit: Payer: Medicare PPO

## 2019-06-25 ENCOUNTER — Ambulatory Visit: Payer: Medicare PPO

## 2019-06-25 ENCOUNTER — Encounter: Payer: Self-pay | Admitting: Oncology

## 2019-06-25 ENCOUNTER — Inpatient Hospital Stay: Payer: Medicare PPO | Admitting: Oncology

## 2019-06-25 VITALS — BP 115/73 | HR 84 | Temp 97.1°F | Resp 18 | Wt 165.8 lb

## 2019-06-25 DIAGNOSIS — R5383 Other fatigue: Secondary | ICD-10-CM | POA: Insufficient documentation

## 2019-06-25 DIAGNOSIS — B370 Candidal stomatitis: Secondary | ICD-10-CM | POA: Diagnosis not present

## 2019-06-25 DIAGNOSIS — D701 Agranulocytosis secondary to cancer chemotherapy: Secondary | ICD-10-CM

## 2019-06-25 DIAGNOSIS — K219 Gastro-esophageal reflux disease without esophagitis: Secondary | ICD-10-CM | POA: Insufficient documentation

## 2019-06-25 DIAGNOSIS — C3490 Malignant neoplasm of unspecified part of unspecified bronchus or lung: Secondary | ICD-10-CM

## 2019-06-25 DIAGNOSIS — J90 Pleural effusion, not elsewhere classified: Secondary | ICD-10-CM | POA: Diagnosis not present

## 2019-06-25 DIAGNOSIS — J449 Chronic obstructive pulmonary disease, unspecified: Secondary | ICD-10-CM | POA: Diagnosis not present

## 2019-06-25 DIAGNOSIS — Z7901 Long term (current) use of anticoagulants: Secondary | ICD-10-CM | POA: Insufficient documentation

## 2019-06-25 DIAGNOSIS — N281 Cyst of kidney, acquired: Secondary | ICD-10-CM | POA: Insufficient documentation

## 2019-06-25 DIAGNOSIS — R531 Weakness: Secondary | ICD-10-CM | POA: Diagnosis not present

## 2019-06-25 DIAGNOSIS — T451X5A Adverse effect of antineoplastic and immunosuppressive drugs, initial encounter: Secondary | ICD-10-CM | POA: Diagnosis not present

## 2019-06-25 DIAGNOSIS — D696 Thrombocytopenia, unspecified: Secondary | ICD-10-CM | POA: Insufficient documentation

## 2019-06-25 DIAGNOSIS — Z7289 Other problems related to lifestyle: Secondary | ICD-10-CM | POA: Insufficient documentation

## 2019-06-25 DIAGNOSIS — Z801 Family history of malignant neoplasm of trachea, bronchus and lung: Secondary | ICD-10-CM | POA: Insufficient documentation

## 2019-06-25 DIAGNOSIS — R600 Localized edema: Secondary | ICD-10-CM | POA: Insufficient documentation

## 2019-06-25 DIAGNOSIS — K449 Diaphragmatic hernia without obstruction or gangrene: Secondary | ICD-10-CM | POA: Insufficient documentation

## 2019-06-25 DIAGNOSIS — Z79899 Other long term (current) drug therapy: Secondary | ICD-10-CM | POA: Diagnosis not present

## 2019-06-25 DIAGNOSIS — C771 Secondary and unspecified malignant neoplasm of intrathoracic lymph nodes: Secondary | ICD-10-CM | POA: Diagnosis not present

## 2019-06-25 DIAGNOSIS — M8580 Other specified disorders of bone density and structure, unspecified site: Secondary | ICD-10-CM | POA: Insufficient documentation

## 2019-06-25 DIAGNOSIS — I700 Atherosclerosis of aorta: Secondary | ICD-10-CM | POA: Insufficient documentation

## 2019-06-25 DIAGNOSIS — Z87891 Personal history of nicotine dependence: Secondary | ICD-10-CM | POA: Insufficient documentation

## 2019-06-25 DIAGNOSIS — E785 Hyperlipidemia, unspecified: Secondary | ICD-10-CM | POA: Diagnosis not present

## 2019-06-25 DIAGNOSIS — Z8249 Family history of ischemic heart disease and other diseases of the circulatory system: Secondary | ICD-10-CM | POA: Insufficient documentation

## 2019-06-25 DIAGNOSIS — M069 Rheumatoid arthritis, unspecified: Secondary | ICD-10-CM | POA: Diagnosis not present

## 2019-06-25 DIAGNOSIS — C3411 Malignant neoplasm of upper lobe, right bronchus or lung: Secondary | ICD-10-CM | POA: Diagnosis not present

## 2019-06-25 DIAGNOSIS — Z923 Personal history of irradiation: Secondary | ICD-10-CM | POA: Insufficient documentation

## 2019-06-25 DIAGNOSIS — Z882 Allergy status to sulfonamides status: Secondary | ICD-10-CM | POA: Insufficient documentation

## 2019-06-25 DIAGNOSIS — Z818 Family history of other mental and behavioral disorders: Secondary | ICD-10-CM | POA: Insufficient documentation

## 2019-06-25 DIAGNOSIS — I480 Paroxysmal atrial fibrillation: Secondary | ICD-10-CM | POA: Diagnosis not present

## 2019-06-25 DIAGNOSIS — Z880 Allergy status to penicillin: Secondary | ICD-10-CM | POA: Insufficient documentation

## 2019-06-25 DIAGNOSIS — R040 Epistaxis: Secondary | ICD-10-CM | POA: Diagnosis not present

## 2019-06-25 DIAGNOSIS — J439 Emphysema, unspecified: Secondary | ICD-10-CM | POA: Insufficient documentation

## 2019-06-25 LAB — COMPREHENSIVE METABOLIC PANEL
ALT: 16 U/L (ref 0–44)
AST: 30 U/L (ref 15–41)
Albumin: 3.3 g/dL — ABNORMAL LOW (ref 3.5–5.0)
Alkaline Phosphatase: 86 U/L (ref 38–126)
Anion gap: 7 (ref 5–15)
BUN: 16 mg/dL (ref 8–23)
CO2: 23 mmol/L (ref 22–32)
Calcium: 8.6 mg/dL — ABNORMAL LOW (ref 8.9–10.3)
Chloride: 103 mmol/L (ref 98–111)
Creatinine, Ser: 1.16 mg/dL (ref 0.61–1.24)
GFR calc Af Amer: >60 mL/min (ref >60)
GFR calc non Af Amer: 60 mL/min — ABNORMAL LOW (ref >60)
Glucose, Bld: 135 mg/dL — ABNORMAL HIGH (ref 70–99)
Potassium: 4 mmol/L (ref 3.5–5.1)
Sodium: 133 mmol/L — ABNORMAL LOW (ref 135–145)
Total Bilirubin: 0.7 mg/dL (ref 0.3–1.2)
Total Protein: 5.9 g/dL — ABNORMAL LOW (ref 6.5–8.1)

## 2019-06-25 LAB — CBC WITH DIFFERENTIAL/PLATELET
Abs Immature Granulocytes: 0.02 10*3/uL (ref 0.00–0.07)
Basophils Absolute: 0 10*3/uL (ref 0.0–0.1)
Basophils Relative: 1 %
Eosinophils Absolute: 0 10*3/uL (ref 0.0–0.5)
Eosinophils Relative: 1 %
HCT: 28 % — ABNORMAL LOW (ref 39.0–52.0)
Hemoglobin: 9.5 g/dL — ABNORMAL LOW (ref 13.0–17.0)
Immature Granulocytes: 1 %
Lymphocytes Relative: 24 %
Lymphs Abs: 0.4 10*3/uL — ABNORMAL LOW (ref 0.7–4.0)
MCH: 37.7 pg — ABNORMAL HIGH (ref 26.0–34.0)
MCHC: 33.9 g/dL (ref 30.0–36.0)
MCV: 111.1 fL — ABNORMAL HIGH (ref 80.0–100.0)
Monocytes Absolute: 0.5 10*3/uL (ref 0.1–1.0)
Monocytes Relative: 30 %
Neutro Abs: 0.7 10*3/uL — ABNORMAL LOW (ref 1.7–7.7)
Neutrophils Relative %: 43 %
Platelets: 108 10*3/uL — ABNORMAL LOW (ref 150–400)
RBC: 2.52 MIL/uL — ABNORMAL LOW (ref 4.22–5.81)
RDW: 20 % — ABNORMAL HIGH (ref 11.5–15.5)
WBC: 1.6 10*3/uL — ABNORMAL LOW (ref 4.0–10.5)
nRBC: 0 % (ref 0.0–0.2)

## 2019-06-25 MED ORDER — HEPARIN SOD (PORK) LOCK FLUSH 100 UNIT/ML IV SOLN
500.0000 [IU] | Freq: Once | INTRAVENOUS | Status: AC
  Administered 2019-06-25: 500 [IU] via INTRAVENOUS
  Filled 2019-06-25: qty 5

## 2019-06-25 MED ORDER — SODIUM CHLORIDE 0.9% FLUSH
10.0000 mL | Freq: Once | INTRAVENOUS | Status: AC
  Administered 2019-06-25: 10 mL via INTRAVENOUS
  Filled 2019-06-25: qty 10

## 2019-06-25 NOTE — Progress Notes (Signed)
Patient reports no problems today.

## 2019-06-25 NOTE — Progress Notes (Signed)
Hematology/Oncology follow up note [facility] Telephone:([phone] [phone]   Patient Care Team: Pleas Koch, NP as PCP - General (Internal Medicine) Deboraha Sprang, MD as Consulting Physician (Cardiology) Marlowe Sax, MD as Referring Physician (Internal Medicine) Birder Robson, MD as Referring Physician (Ophthalmology) Oneta Rack, MD as Referring Physician (Dermatology) Salli Real, DDS as Referring Physician (Dentistry) Telford Nab, RN as Oncology Nurse Navigator Earlie Server, MD as Consulting Physician (Hematology and Oncology)  REFERRING PROVIDER: Pleas Koch, NP  CHIEF COMPLAINTS/REASON FOR VISIT:  Follow-up for management of lung cancer  HISTORY OF PRESENTING ILLNESS:   Darryl White is a  79 y.o.  male with PMH listed below was seen in consultation at the request of  Pleas Koch, NP  for evaluation of lung mass April 2020 patient was evaluated by radiation oncology Dr. Baruch Gouty for right lung nodules concerning for progressive malignancy.   Patient had progressively enlarging media right lower lobe which was previously treated in April 2020 empirically with SBRT.  Biopsy was not done due to poor lung function secondary to COPD/moderate fibrosis. Patient also had a right upper lobe nodule being closely watched, and was found to have progressively enlarged on 10/29/2018 CT chest. 02/27/2019 patient had another CT done which showed marked progression of precarinal lymph node and a continued progression of peripheral right upper lobe pulmonary nodule. 03/20/2019 PET scan showed hypermetabolic right lower paratracheal and subcarinal adenopathy with hypermetabolic right upper lobe subpleural nodule, or significantly worsened compared to prior CT scan. Patient also had 1.2 x 1 cm left upper lobe confluent groundglass densities with low metabolic activity. Patient was referred to establish care with oncology  for further evaluation and management.  Patient reports chronic shortness of breath with exertion.  He follows up with pulmonology and was recently seen by Dr. Patsey Berthold.  Denies any hemoptysis,.  He has a history of extensive smoking history, 67-pack-year, quit in 2019. He lives with wife at home. -CT head is negative. MRI is contraindicated due to pace marker.   # PET scan images were reviewed and discussed with patient. Also discussed with patient about pathology report Pre-carinal lymph node EBUS guided FNA showed malignancy, metastatic high-grade carcinoma. 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  There are patchy areas with focal weak staining for CD56-which is a neuroendocrine marker.  However many clusters of tumor cells are negative.  While the morphology alone would support a diagnosis of small cell carcinoma, there is insufficient immunohistochemistry evidence to support any specific tumor differentiation. I discussed the possibility of small cell lung cancer which is more aggressive in nature and is related with worse outcome. Regimen with concurrent radiation and chemotherapy with carboplatin and taxol would not be sufficient for small cell lung cancer treatment.We will treat the benefit of doubt using small cell lung cancer regimen, he agrees with the plan. I confirmed with Dr. 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  # Patient developed multiple epistaxis episodes during the interval.  Some episodes takes about 20 to 30 minutes to stop.  Patient called RN navigator and I advised patient to stop Eliquis.  Patient has been on Eliquis 5 mg twice daily due to cardiology diseases.  Patient has contacted cardiology and [name] recommend  patient to hold off  Eliquis for now  # dmitted from 05/27/2019-05/30/2019 due to neutropenic fever. He has an Pritchett of 0.1. Urine and blood culture remained negative. CT abdomen concerning for colitis. Patient was treated with empiric broad-spectrum antibiotics and his symptoms improved. His ANC improved to 0.6 and patient was discharged on Keflex and Flagyl for 3 more days to complete treatment of colitis.  INTERVAL HISTORY Darryl White is a 79 y.o. male who has above history reviewed by me today presents for follow up acute visit for chemotherapy radiation for lung cancer-high-grade carcinoma,  Problems and complaints are listed below: Patient has finished radiation course. Today he has no new complaints. Denies any fever, chills, nausea, vomiting. Patient has no new complaints. Patient has gained weight.    Review of Systems  Constitutional: Positive for fatigue. Negative for appetite change, chills, fever and unexpected weight change.  HENT:   Negative for hearing loss, nosebleeds and voice change.   Eyes: Negative for eye problems and icterus.  Respiratory: Negative for chest tightness, cough and shortness of breath.   Cardiovascular: Negative for chest pain and leg swelling.  Gastrointestinal: Negative for abdominal distention and abdominal pain.  Endocrine: Negative for hot flashes.  Genitourinary: Negative for difficulty urinating, dysuria and frequency.   Musculoskeletal: Negative for arthralgias.  Skin: Negative for itching and rash.  Neurological: Negative for light-headedness and numbness.  Hematological: Negative for adenopathy. Does not bruise/bleed easily.  Psychiatric/Behavioral: Negative for confusion.    MEDICAL HISTORY:  Past Medical History:  Diagnosis Date  . Arthritis 2019   dx of osteoarthritis and rhemautoid arthritis  . Cancer ([place])    lung  . Cataract 2019   left eye  . Chicken pox   . Collagen vascular disease ([place])   . 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  s/p MDT dual chamber PPM  . COPD (chronic obstructive pulmonary disease) ([place])   . Diverticulitis   . GERD (gastroesophageal reflux disease)   . 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 History of hiatal hernia   . History of stomach ulcers   . Hyperlipidemia   . Lymphocytic colitis   . Orthostatic hypotension   . Osteoarthritis   . Pancreatitis   . Paroxysmal atrial fibrillation (HCC)   . Presence of permanent cardiac pacemaker   . 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 APPENDECTOMY    . BACK SURGERY     bone chip lasered  . CARDIAC CATHETERIZATION    . CATARACT EXTRACTION W/ INTRAOCULAR LENS IMPLANT Right   . COLON SURGERY  1990's   12" removed of large intestine  . EYE SURGERY    . INSERT / REPLACE / [place]  . large intestine block    . PACEMAKER INSERTION     MDT dual chamber pacemaker  . PORTA CATH INSERTION N/A 04/17/2019   Procedure: PORTA CATH INSERTION;  Surgeon: Algernon Huxley, MD;  Location: [place] [facility];  Service: Cardiovascular;  Laterality: N/A;  . PPM GENERATOR CHANGEOUT N/A 12/02/2018   Procedure: PPM GENERATOR CHANGEOUT;  Surgeon: Deboraha Sprang, MD;  Location: [place] [facility];  Service: Cardiovascular;  Laterality: N/A;  . TONSILLECTOMY AND ADENOIDECTOMY    . VIDEO BRONCHOSCOPY WITH ENDOBRONCHIAL ULTRASOUND N/A 04/07/2019   Procedure: VIDEO BRONCHOSCOPY WITH ENDOBRONCHIAL ULTRASOUND;  Surgeon: Tyler Pita, MD;  Location: [facility];  Service: Pulmonary;  Laterality: N/A;  SOCIAL HISTORY: Social History   Socioeconomic History  . Marital status: Married    Spouse name: Not on file  . Number of children: 2  . Years of education: Not on file  . Highest education level: Not on file  Occupational History  . Occupation: Retired  Tobacco Use  . Smoking status: Former Smoker    Packs/day: 1.00    Years: 60.00    Pack years: 60.00    Types: Cigarettes     Quit date: 04/29/2017    Years since quitting: 2.1  . Smokeless tobacco: Never Used  Substance and Sexual Activity  . Alcohol use: Yes    Alcohol/week: 5.0 standard drinks    Types: 5 Glasses of wine per week  . Drug use: No  . Sexual activity: Never  Other Topics Concern  . Not on file  Social History Narrative   Married.   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      Does not have a living will.   Desires CPR, does not want prolonged life support if futile.   Social Determinants of Health   Financial Resource Strain: Low Risk   . Difficulty of Paying Living Expenses: Not hard at all  Food Insecurity: No Food Insecurity  . Worried About Charity fundraiser in the Last Year: Never true  . Ran Out of Food in the Last Year: Never true  Transportation Needs: No Transportation Needs  . Lack of Transportation (Medical): No  . Lack of Transportation (Non-Medical): No  Physical Activity: Sufficiently Active  . Days of Exercise per Week: 7 days  . Minutes of Exercise per Session: 60 min  Stress: No Stress Concern Present  . Feeling of Stress : Not at all  Social Connections:   . Frequency of Communication with Friends and Family:   . Frequency of Social Gatherings with Friends and Family:   . Attends Religious Services:   . Active Member of Clubs or Organizations:   . Attends Archivist Meetings:   Marland Kitchen Marital Status:   Intimate Partner Violence: Not At Risk  . Fear of Current or Ex-Partner: No  . Emotionally Abused: No  . Physically Abused: No  . Sexually Abused: No    FAMILY HISTORY: Family History  Problem Relation Age of Onset  . Lung cancer Mother   . Heart disease Father   . Alzheimer's disease Father     ALLERGIES:  is allergic to sulfa antibiotics and penicillins.  MEDICATIONS:  Current Outpatient Medications  Medication Sig Dispense Refill  . albuterol (PROAIR HFA) 108 (90 Base) MCG/ACT inhaler Inhale 1-2 puffs into the lungs every 6  (six) hours as needed for wheezing or shortness of breath. 18 g 5  . antiseptic oral rinse (BIOTENE) LIQD 15 mLs by Mouth Rinse route as needed for dry mouth.    . brimonidine (ALPHAGAN) 0.15 % ophthalmic solution Place 1 drop into both eyes 2 (two) times daily.     . Budeson-Glycopyrrol-Formoterol (BREZTRI AEROSPHERE) 160-9-4.8 MCG/ACT AERO Inhale 2 puffs into the lungs 2 (two) times daily. 10.7 g 6  . chlorhexidine (PERIDEX) 0.12 % solution Use as directed 15 mLs in the mouth or throat in the morning and at bedtime. 473 mL 0  . Cholecalciferol (VITAMIN D3) 50 MCG (2000 UT) capsule Take 1 capsule (2,000 Units total) by mouth daily with lunch. 30 capsule 1  . ciprofloxacin (CIPRO) 500 MG tablet Take 1 tablet (500 mg total) by mouth 2 (two) times daily. [place]  tablet 0  . famotidine (PEPCID) 20 MG tablet Take 20 mg by mouth 2 (two) times daily.    . fenofibrate (TRICOR) 145 MG tablet Take 1 tablet (145 mg total) by mouth daily. 90 tablet 1  . folic acid (FOLVITE) 1 MG tablet Take 1 mg by mouth daily.    . furosemide (LASIX) 20 MG tablet Take 1 tablet (20 mg total) by mouth daily as needed for fluid. 30 tablet 0  . gabapentin (NEURONTIN) 100 MG capsule Take 100 mg by mouth 3 (three) times daily.    Marland Kitchen lidocaine-prilocaine (EMLA) cream Apply to affected area once 30 g 3  . nystatin (MYCOSTATIN) 100000 UNIT/ML suspension Take 5 mLs (500,000 Units total) by mouth 4 (four) times daily. Swish and swallow 473 mL 0  . sodium chloride (OCEAN) 0.65 % SOLN nasal spray Place 1 spray into both nostrils at bedtime as needed for congestion.     . traMADol (ULTRAM) 50 MG tablet Take 25 mg by mouth at bedtime as needed for moderate pain.     . chlorproMAZINE (THORAZINE) 25 MG tablet Take 1 tablet (25 mg total) by mouth 3 (three) times daily. (Patient not taking: Reported on 06/04/2019) 30 tablet 0  . dronabinol (MARINOL) 5 MG capsule Take 1 capsule (5 mg total) by mouth 2 (two) times daily before a meal. (Patient not  taking: Reported on 06/18/2019) 60 capsule 0  . methotrexate 2.5 MG tablet Take 12.5 mg by mouth every Sunday.     . ondansetron (ZOFRAN) 8 MG tablet Take 1 tablet (8 mg total) by mouth 2 (two) times daily as needed for refractory nausea / vomiting. Start on day 3 after carboplatin chemo. (Patient not taking: Reported on 06/04/2019) 30 tablet 1  . prochlorperazine (COMPAZINE) 10 MG tablet Take 1 tablet (10 mg total) by mouth every 6 (six) hours as needed (Nausea or vomiting). (Patient not taking: Reported on 06/18/2019) 30 tablet 1   No current facility-administered medications for this visit.     PHYSICAL EXAMINATION: ECOG PERFORMANCE STATUS: 1 - Symptomatic but completely ambulatory Vitals:   06/25/19 1356  BP: 115/73  Pulse: 84  Resp: 18  Temp: (!) 97.1 F (36.2 C)   Filed Weights   06/25/19 1356  Weight: 165 lb 12.8 oz (75.2 kg)    Physical Exam Vitals reviewed.  Constitutional:      General: He is not in acute distress.    Comments: Patient walks independently  HENT:     Head: Normocephalic and atraumatic.     Mouth/Throat:     Comments: Ritta Slot has resolved.  Eyes:     General: No scleral icterus. Cardiovascular:     Rate and Rhythm: Normal rate and regular rhythm.     Heart sounds: Normal heart sounds.  Pulmonary:     Effort: Pulmonary effort is normal. No respiratory distress.     Breath sounds: No wheezing or rhonchi.     Comments: Patient has bibasilar fine crackles chronically Abdominal:     General: Bowel sounds are normal. There is no distension.     Palpations: Abdomen is soft.  Musculoskeletal:        General: No deformity. Normal range of motion.     Cervical back: Normal range of motion and neck supple.  Skin:    General: Skin is warm and dry.     Findings: No erythema or rash.  Neurological:     Mental Status: He is alert and oriented to person, place, and time.  Mental status is at baseline.     Cranial Nerves: No cranial nerve deficit.      Coordination: Coordination normal.  Psychiatric:        Mood and Affect: Mood normal.     LABORATORY DATA:  I have reviewed the data as listed Lab Results  Component Value Date   WBC 1.6 (L) 06/25/2019   HGB 9.5 (L) 06/25/2019   HCT 28.0 (L) 06/25/2019   MCV 111.1 (H) 06/25/2019   PLT 108 (L) 06/25/2019   Recent Labs    06/11/19 1002 06/18/19 1002 06/25/19 1315  NA 133* 134* 133*  K 4.3 4.8 4.0  CL 101 100 103  CO2 25 25 23   GLUCOSE 119* 104* 135*  BUN 24* 16 16  CREATININE 0.99 1.12 1.16  CALCIUM 8.8* 8.9 8.6*  GFRNONAA >60 >60 60*  GFRAA >60 >60 >60  PROT 6.1* 6.6 5.9*  ALBUMIN 3.1* 3.5 3.3*  AST 29 31 30   ALT 20 19 16   ALKPHOS 90 104 86  BILITOT 1.1 0.8 0.7   Iron/TIBC/Ferritin/ %Sat No results found for: IRON, TIBC, FERRITIN, IRONPCTSAT    RADIOGRAPHIC STUDIES: I have personally reviewed the radiological images as listed and agreed with the findings in the report.  DG Chest 2 View  Result Date: 05/27/2019 CLINICAL DATA:  Suspected sepsis. EXAM: CHEST - 2 VIEW COMPARISON:  PET-CT 03/20/2019. Chest CT 02/27/2019. Chest x-ray 04/11/2014. FINDINGS: PowerPort catheter noted with tip over the cavoatrial junction. Cardiac pacer stable position. Heart size normal. COPD. Chronic bilateral interstitial prominence noted. Superimposed active pneumonitis cannot be excluded. No focal alveolar infiltrate. No pleural effusion or pneumothorax. IMPRESSION: 1.  PowerPort catheter noted with tip over SVC. 2.  Cardiac pacer stable position.  Heart size normal. 3. COPD. Chronic interstitial disease. Superimposed active pneumonitis cannot be excluded. No focal alveolar infiltrate noted. Electronically Signed   By: Marcello Moores  Register   On: 05/27/2019 12:21   CT ANGIO CHEST PE W OR WO CONTRAST  Result Date: 05/27/2019 CLINICAL DATA:  79 year old male with small cell lung cancer on radiation treatment and chemotherapy. Fever and shortness of breath. EXAM: CT ANGIOGRAPHY CHEST WITH CONTRAST  TECHNIQUE: Multidetector CT imaging of the chest was performed using the standard protocol during bolus administration of intravenous contrast. Multiplanar CT image reconstructions and MIPs were obtained to evaluate the vascular anatomy. CONTRAST:  75mL OMNIPAQUE IOHEXOL 350 MG/ML SOLN COMPARISON:  Chest radiograph dated 05/27/2019 and CT dated 02/27/2019 FINDINGS: Cardiovascular: There is no cardiomegaly or pericardial effusion. Coronary vascular calcification primarily involving the LAD. Left pectoral pacemaker device noted. There is advanced atherosclerotic calcification of the thoracic aorta. No aneurysmal dilatation or dissection. There is no CT evidence of pulmonary embolism. Mediastinum/Nodes: Significant interval decrease in the size of the enlarged lymph node seen on the prior CT anterior to the carina now measuring approximately 11 mm in short axis (previously 21 mm). Decrease in the size of subcarinal lymph node now measuring approximately 9 mm in short axis. No new adenopathy. There is a small hiatal hernia. There is mild circumferential thickening of the esophagus with infiltration of the surrounding fat which may represent esophagitis. Clinical correlation is recommended. No mediastinal fluid collection. Lungs/Pleura: There is a background of emphysema. The previously seen right upper lobe subpleural lesion is not identified on today's exam. A 2 mm subpleural nodule (63/6) may correspond to the previously seen 6 mm nodule. A 7 mm nodule at the left lung base (71/6) has minimally decreased in size (previously measuring  approximately 8 mm). There is trace right pleural effusion, new since the prior CT. No consolidative changes or pneumothorax. The central airways are patent. Upper Abdomen: There is scattered colonic diverticula. Apparent area of colonic thickening and mild pericolonic stranding in the right upper abdomen (series 4 image 100 and coronal series 7, image 22) is suboptimally evaluated due to  partial visualization but is concerning for diverticulitis. CT of the abdomen pelvis may provide better evaluation. Musculoskeletal: Osteopenia with mild degenerative changes. No acute osseous pathology. Review of the MIP images confirms the above findings. IMPRESSION: 1. No acute intrathoracic pathology. No CT evidence of pulmonary embolism. 2. Significant interval decrease in the size of the previously seen mediastinal lymph nodes. No new adenopathy. 3. Near complete resolution of the previously seen right upper lobe hypermetabolic subpleural nodule. 4. Thickened appearance of the esophagus concerning for esophagitis. Clinical correlation is recommended. 5. Partially visualized area of colonic thickening and pericolonic stranding in the right upper abdomen, concerning for diverticulitis. CT of the abdomen pelvis may provide better evaluation. 6. Aortic Atherosclerosis (ICD10-I70.0) and Emphysema (ICD10-J43.9). Electronically Signed   By: Anner Crete M.D.   On: 05/27/2019 15:37   CT ABDOMEN PELVIS W CONTRAST  Result Date: 05/28/2019 CLINICAL DATA:  Fever of unknown origin.  Small cell lung carcinoma. EXAM: CT ABDOMEN AND PELVIS WITH CONTRAST TECHNIQUE: Multidetector CT imaging of the abdomen and pelvis was performed using the standard protocol following bolus administration of intravenous contrast. CONTRAST:  100 mL OMNIPAQUE IOHEXOL 300 MG/ML  SOLN COMPARISON:  CT chest 05/27/2019.  PET CT scan 03/20/2019. FINDINGS: Lower chest: Lung bases again demonstrate severe emphysematous disease and basilar fibrosis. Small right pleural effusion noted. No left pleural effusion or pericardial effusion. Hepatobiliary: No focal liver abnormality is seen. No gallstones, gallbladder wall thickening, or biliary dilatation. Pancreas: Unremarkable. No pancreatic ductal dilatation or surrounding inflammatory changes. Spleen: Normal in size without focal abnormality. Adrenals/Urinary Tract: Adrenal glands are unremarkable.  Small bilateral renal cysts are unchanged. The kidneys otherwise appear normal. Ureters and urinary bladder are unremarkable. Bladder is unremarkable. Stomach/Bowel: There are areas of marked wall thickening of the colon with surrounding stranding, most notable in the proximal to mid transverse and mid to distal descending consistent with colitis. The appendix has been removed. The patient has a small hiatal hernia. The stomach is otherwise unremarkable. Small bowel appears normal. Vascular/Lymphatic: Atherosclerosis is noted. The descending abdominal aorta measures up to 2.7 cm in diameter, unchanged. Reproductive: Prostate is unremarkable. Other: None. Musculoskeletal: No acute or focal abnormality. IMPRESSION: The examination is positive for segmental infectious or inflammatory colitis most notable in the proximal to mid transverse and distal descending. No other acute abnormality. Severe emphysema. Small right pleural effusion. Atherosclerosis. Small hiatal hernia. Electronically Signed   By: Inge Rise M.D.   On: 05/28/2019 14:04   US Venous Img Lower Bilateral  Result Date: 06/04/2019 CLINICAL DATA:  Bilateral leg edema. EXAM: BILATERAL LOWER EXTREMITY VENOUS DOPPLER ULTRASOUND TECHNIQUE: Gray-scale sonography with graded compression, as well as color Doppler and duplex ultrasound were performed to evaluate the lower extremity deep venous systems from the level of the common femoral vein and including the common femoral, femoral, profunda femoral, popliteal and calf veins including the posterior tibial, peroneal and gastrocnemius veins when visible. The superficial great saphenous vein was also interrogated. Spectral Doppler was utilized to evaluate flow at rest and with distal augmentation maneuvers in the common femoral, femoral and popliteal veins. COMPARISON:  None. FINDINGS: RIGHT LOWER EXTREMITY Common  Femoral Vein: No evidence of thrombus. Normal compressibility, respiratory phasicity and  response to augmentation. Saphenofemoral Junction: No evidence of thrombus. Normal compressibility and flow on color Doppler imaging. Profunda Femoral Vein: No evidence of thrombus. Normal compressibility and flow on color Doppler imaging. Femoral Vein: No evidence of thrombus. Normal compressibility, respiratory phasicity and response to augmentation. Popliteal Vein: No evidence of thrombus. Normal compressibility, respiratory phasicity and response to augmentation. Calf Veins: No evidence of thrombus. Normal compressibility and flow on color Doppler imaging. LEFT LOWER EXTREMITY Common Femoral Vein: No evidence of thrombus. Normal compressibility, respiratory phasicity and response to augmentation. Saphenofemoral Junction: No evidence of thrombus. Normal compressibility and flow on color Doppler imaging. Profunda Femoral Vein: No evidence of thrombus. Normal compressibility and flow on color Doppler imaging. Femoral Vein: No evidence of thrombus. Normal compressibility, respiratory phasicity and response to augmentation. Popliteal Vein: No evidence of thrombus. Normal compressibility, respiratory phasicity and response to augmentation. Calf Veins: No evidence of thrombus. Normal compressibility and flow on color Doppler imaging. Other Findings:  None. IMPRESSION: No evidence of deep venous thrombosis in either lower extremity. Electronically Signed   By: Markus Daft M.D.   On: 06/04/2019 15:38      ASSESSMENT & PLAN:  1. 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 Chemotherapy-induced neutropenia (HCC)   3. Thrombocytopenia ([place])   4. Thrush    #Right lung high-grade poorly differentiated carcinoma-suspect small cell lung cancer.  Clinically T1c N2 M0.  Finished concurrent chemoradiation-carboplatin and etoposide Will repeat CT chest abdomen pelvis w contrast in 1 month to evaluate treatment response.  If complete or partial response, will recommend prophylactic cranial RT  Labs reviewed and  discussed with patient. Chemotherapy-induced neutropenia, counts are recovering.  ANC 0.7 today. Patient has been on Cipro 500 mg twice daily.  He is finishing the course today.  Thrush, continue nystatin swish and swallow.  Continue to use Peridex for another 2 weeks. Thrombocytopenia, hemoglobin has improved to 102000.  Anticipate platelet counts to continue to recover. Patient has been off her Eliquis since the start of chemoradiation. Advised patient to wait for another week before restarting on Eliquis.  Rheumatoid arthritis, Anticipate patient's counts will continue to improve in the next 1 to 2 weeks.  Advised patient to continue to hold off methotrexate for at least another 2 weeks before restarting.   All questions were answered. The patient knows to call the clinic with any problems questions or concerns. Follow up appointment: 5-6 weeks    Earlie Server, MD, PhD Hematology Oncology [facility] [place] [place] Pager- [phone] 06/25/2019

## 2019-06-26 ENCOUNTER — Ambulatory Visit: Payer: Medicare PPO

## 2019-06-27 ENCOUNTER — Ambulatory Visit: Payer: Medicare PPO

## 2019-07-01 ENCOUNTER — Telehealth: Payer: Self-pay

## 2019-07-01 NOTE — Telephone Encounter (Signed)
Done...   CT scan has been scheduled as requested per Dr.Yu along with pt to RTC appts for lab/MD 2 days after CT Pt is aware of his scheduled appts location, dates and times.

## 2019-07-01 NOTE — Telephone Encounter (Signed)
-----   Message from Earlie Server, MD sent at 06/30/2019 11:20 PM EDT ----- Please schedule patient to have CT chest abd pelvis in one month, and he follows up with me lab md after CT. Thanks.

## 2019-07-14 ENCOUNTER — Telehealth: Payer: Self-pay | Admitting: Hospice and Palliative Medicine

## 2019-07-14 NOTE — Telephone Encounter (Signed)
I received a call from patient.  He says that he is trying to catch up on physician appointments from where he had to cancel due to his wife's illness.  He wanted to cancel his appointment with me this Friday.  He was not interested in rescheduling right now but just wanted to call as needed.

## 2019-07-18 ENCOUNTER — Inpatient Hospital Stay: Payer: Medicare PPO | Admitting: Hospice and Palliative Medicine

## 2019-07-18 ENCOUNTER — Ambulatory Visit (INDEPENDENT_AMBULATORY_CARE_PROVIDER_SITE_OTHER): Payer: Medicare PPO | Admitting: *Deleted

## 2019-07-18 DIAGNOSIS — I495 Sick sinus syndrome: Secondary | ICD-10-CM | POA: Diagnosis not present

## 2019-07-18 LAB — CUP PACEART REMOTE DEVICE CHECK
Battery Remaining Longevity: 170 mo
Battery Voltage: 3.2 V
Brady Statistic AP VP Percent: 0.03 %
Brady Statistic AP VS Percent: 32.14 %
Brady Statistic AS VP Percent: 0.03 %
Brady Statistic AS VS Percent: 67.8 %
Brady Statistic RA Percent Paced: 31.81 %
Brady Statistic RV Percent Paced: 0.06 %
Date Time Interrogation Session: 20210527215214
Implantable Lead Implant Date: 19941110
Implantable Lead Implant Date: 19941110
Implantable Lead Location: 753859
Implantable Lead Location: 753860
Implantable Lead Model: 5034
Implantable Lead Model: 5534
Implantable Pulse Generator Implant Date: 20201012
Lead Channel Impedance Value: 665 Ohm
Lead Channel Impedance Value: 722 Ohm
Lead Channel Impedance Value: 779 Ohm
Lead Channel Impedance Value: 836 Ohm
Lead Channel Pacing Threshold Amplitude: 0.375 V
Lead Channel Pacing Threshold Amplitude: 0.75 V
Lead Channel Pacing Threshold Pulse Width: 0.4 ms
Lead Channel Pacing Threshold Pulse Width: 0.4 ms
Lead Channel Sensing Intrinsic Amplitude: 0.5 mV
Lead Channel Sensing Intrinsic Amplitude: 0.5 mV
Lead Channel Sensing Intrinsic Amplitude: 9.5 mV
Lead Channel Sensing Intrinsic Amplitude: 9.5 mV
Lead Channel Setting Pacing Amplitude: 1.5 V
Lead Channel Setting Pacing Amplitude: 2.5 V
Lead Channel Setting Pacing Pulse Width: 0.4 ms
Lead Channel Setting Sensing Sensitivity: 4 mV

## 2019-07-18 NOTE — Progress Notes (Signed)
Remote pacemaker transmission.   

## 2019-07-23 ENCOUNTER — Ambulatory Visit
Admission: RE | Admit: 2019-07-23 | Discharge: 2019-07-23 | Disposition: A | Discharge status: Home / Self Care | Payer: Medicare PPO | Source: Ambulatory Visit | Attending: Oncology | Admitting: Oncology

## 2019-07-23 ENCOUNTER — Telehealth: Payer: Self-pay | Admitting: *Deleted

## 2019-07-23 ENCOUNTER — Other Ambulatory Visit: Payer: Self-pay

## 2019-07-23 DIAGNOSIS — C3490 Malignant neoplasm of unspecified part of unspecified bronchus or lung: Secondary | ICD-10-CM

## 2019-07-23 MED ORDER — IOHEXOL 300 MG/ML  SOLN
100.0000 mL | Freq: Once | INTRAMUSCULAR | Status: AC | PRN
  Administered 2019-07-23: 100 mL via INTRAVENOUS

## 2019-07-23 NOTE — Telephone Encounter (Signed)
Results noted. Good response to treatment.  Please confirm with pt if he has resumed his eliquis 5mg  bid Eliquis was discontinued during his treatment and I have asked him to resume during last visit.  As long as he is on anticoagulation, no other intervention needed.

## 2019-07-23 NOTE — Telephone Encounter (Signed)
Patient reports that he has started taking his Eliquis as directed

## 2019-07-23 NOTE — Telephone Encounter (Signed)
Called report IMPRESSION: 1. Significant reduction in size of the previously hypermetabolic right lower paratracheal lymph node compared to prior PET-CT, currently measuring 1.0 cm in short axis, previously 2.4 cm in short axis. This lymph node was indistinct on the CT angiography of the chest from 05/27/2019. No findings of new or progressive malignancy. No recurrence of the right upper lobe subpleural hypermetabolic nodule shown on prior PET-CT. 2. 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 Even in retrospect this was not appreciable on 05/27/2019. No characteristic findings of acute pulmonary embolus. 3. New somewhat irregular density centrally in the left lower lobe has some associated architectural distortion/volume loss. This makes this more likely to represent atelectasis, although surveillance is suggested. 4. 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 Recommend annual imaging followup by CTA or MRA. This recommendation follows 2010 ACCF/AHA/AATS/ACR/ASA/SCA/SCAI/SIR/STS/SVM Guidelines for the Diagnosis and Management of Patients with Thoracic Aortic Disease. Circulation.2010; 121: T625-W389. Aortic aneurysm NOS (ICD10-I71.9) 5. Other imaging findings of potential clinical significance: Coronary atherosclerosis with mild cardiomegaly. Stable ground-glass densities diffusely at the lung bases, favoring fibrosis. Stable cylindrical bronchiectasis. Complex exophytic lesion from the left kidney lower pole posteriorly, probably a complex cyst although difficult to fully characterize given the morphology. Descending colon diverticulosis. Mild lumbar spondylosis and degenerative disc disease. 6. Emphysema and aortic atherosclerosis.  Aortic Atherosclerosis (ICD10-I70.0) and Emphysema (ICD10-J43.9).   Electronically Signed   By: Van Clines M.D.   On: 07/23/2019 14:18

## 2019-07-25 ENCOUNTER — Encounter: Payer: Self-pay | Admitting: Oncology

## 2019-07-25 ENCOUNTER — Other Ambulatory Visit: Payer: Self-pay

## 2019-07-25 ENCOUNTER — Inpatient Hospital Stay: Payer: Medicare PPO | Attending: Oncology

## 2019-07-25 ENCOUNTER — Inpatient Hospital Stay: Payer: Medicare PPO | Admitting: Oncology

## 2019-07-25 VITALS — BP 118/73 | HR 69 | Temp 98.1°F | Resp 18 | Wt 165.4 lb

## 2019-07-25 DIAGNOSIS — E785 Hyperlipidemia, unspecified: Secondary | ICD-10-CM | POA: Insufficient documentation

## 2019-07-25 DIAGNOSIS — I2782 Chronic pulmonary embolism: Secondary | ICD-10-CM | POA: Insufficient documentation

## 2019-07-25 DIAGNOSIS — R040 Epistaxis: Secondary | ICD-10-CM | POA: Diagnosis not present

## 2019-07-25 DIAGNOSIS — K5730 Diverticulosis of large intestine without perforation or abscess without bleeding: Secondary | ICD-10-CM | POA: Diagnosis not present

## 2019-07-25 DIAGNOSIS — J449 Chronic obstructive pulmonary disease, unspecified: Secondary | ICD-10-CM | POA: Diagnosis not present

## 2019-07-25 DIAGNOSIS — I700 Atherosclerosis of aorta: Secondary | ICD-10-CM | POA: Diagnosis not present

## 2019-07-25 DIAGNOSIS — Z7901 Long term (current) use of anticoagulants: Secondary | ICD-10-CM | POA: Insufficient documentation

## 2019-07-25 DIAGNOSIS — Z923 Personal history of irradiation: Secondary | ICD-10-CM | POA: Insufficient documentation

## 2019-07-25 DIAGNOSIS — M47816 Spondylosis without myelopathy or radiculopathy, lumbar region: Secondary | ICD-10-CM | POA: Insufficient documentation

## 2019-07-25 DIAGNOSIS — C3431 Malignant neoplasm of lower lobe, right bronchus or lung: Secondary | ICD-10-CM | POA: Insufficient documentation

## 2019-07-25 DIAGNOSIS — K219 Gastro-esophageal reflux disease without esophagitis: Secondary | ICD-10-CM | POA: Diagnosis not present

## 2019-07-25 DIAGNOSIS — Z880 Allergy status to penicillin: Secondary | ICD-10-CM | POA: Insufficient documentation

## 2019-07-25 DIAGNOSIS — Z818 Family history of other mental and behavioral disorders: Secondary | ICD-10-CM | POA: Insufficient documentation

## 2019-07-25 DIAGNOSIS — Z79899 Other long term (current) drug therapy: Secondary | ICD-10-CM | POA: Diagnosis not present

## 2019-07-25 DIAGNOSIS — C3490 Malignant neoplasm of unspecified part of unspecified bronchus or lung: Secondary | ICD-10-CM

## 2019-07-25 DIAGNOSIS — I480 Paroxysmal atrial fibrillation: Secondary | ICD-10-CM | POA: Insufficient documentation

## 2019-07-25 DIAGNOSIS — M069 Rheumatoid arthritis, unspecified: Secondary | ICD-10-CM

## 2019-07-25 DIAGNOSIS — Z8249 Family history of ischemic heart disease and other diseases of the circulatory system: Secondary | ICD-10-CM | POA: Diagnosis not present

## 2019-07-25 DIAGNOSIS — I719 Aortic aneurysm of unspecified site, without rupture: Secondary | ICD-10-CM | POA: Diagnosis not present

## 2019-07-25 DIAGNOSIS — Z882 Allergy status to sulfonamides status: Secondary | ICD-10-CM | POA: Diagnosis not present

## 2019-07-25 DIAGNOSIS — J479 Bronchiectasis, uncomplicated: Secondary | ICD-10-CM | POA: Diagnosis not present

## 2019-07-25 DIAGNOSIS — Z87891 Personal history of nicotine dependence: Secondary | ICD-10-CM | POA: Diagnosis not present

## 2019-07-25 DIAGNOSIS — Z801 Family history of malignant neoplasm of trachea, bronchus and lung: Secondary | ICD-10-CM | POA: Insufficient documentation

## 2019-07-25 DIAGNOSIS — D539 Nutritional anemia, unspecified: Secondary | ICD-10-CM | POA: Diagnosis not present

## 2019-07-25 LAB — COMPREHENSIVE METABOLIC PANEL
ALT: 19 U/L (ref 0–44)
AST: 39 U/L (ref 15–41)
Albumin: 3.9 g/dL (ref 3.5–5.0)
Alkaline Phosphatase: 110 U/L (ref 38–126)
Anion gap: 8 (ref 5–15)
BUN: 18 mg/dL (ref 8–23)
CO2: 25 mmol/L (ref 22–32)
Calcium: 9.1 mg/dL (ref 8.9–10.3)
Chloride: 103 mmol/L (ref 98–111)
Creatinine, Ser: 1.2 mg/dL (ref 0.61–1.24)
GFR calc Af Amer: >60 mL/min (ref >60)
GFR calc non Af Amer: 58 mL/min — ABNORMAL LOW (ref >60)
Glucose, Bld: 95 mg/dL (ref 70–99)
Potassium: 4 mmol/L (ref 3.5–5.1)
Sodium: 136 mmol/L (ref 135–145)
Total Bilirubin: 0.8 mg/dL (ref 0.3–1.2)
Total Protein: 7.1 g/dL (ref 6.5–8.1)

## 2019-07-25 LAB — CBC WITH DIFFERENTIAL/PLATELET
Abs Immature Granulocytes: 0.03 10*3/uL (ref 0.00–0.07)
Basophils Absolute: 0 10*3/uL (ref 0.0–0.1)
Basophils Relative: 1 %
Eosinophils Absolute: 0.2 10*3/uL (ref 0.0–0.5)
Eosinophils Relative: 4 %
HCT: 37.2 % — ABNORMAL LOW (ref 39.0–52.0)
Hemoglobin: 12.5 g/dL — ABNORMAL LOW (ref 13.0–17.0)
Immature Granulocytes: 1 %
Lymphocytes Relative: 13 %
Lymphs Abs: 0.5 10*3/uL — ABNORMAL LOW (ref 0.7–4.0)
MCH: 37.1 pg — ABNORMAL HIGH (ref 26.0–34.0)
MCHC: 33.6 g/dL (ref 30.0–36.0)
MCV: 110.4 fL — ABNORMAL HIGH (ref 80.0–100.0)
Monocytes Absolute: 0.3 10*3/uL (ref 0.1–1.0)
Monocytes Relative: 7 %
Neutro Abs: 3.1 10*3/uL (ref 1.7–7.7)
Neutrophils Relative %: 74 %
Platelets: 106 10*3/uL — ABNORMAL LOW (ref 150–400)
RBC: 3.37 MIL/uL — ABNORMAL LOW (ref 4.22–5.81)
RDW: 15.2 % (ref 11.5–15.5)
WBC: 4.2 10*3/uL (ref 4.0–10.5)
nRBC: 0 % (ref 0.0–0.2)

## 2019-07-26 NOTE — Progress Notes (Signed)
Hematology/Oncology follow up note [facility] Telephone:([phone]) [phone] Fax:(336) [phone]   Patient Care Team: Pleas Koch, NP as PCP - General (Internal Medicine) Deboraha Sprang, MD as Consulting Physician (Cardiology) Marlowe Sax, MD as Referring Physician (Internal Medicine) Birder Robson, MD as Referring Physician (Ophthalmology) Oneta Rack, MD as Referring Physician (Dermatology) Salli Real, DDS as Referring Physician (Dentistry) Telford Nab, RN as Oncology Nurse Navigator Earlie Server, MD as Consulting Physician (Hematology and Oncology)  REFERRING PROVIDER: Pleas Koch, NP  CHIEF COMPLAINTS/REASON FOR VISIT:  Follow-up for management of lung cancer  HISTORY OF PRESENTING ILLNESS:   Darryl White is a  79 y.o.  male with PMH listed below was seen in consultation at the request of  Pleas Koch, NP  for evaluation of lung mass April 2020 patient was evaluated by radiation oncology Dr. Baruch Gouty for right lung nodules concerning for progressive malignancy.   Patient had progressively enlarging media right lower lobe which was previously treated in April 2020 empirically with SBRT.  Biopsy was not done due to poor lung function secondary to COPD/moderate fibrosis. Patient also had a right upper lobe nodule being closely watched, and was found to have progressively enlarged on 10/29/2018 CT chest. 02/27/2019 patient had another CT done which showed marked progression of precarinal lymph node and a continued progression of peripheral right upper lobe pulmonary nodule. 03/20/2019 PET scan showed hypermetabolic right lower paratracheal and subcarinal adenopathy with hypermetabolic right upper lobe subpleural nodule, or significantly worsened compared to prior CT scan. Patient also had 1.2 x 1 cm left upper lobe confluent groundglass densities with low metabolic activity. Patient was referred to establish care with oncology  for further evaluation and management.  Patient reports chronic shortness of breath with exertion.  He follows up with pulmonology and was recently seen by Dr. Patsey Berthold.  Denies any hemoptysis,.  He has a history of extensive smoking history, 67-pack-year, quit in 2019. He lives with wife at home. -CT head is negative. MRI is contraindicated due to pace marker.   # PET scan images were reviewed and discussed with patient. Also discussed with patient about pathology report Pre-carinal lymph node EBUS guided FNA showed malignancy, metastatic high-grade carcinoma. 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  There are patchy areas with focal weak staining for CD56-which is a neuroendocrine marker.  However many clusters of tumor cells are negative.  While the morphology alone would support a diagnosis of small cell carcinoma, there is insufficient immunohistochemistry evidence to support any specific tumor differentiation. I discussed the possibility of small cell lung cancer which is more aggressive in nature and is related with worse outcome. Regimen with concurrent radiation and chemotherapy with carboplatin and taxol would not be sufficient for small cell lung cancer treatment.We will treat the benefit of doubt using small cell lung cancer regimen, he agrees with the plan. I confirmed with Dr. 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  # Patient developed multiple epistaxis episodes during the interval.  Some episodes takes about 20 to 30 minutes to stop.  Patient called RN navigator and I advised patient to stop Eliquis.  Patient has been on Eliquis 5 mg twice daily due to cardiology diseases.  Patient has contacted cardiology and [name] recommend  patient to hold off  Eliquis for now  # dmitted from 05/27/2019-05/30/2019 due to neutropenic fever. He has an [place] of 0.1. Urine and blood culture remained negative. CT abdomen concerning for colitis. Patient was treated with empiric broad-spectrum antibiotics and his symptoms improved. His ANC improved to 0.6 and patient was discharged on Keflex and Flagyl for 3 more days to complete treatment of colitis. # 06/23/2019 finished radiation.   INTERVAL HISTORY Darryl White is a 79 y.o. male who has above history reviewed by me today presents for follow up acute visit for chemotherapy radiation for lung cancer-high-grade carcinoma,  Problems and complaints are listed below: He tolerates well. No new complaints.     Review of Systems  Constitutional: Positive for fatigue. Negative for appetite change, chills, fever and unexpected weight change.  HENT:   Negative for hearing loss, nosebleeds and voice change.   Eyes: Negative for eye problems and icterus.  Respiratory: Negative for chest tightness, cough and shortness of breath.   Cardiovascular: Negative for chest pain and leg swelling.  Gastrointestinal: Negative for abdominal distention and abdominal pain.  Endocrine: Negative for hot flashes.  Genitourinary: Negative for difficulty urinating, dysuria and frequency.   Musculoskeletal: Negative for arthralgias.  Skin: Negative for itching and rash.  Neurological: Negative for light-headedness and numbness.  Hematological: Negative for adenopathy. Does not bruise/bleed easily.  Psychiatric/Behavioral: Negative for confusion.    MEDICAL HISTORY:  Past Medical History:  Diagnosis Date  . Arthritis 2019   dx of osteoarthritis and rhemautoid arthritis  . Cancer ([place])    lung  . Cataract 2019   left eye  . Chicken pox   . Collagen vascular disease ([place])   . 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 COPD (chronic obstructive pulmonary disease) ([place])   . Diverticulitis   .  GERD (gastroesophageal reflux disease)   . 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 History of hiatal hernia   . History of stomach ulcers   . Hyperlipidemia   . Lymphocytic colitis   . Orthostatic hypotension   . Osteoarthritis   . Pancreatitis   . Paroxysmal atrial fibrillation (HCC)   . Presence of permanent cardiac pacemaker   . 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 APPENDECTOMY    . BACK SURGERY     bone chip lasered  . CARDIAC CATHETERIZATION    . CATARACT EXTRACTION W/ INTRAOCULAR LENS IMPLANT Right   . COLON SURGERY  1990's   12" removed of large intestine  . EYE SURGERY    . INSERT / REPLACE / [place]  . large intestine block    . PACEMAKER INSERTION     MDT dual chamber pacemaker  . PORTA CATH INSERTION N/A 04/17/2019   Procedure: PORTA CATH INSERTION;  Surgeon: Algernon Huxley, MD;  Location: [facility];  Service: Cardiovascular;  Laterality: N/A;  . PPM GENERATOR CHANGEOUT N/A 12/02/2018   Procedure: PPM GENERATOR CHANGEOUT;  Surgeon: Deboraha Sprang, MD;  Location: [facility];  Service: Cardiovascular;  Laterality: N/A;  . TONSILLECTOMY AND ADENOIDECTOMY    . VIDEO BRONCHOSCOPY WITH ENDOBRONCHIAL ULTRASOUND N/A 04/07/2019   Procedure: VIDEO BRONCHOSCOPY WITH ENDOBRONCHIAL ULTRASOUND;  Surgeon: Tyler Pita, MD;  Location: [facility];  Service: Pulmonary;  Laterality: N/A;    SOCIAL HISTORY: Social History   Socioeconomic History  . Marital status: Married  Spouse name: Not on file  . Number of children: 2  . Years of education: Not on file  . Highest education level: Not on file  Occupational History  . Occupation: Retired  Tobacco Use  . Smoking status: Former Smoker    Packs/day: 1.00    Years: 60.00    Pack years: 60.00    Types: Cigarettes    Quit date: 04/29/2017    Years since quitting: 2.2  . Smokeless tobacco: Never Used  Substance and  Sexual Activity  . Alcohol use: Yes    Alcohol/week: 5.0 standard drinks    Types: 5 Glasses of wine per week  . Drug use: No  . Sexual activity: Never  Other Topics Concern  . Not on file  Social History Narrative   Married.   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      Does not have a living will.   Desires CPR, does not want prolonged life support if futile.   Social Determinants of Health   Financial Resource Strain: Low Risk   . Difficulty of Paying Living Expenses: Not hard at all  Food Insecurity: No Food Insecurity  . Worried About Charity fundraiser in the Last Year: Never true  . Ran Out of Food in the Last Year: Never true  Transportation Needs: No Transportation Needs  . Lack of Transportation (Medical): No  . Lack of Transportation (Non-Medical): No  Physical Activity: Sufficiently Active  . Days of Exercise per Week: 7 days  . Minutes of Exercise per Session: 60 min  Stress: No Stress Concern Present  . Feeling of Stress : Not at all  Social Connections:   . Frequency of Communication with Friends and Family:   . Frequency of Social Gatherings with Friends and Family:   . Attends Religious Services:   . Active Member of Clubs or Organizations:   . Attends Archivist Meetings:   Marland Kitchen Marital Status:   Intimate Partner Violence: Not At Risk  . Fear of Current or Ex-Partner: No  . Emotionally Abused: No  . Physically Abused: No  . Sexually Abused: No    FAMILY HISTORY: Family History  Problem Relation Age of Onset  . Lung cancer Mother   . Heart disease Father   . Alzheimer's disease Father     ALLERGIES:  is allergic to sulfa antibiotics and penicillins.  MEDICATIONS:  Current Outpatient Medications  Medication Sig Dispense Refill  . albuterol (PROAIR HFA) 108 (90 Base) MCG/ACT inhaler Inhale 1-2 puffs into the lungs every 6 (six) hours as needed for wheezing or shortness of breath. 18 g 5  . antiseptic oral rinse (BIOTENE)  LIQD 15 mLs by Mouth Rinse route as needed for dry mouth.    . brimonidine (ALPHAGAN) 0.15 % ophthalmic solution Place 1 drop into both eyes 2 (two) times daily.     . Budeson-Glycopyrrol-Formoterol (BREZTRI AEROSPHERE) 160-9-4.8 MCG/ACT AERO Inhale 2 puffs into the lungs 2 (two) times daily. 10.7 g 6  . chlorhexidine (PERIDEX) 0.12 % solution Use as directed 15 mLs in the mouth or throat in the morning and at bedtime. 473 mL 0  . Cholecalciferol (VITAMIN D3) 50 MCG (2000 UT) capsule Take 1 capsule (2,000 Units total) by mouth daily with lunch. 30 capsule 1  . famotidine (PEPCID) 20 MG tablet Take 20 mg by mouth 2 (two) times daily.    . fenofibrate (TRICOR) 145 MG tablet Take 1 tablet (145 mg total) by mouth daily. 90 tablet  1  . folic acid (FOLVITE) 1 MG tablet Take 1 mg by mouth daily.    . furosemide (LASIX) 20 MG tablet Take 1 tablet (20 mg total) by mouth daily as needed for fluid. 30 tablet 0  . gabapentin (NEURONTIN) 100 MG capsule Take 100 mg by mouth 3 (three) times daily.    Marland Kitchen lidocaine-prilocaine (EMLA) cream Apply to affected area once 30 g 3  . methotrexate 2.5 MG tablet Take 12.5 mg by mouth every Sunday.     . nystatin (MYCOSTATIN) 100000 UNIT/ML suspension Take 5 mLs (500,000 Units total) by mouth 4 (four) times daily. Swish and swallow 473 mL 0  . sodium chloride (OCEAN) 0.65 % SOLN nasal spray Place 1 spray into both nostrils at bedtime as needed for congestion.     . traMADol (ULTRAM) 50 MG tablet Take 25 mg by mouth at bedtime as needed for moderate pain.     . chlorproMAZINE (THORAZINE) 25 MG tablet Take 1 tablet (25 mg total) by mouth 3 (three) times daily. (Patient not taking: Reported on 06/04/2019) 30 tablet 0  . dronabinol (MARINOL) 5 MG capsule Take 1 capsule (5 mg total) by mouth 2 (two) times daily before a meal. (Patient not taking: Reported on 06/18/2019) 60 capsule 0  . ondansetron (ZOFRAN) 8 MG tablet Take 1 tablet (8 mg total) by mouth 2 (two) times daily as needed  for refractory nausea / vomiting. Start on day 3 after carboplatin chemo. (Patient not taking: Reported on 06/04/2019) 30 tablet 1  . prochlorperazine (COMPAZINE) 10 MG tablet Take 1 tablet (10 mg total) by mouth every 6 (six) hours as needed (Nausea or vomiting). (Patient not taking: Reported on 06/18/2019) 30 tablet 1   No current facility-administered medications for this visit.     PHYSICAL EXAMINATION: ECOG PERFORMANCE STATUS: 1 - Symptomatic but completely ambulatory Vitals:   07/25/19 1402  BP: 118/73  Pulse: 69  Resp: 18  Temp: 98.1 F (36.7 C)  SpO2: 98%   Filed Weights   07/25/19 1402  Weight: 165 lb 6.4 oz (75 kg)    Physical Exam Vitals reviewed.  Constitutional:      General: He is not in acute distress.    Comments: Patient walks independently  HENT:     Head: Normocephalic and atraumatic.     Mouth/Throat:     Comments: Ritta Slot has resolved.  Eyes:     General: No scleral icterus. Cardiovascular:     Rate and Rhythm: Normal rate and regular rhythm.     Heart sounds: Normal heart sounds.  Pulmonary:     Effort: Pulmonary effort is normal. No respiratory distress.     Breath sounds: No wheezing or rhonchi.     Comments: Patient has bibasilar fine crackles chronically Abdominal:     General: Bowel sounds are normal. There is no distension.     Palpations: Abdomen is soft.  Musculoskeletal:        General: No deformity. Normal range of motion.     Cervical back: Normal range of motion and neck supple.  Skin:    General: Skin is warm and dry.     Findings: No erythema or rash.  Neurological:     Mental Status: He is alert and oriented to person, place, and time. Mental status is at baseline.     Cranial Nerves: No cranial nerve deficit.     Coordination: Coordination normal.  Psychiatric:        Mood and Affect: Mood normal.  LABORATORY DATA:  I have reviewed the data as listed Lab Results  Component Value Date   WBC 4.2 07/25/2019   HGB 12.5  (L) 07/25/2019   HCT 37.2 (L) 07/25/2019   MCV 110.4 (H) 07/25/2019   PLT 106 (L) 07/25/2019   Recent Labs    06/18/19 1002 06/25/19 1315 07/25/19 1322  NA 134* 133* 136  K 4.8 4.0 4.0  CL 100 103 103  CO2 25 23 25   GLUCOSE 104* 135* 95  BUN 16 16 18   CREATININE 1.12 1.16 1.20  CALCIUM 8.9 8.6* 9.1  GFRNONAA >60 60* 58*  GFRAA >60 >60 >60  PROT 6.6 5.9* 7.1  ALBUMIN 3.5 3.3* 3.9  AST 31 30 39  ALT 19 16 19   ALKPHOS 104 86 110  BILITOT 0.8 0.7 0.8   Iron/TIBC/Ferritin/ %Sat No results found for: IRON, TIBC, FERRITIN, IRONPCTSAT    RADIOGRAPHIC STUDIES: I have personally reviewed the radiological images as listed and agreed with the findings in the report.  CT Chest W Contrast  Result Date: 07/23/2019 CLINICAL DATA:  Restaging of non-small cell lung cancer of the right lung, recent completion of chemotherapy and radiation therapy, assessment for response. EXAM: CT CHEST, ABDOMEN, AND PELVIS WITH CONTRAST TECHNIQUE: Multidetector CT imaging of the chest, abdomen and pelvis was performed following the standard protocol during bolus administration of intravenous contrast. CONTRAST:  121mL OMNIPAQUE IOHEXOL 300 MG/ML  SOLN COMPARISON:  CT scans of 05/27/2019 05/28/2019; PET-CT of 03/20/2019 FINDINGS: CT CHEST FINDINGS Cardiovascular: Left pacer device with right atrial and right ventricular leads. 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 Coronary, aortic arch, and branch vessel atherosclerotic vascular disease. Mild cardiomegaly. There is peripheral filling defect in a subsegmental branch of the posterior basal segmental right lower lobe pulmonary artery as shown on images 30 through 34 of series 4, compatible with localized chronic pulmonary embolus. Please note that this was not appreciable on 05/27/2019. Right Port-A-Cath tip: SVC. Mediastinum/Nodes: The previously hypermetabolic right paratracheal lymph node anterior to the carina measures 1.0 cm in short  axis on image 30/2, previously indistinct and somewhat difficult to measure on 05/27/2019, and prior to that hypermetabolic and measuring 2.4 cm in short axis on 03/20/2019. No other prominent lymph nodes are identified. Lungs/Pleura: Considerable emphysema noted with peripheral interstitial accentuation favoring fibrosis, and some subpleural banding likely from scarring. Previously on 03/20/2019 there was a subpleural nodule in the right upper lobe which head essentially resolved by 05/27/2019 with only slight residual subpleural scar-like density, currently this area is similar to 05/27/2019 with a bandlike density measuring only about 3 mm in thickness on image 75/3. Stable 3 by 4 mm right middle lobe subpleural nodule anteriorly on image 107/3. Stable atelectasis/scarring posteromedially in the right lower lobe for example on image 140/3. Stable cylindrical bronchiectasis. Stable ground-glass densities diffusely at the lung bases. There is some confluent mild nodularity in the left upper lobe adjacent to the major fissure measuring 1.2 by 0.9 cm on image 93/3, essentially stable. New somewhat irregular density centrally in the left lower lobe has ill-defined margins and measures 1.4 by 1.4 cm on image 93/3, but also has some associated architectural distortion/volume loss. This makes this more likely to represent atelectasis, although surveillance is suggested. Musculoskeletal: Thoracic spondylosis. CT ABDOMEN PELVIS FINDINGS Hepatobiliary: Mildly accentuated mucosal enhancement along the gallbladder wall probably from nondistention subtle hypodensity medially in the right hepatic lobe, for example on image 54/2, nonspecific but unchanged without hypermetabolic activity on  prior PET-CT. Pancreas: Unremarkable Spleen: Unremarkable Adrenals/Urinary Tract: Complex exophytic 1.4 by 1.2 cm lesion from the left kidney lower pole posteriorly, previously with high noncontrast density L of at obvious accentuated  metabolic activity, favoring a complex cyst, and not significantly changing in density between portal venous and delayed phase images. Just below this there is a separate 1.1 by 0.9 cm hypodense lesion in the left kidney lower pole, most likely a cyst although difficult to fully characterize given the morphology. Both adrenal glands appear normal.  Urinary bladder unremarkable. Stomach/Bowel: Descending colon diverticulosis. Vascular/Lymphatic: Aortoiliac atherosclerotic vascular disease. Reproductive: Unremarkable Other: No supplemental non-categorized findings. Musculoskeletal: Mild lumbar spondylosis and degenerative disc disease. IMPRESSION: 1. Significant reduction in size of the previously hypermetabolic right lower paratracheal lymph node compared to prior PET-CT, currently measuring 1.0 cm in short axis, previously 2.4 cm in short axis. This lymph node was indistinct on the CT angiography of the chest from 05/27/2019. No findings of new or progressive malignancy. No recurrence of the right upper lobe subpleural hypermetabolic nodule shown on prior PET-CT. 2. 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 Even in retrospect this was not appreciable on 05/27/2019. No characteristic findings of acute pulmonary embolus. 3. New somewhat irregular density centrally in the left lower lobe has some associated architectural distortion/volume loss. This makes this more likely to represent atelectasis, although surveillance is suggested. 4. 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 Recommend annual imaging followup by CTA or MRA. This recommendation follows 2010 ACCF/AHA/AATS/ACR/ASA/SCA/SCAI/SIR/STS/SVM Guidelines for the Diagnosis and Management of Patients with Thoracic Aortic Disease. Circulation.2010; 121: P591-M384. Aortic aneurysm NOS (ICD10-I71.9) 5. Other imaging findings of potential clinical significance: Coronary atherosclerosis with mild cardiomegaly. Stable  ground-glass densities diffusely at the lung bases, favoring fibrosis. Stable cylindrical bronchiectasis. Complex exophytic lesion from the left kidney lower pole posteriorly, probably a complex cyst although difficult to fully characterize given the morphology. Descending colon diverticulosis. Mild lumbar spondylosis and degenerative disc disease. 6. Emphysema and aortic atherosclerosis. Aortic Atherosclerosis (ICD10-I70.0) and Emphysema (ICD10-J43.9). Electronically Signed   By: Van Clines M.D.   On: 07/23/2019 14:18   CT Abdomen Pelvis W Contrast  Result Date: 07/23/2019 CLINICAL DATA:  Restaging of non-small cell lung cancer of the right lung, recent completion of chemotherapy and radiation therapy, assessment for response. EXAM: CT CHEST, ABDOMEN, AND PELVIS WITH CONTRAST TECHNIQUE: Multidetector CT imaging of the chest, abdomen and pelvis was performed following the standard protocol during bolus administration of intravenous contrast. CONTRAST:  198mL OMNIPAQUE IOHEXOL 300 MG/ML  SOLN COMPARISON:  CT scans of 05/27/2019 05/28/2019; PET-CT of 03/20/2019 FINDINGS: CT CHEST FINDINGS Cardiovascular: Left pacer device with right atrial and right ventricular leads. 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 Coronary, aortic arch, and branch vessel atherosclerotic vascular disease. Mild cardiomegaly. There is peripheral filling defect in a subsegmental branch of the posterior basal segmental right lower lobe pulmonary artery as shown on images 30 through 34 of series 4, compatible with localized chronic pulmonary embolus. Please note that this was not appreciable on 05/27/2019. Right Port-A-Cath tip: SVC. Mediastinum/Nodes: The previously hypermetabolic right paratracheal lymph node anterior to the carina measures 1.0 cm in short axis on image 30/2, previously indistinct and somewhat difficult to measure on 05/27/2019, and prior to that hypermetabolic and measuring 2.4 cm in short  axis on 03/20/2019. No other prominent lymph nodes are identified. Lungs/Pleura: Considerable emphysema noted with peripheral interstitial accentuation favoring fibrosis, and some subpleural banding likely from  scarring. Previously on 03/20/2019 there was a subpleural nodule in the right upper lobe which head essentially resolved by 05/27/2019 with only slight residual subpleural scar-like density, currently this area is similar to 05/27/2019 with a bandlike density measuring only about 3 mm in thickness on image 75/3. Stable 3 by 4 mm right middle lobe subpleural nodule anteriorly on image 107/3. Stable atelectasis/scarring posteromedially in the right lower lobe for example on image 140/3. Stable cylindrical bronchiectasis. Stable ground-glass densities diffusely at the lung bases. There is some confluent mild nodularity in the left upper lobe adjacent to the major fissure measuring 1.2 by 0.9 cm on image 93/3, essentially stable. New somewhat irregular density centrally in the left lower lobe has ill-defined margins and measures 1.4 by 1.4 cm on image 93/3, but also has some associated architectural distortion/volume loss. This makes this more likely to represent atelectasis, although surveillance is suggested. Musculoskeletal: Thoracic spondylosis. CT ABDOMEN PELVIS FINDINGS Hepatobiliary: Mildly accentuated mucosal enhancement along the gallbladder wall probably from nondistention subtle hypodensity medially in the right hepatic lobe, for example on image 54/2, nonspecific but unchanged without hypermetabolic activity on prior PET-CT. Pancreas: Unremarkable Spleen: Unremarkable Adrenals/Urinary Tract: Complex exophytic 1.4 by 1.2 cm lesion from the left kidney lower pole posteriorly, previously with high noncontrast density L of at obvious accentuated metabolic activity, favoring a complex cyst, and not significantly changing in density between portal venous and delayed phase images. Just below this there is a  separate 1.1 by 0.9 cm hypodense lesion in the left kidney lower pole, most likely a cyst although difficult to fully characterize given the morphology. Both adrenal glands appear normal.  Urinary bladder unremarkable. Stomach/Bowel: Descending colon diverticulosis. Vascular/Lymphatic: Aortoiliac atherosclerotic vascular disease. Reproductive: Unremarkable Other: No supplemental non-categorized findings. Musculoskeletal: Mild lumbar spondylosis and degenerative disc disease. IMPRESSION: 1. Significant reduction in size of the previously hypermetabolic right lower paratracheal lymph node compared to prior PET-CT, currently measuring 1.0 cm in short axis, previously 2.4 cm in short axis. This lymph node was indistinct on the CT angiography of the chest from 05/27/2019. No findings of new or progressive malignancy. No recurrence of the right upper lobe subpleural hypermetabolic nodule shown on prior PET-CT. 2. 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 Even in retrospect this was not appreciable on 05/27/2019. No characteristic findings of acute pulmonary embolus. 3. New somewhat irregular density centrally in the left lower lobe has some associated architectural distortion/volume loss. This makes this more likely to represent atelectasis, although surveillance is suggested. 4. 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 Recommend annual imaging followup by CTA or MRA. This recommendation follows 2010 ACCF/AHA/AATS/ACR/ASA/SCA/SCAI/SIR/STS/SVM Guidelines for the Diagnosis and Management of Patients with Thoracic Aortic Disease. Circulation.2010; 121: V400-Q676. Aortic aneurysm NOS (ICD10-I71.9) 5. Other imaging findings of potential clinical significance: Coronary atherosclerosis with mild cardiomegaly. Stable ground-glass densities diffusely at the lung bases, favoring fibrosis. Stable cylindrical bronchiectasis. Complex exophytic lesion from the left kidney lower pole  posteriorly, probably a complex cyst although difficult to fully characterize given the morphology. Descending colon diverticulosis. Mild lumbar spondylosis and degenerative disc disease. 6. Emphysema and aortic atherosclerosis. Aortic Atherosclerosis (ICD10-I70.0) and Emphysema (ICD10-J43.9). Electronically Signed   By: Van Clines M.D.   On: 07/23/2019 14:18   CUP PACEART REMOTE DEVICE CHECK  Result Date: 07/18/2019 Scheduled remote reviewed. Normal device function.  7 VT events, EGM's show short PAT. Next remote 91 days. [place]     ASSESSMENT & PLAN:  1. 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 Rheumatoid arthritis, involving unspecified site, unspecified whether rheumatoid factor present ([place])   3. Other chronic pulmonary embolism without acute cor pulmonale ([place])   4. 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  Clinically T1c N2 M0.  Finished concurrent chemoradiation-carboplatin and etoposide CT images were independently reviewed by me and discussed with patient.  No recurrence or progression of disease.  Right lobe subsegmental chronic pulmonary embolus, he is on Eliquis 5mg  BID. He is asymptomatic.   New irregular density in the left lower lobe, possible atelectasis. Warrant attention on follow up .   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  Anemia, improved. MCV is high due to methotrexate.  Rheumatoid arthritis, Resumed on methotrexate.   All questions were answered. The patient knows to call the clinic with any problems questions or concerns. Follow up appointment: 3 months. Repeat CT    Earlie Server, MD, PhD Hematology Oncology [facility] at [facility] Pager- [phone] 07/26/2019

## 2019-07-28 ENCOUNTER — Ambulatory Visit
Admission: RE | Admit: 2019-07-28 | Discharge: 2019-07-28 | Disposition: A | Discharge status: Home / Self Care | Payer: Medicare PPO | Source: Ambulatory Visit | Attending: Radiation Oncology | Admitting: Radiation Oncology

## 2019-07-28 ENCOUNTER — Other Ambulatory Visit: Payer: Self-pay

## 2019-07-28 ENCOUNTER — Encounter: Payer: Self-pay | Admitting: Radiation Oncology

## 2019-07-28 VITALS — BP 112/71 | HR 81 | Temp 96.7°F | Resp 24 | Wt 165.1 lb

## 2019-07-28 DIAGNOSIS — C3490 Malignant neoplasm of unspecified part of unspecified bronchus or lung: Secondary | ICD-10-CM

## 2019-07-28 DIAGNOSIS — Z923 Personal history of irradiation: Secondary | ICD-10-CM | POA: Insufficient documentation

## 2019-07-28 DIAGNOSIS — C3431 Malignant neoplasm of lower lobe, right bronchus or lung: Secondary | ICD-10-CM | POA: Diagnosis present

## 2019-07-28 NOTE — Progress Notes (Signed)
Radiation Oncology Follow up Note  Name: Darryl White   Date:   07/28/2019 MRN:  [id number] DOB: [birth year]    This 79 y.o. 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  He had been treated previously treated for nonbiopsy stage I non-small cell cancer of the right lower lobe.  He developed progression in mediastinal nodes as well as a new nodule in the right upper lobe.  PET CT scan showed both of those areas to be hypermetabolic..  We treated him as a stage III disease and delivered 7000 cGy using IMRT treatment planning and delivery.  He is seen today in routine follow-up and feels well.  He specifically denies cough hemoptysis chest tightness or any dysphagia.  COMPLICATIONS OF TREATMENT: none  FOLLOW UP COMPLIANCE: keeps appointments   PHYSICAL EXAM:  BP 112/71 (BP Location: Left Arm, Patient Position: Sitting, Cuff Size: Normal)   Pulse 81   Temp (!) 96.7 F (35.9 C) (Tympanic)   Resp (!) 24   Wt 165 lb 1.6 oz (74.9 kg)   BMI 26.65 kg/m  Well-developed well-nourished patient in NAD. HEENT reveals PERLA, EOMI, discs not visualized.  Oral cavity is clear. No oral mucosal lesions are identified. Neck is clear without evidence of cervical or supraclavicular adenopathy. Lungs are clear to A&P. Cardiac examination is essentially unremarkable with regular rate and rhythm without murmur rub or thrill. Abdomen is benign with no organomegaly or masses noted. Motor sensory and DTR levels are equal and symmetric in the upper and lower extremities. Cranial nerves II through XII are grossly intact. Proprioception is intact. No peripheral adenopathy or edema is identified. No motor or sensory  levels are noted. Crude visual fields are within normal range.  RADIOLOGY RESULTS: No current films were reviewed repeat CT scan ordered for September  PLAN: Present time patient is doing well.  I am pleased with his overall progress.  Of asked to see him back in September after his CT scan of the chest.  Patient continues close follow-up care with medical oncology.  Patient knows to call with any concerns.  I would like to take this opportunity to thank you for allowing me to participate in the care of your patient.Noreene Filbert, MD

## 2019-08-20 ENCOUNTER — Other Ambulatory Visit: Payer: Self-pay | Admitting: Primary Care

## 2019-08-20 DIAGNOSIS — E785 Hyperlipidemia, unspecified: Secondary | ICD-10-CM

## 2019-08-29 ENCOUNTER — Other Ambulatory Visit: Payer: Self-pay

## 2019-08-29 ENCOUNTER — Inpatient Hospital Stay: Payer: Medicare PPO | Attending: Oncology

## 2019-08-29 DIAGNOSIS — Z7901 Long term (current) use of anticoagulants: Secondary | ICD-10-CM | POA: Insufficient documentation

## 2019-08-29 DIAGNOSIS — I2782 Chronic pulmonary embolism: Secondary | ICD-10-CM | POA: Diagnosis not present

## 2019-08-29 DIAGNOSIS — D539 Nutritional anemia, unspecified: Secondary | ICD-10-CM | POA: Diagnosis not present

## 2019-08-29 DIAGNOSIS — Z79899 Other long term (current) drug therapy: Secondary | ICD-10-CM | POA: Diagnosis not present

## 2019-08-29 DIAGNOSIS — C3411 Malignant neoplasm of upper lobe, right bronchus or lung: Secondary | ICD-10-CM | POA: Insufficient documentation

## 2019-08-29 DIAGNOSIS — Z95828 Presence of other vascular implants and grafts: Secondary | ICD-10-CM

## 2019-08-29 DIAGNOSIS — M069 Rheumatoid arthritis, unspecified: Secondary | ICD-10-CM | POA: Diagnosis not present

## 2019-08-29 MED ORDER — HEPARIN SOD (PORK) LOCK FLUSH 100 UNIT/ML IV SOLN
500.0000 [IU] | Freq: Once | INTRAVENOUS | Status: AC
  Administered 2019-08-29: 500 [IU] via INTRAVENOUS
  Filled 2019-08-29: qty 5

## 2019-08-29 MED ORDER — SODIUM CHLORIDE 0.9% FLUSH
10.0000 mL | Freq: Once | INTRAVENOUS | Status: AC
  Administered 2019-08-29: 10 mL via INTRAVENOUS
  Filled 2019-08-29: qty 10

## 2019-09-19 ENCOUNTER — Other Ambulatory Visit: Payer: Self-pay | Admitting: Internal Medicine

## 2019-09-19 DIAGNOSIS — I480 Paroxysmal atrial fibrillation: Secondary | ICD-10-CM

## 2019-09-19 NOTE — Telephone Encounter (Signed)
Prescription refill request for Eliquis received. Indication:  Atrial Fibrillation Last office visit:  03/18/2019 Dr Caryl Comes Scr: 1.20  07/25/2019 Age: 79  Weight: 74.9 KG  Prescription refilled

## 2019-09-26 ENCOUNTER — Inpatient Hospital Stay
Admission: EM | Admit: 2019-09-26 | Discharge: 2019-09-28 | DRG: 190 | Disposition: A | Discharge status: Home / Self Care | Payer: Medicare PPO | Attending: Student | Admitting: Student

## 2019-09-26 ENCOUNTER — Encounter: Payer: Self-pay | Admitting: Emergency Medicine

## 2019-09-26 ENCOUNTER — Emergency Department: Payer: Medicare PPO

## 2019-09-26 ENCOUNTER — Other Ambulatory Visit: Payer: Self-pay

## 2019-09-26 ENCOUNTER — Telehealth: Payer: Self-pay | Admitting: Pulmonary Disease

## 2019-09-26 DIAGNOSIS — G8929 Other chronic pain: Secondary | ICD-10-CM | POA: Diagnosis present

## 2019-09-26 DIAGNOSIS — K219 Gastro-esophageal reflux disease without esophagitis: Secondary | ICD-10-CM | POA: Diagnosis present

## 2019-09-26 DIAGNOSIS — Z7901 Long term (current) use of anticoagulants: Secondary | ICD-10-CM

## 2019-09-26 DIAGNOSIS — Z20822 Contact with and (suspected) exposure to covid-19: Secondary | ICD-10-CM | POA: Diagnosis present

## 2019-09-26 DIAGNOSIS — J9601 Acute respiratory failure with hypoxia: Secondary | ICD-10-CM | POA: Diagnosis not present

## 2019-09-26 DIAGNOSIS — J441 Chronic obstructive pulmonary disease with (acute) exacerbation: Secondary | ICD-10-CM | POA: Diagnosis not present

## 2019-09-26 DIAGNOSIS — Z9221 Personal history of antineoplastic chemotherapy: Secondary | ICD-10-CM

## 2019-09-26 DIAGNOSIS — Z86711 Personal history of pulmonary embolism: Secondary | ICD-10-CM

## 2019-09-26 DIAGNOSIS — Z87891 Personal history of nicotine dependence: Secondary | ICD-10-CM

## 2019-09-26 DIAGNOSIS — Z950 Presence of cardiac pacemaker: Secondary | ICD-10-CM

## 2019-09-26 DIAGNOSIS — I480 Paroxysmal atrial fibrillation: Secondary | ICD-10-CM | POA: Diagnosis present

## 2019-09-26 DIAGNOSIS — Z79899 Other long term (current) drug therapy: Secondary | ICD-10-CM

## 2019-09-26 DIAGNOSIS — Z801 Family history of malignant neoplasm of trachea, bronchus and lung: Secondary | ICD-10-CM

## 2019-09-26 DIAGNOSIS — Z85828 Personal history of other malignant neoplasm of skin: Secondary | ICD-10-CM

## 2019-09-26 DIAGNOSIS — I4891 Unspecified atrial fibrillation: Secondary | ICD-10-CM | POA: Diagnosis present

## 2019-09-26 DIAGNOSIS — R0902 Hypoxemia: Secondary | ICD-10-CM | POA: Diagnosis not present

## 2019-09-26 DIAGNOSIS — C3491 Malignant neoplasm of unspecified part of right bronchus or lung: Secondary | ICD-10-CM | POA: Diagnosis present

## 2019-09-26 DIAGNOSIS — E785 Hyperlipidemia, unspecified: Secondary | ICD-10-CM | POA: Diagnosis present

## 2019-09-26 DIAGNOSIS — M069 Rheumatoid arthritis, unspecified: Secondary | ICD-10-CM | POA: Diagnosis present

## 2019-09-26 DIAGNOSIS — Z923 Personal history of irradiation: Secondary | ICD-10-CM

## 2019-09-26 LAB — CBC WITH DIFFERENTIAL/PLATELET
Abs Immature Granulocytes: 0.02 10*3/uL (ref 0.00–0.07)
Basophils Absolute: 0.1 10*3/uL (ref 0.0–0.1)
Basophils Relative: 1 %
Eosinophils Absolute: 0.2 10*3/uL (ref 0.0–0.5)
Eosinophils Relative: 4 %
HCT: 43.9 % (ref 39.0–52.0)
Hemoglobin: 15.4 g/dL (ref 13.0–17.0)
Immature Granulocytes: 0 %
Lymphocytes Relative: 11 %
Lymphs Abs: 0.5 10*3/uL — ABNORMAL LOW (ref 0.7–4.0)
MCH: 36 pg — ABNORMAL HIGH (ref 26.0–34.0)
MCHC: 35.1 g/dL (ref 30.0–36.0)
MCV: 102.6 fL — ABNORMAL HIGH (ref 80.0–100.0)
Monocytes Absolute: 0.5 10*3/uL (ref 0.1–1.0)
Monocytes Relative: 10 %
Neutro Abs: 3.7 10*3/uL (ref 1.7–7.7)
Neutrophils Relative %: 74 %
Platelets: 132 10*3/uL — ABNORMAL LOW (ref 150–400)
RBC: 4.28 MIL/uL (ref 4.22–5.81)
RDW: 16.1 % — ABNORMAL HIGH (ref 11.5–15.5)
WBC: 5 10*3/uL (ref 4.0–10.5)
nRBC: 0 % (ref 0.0–0.2)

## 2019-09-26 LAB — COMPREHENSIVE METABOLIC PANEL
ALT: 21 U/L (ref 0–44)
AST: 40 U/L (ref 15–41)
Albumin: 4.1 g/dL (ref 3.5–5.0)
Alkaline Phosphatase: 95 U/L (ref 38–126)
Anion gap: 11 (ref 5–15)
BUN: 19 mg/dL (ref 8–23)
CO2: 24 mmol/L (ref 22–32)
Calcium: 9.6 mg/dL (ref 8.9–10.3)
Chloride: 100 mmol/L (ref 98–111)
Creatinine, Ser: 1.08 mg/dL (ref 0.61–1.24)
GFR calc Af Amer: >60 mL/min (ref >60)
GFR calc non Af Amer: >60 mL/min (ref >60)
Glucose, Bld: 107 mg/dL — ABNORMAL HIGH (ref 70–99)
Potassium: 4.9 mmol/L (ref 3.5–5.1)
Sodium: 135 mmol/L (ref 135–145)
Total Bilirubin: 1.2 mg/dL (ref 0.3–1.2)
Total Protein: 7.6 g/dL (ref 6.5–8.1)

## 2019-09-26 LAB — SARS CORONAVIRUS 2 BY RT PCR (HOSPITAL ORDER, PERFORMED IN ~~LOC~~ HOSPITAL LAB): SARS Coronavirus 2: NEGATIVE

## 2019-09-26 MED ORDER — FAMOTIDINE 20 MG PO TABS
20.0000 mg | ORAL_TABLET | Freq: Two times a day (BID) | ORAL | Status: DC
  Administered 2019-09-26 – 2019-09-28 (×4): 20 mg via ORAL
  Filled 2019-09-26 (×4): qty 1

## 2019-09-26 MED ORDER — ONDANSETRON HCL 4 MG/2ML IJ SOLN: 4.0000 mg | Freq: Four times a day (QID) | INTRAMUSCULAR | Status: DC | PRN

## 2019-09-26 MED ORDER — ALBUTEROL SULFATE (2.5 MG/3ML) 0.083% IN NEBU: 2.5000 mg | INHALATION_SOLUTION | RESPIRATORY_TRACT | Status: DC | PRN

## 2019-09-26 MED ORDER — BRIMONIDINE TARTRATE 0.15 % OP SOLN
1.0000 [drp] | Freq: Two times a day (BID) | OPHTHALMIC | Status: DC
  Administered 2019-09-27 (×2): 1 [drp] via OPHTHALMIC
  Filled 2019-09-26: qty 5

## 2019-09-26 MED ORDER — APIXABAN 5 MG PO TABS
5.0000 mg | ORAL_TABLET | Freq: Two times a day (BID) | ORAL | Status: DC
  Administered 2019-09-26 – 2019-09-28 (×4): 5 mg via ORAL
  Filled 2019-09-26 (×4): qty 1

## 2019-09-26 MED ORDER — ACETAMINOPHEN 650 MG RE SUPP: 650.0000 mg | Freq: Four times a day (QID) | RECTAL | Status: DC | PRN

## 2019-09-26 MED ORDER — FOLIC ACID 1 MG PO TABS
1.0000 mg | ORAL_TABLET | Freq: Every day | ORAL | Status: DC
  Administered 2019-09-27 – 2019-09-28 (×2): 1 mg via ORAL
  Filled 2019-09-26 (×2): qty 1

## 2019-09-26 MED ORDER — METHYLPREDNISOLONE SODIUM SUCC 40 MG IJ SOLR
40.0000 mg | Freq: Two times a day (BID) | INTRAMUSCULAR | Status: DC
  Administered 2019-09-27: 40 mg via INTRAVENOUS
  Filled 2019-09-26: qty 1

## 2019-09-26 MED ORDER — ONDANSETRON HCL 4 MG PO TABS: 4.0000 mg | ORAL_TABLET | Freq: Four times a day (QID) | ORAL | Status: DC | PRN

## 2019-09-26 MED ORDER — FENOFIBRATE 160 MG PO TABS
160.0000 mg | ORAL_TABLET | Freq: Every day | ORAL | Status: DC
  Administered 2019-09-27 – 2019-09-28 (×2): 160 mg via ORAL
  Filled 2019-09-26 (×2): qty 1

## 2019-09-26 MED ORDER — CHLORPROMAZINE HCL 25 MG PO TABS
25.0000 mg | ORAL_TABLET | Freq: Three times a day (TID) | ORAL | Status: DC
  Administered 2019-09-27 – 2019-09-28 (×4): 25 mg via ORAL
  Filled 2019-09-26 (×7): qty 1

## 2019-09-26 MED ORDER — AZITHROMYCIN 250 MG PO TABS
250.0000 mg | ORAL_TABLET | Freq: Every day | ORAL | Status: DC
  Administered 2019-09-27 – 2019-09-28 (×2): 250 mg via ORAL
  Filled 2019-09-26 (×2): qty 1

## 2019-09-26 MED ORDER — IPRATROPIUM-ALBUTEROL 0.5-2.5 (3) MG/3ML IN SOLN
3.0000 mL | Freq: Once | RESPIRATORY_TRACT | Status: AC
  Administered 2019-09-26: 3 mL via RESPIRATORY_TRACT
  Filled 2019-09-26: qty 3

## 2019-09-26 MED ORDER — ACETAMINOPHEN 325 MG PO TABS
650.0000 mg | ORAL_TABLET | Freq: Four times a day (QID) | ORAL | Status: DC | PRN
  Administered 2019-09-27: 650 mg via ORAL
  Filled 2019-09-26: qty 2

## 2019-09-26 MED ORDER — METHYLPREDNISOLONE SODIUM SUCC 125 MG IJ SOLR
125.0000 mg | Freq: Once | INTRAMUSCULAR | Status: AC
  Administered 2019-09-26: 125 mg via INTRAVENOUS
  Filled 2019-09-26: qty 2

## 2019-09-26 MED ORDER — GABAPENTIN 100 MG PO CAPS
100.0000 mg | ORAL_CAPSULE | Freq: Three times a day (TID) | ORAL | Status: DC
  Administered 2019-09-27 – 2019-09-28 (×4): 100 mg via ORAL
  Filled 2019-09-26 (×5): qty 1

## 2019-09-26 MED ORDER — IPRATROPIUM-ALBUTEROL 0.5-2.5 (3) MG/3ML IN SOLN
3.0000 mL | Freq: Three times a day (TID) | RESPIRATORY_TRACT | Status: DC
  Administered 2019-09-27: 3 mL via RESPIRATORY_TRACT
  Filled 2019-09-26: qty 30

## 2019-09-26 MED ORDER — GUAIFENESIN ER 600 MG PO TB12
600.0000 mg | ORAL_TABLET | Freq: Two times a day (BID) | ORAL | Status: DC
  Administered 2019-09-26 – 2019-09-28 (×4): 600 mg via ORAL
  Filled 2019-09-26 (×4): qty 1

## 2019-09-26 MED ORDER — SODIUM CHLORIDE 0.9 % IV SOLN
500.0000 mg | Freq: Once | INTRAVENOUS | Status: AC
  Administered 2019-09-26: 500 mg via INTRAVENOUS
  Filled 2019-09-26: qty 500

## 2019-09-26 MED ORDER — TRAMADOL HCL 50 MG PO TABS
25.0000 mg | ORAL_TABLET | Freq: Every evening | ORAL | Status: DC | PRN
  Administered 2019-09-26: 25 mg via ORAL
  Filled 2019-09-26: qty 1

## 2019-09-26 MED ORDER — SALINE SPRAY 0.65 % NA SOLN
1.0000 | Freq: Every evening | NASAL | Status: DC | PRN
  Filled 2019-09-26: qty 44

## 2019-09-26 NOTE — Telephone Encounter (Signed)
Noted.  Will close encounter.  

## 2019-09-26 NOTE — ED Notes (Signed)
Pt c/o sob and productive cough. Pt reports he had to use wife's oxygen this morning as his saturations were 70%.

## 2019-09-26 NOTE — ED Triage Notes (Signed)
First RN note: pt presents to ED via [facility] with c/o SOB since yesterday. Per EMS pt began having SOB while walking from a car to doctors office. Hx of CHF, COPD, emphysema.    86% RA 2L brought patient to 96-98%  138/86 70

## 2019-09-26 NOTE — ED Provider Notes (Signed)
[facility] Emergency Department Provider Note   ____________________________________________   I have reviewed the triage vital signs and the nursing notes.   HISTORY  Chief Complaint Shortness of Breath   History limited by: Not Limited   HPI Darryl White is a 79 y.o. male who presents to the emergency department today because of concerns for shortness of breath and hypoxia.  Patient states that the shortness of breath started yesterday while he was helping a friend get to a doctor's appointment.  It then continued this morning.  He does not have oxygen at home although his wife is prescribed oxygen so he states that he actually used it yesterday.  This morning he checked his pulse ox and found that he was in the 70s on room air.  He denies any significant chest pain with this.  Denies any fevers.  Denies any known sick contacts.  States he has had his Covid vaccine.   Records reviewed. Per medical record review patient has a history of lung cancer, COPD.  Past Medical History:  Diagnosis Date  . Arthritis 2019   dx of osteoarthritis and rhemautoid arthritis  . Cancer ([place])    lung  . Cataract 2019   left eye  . Chicken pox   . Collagen vascular disease ([place])   . 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 COPD (chronic obstructive pulmonary disease) ([place])   . Diverticulitis   . GERD (gastroesophageal reflux disease)   . 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 History of hiatal hernia   . History of stomach ulcers   . Hyperlipidemia   . Lymphocytic colitis   . Orthostatic hypotension   . Osteoarthritis   . Pancreatitis   . Paroxysmal atrial fibrillation (HCC)   . Presence of permanent cardiac pacemaker   . Small cell lung cancer ([place]) 04/15/2019    Patient Active Problem List   Diagnosis Date Noted  . Malnutrition of moderate degree 05/28/2019  . Thrombocytopenia ([place])   . Anemia   . Neutropenic fever ([place]) 05/27/2019  .  Small cell lung cancer in adult [facility]) 04/15/2019  . Malignant neoplasm of right lung ([place]) 04/11/2019  . Goals of care, counseling/discussion 04/11/2019  . Pulmonary nodules 03/13/2019  . GERD (gastroesophageal reflux disease) 03/13/2019  . Decreased renal function 03/04/2018  . COPD with acute exacerbation ([place]) 02/22/2018  . Cervical spondylosis 01/30/2018  . Preventative health care 03/01/2017  . 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 Diverticulitis 06/19/2016  . Skin cancer (melanoma) ([place]) 06/19/2016  . Change in multiple nevi 05/03/2016  . Personal history of tobacco use, presenting hazards to health 03/17/2016  . Rheumatoid arthritis ([place]) 02/29/2016  . Rheumatoid arthritis, seropositive ([place]) 01/04/2016  . Encounter for long-term (current) use of high-risk medication 01/04/2016  . Acute gout of left hand 12/02/2015  . Positive anti-CCP test 12/02/2015  . Acute joint pain 10/18/2015  . Elevated rheumatoid factor 10/18/2015  . Osteoarthritis 10/07/2015  . Atrial fibrillation ([place]) 10/12/2014  . Hypotension 10/12/2014  . HLD (hyperlipidemia) 01/27/2014  . History of syncope 01/27/2014  . History of colonic polyps 01/27/2014  . Cardiac pacemaker 01/27/2014  . Hypertriglyceridemia 01/27/2014  . Nocturia associated with benign prostatic hypertrophy 01/27/2014  . Tobacco abuse 01/27/2014    Past Surgical History:  Procedure Laterality Date  . APPENDECTOMY    . BACK SURGERY     bone chip lasered  . CARDIAC  CATHETERIZATION    . CATARACT EXTRACTION W/ INTRAOCULAR LENS IMPLANT Right   . COLON SURGERY  1990's   12" removed of large intestine  . EYE SURGERY    . INSERT / REPLACE / [place]  . large intestine block    . PACEMAKER INSERTION     MDT dual chamber pacemaker  . PORTA CATH INSERTION N/A 04/17/2019   Procedure: PORTA CATH INSERTION;  Surgeon: Algernon Huxley, MD;  Location: [facility];  Service: Cardiovascular;  Laterality: N/A;  .  PPM GENERATOR CHANGEOUT N/A 12/02/2018   Procedure: PPM GENERATOR CHANGEOUT;  Surgeon: Deboraha Sprang, MD;  Location: [place] [facility];  Service: Cardiovascular;  Laterality: N/A;  . TONSILLECTOMY AND ADENOIDECTOMY    . VIDEO BRONCHOSCOPY WITH ENDOBRONCHIAL ULTRASOUND N/A 04/07/2019   Procedure: VIDEO BRONCHOSCOPY WITH ENDOBRONCHIAL ULTRASOUND;  Surgeon: Tyler Pita, MD;  Location: [facility] ORS;  Service: Pulmonary;  Laterality: N/A;    Prior to Admission medications   Medication Sig Start Date End Date Taking? Authorizing Provider  albuterol (PROAIR HFA) 108 (90 Base) MCG/ACT inhaler Inhale 1-2 puffs into the lungs every 6 (six) hours as needed for wheezing or shortness of breath. 06/09/19   Tyler Pita, MD  antiseptic oral rinse (BIOTENE) LIQD 15 mLs by Mouth Rinse route as needed for dry mouth.    [provider]  brimonidine (ALPHAGAN) 0.15 % ophthalmic solution Place 1 drop into both eyes 2 (two) times daily.     [provider]  Budeson-Glycopyrrol-Formoterol (BREZTRI AEROSPHERE) 160-9-4.8 MCG/ACT AERO Inhale 2 puffs into the lungs 2 (two) times daily. 03/24/19   Tyler Pita, MD  chlorhexidine (PERIDEX) 0.12 % solution Use as directed 15 mLs in the mouth or throat in the morning and at bedtime. 06/18/19   Earlie Server, MD  chlorproMAZINE (THORAZINE) 25 MG tablet Take 1 tablet (25 mg total) by mouth 3 (three) times daily. 04/25/19   Lloyd Huger, MD  Cholecalciferol (VITAMIN D3) 50 MCG (2000 UT) capsule Take 1 capsule (2,000 Units total) by mouth daily with lunch. 05/30/19   Lorella Nimrod, MD  dronabinol (MARINOL) 5 MG capsule Take 1 capsule (5 mg total) by mouth 2 (two) times daily before a meal. Patient not taking: Reported on 06/18/2019 06/04/19   Earlie Server, MD  ELIQUIS 5 MG TABS tablet TAKE 1 TABLET BY MOUTH TWICE DAILY 09/19/19   Deboraha Sprang, MD  famotidine (PEPCID) 20 MG tablet Take 20 mg by mouth 2 (two) times daily.    [provider]   fenofibrate (TRICOR) 145 MG tablet TAKE 1 TABLET(145 MG) BY MOUTH DAILY 08/20/19   Pleas Koch, NP  folic acid (FOLVITE) 1 MG tablet Take 1 mg by mouth daily.    [provider]  furosemide (LASIX) 20 MG tablet Take 1 tablet (20 mg total) by mouth daily as needed for fluid. 06/09/19   Earlie Server, MD  gabapentin (NEURONTIN) 100 MG capsule Take 100 mg by mouth 3 (three) times daily.    [provider]  lidocaine-prilocaine (EMLA) cream Apply to affected area once 04/15/19   Earlie Server, MD  methotrexate 2.5 MG tablet Take 12.5 mg by mouth every Sunday.     [provider]  nystatin (MYCOSTATIN) 100000 UNIT/ML suspension Take 5 mLs (500,000 Units total) by mouth 4 (four) times daily. Swish and swallow 06/18/19   Earlie Server, MD  ondansetron (ZOFRAN) 8 MG tablet Take 1 tablet (8 mg total) by  mouth 2 (two) times daily as needed for refractory nausea / vomiting. Start on day 3 after carboplatin chemo. 04/15/19   Earlie Server, MD  prochlorperazine (COMPAZINE) 10 MG tablet Take 1 tablet (10 mg total) by mouth every 6 (six) hours as needed (Nausea or vomiting). 04/15/19   Earlie Server, MD  sodium chloride (OCEAN) 0.65 % SOLN nasal spray Place 1 spray into both nostrils at bedtime as needed for congestion.     [provider]  traMADol (ULTRAM) 50 MG tablet Take 25 mg by mouth at bedtime as needed for moderate pain.  02/27/18   [provider]    Allergies Sulfa antibiotics and Penicillins  Family History  Problem Relation Age of Onset  . Lung cancer Mother   . Heart disease Father   . Alzheimer's disease Father     Social History Social History   Tobacco Use  . Smoking status: Former Smoker    Packs/day: 1.00    Years: 60.00    Pack years: 60.00    Types: Cigarettes    Quit date: 04/29/2017    Years since quitting: 2.4  . Smokeless tobacco: Never Used  Vaping Use  . Vaping Use: Never used  Substance Use Topics  . Alcohol use: Yes    Alcohol/week: 5.0  standard drinks    Types: 5 Glasses of wine per week  . Drug use: No    Review of Systems Constitutional: No fever/chills Eyes: No visual changes. ENT: No sore throat. Cardiovascular: Denies chest pain. Respiratory: Positive for shortness of breath. Gastrointestinal: No abdominal pain.  No nausea, no vomiting.  No diarrhea.   Genitourinary: Negative for dysuria. Musculoskeletal: Negative for back pain. Skin: Negative for rash. Neurological: Negative for headaches, focal weakness or numbness.  ____________________________________________   PHYSICAL EXAM:  VITAL SIGNS: ED Triage Vitals  Enc Vitals Group     BP 09/26/19 1443 (!) 145/99     Pulse Rate 09/26/19 1443 79     Resp 09/26/19 1443 20     Temp 09/26/19 2019 98.7 F (37.1 C)     Temp Source 09/26/19 2019 Oral     SpO2 09/26/19 1020 (!) 86 %     Weight 09/26/19 1107 165 lb 2 oz (74.9 kg)     Height 09/26/19 1107 5\' 6"  (1.676 m)     Head Circumference --      Peak Flow --      Pain Score 09/26/19 1107 0   Constitutional: Alert and oriented.  Eyes: Conjunctivae are normal.  ENT      Head: Normocephalic and atraumatic.      Nose: No congestion/rhinnorhea.      Mouth/Throat: Mucous membranes are moist.      Neck: No stridor. Hematological/Lymphatic/Immunilogical: No cervical lymphadenopathy. Cardiovascular: Normal rate, regular rhythm.  No murmurs, rubs, or gallops.  Respiratory: Normal respiratory effort without tachypnea nor retractions. Poor air movement.  Gastrointestinal: Soft and non tender. No rebound. No guarding.  Genitourinary: Deferred Musculoskeletal: Normal range of motion in all extremities. No lower extremity edema. Neurologic:  Normal speech and language. No gross focal neurologic deficits are appreciated.  Skin:  Skin is warm, dry and intact. No rash noted. Psychiatric: Mood and affect are normal. Speech and behavior are normal. Patient exhibits appropriate insight and  judgment.  ____________________________________________    LABS (pertinent positives/negatives)  CMP wnl excpt glu 107 CBC wbc 5.0, hgb 15.4, plt 132 COVID negative ____________________________________________   EKG  I, Nance Pear, attending physician, personally  viewed and interpreted this EKG  EKG Time: 1104 Rate: 78 Rhythm: normal sinus rhythm Axis: normal Intervals: qtc 421 QRS: narrow ST changes: no st elevation Impression: normal ekg  ____________________________________________    RADIOLOGY  CXR COPD. Possible pneumonitis.  ____________________________________________   PROCEDURES  Procedures  ____________________________________________   INITIAL IMPRESSION / ASSESSMENT AND PLAN / ED COURSE  Pertinent labs & imaging results that were available during my care of the patient were reviewed by me and considered in my medical decision making (see chart for details).   Patient presented to the emergency department today because of concerns for shortness of breath and hypoxia.  The time my exam I did take the patient off the oxygen and he dropped down into the mid 80s on room air.  Chest x-ray showed COPD and possible pneumonitis.  Will give patient steroids, azithromycin and DuoNeb treatments.  Will plan on admission.  Covid was negative.  ____________________________________________   FINAL CLINICAL IMPRESSION(S) / ED DIAGNOSES  Final diagnoses:  COPD exacerbation ([place])  Hypoxia     Note: This dictation was prepared with Dragon dictation. Any transcriptional errors that result from this process are unintentional     Nance Pear, MD 09/26/19 2155

## 2019-09-26 NOTE — Telephone Encounter (Signed)
Agree with assessment and recommendations.

## 2019-09-26 NOTE — Telephone Encounter (Signed)
Called and spoke to pt's spouse, [name]. Opal Sidles stated that patient is experiencing increased sob. Per Opal Sidles patient's spo2 was at 72% this morning. Spo2 increased to 90% on room air with pursed lip breathing.   Patient is not prescribed oxygen, however his wife is and she placed him on 2L. Patient did not check oxygen level after applying oxygen but stated that he felt better.  Denied edema, fever, chills, sweats, cough, wheezing or additional symptoms.  I have recommended that patient go to ED for evaluation via EMS.  Opal Sidles voiced her understanding and had no further questions.   Will route to Dr. Patsey Berthold.

## 2019-09-26 NOTE — H&P (Signed)
History and Physical    Darryl White FIE:[id number] DOB: 1940/12/08 DOA: 09/26/2019  PCP: Pleas Koch, NP  Patient coming from: Home via EMS  I have personally briefly reviewed patient's old medical records in [place]  Chief Complaint: Shortness of breath, hypoxia  HPI: Darryl White is a 79 y.o. 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  Patient states he was in his usual state of health until the night of 09/25/2019 when he developed new onset severe shortness of breath which would occur with exertion.  He had associated new frequent cough with occasional clear sputum production.  Symptoms would resolve with rest however are persistent and progressive with minimal activity today.  He has not required oxygen at home but says his wife does use oxygen.  He checked his O2 saturation today and it was in the seventies.  He tried his wife supplemental oxygen at 3 L with some symptomatic improvement.  He called his pulmonologist for further advice and was told to call EMS for further evaluation.  He otherwise denies any subjective fevers, chills, diaphoresis, chest pain, abdominal pain, dysuria, diarrhea, or obvious bleeding.  ED Course:  Initial vitals showed BP 145/99, pulse 79, RR 20, temp 98.7 Fahrenheit, SPO2 95% on 3 L of home O2 via .  Per EDP, O2 saturation dropped to 86% on room air when taken off supplemental oxygen.  Labs are notable for WBC 5.0, hemoglobin 15.4, platelets 132,000, sodium 135, potassium 4.9, bicarb 24, BUN 19, creatinine 1.08, LFTs within normal limits.  SARS-CoV-2 PCR is negative.  2 view chest x-ray shows Port-A-Cath in and cardiac pacemaker in place.  Severe bullous COPD changes are again  noted as well as chronic interstitial changes.  Patient was given IV Solu-Medrol 125 mg, IV azithromycin 500 mg, and DuoNeb treatment x3.  Hospitalist service was consulted to admit for further evaluation and management.  Review of Systems: All systems reviewed and are negative except as documented in history of present illness above.   Past Medical History:  Diagnosis Date  . Arthritis 2019   dx of osteoarthritis and rhemautoid arthritis  . Cancer ([place])    lung  . Cataract 2019   left eye  . Chicken pox   . Collagen vascular disease ([place])   . 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 COPD (chronic obstructive pulmonary disease) ([place])   . Diverticulitis   . GERD (gastroesophageal reflux disease)   . 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 History of hiatal hernia   . History of stomach ulcers   . Hyperlipidemia   . Lymphocytic colitis   . Orthostatic hypotension   . Osteoarthritis   . Pancreatitis   . Paroxysmal atrial fibrillation (HCC)   . Presence of permanent cardiac pacemaker   . 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 APPENDECTOMY    . BACK SURGERY     bone chip lasered  . CARDIAC CATHETERIZATION    . CATARACT EXTRACTION W/ INTRAOCULAR LENS IMPLANT Right   . COLON SURGERY  1990's   12" removed of large intestine  . EYE SURGERY    . INSERT / REPLACE / REMOVE PACEMAKER  1993  . large intestine block    . PACEMAKER INSERTION     MDT dual chamber pacemaker  . PORTA CATH INSERTION N/A 04/17/2019   Procedure: PORTA CATH INSERTION;  Surgeon: Algernon Huxley, MD;  Location: [facility];  Service: Cardiovascular;  Laterality: N/A;  . PPM GENERATOR CHANGEOUT N/A 12/02/2018   Procedure: PPM GENERATOR CHANGEOUT;  Surgeon: Deboraha Sprang, MD;  Location: [facility];  Service: Cardiovascular;  Laterality: N/A;  . TONSILLECTOMY AND ADENOIDECTOMY    . VIDEO BRONCHOSCOPY WITH ENDOBRONCHIAL  ULTRASOUND N/A 04/07/2019   Procedure: VIDEO BRONCHOSCOPY WITH ENDOBRONCHIAL ULTRASOUND;  Surgeon: Tyler Pita, MD;  Location: [facility] OR[facility];  Service: Pulmonary;  Laterality: N/A;    Social History:  reports that he quit smoking about 2 years ago. His smoking use included cigarettes. He has a 60.00 pack-year smoking history. He has never used smokeless tobacco. He reports current alcohol use of about 5.0 standard drinks of alcohol per week. He reports that he does not use drugs.  Allergies  Allergen Reactions  . Sulfa Antibiotics Hives and Itching  . Penicillins Hives and Palpitations    Did it involve swelling of the face/tongue/throat, SOB, or low BP? No Did it involve sudden or severe rash/hives, skin peeling, or any reaction on the inside of your mouth or nose? No Did you need to seek medical attention at a hospital or doctor's office? Unknown When did it last happen?childhood allergy If all above answers are "NO", may proceed with cephalosporin use.     Family History  Problem Relation Age of Onset  . Lung cancer Mother   . Heart disease Father   . Alzheimer's disease Father      Prior to Admission medications   Medication Sig Start Date End Date Taking? Authorizing Provider  albuterol (PROAIR HFA) 108 (90 Base) MCG/ACT inhaler Inhale 1-2 puffs into the lungs every 6 (six) hours as needed for wheezing or shortness of breath. 06/09/19   Tyler Pita, MD  antiseptic oral rinse (BIOTENE) LIQD 15 mLs by Mouth Rinse route as needed for dry mouth.    [provider]  brimonidine (ALPHAGAN) 0.15 % ophthalmic solution Place 1 drop into both eyes 2 (two) times daily.     [provider]  Budeson-Glycopyrrol-Formoterol (BREZTRI AEROSPHERE) 160-9-4.8 MCG/ACT AERO Inhale 2 puffs into the lungs 2 (two) times daily. 03/24/19   Tyler Pita, MD  chlorhexidine (PERIDEX) 0.12 % solution Use as directed 15 mLs in the mouth or throat in the morning and at  bedtime. 06/18/19   Earlie Server, MD  chlorproMAZINE (THORAZINE) 25 MG tablet Take 1 tablet (25 mg total) by mouth 3 (three) times daily. 04/25/19   Lloyd Huger, MD  Cholecalciferol (VITAMIN D3) 50 MCG (2000 UT) capsule Take 1 capsule (2,000 Units total) by mouth daily with lunch. 05/30/19   Lorella Nimrod, MD  dronabinol (MARINOL) 5 MG capsule Take 1 capsule (5 mg total) by mouth 2 (two) times daily before a meal. Patient not taking: Reported on 06/18/2019 06/04/19   Earlie Server, MD  ELIQUIS 5 MG TABS tablet TAKE 1 TABLET BY MOUTH TWICE DAILY 09/19/19   Deboraha Sprang, MD  famotidine (PEPCID) 20 MG tablet Take 20 mg by mouth 2 (two) times daily.    [provider]  fenofibrate (TRICOR) 145 MG tablet TAKE 1 TABLET(145 MG) BY MOUTH DAILY 08/20/19   Pleas Koch, NP  folic acid (FOLVITE) 1 MG tablet Take 1  mg by mouth daily.    [provider]  furosemide (LASIX) 20 MG tablet Take 1 tablet (20 mg total) by mouth daily as needed for fluid. 06/09/19   Earlie Server, MD  gabapentin (NEURONTIN) 100 MG capsule Take 100 mg by mouth 3 (three) times daily.    [provider]  lidocaine-prilocaine (EMLA) cream Apply to affected area once 04/15/19   Earlie Server, MD  methotrexate 2.5 MG tablet Take 12.5 mg by mouth every Sunday.     [provider]  nystatin (MYCOSTATIN) 100000 UNIT/ML suspension Take 5 mLs (500,000 Units total) by mouth 4 (four) times daily. Swish and swallow 06/18/19   Earlie Server, MD  ondansetron (ZOFRAN) 8 MG tablet Take 1 tablet (8 mg total) by mouth 2 (two) times daily as needed for refractory nausea / vomiting. Start on day 3 after carboplatin chemo. 04/15/19   Earlie Server, MD  prochlorperazine (COMPAZINE) 10 MG tablet Take 1 tablet (10 mg total) by mouth every 6 (six) hours as needed (Nausea or vomiting). 04/15/19   Earlie Server, MD  sodium chloride (OCEAN) 0.65 % SOLN nasal spray Place 1 spray into both nostrils at bedtime as needed for congestion.     [provider]   traMADol (ULTRAM) 50 MG tablet Take 25 mg by mouth at bedtime as needed for moderate pain.  02/27/18   [provider]    Physical Exam: Vitals:   09/26/19 2017 09/26/19 2018 09/26/19 2019 09/26/19 2028  BP:    129/87  Pulse:  92  87  Resp:      Temp:   98.7 F (37.1 C)   TempSrc:   Oral   SpO2: 96% 93%  99%  Weight:      Height:       Constitutional: Sitting up in bed, NAD, calm, comfortable Eyes: PERRL, lids and conjunctivae normal ENMT: Mucous membranes are moist. Posterior pharynx clear of any exudate or lesions.Normal dentition.  Neck: normal, supple, no masses. Respiratory: Distant breath sounds with faint inspiratory crackles left lung base. Normal respiratory effort. No accessory muscle use.  Cardiovascular: Regular rate and rhythm, no murmurs / rubs / gallops. No extremity edema. 2+ pedal pulses.  Port-A-Cath in place right chest wall.  PPM in place left chest wall. Abdomen: no tenderness, no masses palpated. No hepatosplenomegaly. Bowel sounds positive.  Musculoskeletal: no clubbing / cyanosis. No joint deformity upper and lower extremities. Good ROM, no contractures. Normal muscle tone.  Skin: no rashes, lesions, ulcers. No induration Neurologic: CN 2-12 grossly intact. Sensation intact, Strength 5/5 in all 4.  Psychiatric: Normal judgment and insight. Alert and oriented x 3. Normal mood.   Labs on Admission: I have personally reviewed following labs and imaging studies  CBC: Recent Labs  Lab 09/26/19 1118  WBC 5.0  NEUTROABS 3.7  HGB 15.4  HCT 43.9  MCV 102.6*  PLT 433*   Basic Metabolic Panel: Recent Labs  Lab 09/26/19 1118  NA 135  K 4.9  CL 100  CO2 24  GLUCOSE 107*  BUN 19  CREATININE 1.08  CALCIUM 9.6   GFR: Estimated Creatinine Clearance: 50.9 mL/min (by C-G formula based on SCr of 1.08 mg/dL). Liver Function Tests: Recent Labs  Lab 09/26/19 1118  AST 40  ALT 21  ALKPHOS 95  BILITOT 1.2  PROT 7.6  ALBUMIN 4.1   No results  for input(s): LIPASE, AMYLASE in the last 168 hours. No results for input(s): AMMONIA in the last 168 hours. Coagulation Profile:  No results for input(s): INR, PROTIME in the last 168 hours. Cardiac Enzymes: No results for input(s): CKTOTAL, CKMB, CKMBINDEX, TROPONINI in the last 168 hours. BNP (last 3 results) No results for input(s): PROBNP in the last 8760 hours. HbA1C: No results for input(s): HGBA1C in the last 72 hours. CBG: No results for input(s): GLUCAP in the last 168 hours. Lipid Profile: No results for input(s): CHOL, HDL, LDLCALC, TRIG, CHOLHDL, LDLDIRECT in the last 72 hours. Thyroid Function Tests: No results for input(s): TSH, T4TOTAL, FREET4, T3FREE, THYROIDAB in the last 72 hours. Anemia Panel: No results for input(s): VITAMINB12, FOLATE, FERRITIN, TIBC, IRON, RETICCTPCT in the last 72 hours. Urine analysis:    Component Value Date/Time   COLORURINE YELLOW (A) 05/27/2019 1205   APPEARANCEUR HAZY (A) 05/27/2019 1205   LABSPEC 1.023 05/27/2019 1205   PHURINE 6.0 05/27/2019 1205   GLUCOSEU NEGATIVE 05/27/2019 1205   HGBUR NEGATIVE 05/27/2019 1205   [place] 05/27/2019 1205   KETONESUR NEGATIVE 05/27/2019 1205   PROTEINUR NEGATIVE 05/27/2019 1205   UROBILINOGEN 0.2 10/12/2014 1919   NITRITE NEGATIVE 05/27/2019 [place] 05/27/2019 1205    Radiological Exams on Admission: DG Chest 2 View  Result Date: 09/26/2019 CLINICAL DATA:  Shortness of breath. EXAM: CHEST - 2 VIEW COMPARISON:  CT 07/23/2019, 03/30/2017.  Chest x-ray 05/27/2019. FINDINGS: PowerPort catheter in stable position. Cardiac pacer stable position. Heart size stable. Severe changes of bullous COPD again noted. Chronic interstitial changes are again noted. Interstitial prominence slightly greater on today's exam. An active interstitial process such as pneumonitis cannot be excluded. No pleural effusion or pneumothorax. IMPRESSION: 1. PowerPort catheter stable position.  Cardiac pacer stable position. Heart size stable. 2. Severe changes of bullous COPD again noted. Chronic interstitial changes again noted. Interstitial prominence slightly greater on today's exam. Active interstitial process such as pneumonitis cannot be excluded. Electronically Signed   By: Marcello Moores  Register   On: 09/26/2019 11:35    EKG: Independently reviewed. Normal sinus rhythm without acute ischemic changes.  Not significantly changed when compared to prior.  Assessment/Plan Principal Problem:   Acute respiratory failure with hypoxia (HCC) Active Problems:   HLD (hyperlipidemia)   Cardiac pacemaker   Atrial fibrillation (HCC)   Rheumatoid arthritis (HCC)   COPD with acute exacerbation (HCC)   Malignant neoplasm of right lung (HCC)  Darryl White is a 79 y.o. 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  Acute respiratory failure with hypoxia secondary to acute exacerbation of COPD: Symptoms improving after initial ED management. -Continue IV Solu-Medrol 40 mg twice daily -Continue scheduled duo nebs and as needed albuterol -Continue supplemental oxygen and wean down as able -Continue oral azithromycin  Paroxysmal atrial fibrillation: CHA2DS2-VASc score 2.  Remains in sinus rhythm on admission.  Not requiring rate control medications as an outpatient. -Continue Eliquis  Right lung cancer, suspected small cell lung cancer: Follows with oncology Dr. Tasia Catchings in radiation oncology Dr. Baruch Gouty.  S/p concurrent chemoradiation-carboplatin and etoposide.  CHB s/p PPM: PPM in place.  Hyperlipidemia: Continue fenofibrate.  Rheumatoid arthritis: Holding home methotrexate for now.  DVT prophylaxis:  Eliquis Code Status: Full code, confirmed with patient Family Communication: Discussed with patient, he has discussed with family Disposition Plan: From home and likely discharge to home pending symptomatic  improvement and home oxygen eval Consults called: None Admission status:  Status is: Observation  The patient remains OBS appropriate and will d/c before 2 midnights.  Dispo: The patient is from: Home              Anticipated d/c is to: Home              Anticipated d/c date is: 1 day              Patient currently is not medically stable to d/c.  Zada Finders MD [facility]  If 7PM-7AM, please contact night-coverage [email or web address]  09/26/2019, 9:54 PM

## 2019-09-27 DIAGNOSIS — K219 Gastro-esophageal reflux disease without esophagitis: Secondary | ICD-10-CM | POA: Diagnosis present

## 2019-09-27 DIAGNOSIS — E785 Hyperlipidemia, unspecified: Secondary | ICD-10-CM | POA: Diagnosis present

## 2019-09-27 DIAGNOSIS — Z85828 Personal history of other malignant neoplasm of skin: Secondary | ICD-10-CM | POA: Diagnosis not present

## 2019-09-27 DIAGNOSIS — G8929 Other chronic pain: Secondary | ICD-10-CM | POA: Diagnosis present

## 2019-09-27 DIAGNOSIS — Z801 Family history of malignant neoplasm of trachea, bronchus and lung: Secondary | ICD-10-CM | POA: Diagnosis not present

## 2019-09-27 DIAGNOSIS — J9601 Acute respiratory failure with hypoxia: Secondary | ICD-10-CM

## 2019-09-27 DIAGNOSIS — I480 Paroxysmal atrial fibrillation: Secondary | ICD-10-CM

## 2019-09-27 DIAGNOSIS — Z923 Personal history of irradiation: Secondary | ICD-10-CM | POA: Diagnosis not present

## 2019-09-27 DIAGNOSIS — J441 Chronic obstructive pulmonary disease with (acute) exacerbation: Secondary | ICD-10-CM | POA: Diagnosis present

## 2019-09-27 DIAGNOSIS — Z950 Presence of cardiac pacemaker: Secondary | ICD-10-CM | POA: Diagnosis not present

## 2019-09-27 DIAGNOSIS — M0579 Rheumatoid arthritis with rheumatoid factor of multiple sites without organ or systems involvement: Secondary | ICD-10-CM

## 2019-09-27 DIAGNOSIS — Z7901 Long term (current) use of anticoagulants: Secondary | ICD-10-CM | POA: Diagnosis not present

## 2019-09-27 DIAGNOSIS — Z86711 Personal history of pulmonary embolism: Secondary | ICD-10-CM

## 2019-09-27 DIAGNOSIS — Z79899 Other long term (current) drug therapy: Secondary | ICD-10-CM | POA: Diagnosis not present

## 2019-09-27 DIAGNOSIS — C3491 Malignant neoplasm of unspecified part of right bronchus or lung: Secondary | ICD-10-CM | POA: Diagnosis present

## 2019-09-27 DIAGNOSIS — R0902 Hypoxemia: Secondary | ICD-10-CM | POA: Diagnosis present

## 2019-09-27 DIAGNOSIS — Z9221 Personal history of antineoplastic chemotherapy: Secondary | ICD-10-CM | POA: Diagnosis not present

## 2019-09-27 DIAGNOSIS — Z87891 Personal history of nicotine dependence: Secondary | ICD-10-CM | POA: Diagnosis not present

## 2019-09-27 DIAGNOSIS — M069 Rheumatoid arthritis, unspecified: Secondary | ICD-10-CM | POA: Diagnosis present

## 2019-09-27 DIAGNOSIS — Z20822 Contact with and (suspected) exposure to covid-19: Secondary | ICD-10-CM | POA: Diagnosis present

## 2019-09-27 LAB — BASIC METABOLIC PANEL
Anion gap: 10 (ref 5–15)
BUN: 18 mg/dL (ref 8–23)
CO2: 22 mmol/L (ref 22–32)
Calcium: 9.2 mg/dL (ref 8.9–10.3)
Chloride: 101 mmol/L (ref 98–111)
Creatinine, Ser: 1.09 mg/dL (ref 0.61–1.24)
GFR calc Af Amer: >60 mL/min (ref >60)
GFR calc non Af Amer: >60 mL/min (ref >60)
Glucose, Bld: 170 mg/dL — ABNORMAL HIGH (ref 70–99)
Potassium: 4.7 mmol/L (ref 3.5–5.1)
Sodium: 133 mmol/L — ABNORMAL LOW (ref 135–145)

## 2019-09-27 LAB — CBC
HCT: 41.6 % (ref 39.0–52.0)
Hemoglobin: 14.3 g/dL (ref 13.0–17.0)
MCH: 35.9 pg — ABNORMAL HIGH (ref 26.0–34.0)
MCHC: 34.4 g/dL (ref 30.0–36.0)
MCV: 104.5 fL — ABNORMAL HIGH (ref 80.0–100.0)
Platelets: 108 10*3/uL — ABNORMAL LOW (ref 150–400)
RBC: 3.98 MIL/uL — ABNORMAL LOW (ref 4.22–5.81)
RDW: 15.8 % — ABNORMAL HIGH (ref 11.5–15.5)
WBC: 3 10*3/uL — ABNORMAL LOW (ref 4.0–10.5)
nRBC: 0 % (ref 0.0–0.2)

## 2019-09-27 MED ORDER — TRAMADOL HCL 50 MG PO TABS
50.0000 mg | ORAL_TABLET | Freq: Two times a day (BID) | ORAL | Status: DC | PRN
  Administered 2019-09-27: 50 mg via ORAL
  Filled 2019-09-27: qty 1

## 2019-09-27 MED ORDER — ARFORMOTEROL TARTRATE 15 MCG/2ML IN NEBU
15.0000 ug | INHALATION_SOLUTION | Freq: Two times a day (BID) | RESPIRATORY_TRACT | Status: DC
  Administered 2019-09-27 – 2019-09-28 (×2): 15 ug via RESPIRATORY_TRACT
  Filled 2019-09-27 (×4): qty 2

## 2019-09-27 MED ORDER — UMECLIDINIUM BROMIDE 62.5 MCG/INH IN AEPB
1.0000 | INHALATION_SPRAY | Freq: Every day | RESPIRATORY_TRACT | Status: DC
  Filled 2019-09-27: qty 7

## 2019-09-27 MED ORDER — BUDESONIDE 0.5 MG/2ML IN SUSP
0.5000 mg | Freq: Two times a day (BID) | RESPIRATORY_TRACT | Status: DC
  Administered 2019-09-27 – 2019-09-28 (×3): 0.5 mg via RESPIRATORY_TRACT
  Filled 2019-09-27 (×3): qty 2

## 2019-09-27 MED ORDER — IPRATROPIUM-ALBUTEROL 0.5-2.5 (3) MG/3ML IN SOLN: 3.0000 mL | RESPIRATORY_TRACT | Status: DC | PRN

## 2019-09-27 MED ORDER — METHYLPREDNISOLONE SODIUM SUCC 125 MG IJ SOLR
60.0000 mg | Freq: Two times a day (BID) | INTRAMUSCULAR | Status: DC
  Administered 2019-09-27 – 2019-09-28 (×2): 60 mg via INTRAVENOUS
  Filled 2019-09-27 (×2): qty 2

## 2019-09-27 MED ORDER — ENSURE ENLIVE PO LIQD: 237.0000 mL | Freq: Two times a day (BID) | ORAL | Status: DC

## 2019-09-27 MED ORDER — CHLORHEXIDINE GLUCONATE CLOTH 2 % EX PADS: 6.0000 | MEDICATED_PAD | Freq: Every day | CUTANEOUS | Status: DC

## 2019-09-27 NOTE — Evaluation (Signed)
Physical Therapy Evaluation Patient Details Name: Darryl White MRN: [id number] DOB: 22-Jun-1940 Today's Date: 09/27/2019   History of Present Illness  Pt is a 79 y.o. male presenting to hospital 8/6 with SOB and hypoxia.  Pt admitted with acute respiratory failure with hypoxia secondary acute exacerbation of COPD and paroxysmal a-fib.  PMH includes lung CA, COPD, h/o hiatal hernia, orthostatic hypotension, a-fib, pacemaker, neutropenic fever, RA, h/o back surgery, porta cath insertion.  Clinical Impression  Prior to hospital admission, pt was independent with ambulation; no home O2; and lives with his wife in 1 level condo (level entry).  Currently pt is modified independent semi-supine to sitting edge of bed; SBA with transfers; and CGA ambulating 25 feet x2 (to bathroom and back).  Trialed pt on room air per discussion with pt's nurse.  Pt's O2 sats 95% on 3 L O2 at rest beginning of session; 89% at rest on room air resting in bed; 75% on room air after transferring bed to recliner (so pt placed back on 3 L O2 via nasal cannula and O2 sats returned to 95%); 86% on 3 L after toileting and washing hands at sink; 85% on 3 L after walking from bathroom back to recliner; and 93% on 3 L at rest in recliner end of session (nurse notified of above O2 sats during session).  PT saw pt at [place] but after therapy session PT order was noted to have been cancelled at 0839 (during time therapist was off computer getting chair set-up and finding/talking to pt's nurse in preparation to see pt): MD [place] notified that pt was seen for PT evaluation (since it was ordered as an imminent discharge).  Anticipate pt may benefit from OP pulmonary rehab program.  Since no active PT order currently, will sign off (MD notified).    Follow Up Recommendations Other (comment) (May benefit from OP pulmonary rehab)    Equipment Recommendations  None recommended by PT    Recommendations for Other Services       Precautions /  Restrictions Precautions Precautions: Fall Precaution Comments: Monitor O2; R chest port; pacemaker Restrictions Weight Bearing Restrictions: No      Mobility  Bed Mobility Overal bed mobility: Modified Independent             General bed mobility comments: Semi-supine to sitting edge of bed without any noted difficulties  Transfers Overall transfer level: Needs assistance Equipment used: None Transfers: Sit to/from Stand;Stand Pivot Transfers Sit to Stand:  (fairly strong stand from bed and from recliner) Stand pivot transfers: Supervision (stand step turn bed to recliner no AD)       General transfer comment: steady safe transfers noted; SOB noted  Ambulation/Gait Ambulation/Gait assistance: Min guard Gait Distance (Feet):  (25 feet x2 (to bathroom and back)) Assistive device: None   Gait velocity: decreased   General Gait Details: pt initially steady ambulating to bathroom but pt becoming more SOB ambulating back from bathroom and occasionally holding onto furniture to steady self  Stairs            Wheelchair Mobility    Modified Rankin (Stroke Patients Only)       Balance Overall balance assessment: Needs assistance Sitting-balance support: No upper extremity supported;Feet supported Sitting balance-Leahy Scale: Normal Sitting balance - Comments: steady sitting reaching outside BOS   Standing balance support: No upper extremity supported Standing balance-Leahy Scale: Good Standing balance comment: steady standing washing hands at sink  Pertinent Vitals/Pain Pain Assessment: No/denies pain    Home Living Family/patient expects to be discharged to:: Private residence Living Arrangements: Spouse/significant other Available Help at Discharge: Family Type of Home:  (condo) Home Access: Level entry     Home Layout: One level Home Equipment: Grab bars - tub/shower;Grab bars - toilet;Shower seat - built  in;Walker - 2 wheels;Cane - single point      Prior Function Level of Independence: Independent         Comments: Pt reports no home O2 and no falls in past 6 months.     Hand Dominance        Extremity/Trunk Assessment   Upper Extremity Assessment Upper Extremity Assessment: Generalized weakness    Lower Extremity Assessment Lower Extremity Assessment: Generalized weakness    Cervical / Trunk Assessment Cervical / Trunk Assessment: Normal  Communication   Communication: No difficulties  Cognition Arousal/Alertness: Awake/alert Behavior During Therapy: WFL for tasks assessed/performed Overall Cognitive Status: Within Functional Limits for tasks assessed                                        General Comments   Nursing cleared pt for participation in physical therapy.  Pt agreeable to PT session.    Exercises  Purse lip breathing; activity pacing/modification   Assessment/Plan    PT Assessment Patient needs continued PT services  PT Problem List Decreased strength;Decreased activity tolerance;Decreased balance;Decreased mobility;Cardiopulmonary status limiting activity       PT Treatment Interventions DME instruction;Gait training;Functional mobility training;Therapeutic activities;Therapeutic exercise;Balance training;Patient/family education    PT Goals (Current goals can be found in the Care Plan section)  Acute Rehab PT Goals Patient Stated Goal: to improve breathing PT Goal Formulation: With patient Time For Goal Achievement: 10/11/19 Potential to Achieve Goals: Fair    Frequency Min 2X/week   Barriers to discharge        Co-evaluation               AM-PAC PT "6 Clicks" Mobility  Outcome Measure Help needed turning from your back to your side while in a flat bed without using bedrails?: None Help needed moving from lying on your back to sitting on the side of a flat bed without using bedrails?: None Help needed moving to  and from a bed to a chair (including a wheelchair)?: A Little Help needed standing up from a chair using your arms (e.g., wheelchair or bedside chair)?: A Little Help needed to walk in hospital room?: A Little Help needed climbing 3-5 steps with a railing? : A Little 6 Click Score: 20    End of Session Equipment Utilized During Treatment: Gait belt;Oxygen Activity Tolerance: Other (comment) (Limited d/t SOB and decreased O2 sats) Patient left: in chair;with call bell/phone within reach;with chair alarm set Nurse Communication: Mobility status;Precautions;Other (comment) (pt's O2 sats during session) PT Visit Diagnosis: Other abnormalities of gait and mobility (R26.89);Muscle weakness (generalized) (M62.81)    Time: 8413-2440 PT Time Calculation (min) (ACUTE ONLY): 27 min   Charges:   PT Evaluation $PT Eval Low Complexity: 1 Low PT Treatments $Therapeutic Activity: 8-22 mins       Leitha Bleak, PT 09/27/19, 10:07 AM

## 2019-09-27 NOTE — Progress Notes (Signed)
Pt pain 9/10, RN gave tramadol. Pt denies any further needs at this time. Call bell within reach. RN check spo2 98% on 4.5 L [place]. RN titrated down to 4 L [place]. Pt denies SOB. SpO2 97% on 4 L [place] after 14mins.. Call bell within reach, RN number on pt's board.

## 2019-09-27 NOTE — Progress Notes (Signed)
PROGRESS NOTE  KAIKOA MAGRO POE:[id number] DOB: Feb 05, 1941   PCP: Pleas Koch, NP  Patient is from: Home.  Independently ambulates at baseline.  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 fib on Eliquis, RA on methotrexate and hyperlipidemia presenting with shortness of breath, cough, DOE and hypoxemia to 70's.  He was admitted for acute respiratory failure with hypoxia due to COPD exacerbation.  In ED, desaturated to 86% on RA requiring 3 L.  CBC and CMP without significant finding.  COVID-19 PCR negative.  CXR without acute finding but severe bullous COPD and chronic interstitial changes.  Patient was started on steroid, azithromycin and breathing treatments and admitted.  Subjective: Seen and examined earlier this morning.  No major events overnight.  Feels better.  Reports improvement in his breathing.  Still with some productive cough.  He denies chest pain, orthopnea, PND or edema.   Patient was ambulated by PT and desaturated to 75% on RA.   Objective: Vitals:   09/27/19 0241 09/27/19 0243 09/27/19 0730 09/27/19 0748  BP: 110/76 105/77 109/77   Pulse: (!) 110 (!) 108 86   Resp:  18 16   Temp: (!) 97.4 F (36.3 C) (!) 97.5 F (36.4 C) 98.6 F (37 C)   TempSrc: Oral Oral Oral   SpO2: 95% 95% 98% 96%  Weight:      Height:        Intake/Output Summary (Last 24 hours) at 09/27/2019 1153 Last data filed at 09/27/2019 1022 Gross per 24 hour  Intake 610 ml  Output --  Net 610 ml   Filed Weights   09/26/19 1107  Weight: 74.9 kg    Examination:  GENERAL: No apparent distress.  Nontoxic. HEENT: MMM.  Vision and hearing grossly intact.  NECK: Supple.  No apparent JVD.  RESP: 93% on 3 L.  No IWOB.  Fair aeration bilaterally.  Some rhonchi. CVS:  RRR. Heart sounds normal.  ABD/GI/GU: BS+. Abd soft, NTND.  MSK/EXT:  Moves  extremities. No apparent deformity. No edema.  SKIN: no apparent skin lesion or wound NEURO: Awake, alert and oriented appropriately.  No apparent focal neuro deficit. PSYCH: Calm. Normal affect.   Procedures:  None  Microbiology summarized: COVID-19 PCR negative.  Assessment & Plan: Acute respiratory failure with hypoxia secondary to acute exacerbation of COPD: Reports improvement in his breathing but desaturated to 70% with ambulation on RA.  Patient is not on oxygen at home. -Increase IV Solu-Medrol to 60 mg twice daily -Continue azithromycin -Add desonide and Brovana -Change DuoNeb to as needed -Wean oxygen.  Minimum oxygen to keep saturation above 88%.  Notified RN. -Continue scheduled duo nebs and as needed albuterol  Paroxysmal atrial fibrillation: In sinus rhythm.  CHA2DS2-VASc score of 2.  Does not seem to be aware of rhythm control medication. 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 Tasia Catchings in radiation oncology Dr. Baruch Gouty.  S/p concurrent chemoradiation-carboplatin and etoposide. 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  Hyperlipidemia: -Continue fenofibrate.  Rheumatoid arthritis: On methotrexate 12.5 mg every Sunday.  -Continue holding home methotrexate given concern about possible active interstitial process/pneumonitis -Continue folic acid  Chronic pain -Continue home tramadol with as needed Tylenol  Body mass index is 26.65 kg/m.  DVT prophylaxis:   apixaban (ELIQUIS) tablet 5 mg  Code Status: Full code Family Communication: None at bedside. Status is: Inpatient  Remains inpatient appropriate because:IV treatments appropriate due to intensity of illness or inability to take PO and Inpatient level of care appropriate due to severity of illness   Dispo: The patient is from: Home              Anticipated d/c is to: Home               Anticipated d/c date is: 2 days              Patient currently is not medically stable to d/c.       Consultants:  None   Sch Meds:  Scheduled Meds:  apixaban  5 mg Oral BID   arformoterol  15 mcg Nebulization BID   azithromycin  250 mg Oral Daily   brimonidine  1 drop Both Eyes BID   budesonide (PULMICORT) nebulizer solution  0.5 mg Nebulization BID   chlorproMAZINE  25 mg Oral TID   famotidine  20 mg Oral BID   fenofibrate  160 mg Oral Daily   folic acid  1 mg Oral Daily   gabapentin  100 mg Oral TID   guaiFENesin  600 mg Oral BID   methylPREDNISolone (SOLU-MEDROL) injection  60 mg Intravenous Q12H   umeclidinium bromide  1 puff Inhalation Daily   Continuous Infusions: PRN Meds:.acetaminophen **OR** acetaminophen, ipratropium-albuterol, ondansetron **OR** ondansetron (ZOFRAN) IV, sodium chloride, traMADol  Antimicrobials: Anti-infectives (From admission, onward)   Start     Dose/Rate Route Frequency Ordered Stop   09/27/19 1000  azithromycin (ZITHROMAX) tablet 250 mg     Discontinue     250 mg Oral Daily 09/26/19 2245 10/01/19 0959   09/26/19 2115  azithromycin (ZITHROMAX) 500 mg in sodium chloride 0.9 % 250 mL IVPB        500 mg 250 mL/hr over 60 Minutes Intravenous  Once 09/26/19 2106 09/26/19 2259       I have personally reviewed the following labs and images: CBC: Recent Labs  Lab 09/26/19 1118 09/27/19 0634  WBC 5.0 3.0*  NEUTROABS 3.7  --   HGB 15.4 14.3  HCT 43.9 41.6  MCV 102.6* 104.5*  PLT 132* 108*   BMP &GFR Recent Labs  Lab 09/26/19 1118 09/27/19 0634  NA 135 133*  K 4.9 4.7  CL 100 101  CO2 24 22  GLUCOSE 107* 170*  BUN 19 18  CREATININE 1.08 1.09  CALCIUM 9.6 9.2   Estimated Creatinine Clearance: 50.4 mL/min (by C-G formula based on SCr of 1.09 mg/dL). Liver & Pancreas: Recent Labs  Lab 09/26/19 1118  AST 40  ALT 21  ALKPHOS 95  BILITOT 1.2  PROT 7.6  ALBUMIN 4.1   No results for input(s): LIPASE, AMYLASE  in the last 168 hours. No results for input(s): AMMONIA in the last 168 hours. Diabetic: No results for input(s): HGBA1C in the last 72 hours. No results for input(s): GLUCAP in the last 168 hours. Cardiac Enzymes: No results for input(s): CKTOTAL, CKMB, CKMBINDEX, TROPONINI in the last 168 hours. No results for input(s): PROBNP in the last 8760 hours. Coagulation Profile: No results for input(s): INR, PROTIME in the last 168 hours. Thyroid Function Tests: No results for input(s): TSH, T4TOTAL, FREET4, T3FREE, THYROIDAB in the last 72 hours. Lipid Profile: No results for input(s): CHOL, HDL, LDLCALC, TRIG, CHOLHDL, LDLDIRECT in the last 72 hours. Anemia Panel:  No results for input(s): VITAMINB12, FOLATE, FERRITIN, TIBC, IRON, RETICCTPCT in the last 72 hours. Urine analysis:    Component Value Date/Time   COLORURINE YELLOW (A) 05/27/2019 1205   APPEARANCEUR HAZY (A) 05/27/2019 1205   LABSPEC 1.023 05/27/2019 1205   PHURINE 6.0 05/27/2019 1205   [place] 05/27/2019 1205   HGBUR NEGATIVE 05/27/2019 1205   [place] 05/27/2019 [place] 05/27/2019 1205   PROTEINUR NEGATIVE 05/27/2019 1205   UROBILINOGEN 0.2 10/12/2014 1919   NITRITE NEGATIVE 05/27/2019 1205   LEUKOCYTESUR NEGATIVE 05/27/2019 1205   Sepsis Labs: Invalid input(s): PROCALCITONIN, [place]  Microbiology: Recent Results (from the past 240 hour(s))  SARS Coronavirus 2 by RT PCR (hospital order, performed in [facility] hospital lab) Nasopharyngeal Nasopharyngeal Swab     Status: None   Collection Time: 09/26/19  8:10 PM   Specimen: Nasopharyngeal Swab  Result Value Ref Range Status   SARS Coronavirus 2 NEGATIVE NEGATIVE Final    Comment: (NOTE) SARS-CoV-2 target nucleic acids are NOT DETECTED.  The SARS-CoV-2 RNA is generally detectable in upper and lower respiratory specimens during the acute phase of infection. The lowest concentration of SARS-CoV-2 viral copies this assay  can detect is 250 copies / mL. A negative result does not preclude SARS-CoV-2 infection and should not be used as the sole basis for treatment or other patient management decisions.  A negative result may occur with improper specimen collection / handling, submission of specimen other than nasopharyngeal swab, presence of viral mutation(s) within the areas targeted by this assay, and inadequate number of viral copies (<250 copies / mL). A negative result must be combined with clinical observations, patient history, and epidemiological information.  Fact Sheet for Patients:   StrictlyIdeas.no  Fact Sheet for Healthcare Providers: BankingDealers.co.za  This test is not yet approved or  cleared by the [place] FDA and has been authorized for detection and/or diagnosis of SARS-CoV-2 by FDA under an Emergency Use Authorization (EUA).  This EUA will remain in effect (meaning this test can be used) for the duration of the COVID-19 declaration under Section 564(b)(1) of the Act, 21 U.S.C. section 360bbb-3(b)(1), unless the authorization is terminated or revoked sooner.  Performed at [facility], [address], [place], [place] [address]     Radiology Studies: No results found.    Dhani Imel T. [place]  If 7PM-7AM, please contact night-coverage [email or web address] Password [facility] 09/27/2019, 11:53 AM

## 2019-09-27 NOTE — Progress Notes (Signed)
Initial Nutrition Assessment  RD working remotely.  DOCUMENTATION CODES:   Not applicable  INTERVENTION:  Provide Ensure Enlive po BID, each supplement provides 350 kcal and 20 grams of protein.  NUTRITION DIAGNOSIS:   Increased nutrient needs related to catabolic illness (COPD, small cell lung cancer s/p concurrent chemoradiation) as evidenced by estimated needs.  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   Spoke with patient over the phone. Patient reports he has had weight loss related to his lung cancer and treatment. He reports he had approximately 36 weeks of treatment and has now been finished for several months. During that time he lost approximately 12-15 lbs. His UBW was 172 lbs and he lost down to around 160 lbs and is trying to gain weight back. Per review of weight history patient was 172.6 lbs on 03/31/2019. He is now documented to be 74.9 kg (165.12 lbs). That is a weight loss of 4.3% body weight) over 6 months, which is not significant for time frame. However, weights in chart fluctuate significantly so it is difficult to trend.  Patient reports his appetite has remains good and he eats well at meals. Patient is very knowledgeable about his high calorie/protein needs and asked MD to liberalize his diet to regular today. At home patient eats 3 meals per day. He has a light breakfast of cereal and Activia. He then eats a good lunch and dinner. He drinks high-calorie, high-protein Boost at home. Patient is amenable to drinking Ensure Enlive here to  help meet calorie/protein needs. According to chart patient ate 95% of his breakfast this morning. He was working on his lunch tray at time of RD assessment.  Medications reviewed and include: azithromycin, famotidine, fenofibrate 272 mg daily, folic acid 1 mg daily, gabapentin, Solu-Medrol 60 mg Q12hrs IV.  Labs reviewed: Sodium 133.  Unable to determine if patient meets criteria for malnutrition at this time without completing Nutrition-Focused Physical Exam.  NUTRITION - FOCUSED PHYSICAL EXAM:  Unable to complete as RD is working remotely.  Diet Order:   Diet Order            Diet regular Room service appropriate? Yes; Fluid consistency: Thin  Diet effective now                 EDUCATION NEEDS:   No education needs have been identified at this time  Skin:  Skin Assessment: Reviewed RN Assessment  Last BM:  09/26/2019  Height:   Ht Readings from Last 1 Encounters:  09/26/19 5\' 6"  (1.676 m)   Weight:   Wt Readings from Last 1 Encounters:  09/26/19 74.9 kg   BMI:  Body mass index is 26.65 kg/m.  Estimated Nutritional Needs:   Kcal:  2100-2300  Protein:  105-115 grams  Fluid:  1.8-2.2 L/day  Jacklynn Barnacle, MS, RD, LDN Pager number available on Amion

## 2019-09-27 NOTE — Progress Notes (Signed)
SATURATION QUALIFICATIONS: (This note is used to comply with regulatory documentation for home oxygen)  Patient Saturations on Room Air at Rest = 75 Patient Saturations on Room Air while Ambulating = 85

## 2019-09-28 DIAGNOSIS — E785 Hyperlipidemia, unspecified: Secondary | ICD-10-CM

## 2019-09-28 DIAGNOSIS — C3491 Malignant neoplasm of unspecified part of right bronchus or lung: Secondary | ICD-10-CM

## 2019-09-28 LAB — CBC
HCT: 38.4 % — ABNORMAL LOW (ref 39.0–52.0)
Hemoglobin: 13.2 g/dL (ref 13.0–17.0)
MCH: 35.8 pg — ABNORMAL HIGH (ref 26.0–34.0)
MCHC: 34.4 g/dL (ref 30.0–36.0)
MCV: 104.1 fL — ABNORMAL HIGH (ref 80.0–100.0)
Platelets: 109 10*3/uL — ABNORMAL LOW (ref 150–400)
RBC: 3.69 MIL/uL — ABNORMAL LOW (ref 4.22–5.81)
RDW: 16.1 % — ABNORMAL HIGH (ref 11.5–15.5)
WBC: 4.7 10*3/uL (ref 4.0–10.5)
nRBC: 0 % (ref 0.0–0.2)

## 2019-09-28 LAB — VITAMIN B12: Vitamin B-12: 306 pg/mL (ref 180–914)

## 2019-09-28 LAB — RENAL FUNCTION PANEL
Albumin: 3.4 g/dL — ABNORMAL LOW (ref 3.5–5.0)
Anion gap: 8 (ref 5–15)
BUN: 25 mg/dL — ABNORMAL HIGH (ref 8–23)
CO2: 25 mmol/L (ref 22–32)
Calcium: 9.1 mg/dL (ref 8.9–10.3)
Chloride: 103 mmol/L (ref 98–111)
Creatinine, Ser: 1.06 mg/dL (ref 0.61–1.24)
GFR calc Af Amer: >60 mL/min (ref >60)
GFR calc non Af Amer: >60 mL/min (ref >60)
Glucose, Bld: 148 mg/dL — ABNORMAL HIGH (ref 70–99)
Phosphorus: 3.6 mg/dL (ref 2.5–4.6)
Potassium: 5.3 mmol/L — ABNORMAL HIGH (ref 3.5–5.1)
Sodium: 136 mmol/L (ref 135–145)

## 2019-09-28 LAB — FERRITIN: Ferritin: 216 ng/mL (ref 24–336)

## 2019-09-28 LAB — RETICULOCYTES
Immature Retic Fract: 10.2 % (ref 2.3–15.9)
RBC.: 3.74 MIL/uL — ABNORMAL LOW (ref 4.22–5.81)
Retic Count, Absolute: 85.6 10*3/uL (ref 19.0–186.0)
Retic Ct Pct: 2.3 % (ref 0.4–3.1)

## 2019-09-28 LAB — IRON AND TIBC
Iron: 80 ug/dL (ref 45–182)
Saturation Ratios: 24 % (ref 17.9–39.5)
TIBC: 339 ug/dL (ref 250–450)
UIBC: 259 ug/dL

## 2019-09-28 LAB — FOLATE: Folate: 18 ng/mL (ref >5.9)

## 2019-09-28 LAB — MAGNESIUM: Magnesium: 1.9 mg/dL (ref 1.7–2.4)

## 2019-09-28 MED ORDER — INCRUSE ELLIPTA 62.5 MCG/INH IN AEPB
1.0000 | INHALATION_SPRAY | Freq: Every day | RESPIRATORY_TRACT | 1 refills | Status: DC
Start: 2019-09-28 — End: 2019-09-28

## 2019-09-28 MED ORDER — PREDNISONE 50 MG PO TABS
50.0000 mg | ORAL_TABLET | Freq: Every day | ORAL | 0 refills | Status: DC
Start: 2019-09-28 — End: 2019-10-07

## 2019-09-28 MED ORDER — AZITHROMYCIN 250 MG PO TABS: 250.0000 mg | ORAL_TABLET | Freq: Every day | ORAL | 0 refills | Status: DC

## 2019-09-28 NOTE — Plan of Care (Signed)
Pt weaned to 3.5L O2 this shift, maintaining sat above 92. Problem: Education: Goal: Knowledge of disease or condition will improve Outcome: Progressing Goal: Knowledge of the prescribed therapeutic regimen will improve Outcome: Progressing Goal: Individualized Educational Video(s) Outcome: Progressing   Problem: Activity: Goal: Ability to tolerate increased activity will improve Outcome: Progressing Goal: Will verbalize the importance of balancing activity with adequate rest periods Outcome: Progressing   Problem: Respiratory: Goal: Ability to maintain a clear airway will improve Outcome: Progressing Goal: Levels of oxygenation will improve Outcome: Progressing Goal: Ability to maintain adequate ventilation will improve Outcome: Progressing   Problem: Education: Goal: Knowledge of General Education information will improve Description: Including pain rating scale, medication(s)/side effects and non-pharmacologic comfort measures Outcome: Progressing   Problem: Health Behavior/Discharge Planning: Goal: Ability to manage health-related needs will improve Outcome: Progressing   Problem: Clinical Measurements: Goal: Ability to maintain clinical measurements within normal limits will improve Outcome: Progressing Goal: Will remain free from infection Outcome: Progressing Goal: Diagnostic test results will improve Outcome: Progressing Goal: Respiratory complications will improve Outcome: Progressing Goal: Cardiovascular complication will be avoided Outcome: Progressing   Problem: Activity: Goal: Risk for activity intolerance will decrease Outcome: Progressing   Problem: Nutrition: Goal: Adequate nutrition will be maintained Outcome: Progressing   Problem: Coping: Goal: Level of anxiety will decrease Outcome: Progressing   Problem: Elimination: Goal: Will not experience complications related to bowel motility Outcome: Progressing Goal: Will not experience  complications related to urinary retention Outcome: Progressing   Problem: Pain Managment: Goal: General experience of comfort will improve Outcome: Progressing   Problem: Safety: Goal: Ability to remain free from injury will improve Outcome: Progressing   Problem: Skin Integrity: Goal: Risk for impaired skin integrity will decrease Outcome: Progressing   Problem: Education: Goal: Knowledge of disease or condition will improve Outcome: Progressing Goal: Knowledge of the prescribed therapeutic regimen will improve Outcome: Progressing Goal: Individualized Educational Video(s) Outcome: Progressing   Problem: Activity: Goal: Ability to tolerate increased activity will improve Outcome: Progressing Goal: Will verbalize the importance of balancing activity with adequate rest periods Outcome: Progressing   Problem: Respiratory: Goal: Ability to maintain a clear airway will improve Outcome: Progressing Goal: Levels of oxygenation will improve Outcome: Progressing Goal: Ability to maintain adequate ventilation will improve Outcome: Progressing

## 2019-09-28 NOTE — Discharge Summary (Signed)
Physician Discharge Summary  Darryl White JJK:[id number] DOB: 23-Jan-1941 DOA: 09/26/2019  PCP: Pleas Koch, NP  Admit date: 09/26/2019 Discharge date: 09/28/2019  Admitted From: Home. Disposition: Home  Recommendations for Outpatient Follow-up:  1. Follow up with PCP and pulmonology in 1 to 2 weeks 2. Please obtain CBC/BMP/Mag at follow up 3. Please follow up on the following pending results: None  Home Health: None Equipment/Devices: Home oxygen  Discharge Condition: Stable CODE STATUS: Full code   Follow-up Information    Pleas Koch, NP. Schedule an appointment as soon as possible for a visit in 1 week(s).   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 fib on Eliquis, RA on methotrexate and hyperlipidemia presenting with shortness of breath, cough, DOE and hypoxemia to 70's.  He was admitted for acute respiratory failure with hypoxia due to COPD exacerbation.  In ED, desaturated to 86% on RA requiring 3 L.  CBC and CMP without significant finding.  COVID-19 PCR negative.  CXR without acute finding but severe bullous COPD and chronic interstitial changes.  Patient was started on steroid, azithromycin and breathing treatments and admitted.  On the day of discharge, patient felt well from breathing standpoint.  He felt ready and eager to go home.  However, he desaturated to 70s with ambulation on room air but recovered to 92% on 3 L by nasal cannula. It felt unlikely that patient could come off oxygen completely given his advanced COPD.  So he was discharged on 3 L by nasal cannula.  He is already on ICS/LABA/LAMA and SABA and SAMA. He will be discharged on prednisone and azithromycin to complete course for acute exacerbation.  See individual problem  list below for more hospital course.  Discharge Diagnoses:  Acute respiratory failure with hypoxia secondary to acute exacerbation of COPD: Reports improvement and eager to go home. -Prednisone 50 mg daily and azithromycin to complete course for acute exacerbation -Continue home Breztri Aerosphere and PRN DuoNeb -Outpatient follow-up with his pulmonologist in 1 to 2 weeks -methotrexate could contribute to his possible active interstitial process/pneumonitis  Paroxysmal atrial fibrillation: In sinus rhythm.  CHA2DS2-VASc score of 2.  Does not seem to be aware of rhythm control medication. 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 Tasia Catchings in radiation oncology Dr. Baruch Gouty. S/p concurrent chemoradiation-carboplatin and etoposide. 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  Hyperlipidemia: -Continue fenofibrate.  Rheumatoid arthritis: On methotrexate 12.5 mg every Sunday.  -Continue home methotrexate but could contribute to his possible active interstitial process/pneumonitis -Continue folic acid  Chronic pain -Continue home tramadol with as needed Tylenol   Body mass index is 26.65 kg/m. Nutrition Problem: Increased nutrient needs Etiology: catabolic illness (COPD, small cell lung cancer s/p concurrent chemoradiation) Signs/Symptoms: estimated needs Interventions: Ensure Enlive (each supplement provides 350kcal and 20 grams of protein)      Discharge Exam: Vitals:   09/28/19 0748 09/28/19 0914  BP: 116/77   Pulse: 83   Resp: 17   Temp: (!) 97.3 F (36.3 C)   SpO2: 94% 93%    GENERAL: No apparent distress.  Nontoxic. HEENT: MMM.  Vision and hearing grossly intact.  NECK: Supple.  No apparent JVD.  RESP: 93% on 2L at rest. No IWOB.  Fair aeration bilaterally. CVS:  RRR. Heart sounds normal.  ABD/GI/GU: Bowel sounds present. Soft. Non tender.  MSK/EXT:  Moves  extremities. No apparent deformity. No edema.  SKIN: no apparent skin lesion or wound NEURO: Awake, alert and oriented appropriately.  No apparent focal neuro deficit. PSYCH: Calm. Normal affect.  Discharge Instructions  Discharge Instructions    Call MD for:  difficulty breathing, headache or visual disturbances   Complete by: As directed    Call MD for:  extreme fatigue   Complete by: As directed    Diet general   Complete by: As directed    Discharge instructions   Complete by: As directed    It has been a pleasure taking care of you!  You were hospitalized and treated for COPD exacerbation.  We are discharging you on prednisone and antibiotics to complete treatment course for acute exacerbation of your COPD.  We have also added additional breathing treatment.  Please follow-up with your pulmonologist in 1 to 2 weeks.   We may have started you on other new medications or made some changes to your home medications during this hospitalization. Please review your new medication list and the directions carefully before you take them.    Please go to your hospital follow-up appointments or call to reschedule as recommended.   Take care,   Increase activity slowly   Complete by: As directed      Allergies as of 09/28/2019      Reactions   Sulfa Antibiotics Hives, Itching   Penicillins Hives, Palpitations   Did it involve swelling of the face/tongue/throat, SOB, or low BP? No Did it involve sudden or severe rash/hives, skin peeling, or any reaction on the inside of your mouth or nose? No Did you need to seek medical attention at a hospital or doctor's office? Unknown When did it last happen?childhood allergy If all above answers are "NO", may proceed with cephalosporin use.      Medication List    TAKE these medications   Acetaminophen 8 Hour 650 MG CR tablet Generic drug: acetaminophen Take 650 mg by mouth every 8 (eight) hours as needed for pain.   albuterol 108  (90 Base) MCG/ACT inhaler Commonly known as: ProAir HFA Inhale 1-2 puffs into the lungs every 6 (six) hours as needed for wheezing or shortness of breath.   azithromycin 250 MG tablet Commonly known as: ZITHROMAX Take 1 tablet (250 mg total) by mouth daily.   Breztri Aerosphere 160-9-4.8 MCG/ACT Aero Generic drug: Budeson-Glycopyrrol-Formoterol Inhale 2 puffs into the lungs 2 (two) times daily.   brimonidine 0.15 % ophthalmic solution Commonly known as: ALPHAGAN Place 1 drop into both eyes 2 (two) times daily.   Eliquis 5 MG Tabs tablet Generic drug: apixaban TAKE 1 TABLET BY MOUTH TWICE DAILY   famotidine 20 MG tablet Commonly known as: PEPCID Take 20 mg by mouth 2 (two) times daily.   fenofibrate 145 MG tablet Commonly known as: TRICOR TAKE 1 TABLET(145 MG) BY MOUTH DAILY   folic acid 1 MG tablet Commonly known as: FOLVITE Take 1 mg by mouth daily.   gabapentin 100 MG capsule Commonly known as: NEURONTIN Take 100 mg by mouth 3 (three) times daily.   lidocaine-prilocaine cream Commonly known as: EMLA Apply to affected area once   methotrexate 2.5 MG tablet Take 12.5 mg by mouth every Sunday.   ondansetron 8 MG tablet Commonly known as: Zofran  Take 1 tablet (8 mg total) by mouth 2 (two) times daily as needed for refractory nausea / vomiting. Start on day 3 after carboplatin chemo.   predniSONE 50 MG tablet Commonly known as: DELTASONE Take 1 tablet (50 mg total) by mouth daily.   sodium chloride 0.65 % Soln nasal spray Commonly known as: OCEAN Place 1 spray into both nostrils at bedtime as needed for congestion.   traMADol 50 MG tablet Commonly known as: ULTRAM Take 25 mg by mouth at bedtime as needed for moderate pain.   Vitamin D3 50 MCG (2000 UT) capsule Take 1 capsule (2,000 Units total) by mouth daily with lunch.            Durable Medical Equipment  (From admission, onward)         Start     Ordered   09/28/19 1143  For home use only DME  oxygen  Once       Question Answer Comment  Length of Need Lifetime   Liters per Minute 3   Frequency Continuous (stationary and portable oxygen unit needed)   Oxygen delivery system Gas      09/28/19 1143          Consultations:  None  Procedures/Studies:   DG Chest 2 View  Result Date: 09/26/2019 CLINICAL DATA:  Shortness of breath. EXAM: CHEST - 2 VIEW COMPARISON:  CT 07/23/2019, 03/30/2017.  Chest x-ray 05/27/2019. FINDINGS: PowerPort catheter in stable position. Cardiac pacer stable position. Heart size stable. Severe changes of bullous COPD again noted. Chronic interstitial changes are again noted. Interstitial prominence slightly greater on today's exam. An active interstitial process such as pneumonitis cannot be excluded. No pleural effusion or pneumothorax. IMPRESSION: 1. PowerPort catheter stable position. Cardiac pacer stable position. Heart size stable. 2. Severe changes of bullous COPD again noted. Chronic interstitial changes again noted. Interstitial prominence slightly greater on today's exam. Active interstitial process such as pneumonitis cannot be excluded. Electronically Signed   By: Marcello Moores  Register   On: 09/26/2019 11:35       The results of significant diagnostics from this hospitalization (including imaging, microbiology, ancillary and laboratory) are listed below for reference.     Microbiology: Recent Results (from the past 240 hour(s))  SARS Coronavirus 2 by RT PCR (hospital order, performed in [facility] hospital lab) Nasopharyngeal Nasopharyngeal Swab     Status: None   Collection Time: 09/26/19  8:10 PM   Specimen: Nasopharyngeal Swab  Result Value Ref Range Status   SARS Coronavirus 2 NEGATIVE NEGATIVE Final    Comment: (NOTE) SARS-CoV-2 target nucleic acids are NOT DETECTED.  The SARS-CoV-2 RNA is generally detectable in upper and lower respiratory specimens during the acute phase of infection. The lowest concentration of SARS-CoV-2 viral  copies this assay can detect is 250 copies / mL. A negative result does not preclude SARS-CoV-2 infection and should not be used as the sole basis for treatment or other patient management decisions.  A negative result may occur with improper specimen collection / handling, submission of specimen other than nasopharyngeal swab, presence of viral mutation(s) within the areas targeted by this assay, and inadequate number of viral copies (<250 copies / mL). A negative result must be combined with clinical observations, patient history, and epidemiological information.  Fact Sheet for Patients:   StrictlyIdeas.no  Fact Sheet for Healthcare Providers: BankingDealers.co.za  This test is not yet approved or  cleared by the [place] FDA and has been authorized for detection and/or diagnosis  of SARS-CoV-2 by FDA under an Emergency Use Authorization (EUA).  This EUA will remain in effect (meaning this test can be used) for the duration of the COVID-19 declaration under Section 564(b)(1) of the Act, 21 U.S.C. section 360bbb-3(b)(1), unless the authorization is terminated or revoked sooner.  Performed at [facility], [address] [place], [place] [address]      Labs: BNP (last 3 results) No results for input(s): BNP in the last 8760 hours. Basic Metabolic Panel: Recent Labs  Lab 09/26/19 1118 09/27/19 0634 09/28/19 0440  NA 135 133* 136  K 4.9 4.7 5.3*  CL 100 101 103  CO2 24 22 25   GLUCOSE 107* 170* 148*  BUN 19 18 25*  CREATININE 1.08 1.09 1.06  CALCIUM 9.6 9.2 9.1  MG  --   --  1.9  PHOS  --   --  3.6   Liver Function Tests: Recent Labs  Lab 09/26/19 1118 09/28/19 0440  AST 40  --   ALT 21  --   ALKPHOS 95  --   BILITOT 1.2  --   PROT 7.6  --   ALBUMIN 4.1 3.4*   No results for input(s): LIPASE, AMYLASE in the last 168 hours. No results for input(s): AMMONIA in the last 168 hours. CBC: Recent Labs    Lab 09/26/19 1118 09/27/19 0634 09/28/19 0440  WBC 5.0 3.0* 4.7  NEUTROABS 3.7  --   --   HGB 15.4 14.3 13.2  HCT 43.9 41.6 38.4*  MCV 102.6* 104.5* 104.1*  PLT 132* 108* 109*   Cardiac Enzymes: No results for input(s): CKTOTAL, CKMB, CKMBINDEX, TROPONINI in the last 168 hours. BNP: Invalid input(s): POCBNP CBG: No results for input(s): GLUCAP in the last 168 hours. D-Dimer No results for input(s): DDIMER in the last 72 hours. Hgb A1c No results for input(s): HGBA1C in the last 72 hours. Lipid Profile No results for input(s): CHOL, HDL, LDLCALC, TRIG, CHOLHDL, LDLDIRECT in the last 72 hours. Thyroid function studies No results for input(s): TSH, T4TOTAL, T3FREE, THYROIDAB in the last 72 hours.  Invalid input(s): FREET3 Anemia work up Recent Labs    09/28/19 0440  VITAMINB12 306  FOLATE 18.0  FERRITIN 216  TIBC 339  IRON 80  RETICCTPCT 2.3   Urinalysis    Component Value Date/Time   COLORURINE YELLOW (A) 05/27/2019 1205   APPEARANCEUR HAZY (A) 05/27/2019 1205   LABSPEC 1.023 05/27/2019 1205   PHURINE 6.0 05/27/2019 1205   [place] 05/27/2019 1205   [place] 05/27/2019 1205   [place] 05/27/2019 1205   [place] 05/27/2019 1205   PROTEINUR NEGATIVE 05/27/2019 1205   UROBILINOGEN 0.2 10/12/2014 1919   NITRITE NEGATIVE 05/27/2019 1205   LEUKOCYTESUR NEGATIVE 05/27/2019 1205   Sepsis Labs Invalid input(s): PROCALCITONIN,  WBC,  LACTICIDVEN   Time coordinating discharge: 45 minutes  SIGNED:  Mercy Riding, MD  [facility] 09/28/2019, 3:44 PM  If 7PM-7AM, please contact night-coverage [email or web address] Password [email or web address]

## 2019-09-28 NOTE — TOC Transition Note (Signed)
Transition of Care [facility]) - CM/SW Discharge Note   Patient Details  Name: Darryl White MRN: [id number] Date of Birth: Aug 25, 1940  Transition of Care [facility]) CM/SW Contact:  Boris Sharper, LCSW Phone Number: 09/28/2019, 12:13 PM   Clinical Narrative:    Pt medically stable for discharge per MD. Pt will be transported home by his wife. CSW was notified that pt needs home O2. CSW arranged delivery for O2 to pt's home with Melene Muller of [place].  Final next level of care: Home/Self Care Barriers to Discharge: No Barriers Identified   Patient Goals and CMS Choice        Discharge Placement                Patient to be transferred to facility by: spouse Name of family member notified: Opal Sidles Patient and family notified of of transfer: 09/28/19  Discharge Plan and Services                DME Arranged: Oxygen DME Agency: Other - Comment ([place]) Date DME Agency Contacted: 09/28/19 Time DME Agency Contacted: 1208 Representative spoke with at DME Agency: Melene Muller            Social Determinants of Health ([place]) Interventions     Readmission Risk Interventions No flowsheet data found.

## 2019-09-28 NOTE — Plan of Care (Signed)
Pt is discharged with home 02 - requires 3 lpm [place] to keep sats > greater than 90 with activity Pt sats on RA at rest 90 % ambulating 150 ft sats are 73-74 on ra - ambulating 150 ft on 3 lpm [place] sats 92 % - Oxygen safety reviewed and pt is familiar with these standards as his wife uses home 02 at [facility]

## 2019-09-29 ENCOUNTER — Telehealth: Payer: Self-pay

## 2019-09-29 NOTE — Telephone Encounter (Signed)
Transition Care Management Follow-up Telephone Call  Date of discharge and from where: 09/28/2019, [facility]  How have you been since you were released from the hospital? Patient states that he is doing okay. Gets very short of breath when exerted and uses his oxygen regularly.  Any questions or concerns? No  Items Reviewed:  Did the pt receive and understand the discharge instructions provided? Yes   Medications obtained and verified? Yes   Any new allergies since your discharge? No   Dietary orders reviewed? Yes  Do you have support at home? Yes   Functional Questionnaire: (I = Independent and D = Dependent) ADLs: I  Bathing/Dressing- I  Meal Prep- I  Eating- I  Maintaining continence- I  Transferring/Ambulation- I  Managing Meds- I  Follow up appointments reviewed:   PCP Hospital f/u appt confirmed? Yes  Scheduled to see Alma Friendly, NP on 10/10/2019 @ 10:40 am.  [facility] f/u appt confirmed? Yes  Scheduled to see pulmonology   Are transportation arrangements needed? No   If their condition worsens, is the pt aware to call PCP or go to the Emergency Dept.? Yes  Was the patient provided with contact information for the PCP's office or ED? Yes  Was to pt encouraged to call back with questions or concerns? Yes

## 2019-09-29 NOTE — Telephone Encounter (Signed)
noted 

## 2019-10-06 ENCOUNTER — Other Ambulatory Visit: Payer: Self-pay

## 2019-10-06 ENCOUNTER — Other Ambulatory Visit: Payer: Self-pay | Admitting: Acute Care

## 2019-10-06 ENCOUNTER — Ambulatory Visit
Admission: RE | Admit: 2019-10-06 | Discharge: 2019-10-06 | Disposition: A | Discharge status: Home / Self Care | Payer: Medicare PPO | Source: Ambulatory Visit | Attending: Acute Care | Admitting: Acute Care

## 2019-10-06 ENCOUNTER — Telehealth: Payer: Self-pay | Admitting: Pulmonary Disease

## 2019-10-06 ENCOUNTER — Ambulatory Visit
Admission: RE | Admit: 2019-10-06 | Discharge: 2019-10-06 | Disposition: A | Discharge status: Home / Self Care | Payer: Medicare PPO | Attending: Pulmonary Disease | Admitting: Pulmonary Disease

## 2019-10-06 DIAGNOSIS — R0600 Dyspnea, unspecified: Secondary | ICD-10-CM

## 2019-10-06 DIAGNOSIS — J181 Lobar pneumonia, unspecified organism: Secondary | ICD-10-CM

## 2019-10-06 MED ORDER — DOXYCYCLINE HYCLATE 100 MG PO TABS: 100.0000 mg | ORAL_TABLET | Freq: Two times a day (BID) | ORAL | 0 refills | Status: DC

## 2019-10-06 NOTE — Telephone Encounter (Signed)
Called and spoke to patient's spouse, [name].  Patient had recent admission 09/26/2019 for COPD exacerbation. Patient was discharged with zpak and prednisone, 2.5L cont and a sample of incruse .  He completed course of zpak and prednisone on Wednesday. Breathing improved with treatment but has since worsened. C/o prod cough with pale yellow mucus, right side discomfort under armpit into back and increased sob. spo2 is maintaining around 90% on 2.5L. Opal Sidles gave patient 20mg  of lasix yesterday, as she is concerned about pleural effusion.  Denied fever, chills or sweats. He completed sample of incruse on Friday. Patient is currently taking Breztri.  Sarah, please advise as Dr. Patsey Berthold is unavailable.

## 2019-10-06 NOTE — Telephone Encounter (Signed)
Called and spoke to patient's spouse, Opal Sidles [facility]) and relayed below message/recommendations.  Opal Sidles voiced her understanding and had no further questions.  Nothing further is needed at this time.

## 2019-10-06 NOTE — Telephone Encounter (Signed)
Pt. Needs a CXR.We will call once it has been read. I have ordered it at [facility]. Please let patient spouse know if sats drop below 88% on oxygen to increase to 3 L and seek emergency care. Thanks

## 2019-10-07 ENCOUNTER — Encounter: Payer: Self-pay | Admitting: Pulmonary Disease

## 2019-10-07 ENCOUNTER — Ambulatory Visit: Payer: Medicare PPO | Admitting: Pulmonary Disease

## 2019-10-07 VITALS — BP 100/70 | HR 97 | Temp 97.5°F | Ht 66.0 in | Wt 162.0 lb

## 2019-10-07 DIAGNOSIS — J9601 Acute respiratory failure with hypoxia: Secondary | ICD-10-CM

## 2019-10-07 DIAGNOSIS — J441 Chronic obstructive pulmonary disease with (acute) exacerbation: Secondary | ICD-10-CM | POA: Diagnosis not present

## 2019-10-07 DIAGNOSIS — J181 Lobar pneumonia, unspecified organism: Secondary | ICD-10-CM | POA: Diagnosis not present

## 2019-10-07 MED ORDER — IPRATROPIUM-ALBUTEROL 0.5-2.5 (3) MG/3ML IN SOLN: 3.0000 mL | Freq: Four times a day (QID) | RESPIRATORY_TRACT | 11 refills | Status: AC | PRN

## 2019-10-07 MED ORDER — PREDNISONE 10 MG (21) PO TBPK
ORAL_TABLET | ORAL | 0 refills | Status: DC
Start: 2019-10-07 — End: 2019-10-20

## 2019-10-07 NOTE — Patient Instructions (Addendum)
We are going to put her on nebulizers four times a day. Put your [name] aside while you are using the nebulizers. Once you are better we can resume the [facility] but for now I just want to to use the nebulizers. You will start DuoNeb four times a day.  We are sending a prescription for a prednisone taper.   There was a prescription for doxycycline sent for you yesterday gone ahead and start that medication.   You will need 4 L of oxygen. We are going to discontinue your oxygen through [employer] and switched to [place].   We will see you in follow-up in 4 to 6 weeks time call sooner should any new problems arise.

## 2019-10-07 NOTE — Progress Notes (Signed)
Subjective:    Patient ID: Darryl White, male    DOB: [birth year], 79 y.o.   MRN: [id number]  HPI Patient is a 79 year old former smoker with a history of COPD (FEV1 2.38 L or 64% predicted), who presents for an acute visit after hospitalization on 6 August for COPD exacerbation. He was placed on 2 to 3 L of oxygen upon discharge. 28 September 2019. He was discharged with prednisone and a azithromycin and was instructed to continue his ICS/LABA/LAMA therapy. Since discharge the patient has continued to do poorly. Has had increasing issues with cough with purulent sputum. No fevers. Generalized malaise. He also has significant breathlessness. For 5 days post discharge. No taper. Chest x-ray yesterday showed peripheral right lung infiltrate concerning for pneumonia. The patient is on Eliquis for paroxysmal atrial fibrillation. 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 Patient voices no other complaint today.   Review of Systems A 10 point review of systems was performed and it is as noted above otherwise negative.  Allergies  Allergen Reactions  . Sulfa Antibiotics Hives and Itching  . Penicillins Hives and Palpitations    Did it involve swelling of the face/tongue/throat, SOB, or low BP? No Did it involve sudden or severe rash/hives, skin peeling, or any reaction on the inside of your mouth or nose? No Did you need to seek medical attention at a hospital or doctor's office? Unknown When did it last happen?childhood allergy If all above answers are "NO", may proceed with cephalosporin use.    Current Meds  Medication Sig  . acetaminophen (ACETAMINOPHEN 8 HOUR) 650 MG CR tablet Take 650 mg by mouth every 8 (eight) hours as needed for pain.  Marland Kitchen albuterol (PROAIR HFA) 108 (90 Base) MCG/ACT inhaler Inhale 1-2 puffs into the lungs every 6 (six) hours as needed for wheezing or shortness of breath.  . brimonidine (ALPHAGAN)  0.15 % ophthalmic solution Place 1 drop into both eyes 2 (two) times daily.   . Budeson-Glycopyrrol-Formoterol (BREZTRI AEROSPHERE) 160-9-4.8 MCG/ACT AERO Inhale 2 puffs into the lungs 2 (two) times daily.  . Cholecalciferol (VITAMIN D3) 50 MCG (2000 UT) capsule Take 1 capsule (2,000 Units total) by mouth daily with lunch.  Marland Kitchen ELIQUIS 5 MG TABS tablet TAKE 1 TABLET BY MOUTH TWICE DAILY  . famotidine (PEPCID) 20 MG tablet Take 20 mg by mouth 2 (two) times daily.  . fenofibrate (TRICOR) 145 MG tablet TAKE 1 TABLET(145 MG) BY MOUTH DAILY  . folic acid (FOLVITE) 1 MG tablet Take 1 mg by mouth daily.  Marland Kitchen gabapentin (NEURONTIN) 100 MG capsule Take 100 mg by mouth 3 (three) times daily.  . methotrexate 2.5 MG tablet Take 12.5 mg by mouth every Sunday.   . sodium chloride (OCEAN) 0.65 % SOLN nasal spray Place 1 spray into both nostrils at bedtime as needed for congestion.   . traMADol (ULTRAM) 50 MG tablet Take 25 mg by mouth at bedtime as needed for moderate pain.    Immunization History  Administered Date(s) Administered  . Influenza, High Dose Seasonal PF 12/15/2016, 12/08/2017, 10/26/2018  . Influenza, Seasonal, Injecte, Preservative Fre 12/09/2017  . Influenza,inj,Quad PF,6+ Mos 12/15/2016  . Influenza-Unspecified 11/24/2015  . PFIZER SARS-COV-2 Vaccination 03/03/2019, 03/24/2019  . Pneumococcal Conjugate-13 01/30/2014  . Pneumococcal Polysaccharide-23 11/25/2015  . Td 05/04/2016  . Tdap 05/04/2016  . Zoster 10/21/2011  . Zoster Recombinat (Shingrix) 09/17/2018, 11/18/2018       Objective:   Physical Exam  BP 100/70 (BP Location: Left Arm, Cuff Size: Normal)   Pulse 97   Temp (!) 97.5 F (36.4 C) (Temporal)   Ht 5\' 6"  (1.676 m)   Wt 162 lb (73.5 kg)   SpO2 93%   BMI 26.15 kg/m  GENERAL: Awake and alert, looks acutely ill. Mild conversational dyspnea. On oxygen at 3 L/min. Tachypnea is mild to moderate. HEAD: Normocephalic, atraumatic.  EYES: Pupils equal, round, reactive to light.   No scleral icterus.  MOUTH: Nose/mouth/throat not examined due to masking requirements for COVID 19. NECK: Supple. No thyromegaly. Trachea midline. No JVD.  No adenopathy. PULMONARY: Bilateral air entry is symmetrical, coarse breath sounds, diffuse rhonchi. No wheezes. CARDIOVASCULAR: S1 and S2. Tachycardic rate with normal rhythm. No murmurs noted. ABDOMEN: Nondistended, soft, nontender. MUSCULOSKELETAL: No joint deformity, no clubbing, no edema.  NEUROLOGIC: No focal deficit. SKIN: Intact,warm,dry. Skin without rashes. PSYCH: Awake, alert.  Ambulatory oximetry: Patient desaturated to 87% on room air upon standing. He was then titrated to 4 L/min to maintain 92% saturation with ambulation. On 3 L/min he was not holding saturations steady. The patient therefore will need 4 L/min of oxygen.  Chest x-ray performed 06 October 2019, independently reviewed:       Assessment & Plan:     ICD-10-CM   1. COPD with acute exacerbation ([place])  J44.1 Ambulatory Referral for DME    Ambulatory Referral for DME    Ambulatory Referral for DME   Appears to have residual pneumonia Prednisone taper and doxycycline DuoNebs 4 times a day  2. Lobar pneumonia, unspecified organism ([place])  J18.1 Ambulatory Referral for DME    Ambulatory Referral for DME    Ambulatory Referral for DME   Start doxycycline 100 mg twice daily  3. Acute respiratory failure with hypoxia (HCC)  J96.01    Patient will need 4 L of oxygen continuous   Orders Placed This Encounter  Procedures  . Ambulatory Referral for DME    Referral Priority:   Routine    Referral Type:   Durable Medical Equipment Purchase    Number of Visits Requested:   1  . Ambulatory Referral for DME    Referral Priority:   Routine    Referral Type:   Durable Medical Equipment Purchase    Number of Visits Requested:   1  . Ambulatory Referral for DME    Referral Priority:   Routine    Referral Type:   Durable Medical Equipment Purchase    Number of  Visits Requested:   1   Meds ordered this encounter  Medications  . predniSONE (STERAPRED UNI-PAK 21 TAB) 10 MG (21) TBPK tablet    Sig: Take as directed in the package. This is a taper pack.    Dispense:  1 each    Refill:  0  . ipratropium-albuterol (DUONEB) 0.5-2.5 (3) MG/3ML SOLN    Sig: Take 3 mLs by nebulization every 6 (six) hours as needed.    Dispense:  360 mL    Refill:  11   Discussion:  Patient presents with a prolonged exacerbation of COPD. Appears that he still have some issues with lobar pneumonia. He has underlying severe COPD, fibrosis and lung cancer. He had doxycycline called in yesterday however did not get medication filled yet. Advised to start doxycycline 100 mg twice daily. He also received a prednisone taper. He has been placed on DuoNeb nebulizers for now. We'll also add a referral to [facility] for supplemental oxygen at 4 L/min via  nasal cannula. In 4 to 6 weeks time he is to contact us prior to that time should any new difficulties arise.   Total visit time 50 minutes.   Renold Don, MD [facility]   *This note was dictated using voice recognition software/Dragon.  Despite best efforts to proofread, errors can occur which can change the meaning.  Any change was purely unintentional.

## 2019-10-07 NOTE — Progress Notes (Signed)
See telephone note dated 10/06/2019. Results were called to the patient , antibiotics were called in. Pt is to be scheduled for in person OV in 1 week.

## 2019-10-08 ENCOUNTER — Telehealth: Payer: Self-pay

## 2019-10-08 NOTE — Telephone Encounter (Signed)
Done

## 2019-10-08 NOTE — Telephone Encounter (Signed)
Received call from patient.  He is requesting update on oxygen and nebulizer delivery. Order was placed to [facility] 10/07/2019. Per Suanne Marker, oxygen and nebulizer can not be delivered until office note is completed.   Dr. Patsey Berthold, please let me know when note is completed. Thanks!

## 2019-10-08 NOTE — Telephone Encounter (Signed)
Per Dr. Patsey Berthold verbally- office note has been completed.   Will route to [facility] to make aware.

## 2019-10-09 NOTE — Telephone Encounter (Signed)
Nebulizer and Rx faxed 10/08/2019 to [facility] along with 02 request for [employer].    Referral sent to [facility] ([facility] to D/C 02.  Huey Romans should be reaching out to patient to get the above scheduled for delivery. Rhonda J Cobb

## 2019-10-10 ENCOUNTER — Encounter: Payer: Self-pay | Admitting: Primary Care

## 2019-10-10 ENCOUNTER — Ambulatory Visit: Payer: Medicare PPO | Admitting: Primary Care

## 2019-10-10 ENCOUNTER — Other Ambulatory Visit: Payer: Self-pay

## 2019-10-10 DIAGNOSIS — M0579 Rheumatoid arthritis with rheumatoid factor of multiple sites without organ or systems involvement: Secondary | ICD-10-CM

## 2019-10-10 DIAGNOSIS — J9601 Acute respiratory failure with hypoxia: Secondary | ICD-10-CM

## 2019-10-10 NOTE — Patient Instructions (Signed)
Talk with Dr. Posey Pronto about reducing or stopping the methotrexate.  Talk with Hildred Alamin about a portable oxygen bag.  Complete the Covid-19 Booster vaccine when applicable.  No one is allowed around you unless they've been vaccinated against Covid-19.  It was a pleasure to see you today!

## 2019-10-10 NOTE — Progress Notes (Signed)
Subjective:    Patient ID: Darryl White, male    DOB: 05/23/1940, 79 y.o.   MRN: [id number]  HPI  This visit occurred during the SARS-CoV-2 public health emergency.  Safety protocols were in place, including screening questions prior to the visit, additional usage of staff PPE, and extensive cleaning of exam room while observing appropriate contact time as indicated for disinfecting solutions.   Darryl White is a 79 year old male   He presented to [facility] ED on 09/26/19 with a chief complaint of shortness of breath and hypoxia while helping a friend to the doctor's office. He checked his pulse oxygen level with his wife's pulse oximeter and saw a level in the 70's on room air.   During his time in the ED he was noted to desaturate with readings in the mid 80's on room air. Chest xray with COPD and possible pneumonitis. He was treated with steroids, antibiotics, and breathing treatments. He was admitted for further evaluation.  During his time in the hospital he was continued on IV steroids, antibiotics, fluids. Breathing treatments continued. He felt better, but unfortunately continued to have room air oxygen saturation levels in 70's with ambulation, improved to 92% with 3 L of nasal cannula. His normal medications including ICS/LABA/LAMA and SABA/SAMA were continued, so he was prescribed supplemental oxygen, prednisone, and azithromycin upon discharge.   He was discharged home on 09/28/19 with recommendations for PCP and pulmonology follow up. It was also recommended that he discuss methotrexate use with his rheumatologist as this may have caused his pneumonitis.   Since his discharge home he's undergone a repeat chest xray on 10/06/19 due to symptoms of shortness of breath and fatigue. This revealed a new opacity in the peripheral right mid lung concerning for pneumonia. His pulmonology provider sent in a seven day course of Doxycycline. He's also seen his primary pulmonologist (10/07/19) who  prescribed DuoNebs, oral steroids, and oxygen.  Today he is compliant to 4 liters of supplemental oxygen for which he uses continuously. He's compliant to his oral steroids and antibiotics. Overall he's feeling better today, does get fatigued with shortness of breath with minimal activity. Appetite has improved and he's trying to stay hydrated with Boost and water.   He is due September 2021 for repeat CT chest with contrast, will see his oncologist and radiology oncologist one week later. He is planning on having the Covid-19 Booster vaccine once his pneumonia improves. He is compliant to his methotrexate 12.5 mg weekly, has not spoken with his rheumatologist regarding discontinuation.  BP Readings from Last 3 Encounters:  10/10/19 116/70  10/07/19 100/70  09/28/19 116/77     Review of Systems  Constitutional: Positive for fatigue. Negative for fever.  HENT: Negative for congestion.   Respiratory: Positive for shortness of breath.   Musculoskeletal: Negative for arthralgias.  Neurological: Negative for dizziness.       Past Medical History:  Diagnosis Date  . Arthritis 2019   dx of osteoarthritis and rhemautoid arthritis  . Cancer ([place])    lung  . Cataract 2019   left eye  . Chicken pox   . Collagen vascular disease ([place])   . 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 COPD (chronic obstructive pulmonary disease) ([place])   . Diverticulitis   . GERD (gastroesophageal reflux disease)   . 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 History of hiatal hernia   . History of stomach ulcers   .  Hyperlipidemia   . Lymphocytic colitis   . Orthostatic hypotension   . Osteoarthritis   . Pancreatitis   . Paroxysmal atrial fibrillation (HCC)   . Presence of permanent cardiac pacemaker   . 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 Marital status: Married    Spouse name: Not on file  . Number of children: 2  . Years of  education: Not on file  . Highest education level: Not on file  Occupational History  . Occupation: Retired  Tobacco Use  . Smoking status: Former Smoker    Packs/day: 1.00    Years: 60.00    Pack years: 60.00    Types: Cigarettes    Quit date: 04/29/2017    Years since quitting: 2.4  . Smokeless tobacco: Never Used  Vaping Use  . Vaping Use: Never used  Substance and Sexual Activity  . Alcohol use: Yes    Alcohol/week: 5.0 standard drinks    Types: 5 Glasses of wine per week  . Drug use: No  . Sexual activity: Never  Other Topics Concern  . Not on file  Social History Narrative   Married.   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      Does not have a living will.   Desires CPR, does not want prolonged life support if futile.   Social Determinants of Health   Financial Resource Strain: Low Risk   . Difficulty of Paying Living Expenses: Not hard at all  Food Insecurity: No Food Insecurity  . Worried About Charity fundraiser in the Last Year: Never true  . Ran Out of Food in the Last Year: Never true  Transportation Needs: No Transportation Needs  . Lack of Transportation (Medical): No  . Lack of Transportation (Non-Medical): No  Physical Activity: Sufficiently Active  . Days of Exercise per Week: 7 days  . Minutes of Exercise per Session: 60 min  Stress: No Stress Concern Present  . Feeling of Stress : Not at all  Social Connections:   . Frequency of Communication with Friends and Family: Not on file  . Frequency of Social Gatherings with Friends and Family: Not on file  . Attends Religious Services: Not on file  . Active Member of Clubs or Organizations: Not on file  . Attends Archivist Meetings: Not on file  . Marital Status: Not on file  Intimate Partner Violence: Not At Risk  . Fear of Current or Ex-Partner: No  . Emotionally Abused: No  . Physically Abused: No  . Sexually Abused: No    Past Surgical History:  Procedure Laterality  Date  . APPENDECTOMY    . BACK SURGERY     bone chip lasered  . CARDIAC CATHETERIZATION    . CATARACT EXTRACTION W/ INTRAOCULAR LENS IMPLANT Right   . COLON SURGERY  1990's   12" removed of large intestine  . EYE SURGERY    . INSERT / REPLACE / [place]  . large intestine block    . PACEMAKER INSERTION     MDT dual chamber pacemaker  . PORTA CATH INSERTION N/A 04/17/2019   Procedure: PORTA CATH INSERTION;  Surgeon: Algernon Huxley, MD;  Location: [facility];  Service: Cardiovascular;  Laterality: N/A;  . PPM GENERATOR CHANGEOUT N/A 12/02/2018   Procedure: PPM GENERATOR CHANGEOUT;  Surgeon: Deboraha Sprang, MD;  Location: [facility];  Service: Cardiovascular;  Laterality:  N/A;  . TONSILLECTOMY AND ADENOIDECTOMY    . VIDEO BRONCHOSCOPY WITH ENDOBRONCHIAL ULTRASOUND N/A 04/07/2019   Procedure: VIDEO BRONCHOSCOPY WITH ENDOBRONCHIAL ULTRASOUND;  Surgeon: Tyler Pita, MD;  Location: [facility];  Service: Pulmonary;  Laterality: N/A;    Family History  Problem Relation Age of Onset  . Lung cancer Mother   . Heart disease Father   . Alzheimer's disease Father     Allergies  Allergen Reactions  . Sulfa Antibiotics Hives and Itching  . Penicillins Hives and Palpitations    Did it involve swelling of the face/tongue/throat, SOB, or low BP? No Did it involve sudden or severe rash/hives, skin peeling, or any reaction on the inside of your mouth or nose? No Did you need to seek medical attention at a hospital or doctor's office? Unknown When did it last happen?childhood allergy If all above answers are "NO", may proceed with cephalosporin use.     Current Outpatient Medications on File Prior to Visit  Medication Sig Dispense Refill  . acetaminophen (ACETAMINOPHEN 8 HOUR) 650 MG CR tablet Take 650 mg by mouth every 8 (eight) hours as needed for pain.    Marland Kitchen albuterol (PROAIR HFA) 108 (90 Base) MCG/ACT inhaler Inhale 1-2 puffs into the lungs every 6  (six) hours as needed for wheezing or shortness of breath. 18 g 5  . brimonidine (ALPHAGAN) 0.15 % ophthalmic solution Place 1 drop into both eyes 2 (two) times daily.     . Budeson-Glycopyrrol-Formoterol (BREZTRI AEROSPHERE) 160-9-4.8 MCG/ACT AERO Inhale 2 puffs into the lungs 2 (two) times daily. 10.7 g 6  . Cholecalciferol (VITAMIN D3) 50 MCG (2000 UT) capsule Take 1 capsule (2,000 Units total) by mouth daily with lunch. 30 capsule 1  . ELIQUIS 5 MG TABS tablet TAKE 1 TABLET BY MOUTH TWICE DAILY 60 tablet 5  . famotidine (PEPCID) 20 MG tablet Take 20 mg by mouth 2 (two) times daily.    . fenofibrate (TRICOR) 145 MG tablet TAKE 1 TABLET(145 MG) BY MOUTH DAILY 90 tablet 1  . folic acid (FOLVITE) 1 MG tablet Take 1 mg by mouth daily.    Marland Kitchen gabapentin (NEURONTIN) 100 MG capsule Take 100 mg by mouth 3 (three) times daily.    Marland Kitchen ipratropium-albuterol (DUONEB) 0.5-2.5 (3) MG/3ML SOLN Take 3 mLs by nebulization every 6 (six) hours as needed. 360 mL 11  . lidocaine-prilocaine (EMLA) cream Apply to affected area once 30 g 3  . methotrexate 2.5 MG tablet Take 12.5 mg by mouth every Sunday.     . ondansetron (ZOFRAN) 8 MG tablet Take 1 tablet (8 mg total) by mouth 2 (two) times daily as needed for refractory nausea / vomiting. Start on day 3 after carboplatin chemo. 30 tablet 1  . predniSONE (STERAPRED UNI-PAK 21 TAB) 10 MG (21) TBPK tablet Take as directed in the package. This is a taper pack. 1 each 0  . sodium chloride (OCEAN) 0.65 % SOLN nasal spray Place 1 spray into both nostrils at bedtime as needed for congestion.     . traMADol (ULTRAM) 50 MG tablet Take 25 mg by mouth at bedtime as needed for moderate pain.     Marland Kitchen doxycycline (VIBRA-TABS) 100 MG tablet Take 1 tablet (100 mg total) by mouth 2 (two) times daily. (Patient not taking: Reported on 10/10/2019) 14 tablet 0   No current facility-administered medications on file prior to visit.    BP 116/70   Pulse 82   Temp (!) 96 F (35.6 C) (Temporal)  Ht 5\' 6"  (1.676 m)   Wt 162 lb (73.5 kg)   SpO2 98%   BMI 26.15 kg/m    Objective:   Physical Exam Constitutional:      General: He is not in acute distress.    Appearance: He is not ill-appearing.  Cardiovascular:     Rate and Rhythm: Normal rate and regular rhythm.  Pulmonary:     Effort: Pulmonary effort is normal.     Breath sounds: Rhonchi and rales present.  Musculoskeletal:     Cervical back: Neck supple.  Skin:    General: Skin is warm and dry.  Neurological:     Mental Status: He is oriented to person, place, and time.            Assessment & Plan:

## 2019-10-10 NOTE — Assessment & Plan Note (Signed)
Recommended he discuss methotrexate use with rheumatology given recurrent pneumonia while also having lung cancer.

## 2019-10-10 NOTE — Assessment & Plan Note (Signed)
Recent hospital admission for pneumonitis, treated with oral steroids and antibiotics, also duonebs. Discharged home on supplemental oxygen. Unfortunately developed a new pneumonia and is now feeling better.  Continue oxygen, steroids, antibiotics.  Discussed to speak with his rheumatologist regarding methotrexate use.  He will be getting the Covid-19 booster vaccine.  Hospital notes, labs, imaging reviewed.

## 2019-10-13 ENCOUNTER — Telehealth: Payer: Self-pay | Admitting: *Deleted

## 2019-10-13 ENCOUNTER — Telehealth: Payer: Self-pay

## 2019-10-13 NOTE — Telephone Encounter (Signed)
Spoke with patient and his wife, he is feeling better now, continues to feel jittery somewhat.  He finished his prednisone today, is completing DuoNeb's four times daily per pulmonology request.  Suspect that the combination of the DuoNeb along with prednisone could be contributing to jittery/anxious feeling. He doesn't appear to be having a panic attack now.  Overall his dyspnea has improved with his treatment, will have him reduce use of DuoNebs to three times daily and update tomorrow if symptoms return.

## 2019-10-13 NOTE — Telephone Encounter (Signed)
Spoke to pt's wife. Started about 1.5 hours ago. Can't settle down. Feels anxious. This is a new issue for him. Used nebulizer about 11 am. NOt using the inhaler. But uses Duoneb. Please call [phone].

## 2019-10-13 NOTE — Telephone Encounter (Signed)
Pt called in to ask if his CT scan and follow up appt scheduled in early Sept can be moved out a few weeks? He was recently admitted for pneumonia and COPD exacerbation and continues to recover at home. He would like a few more weeks to recover before having another CT scan if it is okay with Dr. Tasia Catchings.   Please advise.

## 2019-10-13 NOTE — Telephone Encounter (Signed)
Yes. Thanks 

## 2019-10-14 NOTE — Telephone Encounter (Signed)
Appts adjusted and pt made aware.

## 2019-10-17 ENCOUNTER — Telehealth: Payer: Self-pay | Admitting: Student

## 2019-10-17 ENCOUNTER — Ambulatory Visit (INDEPENDENT_AMBULATORY_CARE_PROVIDER_SITE_OTHER): Payer: Medicare PPO | Admitting: *Deleted

## 2019-10-17 DIAGNOSIS — I495 Sick sinus syndrome: Secondary | ICD-10-CM | POA: Diagnosis not present

## 2019-10-17 LAB — CUP PACEART REMOTE DEVICE CHECK
Battery Remaining Longevity: 169 mo
Battery Voltage: 3.17 V
Brady Statistic AP VP Percent: 0.02 %
Brady Statistic AP VS Percent: 40.54 %
Brady Statistic AS VP Percent: 0.02 %
Brady Statistic AS VS Percent: 59.44 %
Brady Statistic RA Percent Paced: 35.54 %
Brady Statistic RV Percent Paced: 0.23 %
Date Time Interrogation Session: 20210826191415
Implantable Lead Implant Date: 19941110
Implantable Lead Implant Date: 19941110
Implantable Lead Location: 753859
Implantable Lead Location: 753860
Implantable Lead Model: 5034
Implantable Lead Model: 5534
Implantable Pulse Generator Implant Date: 20201012
Lead Channel Impedance Value: 760 Ohm
Lead Channel Impedance Value: 836 Ohm
Lead Channel Impedance Value: 931 Ohm
Lead Channel Impedance Value: 931 Ohm
Lead Channel Pacing Threshold Amplitude: 0.5 V
Lead Channel Pacing Threshold Amplitude: 0.5 V
Lead Channel Pacing Threshold Pulse Width: 0.4 ms
Lead Channel Pacing Threshold Pulse Width: 0.4 ms
Lead Channel Sensing Intrinsic Amplitude: 1.125 mV
Lead Channel Sensing Intrinsic Amplitude: 1.125 mV
Lead Channel Sensing Intrinsic Amplitude: 14.125 mV
Lead Channel Sensing Intrinsic Amplitude: 14.125 mV
Lead Channel Setting Pacing Amplitude: 1.5 V
Lead Channel Setting Pacing Amplitude: 2.5 V
Lead Channel Setting Pacing Pulse Width: 0.4 ms
Lead Channel Setting Sensing Sensitivity: 4 mV

## 2019-10-17 NOTE — Telephone Encounter (Signed)
Called and spoke with patient and spouse.  They are aware of appt 10/20/19 @9 :30 am with Roderic Palau, NP.

## 2019-10-17 NOTE — Telephone Encounter (Signed)
Scheduled remote today with persistent atrial fibrillation since ~ 8/16 when patient was being treated for COPD exacerbation. (See CV procedure= CUP PACEART from today)  He states he is doing "OK" but remains more SOB. He is now on chronic O2 at 4 lpm and taking nebulizers throughout the day. I think an attempt at NSR may help him improve somewhat. I discussed with patient and he is willing to have appointment in AF clinic to discuss options. He would be anxious about any sedation given his lung issues.   Please call and schedule for AF clinic visit next week, thank you!  Legrand Como [address]" [place], PA-C  10/17/2019 8:34 AM

## 2019-10-20 ENCOUNTER — Other Ambulatory Visit: Payer: Self-pay

## 2019-10-20 ENCOUNTER — Ambulatory Visit (HOSPITAL_COMMUNITY)
Admission: RE | Admit: 2019-10-20 | Discharge: 2019-10-20 | Disposition: A | Discharge status: Home / Self Care | Payer: Medicare PPO | Source: Ambulatory Visit | Attending: Nurse Practitioner | Admitting: Nurse Practitioner

## 2019-10-20 ENCOUNTER — Other Ambulatory Visit (HOSPITAL_COMMUNITY): Payer: Self-pay | Admitting: Nurse Practitioner

## 2019-10-20 ENCOUNTER — Encounter (HOSPITAL_COMMUNITY): Payer: Self-pay | Admitting: Nurse Practitioner

## 2019-10-20 VITALS — BP 112/70 | HR 146 | Ht 66.0 in | Wt 160.0 lb

## 2019-10-20 DIAGNOSIS — Z87891 Personal history of nicotine dependence: Secondary | ICD-10-CM | POA: Insufficient documentation

## 2019-10-20 DIAGNOSIS — M069 Rheumatoid arthritis, unspecified: Secondary | ICD-10-CM | POA: Diagnosis not present

## 2019-10-20 DIAGNOSIS — I4819 Other persistent atrial fibrillation: Secondary | ICD-10-CM | POA: Diagnosis present

## 2019-10-20 DIAGNOSIS — E785 Hyperlipidemia, unspecified: Secondary | ICD-10-CM | POA: Insufficient documentation

## 2019-10-20 DIAGNOSIS — J449 Chronic obstructive pulmonary disease, unspecified: Secondary | ICD-10-CM | POA: Insufficient documentation

## 2019-10-20 DIAGNOSIS — D6869 Other thrombophilia: Secondary | ICD-10-CM | POA: Diagnosis not present

## 2019-10-20 DIAGNOSIS — Z7901 Long term (current) use of anticoagulants: Secondary | ICD-10-CM | POA: Insufficient documentation

## 2019-10-20 DIAGNOSIS — K219 Gastro-esophageal reflux disease without esophagitis: Secondary | ICD-10-CM | POA: Diagnosis not present

## 2019-10-20 DIAGNOSIS — Z950 Presence of cardiac pacemaker: Secondary | ICD-10-CM | POA: Diagnosis not present

## 2019-10-20 DIAGNOSIS — Z79899 Other long term (current) drug therapy: Secondary | ICD-10-CM | POA: Insufficient documentation

## 2019-10-20 DIAGNOSIS — Z85118 Personal history of other malignant neoplasm of bronchus and lung: Secondary | ICD-10-CM | POA: Insufficient documentation

## 2019-10-20 LAB — CBC
HCT: 42.2 % (ref 39.0–52.0)
Hemoglobin: 14.3 g/dL (ref 13.0–17.0)
MCH: 36.2 pg — ABNORMAL HIGH (ref 26.0–34.0)
MCHC: 33.9 g/dL (ref 30.0–36.0)
MCV: 106.8 fL — ABNORMAL HIGH (ref 80.0–100.0)
Platelets: 191 10*3/uL (ref 150–400)
RBC: 3.95 MIL/uL — ABNORMAL LOW (ref 4.22–5.81)
RDW: 16 % — ABNORMAL HIGH (ref 11.5–15.5)
WBC: 9.8 10*3/uL (ref 4.0–10.5)
nRBC: 0 % (ref 0.0–0.2)

## 2019-10-20 LAB — BASIC METABOLIC PANEL
Anion gap: 9 (ref 5–15)
BUN: 17 mg/dL (ref 8–23)
CO2: 26 mmol/L (ref 22–32)
Calcium: 9.2 mg/dL (ref 8.9–10.3)
Chloride: 95 mmol/L — ABNORMAL LOW (ref 98–111)
Creatinine, Ser: 0.95 mg/dL (ref 0.61–1.24)
GFR calc Af Amer: >60 mL/min (ref >60)
GFR calc non Af Amer: >60 mL/min (ref >60)
Glucose, Bld: 134 mg/dL — ABNORMAL HIGH (ref 70–99)
Potassium: 5.3 mmol/L — ABNORMAL HIGH (ref 3.5–5.1)
Sodium: 130 mmol/L — ABNORMAL LOW (ref 135–145)

## 2019-10-20 MED ORDER — DILTIAZEM HCL 30 MG PO TABS
ORAL_TABLET | ORAL | 1 refills | Status: DC
Start: 2019-10-20 — End: 2019-10-20

## 2019-10-20 NOTE — Patient Instructions (Addendum)
Someone from the [place] office will call you with date/time for cardioversion at the [place] office.     Cardizem 30mg  -- take 1 tablet every 4 hours AS NEEDED for AFIB heart rate >100 as long as top number of blood pressure >100.

## 2019-10-20 NOTE — Progress Notes (Addendum)
Primary Care Physician: Pleas Koch, NP Referring Physician: Device clinic EP: Darryl White is a 79 y.o. male with a h/o PPM, sever bullous COPD, rt lung cancer. S/p concurrent chemoradiation, RA on metohotrexate paroxysmal afib ,Lung CA, on eliquis 5 mg bid for a CHA2DS2VASc score of at least 2, that is in the afib clinic for persistent  onset of afib since 8/16. Of note, he was admitted for 8/6 to 8/8 for COPD exacerbation and treated with prednisone and a z pak. He was also thought to have had an component of interstitial  process/pnuemonits, possibly contributed to by methotrexate.  In the clinic, he has RVR but he does not feel any different. States he is short of breath all the time. The wife gave him  a 30 mg Cardizem last night and it did bring down his HR but RX has expired . I will  send in new refill.   We discussed cardioversion today and he is in favor but the wife's nerves were "torn up" driving  into [place] and they want care out of [facility] office. He is a patient of Dr. [name]  and sees him regularly there.   Today, he denies symptoms of palpitations, chest pain, shortness of breath, orthopnea, PND, lower extremity edema, dizziness, presyncope, syncope, or neurologic sequela. The patient is tolerating medications without difficulties and is otherwise without complaint today.   Past Medical History:  Diagnosis Date  . Arthritis 2019   dx of osteoarthritis and rhemautoid arthritis  . Cancer ([place])    lung  . Cataract 2019   left eye  . Chicken pox   . Collagen vascular disease ([place])   . 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 COPD (chronic obstructive pulmonary disease) ([place])   . Diverticulitis   . GERD (gastroesophageal reflux disease)   . 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 History of hiatal hernia   . History of stomach ulcers   . Hyperlipidemia   . Lymphocytic colitis   . Orthostatic hypotension   .  Osteoarthritis   . Pancreatitis   . Paroxysmal atrial fibrillation (HCC)   . Presence of permanent cardiac pacemaker   . 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 APPENDECTOMY    . BACK SURGERY     bone chip lasered  . CARDIAC CATHETERIZATION    . CATARACT EXTRACTION W/ INTRAOCULAR LENS IMPLANT Right   . COLON SURGERY  1990's   12" removed of large intestine  . EYE SURGERY    . INSERT / REPLACE / [place]  . large intestine block    . PACEMAKER INSERTION     MDT dual chamber pacemaker  . PORTA CATH INSERTION N/A 04/17/2019   Procedure: PORTA CATH INSERTION;  Surgeon: Algernon Huxley, MD;  Location: [place] [facility];  Service: Cardiovascular;  Laterality: N/A;  . PPM GENERATOR CHANGEOUT N/A 12/02/2018   Procedure: PPM GENERATOR CHANGEOUT;  Surgeon: Deboraha Sprang, MD;  Location: [place] [facility];  Service: Cardiovascular;  Laterality: N/A;  . TONSILLECTOMY AND ADENOIDECTOMY    . VIDEO BRONCHOSCOPY WITH ENDOBRONCHIAL ULTRASOUND N/A 04/07/2019   Procedure: VIDEO BRONCHOSCOPY WITH ENDOBRONCHIAL ULTRASOUND;  Surgeon: Tyler Pita, MD;  Location: [facility] ORS;  Service: Pulmonary;  Laterality: N/A;    Current Outpatient Medications  Medication Sig Dispense Refill  .  acetaminophen (ACETAMINOPHEN 8 HOUR) 650 MG CR tablet Take 650 mg by mouth every 8 (eight) hours as needed for pain.    Marland Kitchen albuterol (PROAIR HFA) 108 (90 Base) MCG/ACT inhaler Inhale 1-2 puffs into the lungs every 6 (six) hours as needed for wheezing or shortness of breath. 18 g 5  . brimonidine (ALPHAGAN) 0.15 % ophthalmic solution Place 1 drop into both eyes 2 (two) times daily.     . Budeson-Glycopyrrol-Formoterol (BREZTRI AEROSPHERE) 160-9-4.8 MCG/ACT AERO Inhale 2 puffs into the lungs 2 (two) times daily. 10.7 g 6  . Cholecalciferol (VITAMIN D3) 50 MCG (2000 UT) capsule Take 1 capsule (2,000 Units total) by mouth daily with lunch. 30 capsule 1  .  ELIQUIS 5 MG TABS tablet TAKE 1 TABLET BY MOUTH TWICE DAILY 60 tablet 5  . famotidine (PEPCID) 20 MG tablet Take 20 mg by mouth 2 (two) times daily.    . fenofibrate (TRICOR) 145 MG tablet TAKE 1 TABLET(145 MG) BY MOUTH DAILY 90 tablet 1  . folic acid (FOLVITE) 1 MG tablet Take 1 mg by mouth daily.    Marland Kitchen gabapentin (NEURONTIN) 100 MG capsule Take 100 mg by mouth 3 (three) times daily.    Marland Kitchen ipratropium-albuterol (DUONEB) 0.5-2.5 (3) MG/3ML SOLN Take 3 mLs by nebulization every 6 (six) hours as needed. 360 mL 11  . lidocaine-prilocaine (EMLA) cream Apply to affected area once 30 g 3  . methotrexate 2.5 MG tablet Take 12.5 mg by mouth every Sunday.     . ondansetron (ZOFRAN) 8 MG tablet Take 1 tablet (8 mg total) by mouth 2 (two) times daily as needed for refractory nausea / vomiting. Start on day 3 after carboplatin chemo. 30 tablet 1  . sodium chloride (OCEAN) 0.65 % SOLN nasal spray Place 1 spray into both nostrils at bedtime as needed for congestion.     . traMADol (ULTRAM) 50 MG tablet Take 25 mg by mouth at bedtime as needed for moderate pain.     Marland Kitchen diltiazem (CARDIZEM) 30 MG tablet Take one tablet by mouth every 4 hours as needed for heart rate greater than 100 and top number blood pressure needs to be greater than 100 30 tablet 1   No current facility-administered medications for this encounter.    Allergies  Allergen Reactions  . Sulfa Antibiotics Hives and Itching  . Penicillins Hives and Palpitations    Did it involve swelling of the face/tongue/throat, SOB, or low BP? No Did it involve sudden or severe rash/hives, skin peeling, or any reaction on the inside of your mouth or nose? No Did you need to seek medical attention at a hospital or doctor's office? Unknown When did it last happen?childhood allergy If all above answers are "NO", may proceed with cephalosporin use.     Social History   Socioeconomic History  . Marital status: Married    Spouse name: Not on file  .  Number of children: 2  . Years of education: Not on file  . Highest education level: Not on file  Occupational History  . Occupation: Retired  Tobacco Use  . Smoking status: Former Smoker    Packs/day: 1.00    Years: 60.00    Pack years: 60.00    Types: Cigarettes    Quit date: 04/29/2017    Years since quitting: 2.4  . Smokeless tobacco: Never Used  Vaping Use  . Vaping Use: Never used  Substance and Sexual Activity  . Alcohol use: Yes    Alcohol/week: 1.0  standard drink    Types: 1 Standard drinks or equivalent per week    Comment: having one drink a day- Vodka with peach  . Drug use: No  . Sexual activity: Never  Other Topics Concern  . Not on file  Social History Narrative   Married.   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      Does not have a living will.   Desires CPR, does not want prolonged life support if futile.   Social Determinants of Health   Financial Resource Strain: Low Risk   . Difficulty of Paying Living Expenses: Not hard at all  Food Insecurity: No Food Insecurity  . Worried About Charity fundraiser in the Last Year: Never true  . Ran Out of Food in the Last Year: Never true  Transportation Needs: No Transportation Needs  . Lack of Transportation (Medical): No  . Lack of Transportation (Non-Medical): No  Physical Activity: Sufficiently Active  . Days of Exercise per Week: 7 days  . Minutes of Exercise per Session: 60 min  Stress: No Stress Concern Present  . Feeling of Stress : Not at all  Social Connections:   . Frequency of Communication with Friends and Family: Not on file  . Frequency of Social Gatherings with Friends and Family: Not on file  . Attends Religious Services: Not on file  . Active Member of Clubs or Organizations: Not on file  . Attends Archivist Meetings: Not on file  . Marital Status: Not on file  Intimate Partner Violence: Not At Risk  . Fear of Current or Ex-Partner: No  . Emotionally Abused: No    . Physically Abused: No  . Sexually Abused: No    Family History  Problem Relation Age of Onset  . Lung cancer Mother   . Heart disease Father   . Alzheimer's disease Father     ROS- All systems are reviewed and negative except as per the HPI above  Physical Exam: Vitals:   10/20/19 0924  BP: 112/70  Pulse: (!) 146  SpO2: 95%  Weight: 72.6 kg  Height: 5\' 6"  (1.676 m)   Wt Readings from Last 3 Encounters:  10/20/19 72.6 kg  10/10/19 73.5 kg  10/07/19 73.5 kg    Labs: Lab Results  Component Value Date   NA 136 09/28/2019   K 5.3 (H) 09/28/2019   CL 103 09/28/2019   CO2 25 09/28/2019   GLUCOSE 148 (H) 09/28/2019   BUN 25 (H) 09/28/2019   CREATININE 1.06 09/28/2019   CALCIUM 9.1 09/28/2019   PHOS 3.6 09/28/2019   MG 1.9 09/28/2019   Lab Results  Component Value Date   INR 1.0 05/27/2019   Lab Results  Component Value Date   CHOL 154 03/13/2019   HDL 50.90 03/13/2019   LDLCALC 86 03/13/2019   TRIG 89.0 03/13/2019     GEN- The patient is well appearing, alert and oriented x 3 today.   Head- normocephalic, atraumatic Eyes-  Sclera clear, conjunctiva pink Ears- hearing intact Oropharynx- clear Neck- supple, no JVP Lymph- no cervical lymphadenopathy Lungs- Clear to ausculation bilaterally, normal work of breathing Heart- Rapid egular rate and rhythm, no murmurs, rubs or gallops, PMI not laterally displaced GI- soft, NT, ND, + BS Extremities- no clubbing, cyanosis, or edema MS- no significant deformity or atrophy Skin- no rash or lesion Psych- euthymic mood, full affect Neuro- strength and sensation are intact  EKG-afib at 146 bpm, qrs int 76 ms, qtc 408  ms     Assessment and Plan: 1. Persistent  afib with RVR since 8/16 Trigger may have been COPD exacerbation with steroid taper  He is tolerating well, does not feel any different Replaced cardizem 30 mg RX and discussed how to take to try to keep HR down Risk vrs benefit of cardioversion  discussed and they do want to proceed,  but want this at set up at  [facility] due to driving out of town difficult for the wife.  We will also request f/u with [name] after cardioversion  Cbc/bmet/covid testing  He has had both vaccines   2. CHA2DS2VASc score of at least 2 States no missed doses for at least 3 weeks   Darryl White, [facility] [address] [place], [place] [address] [phone]

## 2019-10-20 NOTE — H&P (View-Only) (Signed)
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 male with a h/o PPM, sever bullous COPD, rt lung cancer. S/p concurrent chemoradiation, RA on metohotrexate paroxysmal afib ,Lung CA, on eliquis 5 mg bid for a CHA2DS2VASc score of at least 2, that is in the afib clinic for persistent  onset of afib since 8/16. Of note, he was admitted for 8/6 to 8/8 for COPD exacerbation and treated with prednisone and a z pak. He was also thought to have had an component of interstitial  process/pnuemonits, possibly contributed to by methotrexate.  In the clinic, he has RVR but he does not feel any different. States he is short of breath all the time. The wife gave him  a 30 mg Cardizem last night and it did bring down his HR but RX has expired . I will  send in new refill.   We discussed cardioversion today and he is in favor but the wife's nerves were "torn up" driving  into [name] and they want care out of [name]. He is a patient of Dr. Olin White  and sees him regularly there.   Today, he denies symptoms of palpitations, chest pain, shortness of breath, orthopnea, PND, lower extremity edema, dizziness, presyncope, syncope, or neurologic sequela. The patient is tolerating medications without difficulties and is otherwise without complaint today.   Past Medical History:  Diagnosis Date  . Arthritis 2019   dx of osteoarthritis and rhemautoid arthritis  . Cancer ([place])    lung  . Cataract 2019   left eye  . Chicken pox   . Collagen vascular disease ([place])   . 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 COPD (chronic obstructive pulmonary disease) ([place])   . Diverticulitis   . GERD (gastroesophageal reflux disease)   . 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 History of hiatal hernia   . History of stomach ulcers   . Hyperlipidemia   . Lymphocytic colitis   . Orthostatic hypotension   .  Osteoarthritis   . Pancreatitis   . Paroxysmal atrial fibrillation (HCC)   . Presence of permanent cardiac pacemaker   . 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 APPENDECTOMY    . BACK SURGERY     bone chip lasered  . CARDIAC CATHETERIZATION    . CATARACT EXTRACTION W/ INTRAOCULAR LENS IMPLANT Right   . COLON SURGERY  1990's   12" removed of large intestine  . EYE SURGERY    . INSERT / REPLACE / [place]  . large intestine block    . PACEMAKER INSERTION     MDT dual chamber pacemaker  . PORTA CATH INSERTION N/A 04/17/2019   Procedure: PORTA CATH INSERTION;  Surgeon: Darryl Huxley, MD;  Location: [place] [facility];  Service: Cardiovascular;  Laterality: N/A;  . PPM GENERATOR CHANGEOUT N/A 12/02/2018   Procedure: PPM GENERATOR CHANGEOUT;  Surgeon: Darryl Sprang, MD;  Location: [place] [facility];  Service: Cardiovascular;  Laterality: N/A;  . TONSILLECTOMY AND ADENOIDECTOMY    . VIDEO BRONCHOSCOPY WITH ENDOBRONCHIAL ULTRASOUND N/A 04/07/2019   Procedure: VIDEO BRONCHOSCOPY WITH ENDOBRONCHIAL ULTRASOUND;  Surgeon: Darryl Pita, MD;  Location: [facility] ORS;  Service: Pulmonary;  Laterality: N/A;    Current Outpatient Medications  Medication Sig Dispense Refill  .  acetaminophen (ACETAMINOPHEN 8 HOUR) 650 MG CR tablet Take 650 mg by mouth every 8 (eight) hours as needed for pain.    Marland Kitchen albuterol (PROAIR HFA) 108 (90 Base) MCG/ACT inhaler Inhale 1-2 puffs into the lungs every 6 (six) hours as needed for wheezing or shortness of breath. 18 g 5  . brimonidine (ALPHAGAN) 0.15 % ophthalmic solution Place 1 drop into both eyes 2 (two) times daily.     . Budeson-Glycopyrrol-Formoterol (BREZTRI AEROSPHERE) 160-9-4.8 MCG/ACT AERO Inhale 2 puffs into the lungs 2 (two) times daily. 10.7 g 6  . Cholecalciferol (VITAMIN D3) 50 MCG (2000 UT) capsule Take 1 capsule (2,000 Units total) by mouth daily with lunch. 30 capsule 1  .  ELIQUIS 5 MG TABS tablet TAKE 1 TABLET BY MOUTH TWICE DAILY 60 tablet 5  . famotidine (PEPCID) 20 MG tablet Take 20 mg by mouth 2 (two) times daily.    . fenofibrate (TRICOR) 145 MG tablet TAKE 1 TABLET(145 MG) BY MOUTH DAILY 90 tablet 1  . folic acid (FOLVITE) 1 MG tablet Take 1 mg by mouth daily.    Marland Kitchen gabapentin (NEURONTIN) 100 MG capsule Take 100 mg by mouth 3 (three) times daily.    Marland Kitchen ipratropium-albuterol (DUONEB) 0.5-2.5 (3) MG/3ML SOLN Take 3 mLs by nebulization every 6 (six) hours as needed. 360 mL 11  . lidocaine-prilocaine (EMLA) cream Apply to affected area once 30 g 3  . methotrexate 2.5 MG tablet Take 12.5 mg by mouth every Sunday.     . ondansetron (ZOFRAN) 8 MG tablet Take 1 tablet (8 mg total) by mouth 2 (two) times daily as needed for refractory nausea / vomiting. Start on day 3 after carboplatin chemo. 30 tablet 1  . sodium chloride (OCEAN) 0.65 % SOLN nasal spray Place 1 spray into both nostrils at bedtime as needed for congestion.     . traMADol (ULTRAM) 50 MG tablet Take 25 mg by mouth at bedtime as needed for moderate pain.     Marland Kitchen diltiazem (CARDIZEM) 30 MG tablet Take one tablet by mouth every 4 hours as needed for heart rate greater than 100 and top number blood pressure needs to be greater than 100 30 tablet 1   No current facility-administered medications for this encounter.    Allergies  Allergen Reactions  . Sulfa Antibiotics Hives and Itching  . Penicillins Hives and Palpitations    Did it involve swelling of the face/tongue/throat, SOB, or low BP? No Did it involve sudden or severe rash/hives, skin peeling, or any reaction on the inside of your mouth or nose? No Did you need to seek medical attention at a hospital or doctor's office? Unknown When did it last happen?childhood allergy If all above answers are "NO", may proceed with cephalosporin use.     Social History   Socioeconomic History  . Marital status: Married    Spouse name: Not on file  .  Number of children: 2  . Years of education: Not on file  . Highest education level: Not on file  Occupational History  . Occupation: Retired  Tobacco Use  . Smoking status: Former Smoker    Packs/day: 1.00    Years: 60.00    Pack years: 60.00    Types: Cigarettes    Quit date: 04/29/2017    Years since quitting: 2.4  . Smokeless tobacco: Never Used  Vaping Use  . Vaping Use: Never used  Substance and Sexual Activity  . Alcohol use: Yes    Alcohol/week: 1.0  standard drink    Types: 1 Standard drinks or equivalent per week    Comment: having one drink a day- Vodka with peach  . Drug use: No  . Sexual activity: Never  Other Topics Concern  . Not on file  Social History Narrative   Married.   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      Does not have a living will.   Desires CPR, does not want prolonged life support if futile.   Social Determinants of Health   Financial Resource Strain: Low Risk   . Difficulty of Paying Living Expenses: Not hard at all  Food Insecurity: No Food Insecurity  . Worried About Charity fundraiser in the Last Year: Never true  . Ran Out of Food in the Last Year: Never true  Transportation Needs: No Transportation Needs  . Lack of Transportation (Medical): No  . Lack of Transportation (Non-Medical): No  Physical Activity: Sufficiently Active  . Days of Exercise per Week: 7 days  . Minutes of Exercise per Session: 60 min  Stress: No Stress Concern Present  . Feeling of Stress : Not at all  Social Connections:   . Frequency of Communication with Friends and Family: Not on file  . Frequency of Social Gatherings with Friends and Family: Not on file  . Attends Religious Services: Not on file  . Active Member of Clubs or Organizations: Not on file  . Attends Archivist Meetings: Not on file  . Marital Status: Not on file  Intimate Partner Violence: Not At Risk  . Fear of Current or Ex-Partner: No  . Emotionally Abused: No    . Physically Abused: No  . Sexually Abused: No    Family History  Problem Relation Age of Onset  . Lung cancer Mother   . Heart disease Father   . Alzheimer's disease Father     ROS- All systems are reviewed and negative except as per the HPI above  Physical Exam: Vitals:   10/20/19 0924  BP: 112/70  Pulse: (!) 146  SpO2: 95%  Weight: 72.6 kg  Height: 5\' 6"  (1.676 m)   Wt Readings from Last 3 Encounters:  10/20/19 72.6 kg  10/10/19 73.5 kg  10/07/19 73.5 kg    Labs: Lab Results  Component Value Date   NA 136 09/28/2019   K 5.3 (H) 09/28/2019   CL 103 09/28/2019   CO2 25 09/28/2019   GLUCOSE 148 (H) 09/28/2019   BUN 25 (H) 09/28/2019   CREATININE 1.06 09/28/2019   CALCIUM 9.1 09/28/2019   PHOS 3.6 09/28/2019   MG 1.9 09/28/2019   Lab Results  Component Value Date   INR 1.0 05/27/2019   Lab Results  Component Value Date   CHOL 154 03/13/2019   HDL 50.90 03/13/2019   LDLCALC 86 03/13/2019   TRIG 89.0 03/13/2019     GEN- The patient is well appearing, alert and oriented x 3 today.   Head- normocephalic, atraumatic Eyes-  Sclera clear, conjunctiva pink Ears- hearing intact Oropharynx- clear Neck- supple, no JVP Lymph- no cervical lymphadenopathy Lungs- Clear to ausculation bilaterally, normal work of breathing Heart- Rapid egular rate and rhythm, no murmurs, rubs or gallops, PMI not laterally displaced GI- soft, NT, ND, + BS Extremities- no clubbing, cyanosis, or edema MS- no significant deformity or atrophy Skin- no rash or lesion Psych- euthymic mood, full affect Neuro- strength and sensation are intact  EKG-afib at 146 bpm, qrs int 76 ms, qtc 408  ms     Assessment and Plan: 1. Persistent  afib with RVR since 8/16 Trigger may have been COPD exacerbation with steroid taper  He is tolerating well, does not feel any different Replaced cardizem 30 mg RX and discussed how to take to try to keep HR down Risk vrs benefit of cardioversion  discussed and they do want to proceed,  but want this at set up at  [facility] due to driving out of town difficult for the wife.  We will also request f/u with [name] after cardioversion  Cbc/bmet/covid testing  He has had both vaccines   2. CHA2DS2VASc score of at least 2 States no missed doses for at least 3 weeks   Darryl White, [facility] [address] [place], [place] [address] [phone]

## 2019-10-21 ENCOUNTER — Telehealth: Payer: Self-pay | Admitting: *Deleted

## 2019-10-21 NOTE — Progress Notes (Signed)
Remote pacemaker transmission.   

## 2019-10-21 NOTE — Telephone Encounter (Signed)
While scheduling the patient's DCCV his wife mentioned to me that the patient is having lower extremity swelling that is worse today than yesterday when he saw Roderic Palau, NP.  His weight this morning was 159 lbs. He was 162 lbs yesterday at the A-fib clinic per [name]. They have not been weighing daily, but will start doing this.   [name] does have compression hose for the patient and will apply these. I have also advised to elevate his lower extremities when sitting and watch his salt & fluid intake.   She questioned if he should be on a fluid pill. I advised with his SBP at 118 this morning, I will need to review with [name] and call her back.  Reviewed the patient's chart with [name]. Per [name], he would like me to call the patient's wife in the morning and see how his weight/ swelling are doing. If swelling is not improved/ worse, then he can take furosemide 20 mg once daily until his DCCV on Friday.   I have called [name] and advised her of the above.  She is aware I will call her back in the morning to follow up on the patient's symptoms. She voices understanding and is agreeable.

## 2019-10-21 NOTE — Telephone Encounter (Signed)
I called and spoke with the patient and his wife regarding his DCCV. I advised we do have availability this Friday 10/24/19 at [facility] with Dr. Rockey Situ. They are agreeable.  I have gone over the pre-procedure instructions below with the patient's wife and she voices understanding.   You are scheduled for a Cardioversion on Friday 10/24/19 with Dr. Rockey Situ.  Please arrive at the [facility] at 6:30 a.m. on the day of your procedure.  DIET INSTRUCTIONS:  Nothing to eat or drink after midnight the night prior to your procedure         1) Labs:   Pre procedure COVID swab:  - Wednesday 10/22/19 (8:00 am- 1:00 pm) - [facility] entrance>> drive up test only  2) Medications:  You may take all of your regular medications the morning of your procedure with enough water to get them down safely   3) Must have a responsible person to drive you home.  4) Bring a current list of your medications and current insurance cards.    If you have any questions after you get home, please call the office at [phone]

## 2019-10-21 NOTE — Telephone Encounter (Signed)
-----   Message from Juluis Mire, RN sent at 10/20/2019 10:26 AM EDT ----- Regarding: cardioversion and follow up wtih Dr. Daisy Blossom --   Butch Penny saw this patient today - wife is requesting pt have cardioversion in [employer] (she does not want to drive back to [facility]) and follow up with [name] post cardioversion. Can you help set the cardioversion up in [place].  Pt had labs drawn today at office. Let me know if I can help with anything else. ThanksStacy

## 2019-10-22 ENCOUNTER — Other Ambulatory Visit: Payer: Self-pay

## 2019-10-22 ENCOUNTER — Other Ambulatory Visit
Admission: RE | Admit: 2019-10-22 | Discharge: 2019-10-22 | Disposition: A | Discharge status: Home / Self Care | Payer: Medicare PPO | Source: Ambulatory Visit | Attending: Internal Medicine | Admitting: Internal Medicine

## 2019-10-22 DIAGNOSIS — Z20822 Contact with and (suspected) exposure to covid-19: Secondary | ICD-10-CM | POA: Diagnosis not present

## 2019-10-22 DIAGNOSIS — Z01812 Encounter for preprocedural laboratory examination: Secondary | ICD-10-CM | POA: Insufficient documentation

## 2019-10-22 LAB — SARS CORONAVIRUS 2 (TAT 6-24 HRS): SARS Coronavirus 2: NEGATIVE

## 2019-10-22 NOTE — Telephone Encounter (Signed)
I called and spoke with the patient and his wife to follow up on his swelling today. Per Mr. Cambria, the patient's weight was down ~ 1/4 lb. His swelling was some improved this morning, but does seem to worsen as the day goes on.   BP (HR) readings today: 125/100 (105) 145/109 (126) 120/75 (128)  The patient has not taken a diltiazem this morning. I have advised Mrs. Adamcik that the patient should take a diltiazem this morning, but I do not think he needs the lasix today.  I have advised her that in the morning, if his HR is still elevated and SBP > 100, he should take another diltiazem. If his swelling is worse, or his weight is up, he should wait ~ 2 hours after the diltiazem dose, and check his BP again. If his SBP is still > 100, he may take a lasix 20 mg x 1 dose- the patient's wife has these at home already.   I have advised that I think he will see some improvement in his swelling once his DCCV takes place. They are aware to call me back prior to Friday if needed. The patient/ his voice understanding and are agreeable.

## 2019-10-23 ENCOUNTER — Other Ambulatory Visit (HOSPITAL_COMMUNITY): Payer: Self-pay | Admitting: *Deleted

## 2019-10-24 ENCOUNTER — Other Ambulatory Visit: Payer: Self-pay

## 2019-10-24 ENCOUNTER — Ambulatory Visit: Payer: Medicare PPO | Admitting: Anesthesiology

## 2019-10-24 ENCOUNTER — Encounter
Admission: RE | Disposition: A | Discharge status: Home / Self Care | Payer: Self-pay | Source: Home / Self Care | Attending: Cardiovascular Disease

## 2019-10-24 ENCOUNTER — Encounter: Payer: Self-pay | Admitting: Cardiovascular Disease

## 2019-10-24 ENCOUNTER — Ambulatory Visit
Admission: RE | Admit: 2019-10-24 | Discharge: 2019-10-24 | Disposition: A | Discharge status: Home / Self Care | Payer: Medicare PPO | Attending: Cardiovascular Disease | Admitting: Cardiovascular Disease

## 2019-10-24 DIAGNOSIS — Z880 Allergy status to penicillin: Secondary | ICD-10-CM | POA: Diagnosis not present

## 2019-10-24 DIAGNOSIS — Z923 Personal history of irradiation: Secondary | ICD-10-CM | POA: Insufficient documentation

## 2019-10-24 DIAGNOSIS — M1990 Unspecified osteoarthritis, unspecified site: Secondary | ICD-10-CM | POA: Insufficient documentation

## 2019-10-24 DIAGNOSIS — I442 Atrioventricular block, complete: Secondary | ICD-10-CM | POA: Insufficient documentation

## 2019-10-24 DIAGNOSIS — E785 Hyperlipidemia, unspecified: Secondary | ICD-10-CM | POA: Diagnosis not present

## 2019-10-24 DIAGNOSIS — Z882 Allergy status to sulfonamides status: Secondary | ICD-10-CM | POA: Diagnosis not present

## 2019-10-24 DIAGNOSIS — I480 Paroxysmal atrial fibrillation: Secondary | ICD-10-CM | POA: Diagnosis not present

## 2019-10-24 DIAGNOSIS — C3491 Malignant neoplasm of unspecified part of right bronchus or lung: Secondary | ICD-10-CM | POA: Diagnosis not present

## 2019-10-24 DIAGNOSIS — K219 Gastro-esophageal reflux disease without esophagitis: Secondary | ICD-10-CM | POA: Insufficient documentation

## 2019-10-24 DIAGNOSIS — M069 Rheumatoid arthritis, unspecified: Secondary | ICD-10-CM | POA: Insufficient documentation

## 2019-10-24 DIAGNOSIS — Z87891 Personal history of nicotine dependence: Secondary | ICD-10-CM | POA: Insufficient documentation

## 2019-10-24 DIAGNOSIS — Z7951 Long term (current) use of inhaled steroids: Secondary | ICD-10-CM | POA: Insufficient documentation

## 2019-10-24 DIAGNOSIS — Z79899 Other long term (current) drug therapy: Secondary | ICD-10-CM | POA: Diagnosis not present

## 2019-10-24 DIAGNOSIS — Z7901 Long term (current) use of anticoagulants: Secondary | ICD-10-CM | POA: Diagnosis not present

## 2019-10-24 DIAGNOSIS — I4819 Other persistent atrial fibrillation: Secondary | ICD-10-CM | POA: Diagnosis not present

## 2019-10-24 DIAGNOSIS — J449 Chronic obstructive pulmonary disease, unspecified: Secondary | ICD-10-CM | POA: Diagnosis not present

## 2019-10-24 DIAGNOSIS — Z950 Presence of cardiac pacemaker: Secondary | ICD-10-CM | POA: Insufficient documentation

## 2019-10-24 HISTORY — PX: CARDIOVERSION: SHX1299

## 2019-10-24 SURGERY — CARDIOVERSION
Anesthesia: General

## 2019-10-24 MED ORDER — SODIUM CHLORIDE 0.9 % IV SOLN
INTRAVENOUS | Status: DC
  Administered 2019-10-24: 1000 mL via INTRAVENOUS

## 2019-10-24 MED ORDER — PROPOFOL 10 MG/ML IV BOLUS
INTRAVENOUS | Status: DC | PRN
  Administered 2019-10-24: 60 mg via INTRAVENOUS

## 2019-10-24 MED ORDER — PHENYLEPHRINE HCL (PRESSORS) 10 MG/ML IV SOLN
INTRAVENOUS | Status: DC | PRN
  Administered 2019-10-24: 100 ug via INTRAVENOUS

## 2019-10-24 MED ORDER — PROPOFOL 10 MG/ML IV BOLUS
INTRAVENOUS | Status: AC
  Filled 2019-10-24: qty 20

## 2019-10-24 NOTE — CV Procedure (Signed)
Cardioversion procedure note For atrial fibrillation, persistent  Procedure Details:  Consent: Risks of procedure as well as the alternatives and risks of each were explained to the (patient/caregiver). Consent for procedure obtained.  Time Out: Verified patient identification, verified procedure, site/side was marked, verified correct patient position, special equipment/implants available, medications/allergies/relevent history reviewed, required imaging and test results available. Performed  Patient placed on cardiac monitor, pulse oximetry, supplemental oxygen as necessary.  Sedation given: propofol IV, Dr. Lubertha Basque Pacer pads placed anterior and posterior chest.   Cardioverted 1 time(s).  Cardioverted at  150 J. Synchronized biphasic Converted to NSR   Evaluation: Findings: Post procedure EKG shows: NSR Complications: None Patient did tolerate procedure well.  Time Spent Directly with the Patient:  62 minutes   Esmond Plants, M.D., Ph.D.

## 2019-10-24 NOTE — Anesthesia Preprocedure Evaluation (Addendum)
Anesthesia Evaluation  Patient identified by MRN, date of birth, ID band Patient awake    Reviewed: Allergy & Precautions, H&P , NPO status , Patient's Chart, lab work & pertinent test results  History of Anesthesia Complications Negative for: history of anesthetic complications  Airway Mallampati: II  TM Distance: >3 FB Neck ROM: full    Dental  (+) Chipped   Pulmonary neg sleep apnea, COPD, former smoker,  Lung CA   breath sounds clear to auscultation + decreased breath sounds(-) wheezing      Cardiovascular (-) angina(-) Past MI and (-) Cardiac Stents + dysrhythmias Atrial Fibrillation + pacemaker  Rhythm:irregular Rate:Tachycardia     Neuro/Psych negative neurological ROS  negative psych ROS   GI/Hepatic Neg liver ROS, hiatal hernia, GERD  ,  Endo/Other  negative endocrine ROS  Renal/GU negative Renal ROS  negative genitourinary   Musculoskeletal  (+) Arthritis ,   Abdominal   Peds  Hematology Anticoagulated on Eliquis   Anesthesia Other Findings Past Medical History: 2019: Arthritis     Comment:  dx of osteoarthritis and rhemautoid arthritis No date: Cancer [facility])     Comment:  lung 2019: Cataract     Comment:  left eye No date: Chicken pox No date: Collagen vascular disease (HCC) No date: Complete heart block (HCC)     Comment:  a. s/p MDT dual chamber PPM No date: COPD (chronic obstructive pulmonary disease) (HCC) No date: Diverticulitis No date: GERD (gastroesophageal reflux disease) 2015: Glaucoma     Comment:  bilateral eyes No date: History of hiatal hernia No date: History of stomach ulcers No date: Hyperlipidemia No date: Lymphocytic colitis No date: Orthostatic hypotension No date: Osteoarthritis No date: Pancreatitis No date: Paroxysmal atrial fibrillation (HCC) No date: Presence of permanent cardiac pacemaker 04/15/2019: Small cell lung cancer ([place])  Past Surgical History: No date:  APPENDECTOMY No date: BACK SURGERY     Comment:  bone chip lasered No date: CARDIAC CATHETERIZATION No date: CATARACT EXTRACTION W/ INTRAOCULAR LENS IMPLANT; Right 1990's: COLON SURGERY     Comment:  12" removed of large intestine No date: EYE SURGERY 1993: INSERT / REPLACE / REMOVE PACEMAKER No date: large intestine block No date: PACEMAKER INSERTION     Comment:  MDT dual chamber pacemaker 04/17/2019: PORTA CATH INSERTION; N/A     Comment:  Procedure: PORTA CATH INSERTION;  Surgeon: Algernon Huxley,              MD;  Location: [place] [facility];  Service:               Cardiovascular;  Laterality: N/A; 12/02/2018: PPM GENERATOR CHANGEOUT; N/A     Comment:  Procedure: PPM GENERATOR CHANGEOUT;  Surgeon: Deboraha Sprang, MD;  Location: [place] [facility];  Service:               Cardiovascular;  Laterality: N/A; No date: TONSILLECTOMY AND ADENOIDECTOMY 04/07/2019: VIDEO BRONCHOSCOPY WITH ENDOBRONCHIAL ULTRASOUND; N/A     Comment:  Procedure: VIDEO BRONCHOSCOPY WITH ENDOBRONCHIAL               ULTRASOUND;  Surgeon: Tyler Pita, MD;  Location:               [facility] ORS;  Service: Pulmonary;  Laterality: N/A;  BMI    Body Mass Index: 25.82 kg/m      Reproductive/Obstetrics negative OB ROS  Anesthesia Physical Anesthesia Plan  ASA: III  Anesthesia Plan: General   Post-op Pain Management:    Induction:   PONV Risk Score and Plan:   Airway Management Planned: Natural Airway and Nasal Cannula  Additional Equipment:   Intra-op Plan:   Post-operative Plan:   Informed Consent: I have reviewed the patients History and Physical, chart, labs and discussed the procedure including the risks, benefits and alternatives for the proposed anesthesia with the patient or authorized representative who has indicated his/her understanding and acceptance.     Dental Advisory Given  Plan Discussed with:  Anesthesiologist, CRNA and Surgeon  Anesthesia Plan Comments:         Anesthesia Quick Evaluation

## 2019-10-24 NOTE — Anesthesia Postprocedure Evaluation (Signed)
Anesthesia Post Note  Patient: Darryl White  Procedure(s) Performed: CARDIOVERSION (N/A )  Patient location during evaluation: PACU Anesthesia Type: General Level of consciousness: awake and alert Pain management: pain level controlled Vital Signs Assessment: post-procedure vital signs reviewed and stable Respiratory status: spontaneous breathing, nonlabored ventilation and respiratory function stable Cardiovascular status: blood pressure returned to baseline and stable Postop Assessment: no apparent nausea or vomiting Anesthetic complications: no   No complications documented.   Last Vitals:  Vitals:   10/24/19 0752 10/24/19 0800  BP: 97/71 100/71  Pulse: 92 95  Resp: (!) 22 (!) 26  Temp:    SpO2: 97% 95%    Last Pain:  Vitals:   10/24/19 0704  TempSrc: Oral  PainSc: 0-No pain                 Brett Canales Rourke Mcquitty

## 2019-10-24 NOTE — Transfer of Care (Signed)
Immediate Anesthesia Transfer of Care Note  Patient: Darryl White  Procedure(s) Performed: CARDIOVERSION (N/A )  Patient Location: PACU and specials  Anesthesia Type:General  Level of Consciousness: awake, alert  and drowsy  Airway & Oxygen Therapy: Patient Spontanous Breathing and Patient connected to nasal cannula oxygen  Post-op Assessment: Report given to RN and Post -op Vital signs reviewed and stable  Post vital signs: Reviewed and stable  Last Vitals:  Vitals Value Taken Time  BP 81/65 10/24/19 0745  Temp    Pulse 92 10/24/19 0747  Resp 18 10/24/19 0747  SpO2 99 % 10/24/19 0747    Last Pain:  Vitals:   10/24/19 0704  TempSrc: Oral  PainSc: 0-No pain         Complications: No complications documented.

## 2019-10-27 ENCOUNTER — Telehealth: Payer: Self-pay | Admitting: Physician Assistant

## 2019-10-27 NOTE — Interval H&P Note (Signed)
History and Physical Interval Note:  10/27/2019 3:54 PM  Darryl White  has presented today for surgery, with the diagnosis of Cardioversion   Afib.  The various methods of treatment have been discussed with the patient and family. After consideration of risks, benefits and other options for treatment, the patient has consented to  Procedure(s): CARDIOVERSION (N/A) as a surgical intervention.  The patient's history has been reviewed, patient examined, no change in status, stable for surgery.  I have reviewed the patient's chart and labs.  Questions were answered to the patient's satisfaction.     Ida Rogue

## 2019-10-27 NOTE — Telephone Encounter (Signed)
Patient has gained "fluids" per wife. Patient is complaining of shortness of breath, orthopnea and LE edema. HR of 80s at rest which goes to 100s with ambulation and return to normal at rest with in few minutes. No chest pain.   Patient already got lasix 20mg  this morning. I have advised to keep monitor the symptoms. If worsen, call EMS or go to ER.   She will call us in AM to seek further guidance if no improvement but stable.

## 2019-10-27 NOTE — H&P (Signed)
H&P Addendum, pre-cardioversion  Patient was seen and evaluated prior to -cardioversion procedure Symptoms, prior testing details again confirmed with the patient Patient examined, no significant change from prior exam Lab work reviewed in detail personally by myself Patient understands risk and benefit of the procedure, willing to proceed  Signed, Esmond Plants, MD, Ph.D [facility]

## 2019-10-28 ENCOUNTER — Telehealth: Payer: Self-pay | Admitting: Internal Medicine

## 2019-10-28 ENCOUNTER — Ambulatory Visit: Payer: Medicare PPO

## 2019-10-28 ENCOUNTER — Other Ambulatory Visit: Payer: Self-pay

## 2019-10-28 ENCOUNTER — Inpatient Hospital Stay: Payer: Medicare PPO | Attending: Oncology

## 2019-10-28 DIAGNOSIS — C3490 Malignant neoplasm of unspecified part of unspecified bronchus or lung: Secondary | ICD-10-CM | POA: Insufficient documentation

## 2019-10-28 LAB — CBC WITH DIFFERENTIAL/PLATELET
Abs Immature Granulocytes: 0.07 10*3/uL (ref 0.00–0.07)
Basophils Absolute: 0 10*3/uL (ref 0.0–0.1)
Basophils Relative: 0 %
Eosinophils Absolute: 0.3 10*3/uL (ref 0.0–0.5)
Eosinophils Relative: 4 %
HCT: 37.7 % — ABNORMAL LOW (ref 39.0–52.0)
Hemoglobin: 13.5 g/dL (ref 13.0–17.0)
Immature Granulocytes: 1 %
Lymphocytes Relative: 7 %
Lymphs Abs: 0.6 10*3/uL — ABNORMAL LOW (ref 0.7–4.0)
MCH: 36.2 pg — ABNORMAL HIGH (ref 26.0–34.0)
MCHC: 35.8 g/dL (ref 30.0–36.0)
MCV: 101.1 fL — ABNORMAL HIGH (ref 80.0–100.0)
Monocytes Absolute: 0.5 10*3/uL (ref 0.1–1.0)
Monocytes Relative: 6 %
Neutro Abs: 6.3 10*3/uL (ref 1.7–7.7)
Neutrophils Relative %: 82 %
Platelets: 192 10*3/uL (ref 150–400)
RBC: 3.73 MIL/uL — ABNORMAL LOW (ref 4.22–5.81)
RDW: 16.2 % — ABNORMAL HIGH (ref 11.5–15.5)
WBC: 7.7 10*3/uL (ref 4.0–10.5)
nRBC: 0 % (ref 0.0–0.2)

## 2019-10-28 LAB — COMPREHENSIVE METABOLIC PANEL
ALT: 39 U/L (ref 0–44)
AST: 65 U/L — ABNORMAL HIGH (ref 15–41)
Albumin: 2.8 g/dL — ABNORMAL LOW (ref 3.5–5.0)
Alkaline Phosphatase: 141 U/L — ABNORMAL HIGH (ref 38–126)
Anion gap: 7 (ref 5–15)
BUN: 18 mg/dL (ref 8–23)
CO2: 27 mmol/L (ref 22–32)
Calcium: 8.4 mg/dL — ABNORMAL LOW (ref 8.9–10.3)
Chloride: 97 mmol/L — ABNORMAL LOW (ref 98–111)
Creatinine, Ser: 0.85 mg/dL (ref 0.61–1.24)
GFR calc Af Amer: >60 mL/min (ref >60)
GFR calc non Af Amer: >60 mL/min (ref >60)
Glucose, Bld: 133 mg/dL — ABNORMAL HIGH (ref 70–99)
Potassium: 3.8 mmol/L (ref 3.5–5.1)
Sodium: 131 mmol/L — ABNORMAL LOW (ref 135–145)
Total Bilirubin: 0.6 mg/dL (ref 0.3–1.2)
Total Protein: 5.9 g/dL — ABNORMAL LOW (ref 6.5–8.1)

## 2019-10-28 MED ORDER — HEPARIN SOD (PORK) LOCK FLUSH 100 UNIT/ML IV SOLN
INTRAVENOUS | Status: AC
  Filled 2019-10-28: qty 5

## 2019-10-28 MED ORDER — HEPARIN SOD (PORK) LOCK FLUSH 100 UNIT/ML IV SOLN
500.0000 [IU] | Freq: Once | INTRAVENOUS | Status: AC
  Administered 2019-10-28: 500 [IU] via INTRAVENOUS
  Filled 2019-10-28: qty 5

## 2019-10-28 MED ORDER — SODIUM CHLORIDE 0.9% FLUSH
10.0000 mL | Freq: Once | INTRAVENOUS | Status: AC
  Administered 2019-10-28: 10 mL via INTRAVENOUS
  Filled 2019-10-28: qty 10

## 2019-10-28 NOTE — Telephone Encounter (Signed)
Pt c/o swelling: STAT is pt has developed SOB within 24 hours  1) How much weight have you gained and in what time span?  Not sure   2) If swelling, where is the swelling located?  BLE ankles hands   3) Are you currently taking a fluid pill? Yes per on call took lasix see notes  4) Are you currently SOB? Yes but has copd and this is chronic interim on exertion unsure if increased in frequency   5) Do you have a log of your daily weights (if so, list)? Maintaining weight but notes poor appetite everything tastes bad drinks boost eats jello donut   6) Have you gained 3 pounds in a day or 5 pounds in a week? Not sure due to appetite changes   7) Have you traveled recently? No   Has oncology appt will leave home at 845 and be home by 10

## 2019-10-28 NOTE — Telephone Encounter (Signed)
I spoke with the patient's wife.  She states that the patient is having continued lower extremity swelling since his DCCV was done.  He spoke with the on call provider yesterday and was instructed to take lasix 20 mg x 1 dose yesterday.  Per Mrs. Llera, the patient did have increased urine output on this dose. His has only gained 0.4 lb overnight. His swelling is the same as it was prior to his DCCV on Friday.  HR readings have been up to the 130s with ambulation and down to the 70-80's with sitting.  He has not been consistently high.   They have not been checking his BP at home.  He has not had any PRN diltiazem.  Per Mrs. [place], the patient is not eating much as he has no appetite.  I have advised Mrs. Peatross that the patient may take lasix 20 mg x 1 dose today and repeat tomorrow x 1 dose as needed. I have also advised that we see the patient back in the office this week with [name]. The patient has been scheduled to see [name] on 10/30/19 at 9:40 am.   We will reassess for a-fib at that time. I am uncertain if some of his swelling is due to his low caloric intake vs heart failure from his rapid HR's last week prior to his DCCV.   The patient does have Boost to drink at home and has been encouraged to continue this.   The patient's wife voices understanding and is agreeable.

## 2019-10-30 ENCOUNTER — Other Ambulatory Visit: Payer: Self-pay

## 2019-10-30 ENCOUNTER — Telehealth: Payer: Self-pay | Admitting: Pulmonary Disease

## 2019-10-30 ENCOUNTER — Encounter: Payer: Self-pay | Admitting: Internal Medicine

## 2019-10-30 ENCOUNTER — Ambulatory Visit: Payer: Medicare PPO | Admitting: Oncology

## 2019-10-30 ENCOUNTER — Ambulatory Visit (INDEPENDENT_AMBULATORY_CARE_PROVIDER_SITE_OTHER): Payer: Medicare PPO | Admitting: Internal Medicine

## 2019-10-30 ENCOUNTER — Telehealth: Payer: Self-pay | Admitting: *Deleted

## 2019-10-30 VITALS — BP 104/60 | HR 131 | Ht 66.0 in | Wt 161.4 lb

## 2019-10-30 DIAGNOSIS — I495 Sick sinus syndrome: Secondary | ICD-10-CM | POA: Diagnosis not present

## 2019-10-30 DIAGNOSIS — R600 Localized edema: Secondary | ICD-10-CM | POA: Diagnosis not present

## 2019-10-30 DIAGNOSIS — I480 Paroxysmal atrial fibrillation: Secondary | ICD-10-CM

## 2019-10-30 DIAGNOSIS — Z950 Presence of cardiac pacemaker: Secondary | ICD-10-CM | POA: Diagnosis not present

## 2019-10-30 DIAGNOSIS — Z79899 Other long term (current) drug therapy: Secondary | ICD-10-CM

## 2019-10-30 MED ORDER — FUROSEMIDE 20 MG PO TABS: ORAL_TABLET | ORAL | 1 refills | Status: DC

## 2019-10-30 MED ORDER — FLECAINIDE ACETATE 100 MG PO TABS: 100.0000 mg | ORAL_TABLET | Freq: Two times a day (BID) | ORAL | 3 refills | Status: AC

## 2019-10-30 MED ORDER — DILTIAZEM HCL 30 MG PO TABS: ORAL_TABLET | ORAL | Status: DC

## 2019-10-30 NOTE — Telephone Encounter (Signed)
Spoke with patient and he states that last week he was told he was in a-fib and was cardioverted last Friday. Went and saw [name] today for follow up and was told today that cardioversion worked for about 2 days and is back in a-fib and is going to be cardioverted again next week. He states that he has been using the Duoneb 4 times a day but it is not helping him and feels like it is making him cough more. States that he has a dry cough this afternoon but has at times coughed up white mucus and yellow. Patient would like to cut back on the nebulizer or switch back to [place] he states the [place] never gave him any issues. The office not from 8/17 by Dr. Patsey Berthold states for the patient to do them 4 times a day but the RX that was sent into the pharmacy says 3 times a day every 6 hrs as needed.   Advised patient to cut back to 3 times a day as needed and that I would sent Dr. Patsey Berthold a message to get her recommendations as far as if he can cut back on how much he is using nebulizer or if he can switch back to [place]. Patient expressed understanding.   Dr. Patsey Berthold please advise

## 2019-10-30 NOTE — Progress Notes (Signed)
Patient Care Team: Pleas Koch, NP as PCP - General (Internal Medicine) Deboraha Sprang, MD as Consulting Physician (Cardiology) Marlowe Sax, MD as Referring Physician (Internal Medicine) Birder Robson, MD as Referring Physician (Ophthalmology) Oneta Rack, MD as Referring Physician (Dermatology) Salli Real, DDS as Referring Physician (Dentistry) Telford Nab, RN as Oncology Nurse Navigator Earlie Server, MD as Consulting Physician (Hematology and Oncology)   HPI  Darryl White is a 79 y.o. male Seen in followup for PM implanted 1994 at [facility] for syncope assoc with OI.   Generator replacement 2008 and  10/20  Healed without difficulty    AFib>> [facility] 8/16  . He has had no recurrent syncope, presumably neurally mediated by the family's description   On Anticoagulation w apixoban without bleeding  8/21-COPD exacerbation socially complicated by atrial fibrillation with rapid rates.  No clear impact on dyspnea, but underwent cardioversion  Unfortunately, he reverted within a couple of days.  Continued to struggle with heart failure with dyspnea, orthopnea as well as worsening peripheral edema.  Blood pressure has limited rate control  Date Cr K Hgb  6/18 1.1  14.9  1/19  1.14  15.2  4/20 1.37  13.7  1/21 1.18 4.6 14.7  9/21 0.85 3.8 81.8   Thromboembolic risk factors ( age  -2) for a CHADSVASc Score of 2    DATE TEST EF   8/16 Myoview  65 % No ischemia  9/20 Echo   55-60 %           Lung Ca s/p radiation Therapy now w another lesion noted >>  CT 1/21  "Marked progression of a precarinal lymph node showing central low attenuation suggesting necrosis. Progression highly suspicious for metastatic disease."  PET scan 1/21 demonstrated hypermetabolic adenopathy "significantly worsened compared to prior PET-CT, but roughly similar in size to chest CT from 3 weeks ago." Past Medical History:  Diagnosis Date  . Arthritis 2019   dx of  osteoarthritis and rhemautoid arthritis  . Cancer ([place])    lung  . Cataract 2019   left eye  . Chicken pox   . Collagen vascular disease ([place])   . 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 COPD (chronic obstructive pulmonary disease) ([place])   . Diverticulitis   . GERD (gastroesophageal reflux disease)   . 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 History of hiatal hernia   . History of stomach ulcers   . Hyperlipidemia   . Lymphocytic colitis   . Orthostatic hypotension   . Osteoarthritis   . Pancreatitis   . Paroxysmal atrial fibrillation (HCC)   . Presence of permanent cardiac pacemaker   . Small cell lung cancer ([place]) 04/15/2019    Past Surgical History:  Procedure Laterality Date  . APPENDECTOMY    . BACK SURGERY     bone chip lasered  . CARDIAC CATHETERIZATION    . CARDIOVERSION N/A 10/24/2019   Procedure: CARDIOVERSION;  Surgeon: Minna Merritts, MD;  Location: [facility] ORS;  Service: Cardiovascular;  Laterality: N/A;  . CATARACT EXTRACTION W/ INTRAOCULAR LENS IMPLANT Right   . COLON SURGERY  1990's   12" removed of large intestine  . EYE SURGERY    . INSERT / REPLACE / [place]  . large intestine block    . PACEMAKER INSERTION     MDT dual chamber pacemaker  . PORTA CATH INSERTION N/A 04/17/2019  Procedure: PORTA CATH INSERTION;  Surgeon: Algernon Huxley, MD;  Location: [place] [facility];  Service: Cardiovascular;  Laterality: N/A;  . PPM GENERATOR CHANGEOUT N/A 12/02/2018   Procedure: PPM GENERATOR CHANGEOUT;  Surgeon: Deboraha Sprang, MD;  Location: [facility];  Service: Cardiovascular;  Laterality: N/A;  . TONSILLECTOMY AND ADENOIDECTOMY    . VIDEO BRONCHOSCOPY WITH ENDOBRONCHIAL ULTRASOUND N/A 04/07/2019   Procedure: VIDEO BRONCHOSCOPY WITH ENDOBRONCHIAL ULTRASOUND;  Surgeon: Tyler Pita, MD;  Location: [facility] ORS;  Service: Pulmonary;  Laterality: N/A;    Current Outpatient Medications  Medication Sig Dispense Refill  .  acetaminophen (ACETAMINOPHEN 8 HOUR) 650 MG CR tablet Take 650 mg by mouth every 8 (eight) hours as needed for pain.    Marland Kitchen albuterol (PROAIR HFA) 108 (90 Base) MCG/ACT inhaler Inhale 1-2 puffs into the lungs every 6 (six) hours as needed for wheezing or shortness of breath. 18 g 5  . brimonidine (ALPHAGAN) 0.15 % ophthalmic solution Place 1 drop into both eyes 2 (two) times daily.     . Budeson-Glycopyrrol-Formoterol (BREZTRI AEROSPHERE) 160-9-4.8 MCG/ACT AERO Inhale 2 puffs into the lungs 2 (two) times daily. (Patient taking differently: Inhale 2 puffs into the lungs 2 (two) times daily as needed (shortness of breath). ) 10.7 g 6  . Cholecalciferol (VITAMIN D3) 50 MCG (2000 UT) capsule Take 1 capsule (2,000 Units total) by mouth daily with lunch. 30 capsule 1  . diltiazem (CARDIZEM) 30 MG tablet TAKE 1 TABLET EVERY 4 HOURS AS NEEDED FOR HEART RATE GREATER THAN 100 AND TOP NUMBER BLOOD PRESSURE NEEDS TO BE GREATER THAN 100 90 tablet 1  . ELIQUIS 5 MG TABS tablet TAKE 1 TABLET BY MOUTH TWICE DAILY (Patient taking differently: Take 5 mg by mouth 2 (two) times daily. ) 60 tablet 5  . famotidine (PEPCID) 20 MG tablet Take 20 mg by mouth 2 (two) times daily.    . fenofibrate (TRICOR) 145 MG tablet TAKE 1 TABLET(145 MG) BY MOUTH DAILY (Patient taking differently: Take 145 mg by mouth daily. ) 90 tablet 1  . folic acid (FOLVITE) 1 MG tablet Take 1 mg by mouth daily.    Marland Kitchen gabapentin (NEURONTIN) 100 MG capsule Take 100 mg by mouth 3 (three) times daily.    Marland Kitchen ipratropium-albuterol (DUONEB) 0.5-2.5 (3) MG/3ML SOLN Take 3 mLs by nebulization every 6 (six) hours as needed. 360 mL 11  . lidocaine-prilocaine (EMLA) cream Apply to affected area once (Patient taking differently: Apply 1 application topically daily as needed (port flush). ) 30 g 3  . methotrexate 2.5 MG tablet Take 7.5 mg by mouth once a week.     . ondansetron (ZOFRAN) 8 MG tablet Take 1 tablet (8 mg total) by mouth 2 (two) times daily as needed for  refractory nausea / vomiting. Start on day 3 after carboplatin chemo. 30 tablet 1  . sodium chloride (OCEAN) 0.65 % SOLN nasal spray Place 1 spray into both nostrils at bedtime as needed for congestion.     . traMADol (ULTRAM) 50 MG tablet Take 25 mg by mouth 2 (two) times daily.      No current facility-administered medications for this visit.    Allergies  Allergen Reactions  . Sulfa Antibiotics Hives and Itching  . Penicillins Hives and Palpitations    Did it involve swelling of the face/tongue/throat, SOB, or low BP? No Did it involve sudden or severe rash/hives, skin peeling, or any reaction on the inside of your mouth or nose?  No Did you need to seek medical attention at a hospital or doctor's office? Unknown When did it last happen?childhood allergy If all above answers are "NO", may proceed with cephalosporin use.       Review of Systems negative except from HPI and PMH  Physical Exam BP 104/60 (BP Location: Right Arm, Patient Position: Sitting, Cuff Size: Normal)   Ht 5\' 6"  (1.676 m)   Wt 161 lb 6 oz (73.2 kg)   SpO2 95%   BMI 26.05 kg/m  Well developed and well nourished in no acute distress HENT normal Neck supple with JVP-flat Clear Device pocket well healed; without hematoma or erythema.  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  Priorities for the treatment of atrial fibrillation (#1  anticoagulation/stroke-prevention, #2 adequate rate control, and #3 considerations of rhythm control) were reviewed with questions answered. Risks, benefits, and alternatives to various diagnostic and treatment strategies were reviewed. Diagnostic considerations to exclude contributing factors such as thyroid disease and/ or obstructive sleep apnea, alcohol/hypertension/obesity were reviewed.  His QTC following cardioversion was less than 440.  Potentially a candidate for dofetilide.  His lung issues make amiodarone a poor choice.  However, given Covid and the inability to admit now for dofetilide, we will use flecainide.  Last assessment of coronary disease by Myoview scanning demonstrated no obstruction.  Ejection fraction 9/20 was normal.  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  You will need concomitant AV nodal blocking drugs.  We will have him take his diltiazem 30 mg every 8, blood pressure limiting further up titration.  Consider using digoxin; however, hopefully will get him cardioverted next week and would like to avoid making even more medications.  His cancer status is not clear.  He is supposed to have a CT scan next week to reassess staging.  In the event that there has been progression of his cancer, amiodarone becomes an option.  In the event that we cannot control his rhythm, AV junction ablation might be necessary.

## 2019-10-30 NOTE — Telephone Encounter (Signed)
Pt called in to report that is having trouble with Afib lately requiring cardioversion. Pt is scheduled for another cardioversion on 9/17 and per [name], pt is unable to tolerate CT contrast at this time. Pt requests to cancel his CT scan and follow up until after he completes cardioversion on 9/17. Pt stated that he will call back once cleared by cardiology. Appts have been cancelled at this time. I will follow up with patient the week of 9/20 to check on how he is feeling and to reschedule appts if pt feeling well enough.

## 2019-10-30 NOTE — Patient Instructions (Signed)
Medication Instructions:  - Your physician has recommended you make the following change in your medication:   1) Change diltiazem (cardizem) 30 mg- take 1 tablet (30 mg) every 8 hours until the day of your cardioversion  2) START flecainide 100 mg- take 1 tablet (100 mg) by mouth twice daily  3) START furosemide (lasix) 20 mg- take 2 tablets (40 mg) by mouth once daily   *If you need a refill on your cardiac medications before your next appointment, please call your pharmacy*   Lab Work: - Your physician recommends that you return for lab work Wednesday 11/05/19 (just prior to your COVID test)- [facility] entrance, 1st desk on the right to check in past the screening   - Pre-procedure COVID swab: Wednesday 11/05/19 (8:00 am- 1:00 pm) - [facility] [address] up test only  If you have labs (blood work) drawn today and your tests are completely normal, you will receive your results only by: Marland Kitchen MyChart Message (if you have MyChart) OR . A paper copy in the mail If you have any lab test that is abnormal or we need to change your treatment, we will call you to review the results.   Testing/Procedures: - Your physician has recommended that you have a Cardioversion (DCCV). Electrical Cardioversion uses a jolt of electricity to your heart either through paddles or wired patches attached to your chest. This is a controlled, usually prescheduled, procedure. Defibrillation is done under light anesthesia in the hospital, and you usually go home the day of the procedure. This is done to get your heart back into a normal rhythm. You are not awake for the procedure.   You are scheduled for a Cardioversion on Friday 11/07/19 with Dr. Rockey Situ.  Please arrive at the [facility] at 6:30 a.m. on the day of your procedure.  DIET INSTRUCTIONS:  Nothing to eat or drink after midnight except your medications with a              sip of water.         1) Labs: as above  2) Medications:  You  may take all of your regular medications the morning of your procedure with enough water to get them down safely unless listed below:  - HOLD lasix (furosemide) the morning of your procedure  3) Must have a responsible person to drive you home.  4) Bring a current list of your medications and current insurance cards.    If you have any questions after you get home, please call the office at [phone]    Follow-Up: At [facility], you and your health needs are our priority.  As part of our continuing mission to provide you with exceptional heart care, we have created designated Provider Care Teams.  These Care Teams include your primary Cardiologist (physician) and Advanced Practice Providers (APPs -  Physician Assistants and Nurse Practitioners) who all work together to provide you with the care you need, when you need it.  We recommend signing up for the patient portal called "MyChart".  Sign up information is provided on this After Visit Summary.  MyChart is used to connect with patients for Virtual Visits (Telemedicine).  Patients are able to view lab/test results, encounter notes, upcoming appointments, etc.  Non-urgent messages can be sent to your provider as well.   To learn more about what you can do with MyChart, go to [email or web address].    Your next appointment:   As scheduled 11/11/19  The format for your next appointment:   In Person  Provider:   Virl Axe, MD   Other Instructions n/a

## 2019-10-30 NOTE — Telephone Encounter (Signed)
Ok. Thanks for the update

## 2019-10-30 NOTE — H&P (View-Only) (Signed)
Patient Care Team: Pleas Koch, NP as PCP - General (Internal Medicine) Deboraha Sprang, MD as Consulting Physician (Cardiology) Marlowe Sax, MD as Referring Physician (Internal Medicine) Birder Robson, MD as Referring Physician (Ophthalmology) Oneta Rack, MD as Referring Physician (Dermatology) Salli Real, DDS as Referring Physician (Dentistry) Telford Nab, RN as Oncology Nurse Navigator Earlie Server, MD as Consulting Physician (Hematology and Oncology)   HPI  Darryl White is a 79 y.o. male Seen in followup for PM implanted [facility] at [facility] for syncope assoc with OI.   Generator replacement 2008 and  10/20  Healed without difficulty    AFib>> [facility] 8/16  . He has had no recurrent syncope, presumably neurally mediated by the family's description   On Anticoagulation w apixoban without bleeding  8/21-COPD exacerbation socially complicated by atrial fibrillation with rapid rates.  No clear impact on dyspnea, but underwent cardioversion  Unfortunately, he reverted within a couple of days.  Continued to struggle with heart failure with dyspnea, orthopnea as well as worsening peripheral edema.  Blood pressure has limited rate control  Date Cr K Hgb  6/18 1.1  14.9  1/19  1.14  15.2  4/20 1.37  13.7  1/21 1.18 4.6 14.7  9/21 0.85 3.8 76.5   Thromboembolic risk factors ( age  -2) for a CHADSVASc Score of 2    DATE TEST EF   8/16 Myoview  65 % No ischemia  9/20 Echo   55-60 %           Lung Ca s/p radiation Therapy now w another lesion noted >>  CT 1/21  "Marked progression of a precarinal lymph node showing central low attenuation suggesting necrosis. Progression highly suspicious for metastatic disease."  PET scan 1/21 demonstrated hypermetabolic adenopathy "significantly worsened compared to prior PET-CT, but roughly similar in size to chest CT from 3 weeks ago." Past Medical History:  Diagnosis Date  . Arthritis 2019   dx of  osteoarthritis and rhemautoid arthritis  . Cancer ([place])    lung  . Cataract 2019   left eye  . Chicken pox   . Collagen vascular disease ([place])   . 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 COPD (chronic obstructive pulmonary disease) ([place])   . Diverticulitis   . GERD (gastroesophageal reflux disease)   . 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 History of hiatal hernia   . History of stomach ulcers   . Hyperlipidemia   . Lymphocytic colitis   . Orthostatic hypotension   . Osteoarthritis   . Pancreatitis   . Paroxysmal atrial fibrillation (HCC)   . Presence of permanent cardiac pacemaker   . 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 APPENDECTOMY    . BACK SURGERY     bone chip lasered  . CARDIAC CATHETERIZATION    . CARDIOVERSION N/A 10/24/2019   Procedure: CARDIOVERSION;  Surgeon: Minna Merritts, MD;  Location: [facility] ORS;  Service: Cardiovascular;  Laterality: N/A;  . CATARACT EXTRACTION W/ INTRAOCULAR LENS IMPLANT Right   . COLON SURGERY  1990's   12" removed of large intestine  . EYE SURGERY    . INSERT / REPLACE / [place]  . large intestine block    . PACEMAKER INSERTION     MDT dual chamber pacemaker  . PORTA CATH INSERTION N/A 04/17/2019  Procedure: PORTA CATH INSERTION;  Surgeon: Algernon Huxley, MD;  Location: [facility];  Service: Cardiovascular;  Laterality: N/A;  . PPM GENERATOR CHANGEOUT N/A 12/02/2018   Procedure: PPM GENERATOR CHANGEOUT;  Surgeon: Deboraha Sprang, MD;  Location: [facility];  Service: Cardiovascular;  Laterality: N/A;  . TONSILLECTOMY AND ADENOIDECTOMY    . VIDEO BRONCHOSCOPY WITH ENDOBRONCHIAL ULTRASOUND N/A 04/07/2019   Procedure: VIDEO BRONCHOSCOPY WITH ENDOBRONCHIAL ULTRASOUND;  Surgeon: Tyler Pita, MD;  Location: [facility] ORS;  Service: Pulmonary;  Laterality: N/A;    Current Outpatient Medications  Medication Sig Dispense Refill  .  acetaminophen (ACETAMINOPHEN 8 HOUR) 650 MG CR tablet Take 650 mg by mouth every 8 (eight) hours as needed for pain.    Marland Kitchen albuterol (PROAIR HFA) 108 (90 Base) MCG/ACT inhaler Inhale 1-2 puffs into the lungs every 6 (six) hours as needed for wheezing or shortness of breath. 18 g 5  . brimonidine (ALPHAGAN) 0.15 % ophthalmic solution Place 1 drop into both eyes 2 (two) times daily.     . Budeson-Glycopyrrol-Formoterol (BREZTRI AEROSPHERE) 160-9-4.8 MCG/ACT AERO Inhale 2 puffs into the lungs 2 (two) times daily. (Patient taking differently: Inhale 2 puffs into the lungs 2 (two) times daily as needed (shortness of breath). ) 10.7 g 6  . Cholecalciferol (VITAMIN D3) 50 MCG (2000 UT) capsule Take 1 capsule (2,000 Units total) by mouth daily with lunch. 30 capsule 1  . diltiazem (CARDIZEM) 30 MG tablet TAKE 1 TABLET EVERY 4 HOURS AS NEEDED FOR HEART RATE GREATER THAN 100 AND TOP NUMBER BLOOD PRESSURE NEEDS TO BE GREATER THAN 100 90 tablet 1  . ELIQUIS 5 MG TABS tablet TAKE 1 TABLET BY MOUTH TWICE DAILY (Patient taking differently: Take 5 mg by mouth 2 (two) times daily. ) 60 tablet 5  . famotidine (PEPCID) 20 MG tablet Take 20 mg by mouth 2 (two) times daily.    . fenofibrate (TRICOR) 145 MG tablet TAKE 1 TABLET(145 MG) BY MOUTH DAILY (Patient taking differently: Take 145 mg by mouth daily. ) 90 tablet 1  . folic acid (FOLVITE) 1 MG tablet Take 1 mg by mouth daily.    Marland Kitchen gabapentin (NEURONTIN) 100 MG capsule Take 100 mg by mouth 3 (three) times daily.    Marland Kitchen ipratropium-albuterol (DUONEB) 0.5-2.5 (3) MG/3ML SOLN Take 3 mLs by nebulization every 6 (six) hours as needed. 360 mL 11  . lidocaine-prilocaine (EMLA) cream Apply to affected area once (Patient taking differently: Apply 1 application topically daily as needed (port flush). ) 30 g 3  . methotrexate 2.5 MG tablet Take 7.5 mg by mouth once a week.     . ondansetron (ZOFRAN) 8 MG tablet Take 1 tablet (8 mg total) by mouth 2 (two) times daily as needed for  refractory nausea / vomiting. Start on day 3 after carboplatin chemo. 30 tablet 1  . sodium chloride (OCEAN) 0.65 % SOLN nasal spray Place 1 spray into both nostrils at bedtime as needed for congestion.     . traMADol (ULTRAM) 50 MG tablet Take 25 mg by mouth 2 (two) times daily.      No current facility-administered medications for this visit.    Allergies  Allergen Reactions  . Sulfa Antibiotics Hives and Itching  . Penicillins Hives and Palpitations    Did it involve swelling of the face/tongue/throat, SOB, or low BP? No Did it involve sudden or severe rash/hives, skin peeling, or any reaction on the inside of your mouth or nose?  No Did you need to seek medical attention at a hospital or doctor's office? Unknown When did it last happen?childhood allergy If all above answers are "NO", may proceed with cephalosporin use.       Review of Systems negative except from HPI and PMH  Physical Exam BP 104/60 (BP Location: Right Arm, Patient Position: Sitting, Cuff Size: Normal)   Ht 5\' 6"  (1.676 m)   Wt 161 lb 6 oz (73.2 kg)   SpO2 95%   BMI 26.05 kg/m  Well developed and well nourished in no acute distress HENT normal Neck supple with JVP-flat Clear Device pocket well healed; without hematoma or erythema.  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  Priorities for the treatment of atrial fibrillation (#1  anticoagulation/stroke-prevention, #2 adequate rate control, and #3 considerations of rhythm control) were reviewed with questions answered. Risks, benefits, and alternatives to various diagnostic and treatment strategies were reviewed. Diagnostic considerations to exclude contributing factors such as thyroid disease and/ or obstructive sleep apnea, alcohol/hypertension/obesity were reviewed.  His QTC following cardioversion was less than 440.  Potentially a candidate for dofetilide.  His lung issues make amiodarone a poor choice.  However, given Covid and the inability to admit now for dofetilide, we will use flecainide.  Last assessment of coronary disease by Myoview scanning demonstrated no obstruction.  Ejection fraction 9/20 was normal.  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  You will need concomitant AV nodal blocking drugs.  We will have him take his diltiazem 30 mg every 8, blood pressure limiting further up titration.  Consider using digoxin; however, hopefully will get him cardioverted next week and would like to avoid making even more medications.  His cancer status is not clear.  He is supposed to have a CT scan next week to reassess staging.  In the event that there has been progression of his cancer, amiodarone becomes an option.  In the event that we cannot control his rhythm, AV junction ablation might be necessary.

## 2019-10-31 MED ORDER — DOXYCYCLINE HYCLATE 100 MG PO TABS: 100.0000 mg | ORAL_TABLET | Freq: Two times a day (BID) | ORAL | 0 refills | Status: DC

## 2019-10-31 NOTE — Telephone Encounter (Signed)
He can resume Breztri 2 puffs twice a day.  Use nebulizer only as needed during the day.  The prescription for the nebulizer has to be written "as needed" because that is the only way Medicare will pay for it.  He was instructed that he could use it 4 times a day.  However if he starts of Breztri again he can use and an additional 2 times a day if needed.  He also was supposed to get Pulmicort and I am not sure he got that.  In any event he can resume Breztri.  Call in some doxycycline in case he has a exacerbation of bronchitis.  Doxy 100 mg twice a day for 7 days.  He may use Mucinex DM over-the-counter 1200 mg (extra strength) twice a day for cough.

## 2019-10-31 NOTE — Telephone Encounter (Signed)
Called and went over recs with patient from Dr. Patsey Berthold. Patient expressed understanding. RX sent to pharmacy. Nothing further needed at this time.

## 2019-11-04 ENCOUNTER — Ambulatory Visit: Admission: RE | Admit: 2019-11-04 | Payer: Medicare PPO | Source: Ambulatory Visit

## 2019-11-05 ENCOUNTER — Other Ambulatory Visit: Payer: Self-pay

## 2019-11-05 ENCOUNTER — Other Ambulatory Visit
Admission: RE | Admit: 2019-11-05 | Discharge: 2019-11-05 | Disposition: A | Discharge status: Home / Self Care | Payer: Medicare PPO | Source: Home / Self Care | Attending: Internal Medicine | Admitting: Internal Medicine

## 2019-11-05 ENCOUNTER — Other Ambulatory Visit
Admission: RE | Admit: 2019-11-05 | Discharge: 2019-11-05 | Disposition: A | Discharge status: Home / Self Care | Payer: Medicare PPO | Source: Ambulatory Visit | Attending: Internal Medicine | Admitting: Internal Medicine

## 2019-11-05 DIAGNOSIS — Z01812 Encounter for preprocedural laboratory examination: Secondary | ICD-10-CM | POA: Diagnosis present

## 2019-11-05 DIAGNOSIS — R600 Localized edema: Secondary | ICD-10-CM

## 2019-11-05 DIAGNOSIS — Z79899 Other long term (current) drug therapy: Secondary | ICD-10-CM

## 2019-11-05 DIAGNOSIS — Z20822 Contact with and (suspected) exposure to covid-19: Secondary | ICD-10-CM | POA: Diagnosis not present

## 2019-11-05 DIAGNOSIS — I480 Paroxysmal atrial fibrillation: Secondary | ICD-10-CM

## 2019-11-05 LAB — BASIC METABOLIC PANEL
Anion gap: 10 (ref 5–15)
BUN: 22 mg/dL (ref 8–23)
CO2: 30 mmol/L (ref 22–32)
Calcium: 9.1 mg/dL (ref 8.9–10.3)
Chloride: 92 mmol/L — ABNORMAL LOW (ref 98–111)
Creatinine, Ser: 1.1 mg/dL (ref 0.61–1.24)
GFR calc Af Amer: >60 mL/min (ref >60)
GFR calc non Af Amer: >60 mL/min (ref >60)
Glucose, Bld: 112 mg/dL — ABNORMAL HIGH (ref 70–99)
Potassium: 3.6 mmol/L (ref 3.5–5.1)
Sodium: 132 mmol/L — ABNORMAL LOW (ref 135–145)

## 2019-11-06 ENCOUNTER — Ambulatory Visit: Payer: Medicare PPO | Admitting: Oncology

## 2019-11-06 ENCOUNTER — Other Ambulatory Visit: Payer: Self-pay | Admitting: Internal Medicine

## 2019-11-06 ENCOUNTER — Ambulatory Visit: Payer: Medicare PPO | Admitting: Radiation Oncology

## 2019-11-06 ENCOUNTER — Encounter: Payer: Self-pay | Admitting: Anesthesiology

## 2019-11-06 LAB — SARS CORONAVIRUS 2 (TAT 6-24 HRS): SARS Coronavirus 2: NEGATIVE

## 2019-11-07 ENCOUNTER — Encounter
Admission: RE | Disposition: A | Discharge status: Home / Self Care | Payer: Self-pay | Source: Home / Self Care | Attending: Cardiovascular Disease

## 2019-11-07 ENCOUNTER — Ambulatory Visit
Admission: RE | Admit: 2019-11-07 | Discharge: 2019-11-07 | Disposition: A | Discharge status: Home / Self Care | Payer: Medicare PPO | Attending: Cardiovascular Disease | Admitting: Cardiovascular Disease

## 2019-11-07 ENCOUNTER — Telehealth: Payer: Self-pay | Admitting: Internal Medicine

## 2019-11-07 ENCOUNTER — Other Ambulatory Visit: Payer: Self-pay

## 2019-11-07 ENCOUNTER — Encounter: Payer: Self-pay | Admitting: Cardiovascular Disease

## 2019-11-07 DIAGNOSIS — I951 Orthostatic hypotension: Secondary | ICD-10-CM | POA: Diagnosis not present

## 2019-11-07 DIAGNOSIS — K219 Gastro-esophageal reflux disease without esophagitis: Secondary | ICD-10-CM | POA: Insufficient documentation

## 2019-11-07 DIAGNOSIS — E785 Hyperlipidemia, unspecified: Secondary | ICD-10-CM | POA: Insufficient documentation

## 2019-11-07 DIAGNOSIS — I495 Sick sinus syndrome: Secondary | ICD-10-CM | POA: Diagnosis not present

## 2019-11-07 DIAGNOSIS — I442 Atrioventricular block, complete: Secondary | ICD-10-CM | POA: Insufficient documentation

## 2019-11-07 DIAGNOSIS — I509 Heart failure, unspecified: Secondary | ICD-10-CM | POA: Diagnosis not present

## 2019-11-07 DIAGNOSIS — Z79899 Other long term (current) drug therapy: Secondary | ICD-10-CM | POA: Diagnosis not present

## 2019-11-07 DIAGNOSIS — I4819 Other persistent atrial fibrillation: Secondary | ICD-10-CM | POA: Diagnosis not present

## 2019-11-07 DIAGNOSIS — M069 Rheumatoid arthritis, unspecified: Secondary | ICD-10-CM | POA: Insufficient documentation

## 2019-11-07 DIAGNOSIS — Z950 Presence of cardiac pacemaker: Secondary | ICD-10-CM | POA: Insufficient documentation

## 2019-11-07 DIAGNOSIS — Z7901 Long term (current) use of anticoagulants: Secondary | ICD-10-CM | POA: Insufficient documentation

## 2019-11-07 DIAGNOSIS — I110 Hypertensive heart disease with heart failure: Secondary | ICD-10-CM | POA: Diagnosis not present

## 2019-11-07 DIAGNOSIS — Z882 Allergy status to sulfonamides status: Secondary | ICD-10-CM | POA: Insufficient documentation

## 2019-11-07 DIAGNOSIS — Z880 Allergy status to penicillin: Secondary | ICD-10-CM | POA: Insufficient documentation

## 2019-11-07 DIAGNOSIS — J449 Chronic obstructive pulmonary disease, unspecified: Secondary | ICD-10-CM | POA: Insufficient documentation

## 2019-11-07 HISTORY — PX: CARDIOVERSION: SHX1299

## 2019-11-07 SURGERY — CARDIOVERSION
Anesthesia: General

## 2019-11-07 NOTE — Telephone Encounter (Signed)
Patient was in [place] and cardioversion was cancelled .   Wife would like advise on medications if there is any change now that there is no procedure.    Please call

## 2019-11-07 NOTE — Telephone Encounter (Signed)
I spoke with [name] prior to calling the patient and his wife back. Per Dr Caryl Comes, he was notified this morning that Darryl White was in [facility] when he arrived for his DCCV this morning. [name] advised that the patient continue his flecainide & diltiazem as he has been taking until he is seen back in the office next week. We will re-evaluate his medications at that time.  I have called and notifed the patient and his wife. Darryl White inquired if he should continue lasix 20 mg -2 tablets (40 mg) once daily or should he cut back on this. I inquired what the status of his swelling was like. Per Darryl White, the patient's legs are less swollen at this time. I advised unless his urine output drops and his ankles are skinny, to continue his lasix at 40 mg once daily until his office visit. If his ankles are skinny and his urine output drops off, he may decrease to lasix 20 mg once daily.  The patient's wife voices understanding and is agreeable.

## 2019-11-09 NOTE — Interval H&P Note (Signed)
History and Physical Interval Note:  11/09/2019 10:26 AM  Darryl White  has presented today for surgery, with the diagnosis of Cardioversion  Afib.  The various methods of treatment have been discussed with the patient and family. After consideration of risks, benefits and other options for treatment, the patient has consented to  Procedure(s): CARDIOVERSION (N/A) as a surgical intervention.  The patient's history has been reviewed, patient examined, no change in status, stable for surgery.  I have reviewed the patient's chart and labs.  Questions were answered to the patient's satisfaction.     Ida Rogue

## 2019-11-09 NOTE — H&P (Signed)
H&P Addendum, pre-cardioversion  Patient was seen and evaluated prior to -cardioversion procedure  EKG showing NSR Procedure/cardioversion was cancelled    Signed, Esmond Plants, MD, Ph.D [facility]

## 2019-11-11 ENCOUNTER — Other Ambulatory Visit: Payer: Self-pay

## 2019-11-11 ENCOUNTER — Ambulatory Visit (INDEPENDENT_AMBULATORY_CARE_PROVIDER_SITE_OTHER): Payer: Medicare PPO | Admitting: Internal Medicine

## 2019-11-11 ENCOUNTER — Encounter: Payer: Self-pay | Admitting: Internal Medicine

## 2019-11-11 VITALS — BP 100/70 | HR 69 | Ht 66.0 in | Wt 158.0 lb

## 2019-11-11 DIAGNOSIS — Z950 Presence of cardiac pacemaker: Secondary | ICD-10-CM

## 2019-11-11 DIAGNOSIS — I480 Paroxysmal atrial fibrillation: Secondary | ICD-10-CM

## 2019-11-11 DIAGNOSIS — I495 Sick sinus syndrome: Secondary | ICD-10-CM | POA: Diagnosis not present

## 2019-11-11 LAB — CUP PACEART INCLINIC DEVICE CHECK
Battery Remaining Longevity: 167 mo
Battery Voltage: 3.16 V
Brady Statistic AP VP Percent: 0.03 %
Brady Statistic AP VS Percent: 40.29 %
Brady Statistic AS VP Percent: 0.03 %
Brady Statistic AS VS Percent: 59.68 %
Brady Statistic RA Percent Paced: 35.18 %
Brady Statistic RV Percent Paced: 0.25 %
Date Time Interrogation Session: 20210921114246
Implantable Lead Implant Date: 19941110
Implantable Lead Implant Date: 19941110
Implantable Lead Location: 753859
Implantable Lead Location: 753860
Implantable Lead Model: 5034
Implantable Lead Model: 5534
Implantable Pulse Generator Implant Date: 20201012
Lead Channel Impedance Value: 703 Ohm
Lead Channel Impedance Value: 703 Ohm
Lead Channel Impedance Value: 798 Ohm
Lead Channel Impedance Value: 798 Ohm
Lead Channel Pacing Threshold Amplitude: 0.625 V
Lead Channel Pacing Threshold Amplitude: 1.5 V
Lead Channel Pacing Threshold Pulse Width: 0.4 ms
Lead Channel Pacing Threshold Pulse Width: 0.4 ms
Lead Channel Sensing Intrinsic Amplitude: 11.75 mV
Lead Channel Sensing Intrinsic Amplitude: 3 mV
Lead Channel Sensing Intrinsic Amplitude: 9.875 mV
Lead Channel Setting Pacing Amplitude: 1.5 V
Lead Channel Setting Pacing Amplitude: 2.5 V
Lead Channel Setting Pacing Pulse Width: 0.6 ms
Lead Channel Setting Sensing Sensitivity: 4 mV

## 2019-11-11 LAB — PACEMAKER DEVICE OBSERVATION

## 2019-11-11 MED ORDER — DILTIAZEM HCL ER COATED BEADS 120 MG PO CP24: 120.0000 mg | ORAL_CAPSULE | Freq: Every day | ORAL | 3 refills | Status: AC

## 2019-11-11 MED ORDER — FUROSEMIDE 20 MG PO TABS: ORAL_TABLET | ORAL | Status: AC

## 2019-11-11 NOTE — Progress Notes (Signed)
Patient Care Team: Pleas Koch, NP as PCP - General (Internal Medicine) Deboraha Sprang, MD as Consulting Physician (Cardiology) Marlowe Sax, MD as Referring Physician (Internal Medicine) Birder Robson, MD as Referring Physician (Ophthalmology) Oneta Rack, MD as Referring Physician (Dermatology) Salli Real, DDS as Referring Physician (Dentistry) Telford Nab, RN as Oncology Nurse Navigator Earlie Server, MD as Consulting Physician (Hematology and Oncology)   HPI  Darryl White is a 79 y.o. male Seen in followup for PM implanted 1994 at [facility] for syncope assoc with OI.   Generator replacement 2008 and  10/20  Healed without difficulty  AFib>> [facility] 8/16  . He has had no recurrent syncope, presumably neurally mediated by the family's description   On Anticoagulation w apixoban without bleeding  8/21-COPD exacerbation socially complicated by atrial fibrillation with rapid rates.  No clear impact on dyspnea, but underwent cardioversion.  Also hx of R lung cancer s/p chemoradiation.    Unfortunately, he reverted within a couple of days.  Continued to struggle with heart failure with dyspnea, orthopnea as well as worsening peripheral edema. Started on flecainide as outpt and converted spontaneously   BP has limited rate control meds   But BP at home 120s Holding sinus but no interval improvement  Dyspnea and fatigue remain problem, shortness of breath not improving   Mostly sitting around with somnolence     Date Cr K Hgb  6/18 1.1  14.9  1/19  1.14  15.2  4/20 1.37  13.7  1/21 1.18 4.6 14.7  9/21 0.85 3.8 70.2   Thromboembolic risk factors ( age  -2) for a CHADSVASc Score of 2    DATE TEST EF   8/16 Myoview  65 % No ischemia  9/20 Echo   55-60 %           Lung Ca s/p radiation Therapy now w another lesion noted >>  CT 1/21  "Marked progression of a precarinal lymph node showing central low attenuation suggesting necrosis.  Progression highly suspicious for metastatic disease."  PET scan 1/21 demonstrated hypermetabolic adenopathy "significantly worsened compared to prior PET-CT, but roughly similar in size to chest CT from 3 weeks ago." Rx w Chemo/XRT and >>Significant reduction in size of the previously hypermetabolic right lower paratracheal lymph node compared to prior PET-CT, currently measuring 1.0 cm in short axis, previously 2.4 cm in short axis.   He describes himself as "cured" Past Medical History:  Diagnosis Date  . Arthritis 2019   dx of osteoarthritis and rhemautoid arthritis  . Cancer ([place])    lung  . Cataract 2019   left eye  . Chicken pox   . Collagen vascular disease ([place])   . 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 COPD (chronic obstructive pulmonary disease) ([place])   . Diverticulitis   . GERD (gastroesophageal reflux disease)   . 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 History of hiatal hernia   . History of stomach ulcers   . Hyperlipidemia   . Lymphocytic colitis   . Orthostatic hypotension   . Osteoarthritis   . Pancreatitis   . Paroxysmal atrial fibrillation (HCC)   . Presence of permanent cardiac pacemaker   . 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 APPENDECTOMY    . BACK SURGERY     bone chip lasered  . CARDIAC CATHETERIZATION    .  CARDIOVERSION N/A 10/24/2019   Procedure: CARDIOVERSION;  Surgeon: Minna Merritts, MD;  Location: [facility] ORS;  Service: Cardiovascular;  Laterality: N/A;  . CARDIOVERSION N/A 11/07/2019   Procedure: CARDIOVERSION;  Surgeon: Minna Merritts, MD;  Location: [facility] ORS;  Service: Cardiovascular;  Laterality: N/A;  . CATARACT EXTRACTION W/ INTRAOCULAR LENS IMPLANT Right   . COLON SURGERY  1990's   12" removed of large intestine  . EYE SURGERY    . INSERT / REPLACE / [place]  . large intestine block    . PACEMAKER INSERTION     MDT dual chamber pacemaker    . PORTA CATH INSERTION N/A 04/17/2019   Procedure: PORTA CATH INSERTION;  Surgeon: Algernon Huxley, MD;  Location: [facility];  Service: Cardiovascular;  Laterality: N/A;  . PPM GENERATOR CHANGEOUT N/A 12/02/2018   Procedure: PPM GENERATOR CHANGEOUT;  Surgeon: Deboraha Sprang, MD;  Location: [facility];  Service: Cardiovascular;  Laterality: N/A;  . TONSILLECTOMY AND ADENOIDECTOMY    . VIDEO BRONCHOSCOPY WITH ENDOBRONCHIAL ULTRASOUND N/A 04/07/2019   Procedure: VIDEO BRONCHOSCOPY WITH ENDOBRONCHIAL ULTRASOUND;  Surgeon: Tyler Pita, MD;  Location: [facility];  Service: Pulmonary;  Laterality: N/A;    Current Outpatient Medications  Medication Sig Dispense Refill  . acetaminophen (ACETAMINOPHEN 8 HOUR) 650 MG CR tablet Take 650 mg by mouth every 8 (eight) hours as needed for pain.    Marland Kitchen albuterol (PROAIR HFA) 108 (90 Base) MCG/ACT inhaler Inhale 1-2 puffs into the lungs every 6 (six) hours as needed for wheezing or shortness of breath. 18 g 5  . apixaban (ELIQUIS) 5 MG TABS tablet Take 5 mg by mouth 2 (two) times daily.    . brimonidine (ALPHAGAN) 0.15 % ophthalmic solution Place 1 drop into both eyes 2 (two) times daily.     . Budeson-Glycopyrrol-Formoterol (BREZTRI AEROSPHERE) 160-9-4.8 MCG/ACT AERO Inhale 2 puffs into the lungs in the morning and at bedtime.    . Cholecalciferol (VITAMIN D3) 50 MCG (2000 UT) capsule Take 1 capsule (2,000 Units total) by mouth daily with lunch. 30 capsule 1  . diltiazem (CARDIZEM) 30 MG tablet Take 1 tablet (30 mg) by mouth every 8 hours as directed    . famotidine (PEPCID) 20 MG tablet Take 20 mg by mouth 2 (two) times daily as needed for heartburn or indigestion.    . fenofibrate (TRICOR) 145 MG tablet Take 145 mg by mouth daily.    . flecainide (TAMBOCOR) 100 MG tablet Take 1 tablet (100 mg total) by mouth 2 (two) times daily. 60 tablet 3  . folic acid (FOLVITE) 1 MG tablet Take 1 mg by mouth daily.    . furosemide (LASIX) 20 MG tablet  Take 2 tablets (40 mg) by mouth once daily as directed 180 tablet 1  . gabapentin (NEURONTIN) 100 MG capsule Take 100 mg by mouth 3 (three) times daily.    . methotrexate 2.5 MG tablet Take 7.5 mg by mouth once a week.     . sodium chloride (OCEAN) 0.65 % SOLN nasal spray Place 1 spray into both nostrils at bedtime as needed for congestion.     . traMADol (ULTRAM) 50 MG tablet Take 25 mg by mouth 2 (two) times daily.     . Budeson-Glycopyrrol-Formoterol (BREZTRI AEROSPHERE) 160-9-4.8 MCG/ACT AERO Inhale 2 puffs into the lungs 2 (two) times daily. (Patient taking differently: Inhale 2 puffs into the lungs 2 (two) times daily as needed (shortness of breath). ) 10.7  g 6  . doxycycline (VIBRA-TABS) 100 MG tablet Take 1 tablet (100 mg total) by mouth 2 (two) times daily. 14 tablet 0  . ELIQUIS 5 MG TABS tablet TAKE 1 TABLET BY MOUTH TWICE DAILY (Patient taking differently: Take 5 mg by mouth 2 (two) times daily. ) 60 tablet 5  . famotidine (PEPCID) 20 MG tablet Take 20 mg by mouth 2 (two) times daily.    . fenofibrate (TRICOR) 145 MG tablet TAKE 1 TABLET(145 MG) BY MOUTH DAILY (Patient taking differently: Take 145 mg by mouth daily. ) 90 tablet 1  . ipratropium-albuterol (DUONEB) 0.5-2.5 (3) MG/3ML SOLN Take 3 mLs by nebulization every 6 (six) hours as needed. 360 mL 11  . lidocaine-prilocaine (EMLA) cream Apply to affected area once (Patient taking differently: Apply 1 application topically daily as needed (port flush). ) 30 g 3  . ondansetron (ZOFRAN) 8 MG tablet Take 1 tablet (8 mg total) by mouth 2 (two) times daily as needed for refractory nausea / vomiting. Start on day 3 after carboplatin chemo. 30 tablet 1   No current facility-administered medications for this visit.    Allergies  Allergen Reactions  . Sulfa Antibiotics Hives and Itching  . Penicillins Hives and Palpitations    Did it involve swelling of the face/tongue/throat, SOB, or low BP? No Did it involve sudden or severe rash/hives,  skin peeling, or any reaction on the inside of your mouth or nose? No Did you need to seek medical attention at a hospital or doctor's office? Unknown When did it last happen?childhood allergy If all above answers are "NO", may proceed with cephalosporin use.       Review of Systems negative except from HPI and PMH  Physical Exam BP 100/70 (BP Location: Left Arm, Patient Position: Sitting, Cuff Size: Normal)   Pulse 69   Ht 5\' 6"  (1.676 m)   Wt 158 lb (71.7 kg)   SpO2 93% Comment: on 4L O2  BMI 25.50 kg/m  Well developed and well nourished in no acute distress HENT normal Neck supple with JVP-flat Clear Device pocket well healed; without hematoma or erythema.  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

## 2019-11-11 NOTE — Patient Instructions (Addendum)
Medication Instructions:  - Your physician has recommended you make the following change in your medication:   1) START diltiazem 120 mg- take 1 capsule by mouth once daily   2) Change diltiazem 30 mg- take 1 tablet by mouth every 8 hours as needed for breakthrough a-fib  3) Change lasix 20 mg- take 2 tablet (40 mg) by mouth once every other day   *If you need a refill on your cardiac medications before your next appointment, please call your pharmacy*   Lab Work: - none ordered  If you have labs (blood work) drawn today and your tests are completely normal, you will receive your results only by: Marland Kitchen MyChart Message (if you have MyChart) OR . A paper copy in the mail If you have any lab test that is abnormal or we need to change your treatment, we will call you to review the results.   Testing/Procedures: - none ordered   Follow-Up: At [facility], you and your health needs are our priority.  As part of our continuing mission to provide you with exceptional heart care, we have created designated Provider Care Teams.  These Care Teams include your primary Cardiologist (physician) and Advanced Practice Providers (APPs -  Physician Assistants and Nurse Practitioners) who all work together to provide you with the care you need, when you need it.  We recommend signing up for the patient portal called "MyChart".  Sign up information is provided on this After Visit Summary.  MyChart is used to connect with patients for Virtual Visits (Telemedicine).  Patients are able to view lab/test results, encounter notes, upcoming appointments, etc.  Non-urgent messages can be sent to your provider as well.   To learn more about what you can do with MyChart, go to [email or web address].    Your next appointment:   3 month(s)  The format for your next appointment:   In Person  Provider:   Virl Axe, MD   Other Instructions  - we will touch base with Dr. Patsey Berthold about a referral to  pulmonary rehab

## 2019-11-17 ENCOUNTER — Telehealth: Payer: Self-pay | Admitting: Pulmonary Disease

## 2019-11-17 MED ORDER — AZITHROMYCIN 250 MG PO TABS: ORAL_TABLET | ORAL | 0 refills | Status: AC

## 2019-11-17 MED ORDER — PREDNISONE 10 MG PO TABS: ORAL_TABLET | ORAL | 0 refills | Status: DC

## 2019-11-17 NOTE — Telephone Encounter (Signed)
Called and spoke to patient's spouse, [name]. Darryl White stated that patient's oxygen level is dropping to 82% on 4L with exertion. spo2 recovers to 93% on 4L after 58min of rest.  Patient c/o increased sob and prod cough with yellow mucus. Denied fever, chills, sweats or sick contacts.  Patient had cardioversion on 11/07/2019.  I recommended ED due current sx and low oxygen levels. Patient refused ED and would like an appt with our office. Patient aware that we do not currently have availability.  Beth, please advise as Dr. Patsey Berthold is unavailable.

## 2019-11-17 NOTE — Telephone Encounter (Signed)
Patient's spouse, Opal Sidles [facility]) is aware of recommendations and voiced her understanding.  Patient refused ED.  Rx for prednisone and zpak has been sent to preferred pharmacy. appt scheduled for 12/23/2019 with Dr. Patsey Berthold.  Nothing further needed.

## 2019-11-17 NOTE — Telephone Encounter (Signed)
Agree with ED evaluation, if he refuses we can send in prednisone taper (40mg  x 2days; 30mg  x 2 days; 20mg  x 2 days; 10mg  x 2 days) and Zpack for suspected COPD exacerbation. Make sure he is back to using Breztri twice a day. He can go up to 5L on exertion if concentrator is able to.

## 2019-11-25 ENCOUNTER — Other Ambulatory Visit: Payer: Self-pay | Admitting: Pulmonary Disease

## 2019-11-26 ENCOUNTER — Telehealth: Payer: Self-pay | Admitting: Internal Medicine

## 2019-11-26 NOTE — Telephone Encounter (Signed)
Patients wife calling in to clarify the lasix prescription

## 2019-11-26 NOTE — Telephone Encounter (Signed)
Noted Thanks SK   

## 2019-11-26 NOTE — Telephone Encounter (Signed)
I spoke with the patient's wife. She states the patient is still currently on lasix 20 mg- taking 2 tablets (40 mg) once every other day.  He is always going to the bathroom, but his swelling has improved. His breathing is currently stable as well as his weight.  They are wanting to know if he can cut back on his lasix and see how he does.  I have advised the patient's wife, they can try: 1) cutting lasix in 1/2- take 1 tablet (20 mg) once every other day and continue to monitor swelling & daily weights.  2) if swelling increases/ weight goes up, they may try lasix 20 mg once daily  3) if that is not working, they will call back.  The patient's wife voices understanding and is agreeable.  She is aware I will send to [name] to review.

## 2019-12-05 ENCOUNTER — Other Ambulatory Visit: Payer: Self-pay

## 2019-12-05 ENCOUNTER — Ambulatory Visit
Admission: RE | Admit: 2019-12-05 | Discharge: 2019-12-05 | Disposition: A | Discharge status: Home / Self Care | Payer: Medicare PPO | Source: Ambulatory Visit | Attending: Oncology | Admitting: Oncology

## 2019-12-05 DIAGNOSIS — C3490 Malignant neoplasm of unspecified part of unspecified bronchus or lung: Secondary | ICD-10-CM | POA: Diagnosis not present

## 2019-12-05 MED ORDER — IOHEXOL 300 MG/ML  SOLN
100.0000 mL | Freq: Once | INTRAMUSCULAR | Status: AC | PRN
  Administered 2019-12-05: 100 mL via INTRAVENOUS

## 2019-12-08 ENCOUNTER — Ambulatory Visit: Payer: Medicare PPO | Admitting: Pulmonary Disease

## 2019-12-08 ENCOUNTER — Other Ambulatory Visit: Payer: Self-pay

## 2019-12-08 ENCOUNTER — Encounter: Payer: Self-pay | Admitting: Pulmonary Disease

## 2019-12-08 VITALS — BP 102/60 | HR 75 | Temp 97.7°F | Ht 66.0 in | Wt 152.8 lb

## 2019-12-08 DIAGNOSIS — J84112 Idiopathic pulmonary fibrosis: Secondary | ICD-10-CM

## 2019-12-08 DIAGNOSIS — J181 Lobar pneumonia, unspecified organism: Secondary | ICD-10-CM

## 2019-12-08 DIAGNOSIS — I4819 Other persistent atrial fibrillation: Secondary | ICD-10-CM

## 2019-12-08 DIAGNOSIS — C3490 Malignant neoplasm of unspecified part of unspecified bronchus or lung: Secondary | ICD-10-CM | POA: Diagnosis not present

## 2019-12-08 DIAGNOSIS — I5189 Other ill-defined heart diseases: Secondary | ICD-10-CM

## 2019-12-08 DIAGNOSIS — R0602 Shortness of breath: Secondary | ICD-10-CM

## 2019-12-08 DIAGNOSIS — J441 Chronic obstructive pulmonary disease with (acute) exacerbation: Secondary | ICD-10-CM

## 2019-12-08 MED ORDER — PREDNISONE 20 MG PO TABS
ORAL_TABLET | ORAL | 1 refills | Status: AC
Start: 2019-12-08 — End: ?

## 2019-12-08 NOTE — Patient Instructions (Signed)
We are scheduling a PET/CT and a 2D echo  Take your doxycycline as previously prescribed  Continue Breztri and nebulizer as you are doing  Continue oxygen  Have been prescribed prednisone 20 mg daily until you see me again  Keep your 2 November appointment as scheduled

## 2019-12-08 NOTE — Progress Notes (Signed)
Subjective:    Patient ID: Darryl White, male    DOB: 07-Apr-1940, 79 y.o.   MRN: [id number]  HPI Darryl White is a 79 year old former smoker with a history of COPD and chronic hypoxic respiratory failure who presents for increased dyspnea over baseline.  He was started on oxygen on 6 August after an admission for COPD exacerbation.  He was placed initially on 3 L/min via nasal cannula.  He was discharged after 2 days in the hospital.  Here lately he has had to increase this to 4 L/min.  He was last seen here in March 2021 advised to follow-up in 3 months.  There had been a scheduling glitch and the patient had not been able to get back in.  He was doing well until his exacerbation on 6 August.  On 16 August he called and was triaged by our nurse practitioner and a chest x-ray was ordered.  There was evidence of pneumonia.  He was ordered antibiotics however though he filled the prescription, he has not taken them.  It was recommended that he return for evaluation at the ED however he refused.  He was given prednisone taper along with the antibiotics.  He took the prednisone taper noting some minimal relief.  He also the interim has had issues with atrial fibrillation, had to be cardioverted on 3 September.  Cardioversion lasted a few days and then recurred.  The cardioversion on 17 September but by the time he presented the rhythm was back to sinus.  He has not had any fevers, chills or sweats.  No chest pain.  No cough over baseline.  He has a history of small cell lung cancer and has undergone concurrent chemoradiation.He had a CT scan of the chest abdomen and pelvis performed on 15 October is persistent airspace disease/consolidation likely resolving pneumonia.  Small (too small to tap) right pleural effusion development of pulmonary nodules including the dominant lingular nodule suspicious for metastatic disease.  Mediastinal adenopathy is decreased.  Severe emphysema and pulmonary fibrosis.  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    Review of Systems A 10 point review of systems was performed and it is as noted above otherwise negative.   Allergies  Allergen Reactions  . Sulfa Antibiotics Hives and Itching  . Penicillins Hives and Palpitations    Did it involve swelling of the face/tongue/throat, SOB, or low BP? No Did it involve sudden or severe rash/hives, skin peeling, or any reaction on the inside of your mouth or nose? No Did you need to seek medical attention at a hospital or doctor's office? Unknown When did it last happen?childhood allergy If all above answers are "NO", may proceed with cephalosporin use.    Current Meds  Medication Sig  . acetaminophen (ACETAMINOPHEN 8 HOUR) 650 MG CR tablet Take 650 mg by mouth every 8 (eight) hours as needed for pain.  Marland Kitchen albuterol (PROAIR HFA) 108 (90 Base) MCG/ACT inhaler Inhale 1-2 puffs into the lungs every 6 (six) hours as needed for wheezing or shortness of breath.  Marland Kitchen BREZTRI AEROSPHERE 160-9-4.8 MCG/ACT AERO INHALE 2 PUFFS INTO THE LUNGS TWICE DAILY  . brimonidine (ALPHAGAN) 0.15 % ophthalmic solution Place 1 drop into both eyes 2 (two) times daily.   . Cholecalciferol (VITAMIN D3) 50 MCG (2000 UT) capsule Take 1 capsule (2,000 Units total) by mouth daily with lunch.  . diltiazem (CARDIZEM CD) 120 MG 24 hr capsule Take 1 capsule (120  mg total) by mouth daily.  Marland Kitchen ELIQUIS 5 MG TABS tablet TAKE 1 TABLET BY MOUTH TWICE DAILY (Patient taking differently: Take 5 mg by mouth 2 (two) times daily. )  . famotidine (PEPCID) 20 MG tablet Take 20 mg by mouth 2 (two) times daily.  . famotidine (PEPCID) 20 MG tablet Take 20 mg by mouth 2 (two) times daily as needed for heartburn or indigestion.  . fenofibrate (TRICOR) 145 MG tablet TAKE 1 TABLET(145 MG) BY MOUTH DAILY (Patient taking differently: Take 145 mg by mouth daily. )  . flecainide (TAMBOCOR) 100 MG tablet Take 1 tablet (100 mg total) by  mouth 2 (two) times daily.  . folic acid (FOLVITE) 1 MG tablet Take 1 mg by mouth daily.  . furosemide (LASIX) 20 MG tablet Take 2 tablets (40 mg) by mouth once every other day  . gabapentin (NEURONTIN) 100 MG capsule Take 100 mg by mouth 3 (three) times daily.  Marland Kitchen ipratropium-albuterol (DUONEB) 0.5-2.5 (3) MG/3ML SOLN Take 3 mLs by nebulization every 6 (six) hours as needed.  . lidocaine-prilocaine (EMLA) cream Apply to affected area once (Patient taking differently: Apply 1 application topically daily as needed (port flush). )  . methotrexate 2.5 MG tablet Take 7.5 mg by mouth once a week.   . ondansetron (ZOFRAN) 8 MG tablet Take 1 tablet (8 mg total) by mouth 2 (two) times daily as needed for refractory nausea / vomiting. Start on day 3 after carboplatin chemo.  . sodium chloride (OCEAN) 0.65 % SOLN nasal spray Place 1 spray into both nostrils at bedtime as needed for congestion.   . traMADol (ULTRAM) 50 MG tablet Take 25 mg by mouth 2 (two) times daily.   . [DISCONTINUED] apixaban (ELIQUIS) 5 MG TABS tablet Take 5 mg by mouth 2 (two) times daily.  . [DISCONTINUED] BREZTRI AEROSPHERE 160-9-4.8 MCG/ACT AERO INHALE 2 PUFFS INTO THE LUNGS TWICE DAILY  . [DISCONTINUED] Budeson-Glycopyrrol-Formoterol (BREZTRI AEROSPHERE) 160-9-4.8 MCG/ACT AERO Inhale 2 puffs into the lungs in the morning and at bedtime.  . [DISCONTINUED] diltiazem (CARDIZEM) 30 MG tablet Take 1 tablet (30 mg) by mouth every 8 hours as directed  . [DISCONTINUED] fenofibrate (TRICOR) 145 MG tablet Take 145 mg by mouth daily.  . [DISCONTINUED] predniSONE (DELTASONE) 10 MG tablet 40mg  x2d, 30mg  x2d, 20mg  x2d, 10mg  x2d   Immunization History  Administered Date(s) Administered  . Influenza, High Dose Seasonal PF 12/15/2016, 12/08/2017, 10/26/2018  . Influenza, Seasonal, Injecte, Preservative Fre 12/09/2017  . Influenza,inj,Quad PF,6+ Mos 12/15/2016  . Influenza-Unspecified 11/24/2015, 11/30/2019  . PFIZER SARS-COV-2 Vaccination  03/03/2019, 03/24/2019  . Pneumococcal Conjugate-13 01/30/2014  . Pneumococcal Polysaccharide-23 11/25/2015  . Td 05/04/2016  . Tdap 05/04/2016  . Zoster 10/21/2011  . Zoster Recombinat (Shingrix) 09/17/2018, 11/18/2018       Objective:   Physical Exam BP 102/60 (BP Location: Left Arm, Cuff Size: Normal)   Pulse 75   Temp 97.7 F (36.5 C) (Temporal)   Ht 5\' 6"  (1.676 m)   Wt 152 lb 12.8 oz (69.3 kg)   SpO2 96%   BMI 24.66 kg/m  GENERAL: Well-developed, well-nourished male in no acute distress, he is wearing oxygen at 4 L/min.  Presents in transport chair. HEAD: Normocephalic, atraumatic.  EYES: Pupils equal, round, reactive to light.  No scleral icterus.  MOUTH: Nose/mouth/throat not examined due to masking requirements for COVID 19. NECK: Supple. No thyromegaly. Trachea midline. No JVD.  No adenopathy. PULMONARY: Good air entry bilaterally.  Coarse, no wheezes noted.  No rhonchi.  CARDIOVASCULAR: S1 and S2. Regular rate and rhythm.  ABDOMEN: Benign. MUSCULOSKELETAL: No joint deformity, no clubbing, no edema.  NEUROLOGIC: No overt focal deficit, speech is fluent. SKIN: Intact,warm,dry. PSYCH: Depressed mood, normal behavior.   Chest x-ray of 06 October 2019, independently reviewed, opacity in the right lung field consistent with pneumonia:    Representative slices of CT chest 05 December 2019, independently reviewed:  New nodular opacity, RUL:   Residual infiltrate right lower lobe, severe emphysema throughout lungs    New lingular nodule:     Severe emphysema and pulmonary fibrosis:     Hepatic metastatic disease:    Assessment & Plan:     ICD-10-CM   1. Shortness of breath  R06.02 ECHOCARDIOGRAM COMPLETE   Multifactorial Flare of COPD Recent pneumonia Possible worsening cardiac function  2. Lobar pneumonia, unspecified organism (Darryl White)  J18.1    Take doxycycline as previously prescribed Prednisone 20 mg daily until seen again  3. COPD with acute  exacerbation (HCC)  J44.1    Continue Breztri Continue nebulizers Antibiotics and prednisone as above  4. Small cell lung cancer (HCC)  C34.90 NM PET Image Restage (PS) Skull Base to Thigh   Appears recurrent and extensive PET/CT Consider palliative/hospice referral  5. IPF (idiopathic pulmonary fibrosis) (HCC)  J84.112    Superimposed on severe emphysema This issue adds complexity to his management  6. Persistent atrial fibrillation (HCC)  I48.19    This issue adds complexity to his management On Eliquis, Cardizem and flecainide  7. Grade II diastolic dysfunction  K02.54    This issue adds complexity to his management Check 2D echo May be adding to his sensation of dyspnea   Orders Placed This Encounter  Procedures  . NM PET Image Restage (PS) Skull Base to Thigh    Standing Status:   Future    Standing Expiration Date:   12/07/2020    Order Specific Question:   If indicated for the ordered procedure, I authorize the administration of a radiopharmaceutical per Radiology protocol    Answer:   Yes    Order Specific Question:   Preferred imaging location?    Answer:   [facility]  . ECHOCARDIOGRAM COMPLETE    Standing Status:   Future    Standing Expiration Date:   06/07/2020    Order Specific Question:   Where should this test be performed    Answer:   [facility]    Order Specific Question:   Please indicate who you request to read the echo results.    Answer:   [facility]    Order Specific Question:   Perflutren DEFINITY (image enhancing agent) should be administered unless hypersensitivity or allergy exist    Answer:   Administer Perflutren    Order Specific Question:   Reason for exam-Echo    Answer:   Dyspnea  786.09 / R06.00    Order Specific Question:   Reason for exam-Echo    Answer:   Cardiomyopathy-Unspecified  425.9 / I42.9   Meds ordered this encounter  Medications  . predniSONE (DELTASONE) 20 MG tablet    Sig: Take 1 tablet daily or as  directed by physician    Dispense:  30 tablet    Refill:  1   Discussion:  Patient presents with increasing dyspnea.  It appears that he had an episode of pneumonia which has left significant residual.  He was prescribed antibiotics before reasons that he cannot explain he fill the prescription however did not  take the antibiotics.  It was recommended that he start taking them now.  We will also place him on prednisone 20 mg daily until seen again on 2 November.  He is to call us prior to that time should any new difficulties arise.  He is to continue oxygen supplementation at 4 L/min.  Continue using Breztri and as needed albuterol. We will also obtain 2D echo to exclude potential cardiac causes of dyspnea.  He has a known history of diastolic dysfunction and has had recent cardiac decompensation with atrial fib.  Peers that the patient has extensive recurrent small cell cancer.  This was discussed with him.  Will obtain PET/CT.  He is to see medical and radiation oncology tomorrow.  I have recommended that he also discuss potential palliative/hospice care.  Pending what his decision is in this regard can discontinue diagnostic testing if necessary.  Total time of visit today: 55 minutes  C. Derrill Kay, MD [facility]   *This note was dictated using voice recognition software/Dragon.  Despite best efforts to proofread, errors can occur which can change the meaning.  Any change was purely unintentional.

## 2019-12-09 ENCOUNTER — Ambulatory Visit
Admission: RE | Admit: 2019-12-09 | Discharge: 2019-12-09 | Disposition: A | Discharge status: Home / Self Care | Payer: Medicare PPO | Source: Ambulatory Visit | Attending: Radiation Oncology | Admitting: Radiation Oncology

## 2019-12-09 ENCOUNTER — Encounter: Payer: Self-pay | Admitting: Radiation Oncology

## 2019-12-09 ENCOUNTER — Inpatient Hospital Stay: Payer: Medicare PPO

## 2019-12-09 ENCOUNTER — Encounter: Payer: Self-pay | Admitting: Oncology

## 2019-12-09 ENCOUNTER — Inpatient Hospital Stay: Payer: Medicare PPO | Attending: Oncology | Admitting: Oncology

## 2019-12-09 ENCOUNTER — Inpatient Hospital Stay (HOSPITAL_BASED_OUTPATIENT_CLINIC_OR_DEPARTMENT_OTHER): Payer: Medicare PPO | Admitting: Hospice and Palliative Medicine

## 2019-12-09 VITALS — BP 118/74 | HR 77 | Temp 96.3°F | Resp 24 | Wt 153.4 lb

## 2019-12-09 DIAGNOSIS — I700 Atherosclerosis of aorta: Secondary | ICD-10-CM | POA: Insufficient documentation

## 2019-12-09 DIAGNOSIS — J9601 Acute respiratory failure with hypoxia: Secondary | ICD-10-CM | POA: Insufficient documentation

## 2019-12-09 DIAGNOSIS — Z818 Family history of other mental and behavioral disorders: Secondary | ICD-10-CM | POA: Insufficient documentation

## 2019-12-09 DIAGNOSIS — J440 Chronic obstructive pulmonary disease with acute lower respiratory infection: Secondary | ICD-10-CM | POA: Diagnosis not present

## 2019-12-09 DIAGNOSIS — R54 Age-related physical debility: Secondary | ICD-10-CM

## 2019-12-09 DIAGNOSIS — Z9049 Acquired absence of other specified parts of digestive tract: Secondary | ICD-10-CM | POA: Diagnosis not present

## 2019-12-09 DIAGNOSIS — Z515 Encounter for palliative care: Secondary | ICD-10-CM | POA: Diagnosis not present

## 2019-12-09 DIAGNOSIS — Z7901 Long term (current) use of anticoagulants: Secondary | ICD-10-CM | POA: Insufficient documentation

## 2019-12-09 DIAGNOSIS — C3431 Malignant neoplasm of lower lobe, right bronchus or lung: Secondary | ICD-10-CM | POA: Diagnosis present

## 2019-12-09 DIAGNOSIS — Z7189 Other specified counseling: Secondary | ICD-10-CM

## 2019-12-09 DIAGNOSIS — Z87891 Personal history of nicotine dependence: Secondary | ICD-10-CM | POA: Insufficient documentation

## 2019-12-09 DIAGNOSIS — C3490 Malignant neoplasm of unspecified part of unspecified bronchus or lung: Secondary | ICD-10-CM

## 2019-12-09 DIAGNOSIS — Z66 Do not resuscitate: Secondary | ICD-10-CM

## 2019-12-09 DIAGNOSIS — I4819 Other persistent atrial fibrillation: Secondary | ICD-10-CM | POA: Diagnosis not present

## 2019-12-09 DIAGNOSIS — Z880 Allergy status to penicillin: Secondary | ICD-10-CM | POA: Insufficient documentation

## 2019-12-09 DIAGNOSIS — E785 Hyperlipidemia, unspecified: Secondary | ICD-10-CM | POA: Insufficient documentation

## 2019-12-09 DIAGNOSIS — C771 Secondary and unspecified malignant neoplasm of intrathoracic lymph nodes: Secondary | ICD-10-CM | POA: Diagnosis not present

## 2019-12-09 DIAGNOSIS — C3411 Malignant neoplasm of upper lobe, right bronchus or lung: Secondary | ICD-10-CM | POA: Diagnosis present

## 2019-12-09 DIAGNOSIS — Z79899 Other long term (current) drug therapy: Secondary | ICD-10-CM | POA: Insufficient documentation

## 2019-12-09 DIAGNOSIS — J9611 Chronic respiratory failure with hypoxia: Secondary | ICD-10-CM | POA: Insufficient documentation

## 2019-12-09 DIAGNOSIS — C778 Secondary and unspecified malignant neoplasm of lymph nodes of multiple regions: Secondary | ICD-10-CM | POA: Diagnosis not present

## 2019-12-09 DIAGNOSIS — R040 Epistaxis: Secondary | ICD-10-CM | POA: Diagnosis not present

## 2019-12-09 DIAGNOSIS — R634 Abnormal weight loss: Secondary | ICD-10-CM | POA: Insufficient documentation

## 2019-12-09 DIAGNOSIS — J849 Interstitial pulmonary disease, unspecified: Secondary | ICD-10-CM | POA: Diagnosis not present

## 2019-12-09 DIAGNOSIS — R109 Unspecified abdominal pain: Secondary | ICD-10-CM | POA: Insufficient documentation

## 2019-12-09 DIAGNOSIS — Z923 Personal history of irradiation: Secondary | ICD-10-CM | POA: Insufficient documentation

## 2019-12-09 DIAGNOSIS — Z882 Allergy status to sulfonamides status: Secondary | ICD-10-CM | POA: Diagnosis not present

## 2019-12-09 DIAGNOSIS — R5383 Other fatigue: Secondary | ICD-10-CM | POA: Diagnosis not present

## 2019-12-09 DIAGNOSIS — I2782 Chronic pulmonary embolism: Secondary | ICD-10-CM | POA: Insufficient documentation

## 2019-12-09 DIAGNOSIS — C787 Secondary malignant neoplasm of liver and intrahepatic bile duct: Secondary | ICD-10-CM | POA: Insufficient documentation

## 2019-12-09 DIAGNOSIS — J90 Pleural effusion, not elsewhere classified: Secondary | ICD-10-CM | POA: Diagnosis not present

## 2019-12-09 DIAGNOSIS — Z801 Family history of malignant neoplasm of trachea, bronchus and lung: Secondary | ICD-10-CM | POA: Insufficient documentation

## 2019-12-09 DIAGNOSIS — C7801 Secondary malignant neoplasm of right lung: Secondary | ICD-10-CM | POA: Diagnosis not present

## 2019-12-09 DIAGNOSIS — Z9221 Personal history of antineoplastic chemotherapy: Secondary | ICD-10-CM | POA: Insufficient documentation

## 2019-12-09 DIAGNOSIS — R0609 Other forms of dyspnea: Secondary | ICD-10-CM | POA: Diagnosis not present

## 2019-12-09 DIAGNOSIS — K219 Gastro-esophageal reflux disease without esophagitis: Secondary | ICD-10-CM | POA: Diagnosis not present

## 2019-12-09 DIAGNOSIS — Z95828 Presence of other vascular implants and grafts: Secondary | ICD-10-CM

## 2019-12-09 DIAGNOSIS — Z8249 Family history of ischemic heart disease and other diseases of the circulatory system: Secondary | ICD-10-CM | POA: Insufficient documentation

## 2019-12-09 MED ORDER — HEPARIN SOD (PORK) LOCK FLUSH 100 UNIT/ML IV SOLN
500.0000 [IU] | Freq: Once | INTRAVENOUS | Status: AC
  Administered 2019-12-09: 500 [IU] via INTRAVENOUS
  Filled 2019-12-09: qty 5

## 2019-12-09 MED ORDER — SODIUM CHLORIDE 0.9% FLUSH
10.0000 mL | Freq: Once | INTRAVENOUS | Status: AC
  Administered 2019-12-09: 10 mL via INTRAVENOUS
  Filled 2019-12-09: qty 10

## 2019-12-09 NOTE — Progress Notes (Signed)
Radiation Oncology Follow up Note  Name: Darryl White   Date:   12/09/2019 MRN:  [id number] DOB: 1940/03/21    This 79 y.o. 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  He developed progression with mediastinal nodes as well as a new nodule in the right upper lobe both of these areas were hypermetabolic.  We treated him as a stage III and deliver 7000 centigrade using IMRT treatment planning and delivery.  Seen today in routine follow-up he is doing poor.  He continues to have difficulty with breathing fatigue and abdominal pain.Marland Kitchen  Recent CT scan showed right upper and right lower lobe airspace showing consolidation likely residual pneumonia he has developed a pulmonary nodule including a dominant lingular nodule suspicious for metastatic disease.  He has further decrease in previously described mediastinal node.  Unfortunately he is showing progression of his liver metastasis.  He is seeing Dr. Tasia Catchings today  COMPLICATIONS OF TREATMENT: none  FOLLOW UP COMPLIANCE: keeps appointments   PHYSICAL EXAM:  BP 118/74 (BP Location: Left Arm, Patient Position: Sitting)   Pulse 77   Temp (!) 96.3 F (35.7 C) (Tympanic)   Resp (!) 24   Wt 153 lb 6.4 oz (69.6 kg)   BMI 24.76 kg/m  [place] male wheelchair-bound on nasal so oxygen in NAD.  Well-developed well-nourished patient in NAD. HEENT reveals PERLA, EOMI, discs not visualized.  Oral cavity is clear. No oral mucosal lesions are identified. Neck is clear without evidence of cervical or supraclavicular adenopathy. Lungs are clear to A&P. Cardiac examination is essentially  unremarkable with regular rate and rhythm without murmur rub or thrill. Abdomen is benign with no organomegaly or masses noted. Motor sensory and DTR levels are equal and symmetric in the upper and lower extremities. Cranial nerves II through XII are grossly intact. Proprioception is intact. No peripheral adenopathy or edema is identified. No motor or sensory levels are noted. Crude visual fields are within normal range.  RADIOLOGY RESULTS: CT scan of chest abdomen pelvis reviewed compatible with above-stated findings  PLAN: Present time patient has obvious progression of disease.  Will see Dr. Tasia Catchings today for consideration of systemic therapy which may include a combination of chemotherapy and immunotherapy.  I have asked to see the patient back in 4 to 5 months for follow-up.  Would be happy to reevaluate him at any time should further palliative treatment be indicated.  I would like to take this opportunity to thank you for allowing me to participate in the care of your patient.Noreene Filbert, MD

## 2019-12-09 NOTE — Progress Notes (Signed)
Hematology/Oncology follow up note [facility] [phone] [phone]) [phone]   Patient Care Team: Pleas Koch, NP as PCP - General (Internal Medicine) Deboraha Sprang, MD as Consulting Physician (Cardiology) Marlowe Sax, MD as Referring Physician (Internal Medicine) Birder Robson, MD as Referring Physician (Ophthalmology) Oneta Rack, MD as Referring Physician (Dermatology) Salli Real, DDS as Referring Physician (Dentistry) Telford Nab, RN as Oncology Nurse Navigator Earlie Server, MD as Consulting Physician (Hematology and Oncology)  REFERRING PROVIDER: Pleas Koch, NP  CHIEF COMPLAINTS/REASON FOR VISIT:  Follow-up for management of lung cancer  HISTORY OF PRESENTING ILLNESS:   Darryl White is a  79 y.o.  male with PMH listed below was seen in consultation at the request of  Pleas Koch, NP  for evaluation of lung mass April 2020 patient was evaluated by radiation oncology Dr. Baruch Gouty for right lung nodules concerning for progressive malignancy.   Patient had progressively enlarging media right lower lobe which was previously treated in April 2020 empirically with SBRT.  Biopsy was not done due to poor lung function secondary to COPD/moderate fibrosis. Patient also had a right upper lobe nodule being closely watched, and was found to have progressively enlarged on 10/29/2018 CT chest. 02/27/2019 patient had another CT done which showed marked progression of precarinal lymph node and a continued progression of peripheral right upper lobe pulmonary nodule. 03/20/2019 PET scan showed hypermetabolic right lower paratracheal and subcarinal adenopathy with hypermetabolic right upper lobe subpleural nodule, or significantly worsened compared to prior CT scan. Patient also had 1.2 x 1 cm left upper lobe confluent groundglass densities with low metabolic activity. Patient was referred to establish care with oncology  for further evaluation and management.  Patient reports chronic shortness of breath with exertion.  He follows up with pulmonology and was recently seen by Dr. Patsey Berthold.  Denies any hemoptysis,.  He has a history of extensive smoking history, 67-pack-year, quit in 2019. He lives with wife at home. -CT head is negative. MRI is contraindicated due to pace marker.   # PET scan images were reviewed and discussed with patient. Also discussed with patient about pathology report Pre-carinal lymph node EBUS guided FNA showed malignancy, metastatic high-grade carcinoma. 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  There are patchy areas with focal weak staining for CD56-which is a neuroendocrine marker.  However many clusters of tumor cells are negative.  While the morphology alone would support a diagnosis of small cell carcinoma, there is insufficient immunohistochemistry evidence to support any specific tumor differentiation. I discussed the possibility of small cell lung cancer which is more aggressive in nature and is related with worse outcome. Regimen with concurrent radiation and chemotherapy with carboplatin and taxol would not be sufficient for small cell lung cancer treatment.We will treat the benefit of doubt using small cell lung cancer regimen, he agrees with the plan. I confirmed with Dr. 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  # Patient developed multiple epistaxis episodes during the interval.  Some episodes takes about 20 to 30 minutes to stop.  Patient called RN navigator and I advised patient to stop Eliquis.  Patient has been on Eliquis 5 mg twice daily due to cardiology diseases.  Patient has contacted cardiology and [name] recommend  patient to hold off  Eliquis for now  # Admitted from 05/27/2019-05/30/2019 due to neutropenic fever. He has an [place] of 0.1. Urine and blood culture remained negative. CT abdomen concerning for colitis. Patient was treated with empiric broad-spectrum antibiotics and his symptoms improved. His ANC improved to 0.6 and patient was discharged on Keflex and Flagyl for 3 more days to complete treatment of colitis. # 06/23/2019 finished radiation.   INTERVAL HISTORY Darryl White is a 79 y.o. male who has above history reviewed by me today presents for follow up acute visit for chemotherapy radiation for lung cancer-high-grade carcinoma  Problems and complaints are listed below: Patient was admitted 09/26/2019-09/28/2019 due to acute respiratory failure with hypoxia due to COPD exacerbation. Patient was treated with steroid, azithromycin and breathing treatments.  Patient was discharged home with nasal cannula oxygen.  Patient reports that his breathing never recovered to his baseline and he is now dependent on nasal cannula oxygen 4 L.   Patient has an appointment in September present 21 with repeat CT scan and he called and per further appointment to be moved out for couple of weeks for recovery. Patient has atrial fibrillation status post cardioversion in early September.  Patient called and canceled the scheduled CT scan and follow-up appointment.  Surveillance CT scan was rescheduled on 12/05/2019.  He was also seen by pulmonologist. Patient reports feeling weak and fatigued, progressively worsening shortness of breath. Appetite is fair.  He has lost some weight  Review of Systems  Constitutional: Positive for fatigue and unexpected weight change. Negative for appetite change, chills and fever.  HENT:   Negative for hearing loss, nosebleeds and voice change.   Eyes: Negative for eye problems and icterus.  Respiratory: Positive for shortness of breath. Negative for chest tightness and cough.    Cardiovascular: Negative for chest pain and leg swelling.  Gastrointestinal: Negative for abdominal distention and abdominal pain.  Endocrine: Negative for hot flashes.  Genitourinary: Negative for difficulty urinating, dysuria and frequency.   Musculoskeletal: Negative for arthralgias.  Skin: Negative for itching and rash.  Neurological: Negative for light-headedness and numbness.  Hematological: Negative for adenopathy. Does not bruise/bleed easily.  Psychiatric/Behavioral: Negative for confusion.    MEDICAL HISTORY:  Past Medical History:  Diagnosis Date  . Arthritis 2019   dx of osteoarthritis and rhemautoid arthritis  . Cancer ([place])    lung  . Cataract 2019   left eye  . Chicken pox   . Collagen vascular disease ([place])   . 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 COPD (chronic obstructive pulmonary disease) ([place])   . Diverticulitis   . GERD (gastroesophageal reflux disease)   . 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 History of hiatal hernia   . History of stomach ulcers   . Hyperlipidemia   . Lymphocytic colitis   . Orthostatic hypotension   . Osteoarthritis   . Pancreatitis   . Paroxysmal atrial fibrillation (HCC)   . Presence of permanent cardiac pacemaker   . 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 APPENDECTOMY    . BACK SURGERY     bone chip lasered  . CARDIAC CATHETERIZATION    . CARDIOVERSION N/A 10/24/2019   Procedure: CARDIOVERSION;  Surgeon: Minna Merritts, MD;  Location: [facility] ORS;  Service: Cardiovascular;  Laterality: N/A;  . CARDIOVERSION N/A 11/07/2019   Procedure: CARDIOVERSION;  Surgeon: Minna Merritts, MD;  Location: [facility] ORS;  Service: Cardiovascular;  Laterality: N/A;  . CATARACT EXTRACTION W/ INTRAOCULAR LENS IMPLANT Right   . COLON SURGERY  1990's   12" removed of large intestine  . EYE SURGERY    . INSERT / REPLACE / [place]  .  large intestine block    . PACEMAKER INSERTION     MDT dual chamber pacemaker  . PORTA CATH INSERTION N/A 04/17/2019   Procedure: PORTA CATH INSERTION;  Surgeon: Algernon Huxley, MD;  Location: [place] [facility];  Service: Cardiovascular;  Laterality: N/A;  . PPM GENERATOR CHANGEOUT N/A 12/02/2018   Procedure: PPM GENERATOR CHANGEOUT;  Surgeon: Deboraha Sprang, MD;  Location: [place] [facility];  Service: Cardiovascular;  Laterality: N/A;  . TONSILLECTOMY AND ADENOIDECTOMY    . VIDEO BRONCHOSCOPY WITH ENDOBRONCHIAL ULTRASOUND N/A 04/07/2019   Procedure: VIDEO BRONCHOSCOPY WITH ENDOBRONCHIAL ULTRASOUND;  Surgeon: Tyler Pita, MD;  Location: [facility] OR[facility];  Service: Pulmonary;  Laterality: N/A;    SOCIAL HISTORY: Social History   Socioeconomic History  . Marital status: Married    Spouse name: Not on file  . Number of children: 2  . Years of education: Not on file  . Highest education level: Not on file  Occupational History  . Occupation: Retired  Tobacco Use  . Smoking status: Former Smoker    Packs/day: 1.00    Years: 60.00    Pack years: 60.00    Types: Cigarettes    Quit date: 04/29/2017    Years since quitting: 2.6  . Smokeless tobacco: Never Used  Vaping Use  . Vaping Use: Never used  Substance and Sexual Activity  . Alcohol use: Not Currently    Alcohol/week: 1.0 standard drink    Types: 1 Standard drinks or equivalent per week    Comment: having one drink a day- Vodka with peach  . Drug use: No  . Sexual activity: Never  Other Topics Concern  . Not on file  Social History Narrative   Married.   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      Does not have a living will.   Desires CPR, does not want prolonged life support if futile.   Social Determinants of Health   Financial Resource Strain: Low Risk   . Difficulty of Paying Living Expenses: Not hard at all  Food Insecurity: No Food Insecurity  . Worried About Charity fundraiser in the Last Year:  Never true  . Ran Out of Food in the Last Year: Never true  Transportation Needs: No Transportation Needs  . Lack of Transportation (Medical): No  . Lack of Transportation (Non-Medical): No  Physical Activity: Sufficiently Active  . Days of Exercise per Week: 7 days  . Minutes of Exercise per Session: 60 min  Stress: No Stress Concern Present  . Feeling of Stress : Not at all  Social Connections:   . Frequency of Communication with Friends and Family: Not on file  . Frequency of Social Gatherings with Friends and Family: Not on file  . Attends Religious Services: Not on file  . Active Member of Clubs or Organizations: Not on file  . Attends Archivist Meetings: Not on file  . Marital Status: Not on file  Intimate Partner Violence: Not At Risk  . Fear of Current or Ex-Partner: No  . Emotionally Abused: No  . Physically Abused: No  . Sexually Abused: No    FAMILY HISTORY: Family History  Problem Relation  Age of Onset  . Lung cancer Mother   . Heart disease Father   . Alzheimer's disease Father     ALLERGIES:  is allergic to sulfa antibiotics and penicillins.  MEDICATIONS:  Current Outpatient Medications  Medication Sig Dispense Refill  . acetaminophen (ACETAMINOPHEN 8 HOUR) 650 MG CR tablet Take 650 mg by mouth every 8 (eight) hours as needed for pain.    Marland Kitchen albuterol (PROAIR HFA) 108 (90 Base) MCG/ACT inhaler Inhale 1-2 puffs into the lungs every 6 (six) hours as needed for wheezing or shortness of breath. 18 g 5  . BREZTRI AEROSPHERE 160-9-4.8 MCG/ACT AERO INHALE 2 PUFFS INTO THE LUNGS TWICE DAILY 10.7 g 6  . brimonidine (ALPHAGAN) 0.15 % ophthalmic solution Place 1 drop into both eyes 2 (two) times daily.     . Cholecalciferol (VITAMIN D3) 50 MCG (2000 UT) capsule Take 1 capsule (2,000 Units total) by mouth daily with lunch. 30 capsule 1  . diltiazem (CARDIZEM CD) 120 MG 24 hr capsule Take 1 capsule (120 mg total) by mouth daily. 90 capsule 3  . doxycycline  (VIBRA-TABS) 100 MG tablet Take 1 tablet (100 mg total) by mouth 2 (two) times daily. 14 tablet 0  . ELIQUIS 5 MG TABS tablet TAKE 1 TABLET BY MOUTH TWICE DAILY (Patient taking differently: Take 5 mg by mouth 2 (two) times daily. ) 60 tablet 5  . famotidine (PEPCID) 20 MG tablet Take 20 mg by mouth 2 (two) times daily as needed for heartburn or indigestion.    . fenofibrate (TRICOR) 145 MG tablet TAKE 1 TABLET(145 MG) BY MOUTH DAILY (Patient taking differently: Take 145 mg by mouth daily. ) 90 tablet 1  . flecainide (TAMBOCOR) 100 MG tablet Take 1 tablet (100 mg total) by mouth 2 (two) times daily. 60 tablet 3  . folic acid (FOLVITE) 1 MG tablet Take 1 mg by mouth daily.    . furosemide (LASIX) 20 MG tablet Take 2 tablets (40 mg) by mouth once every other day    . gabapentin (NEURONTIN) 100 MG capsule Take 100 mg by mouth 3 (three) times daily.    Marland Kitchen ipratropium-albuterol (DUONEB) 0.5-2.5 (3) MG/3ML SOLN Take 3 mLs by nebulization every 6 (six) hours as needed. 360 mL 11  . lidocaine-prilocaine (EMLA) cream Apply to affected area once (Patient taking differently: Apply 1 application topically daily as needed (port flush). ) 30 g 3  . methotrexate 2.5 MG tablet Take 7.5 mg by mouth once a week.     . predniSONE (DELTASONE) 20 MG tablet Take 1 tablet daily or as directed by physician 30 tablet 1  . sodium chloride (OCEAN) 0.65 % SOLN nasal spray Place 1 spray into both nostrils at bedtime as needed for congestion.     . traMADol (ULTRAM) 50 MG tablet Take 25 mg by mouth 2 (two) times daily.     . ondansetron (ZOFRAN) 8 MG tablet Take 1 tablet (8 mg total) by mouth 2 (two) times daily as needed for refractory nausea / vomiting. Start on day 3 after carboplatin chemo. (Patient not taking: Reported on 12/09/2019) 30 tablet 1   No current facility-administered medications for this visit.     PHYSICAL EXAMINATION: ECOG PERFORMANCE STATUS: 1 - Symptomatic but completely ambulatory Vitals:   12/09/19  1409  BP: 118/74  Pulse: 77  Resp: (!) 24  Temp: (!) 96.3 F (35.7 C)   Filed Weights   12/09/19 1409  Weight: 153 lb 6.4 oz (69.6 kg)  Physical Exam Vitals reviewed.  Constitutional:      General: He is not in acute distress.    Comments: Patient sits in the wheelchair  HENT:     Head: Normocephalic and atraumatic.  Eyes:     General: No scleral icterus. Cardiovascular:     Rate and Rhythm: Normal rate and regular rhythm.     Heart sounds: Normal heart sounds.  Pulmonary:     Effort: Pulmonary effort is normal. No respiratory distress.     Breath sounds: No wheezing or rhonchi.     Comments: Patient has bibasilar fine crackles  Abdominal:     General: Bowel sounds are normal. There is no distension.     Palpations: Abdomen is soft.  Musculoskeletal:        General: No deformity. Normal range of motion.     Cervical back: Normal range of motion and neck supple.  Skin:    General: Skin is warm and dry.     Findings: No erythema or rash.  Neurological:     Mental Status: He is alert and oriented to person, place, and time. Mental status is at baseline.     Cranial Nerves: No cranial nerve deficit.     Coordination: Coordination normal.  Psychiatric:        Mood and Affect: Mood normal.     LABORATORY DATA:  I have reviewed the data as listed Lab Results  Component Value Date   WBC 7.7 10/28/2019   HGB 13.5 10/28/2019   HCT 37.7 (L) 10/28/2019   MCV 101.1 (H) 10/28/2019   PLT 192 10/28/2019   Recent Labs    07/25/19 1322 07/25/19 1322 09/26/19 1118 09/27/19 0634 09/28/19 0440 09/28/19 0440 10/20/19 1100 10/28/19 0901 11/05/19 0948  NA 136   < > 135   < > 136   < > 130* 131* 132*  K 4.0   < > 4.9   < > 5.3*   < > 5.3* 3.8 3.6  CL 103   < > 100   < > 103   < > 95* 97* 92*  CO2 25   < > 24   < > 25   < > 26 27 30   GLUCOSE 95   < > 107*   < > 148*   < > 134* 133* 112*  BUN 18   < > 19   < > 25*   < > 17 18 22   CREATININE 1.20   < > 1.08   < > 1.06    < > 0.95 0.85 1.10  CALCIUM 9.1   < > 9.6   < > 9.1   < > 9.2 8.4* 9.1  GFRNONAA 58*   < > >60   < > >60   < > >60 >60 >60  GFRAA >60   < > >60   < > >60   < > >60 >60 >60  PROT 7.1  --  7.6  --   --   --   --  5.9*  --   ALBUMIN 3.9   < > 4.1  --  3.4*  --   --  2.8*  --   AST 39  --  40  --   --   --   --  65*  --   ALT 19  --  21  --   --   --   --  39  --   ALKPHOS 110  --  95  --   --   --   --  141*  --   BILITOT 0.8  --  1.2  --   --   --   --  0.6  --    < > = values in this interval not displayed.   Iron/TIBC/Ferritin/ %Sat    Component Value Date/Time   IRON 80 09/28/2019 0440   TIBC 339 09/28/2019 0440   FERRITIN 216 09/28/2019 0440   IRONPCTSAT 24 09/28/2019 0440      RADIOGRAPHIC STUDIES: I have personally reviewed the radiological images as listed and agreed with the findings in the report.  CT CHEST ABDOMEN PELVIS W CONTRAST  Result Date: 12/05/2019 CLINICAL DATA:  Restaging of small-cell lung cancer diagnosed 9/20. Status post chemotherapy and radiation therapy. Pneumonia diagnosed in August with cough, congestion, shortness of breath. EXAM: CT CHEST, ABDOMEN, AND PELVIS WITH CONTRAST TECHNIQUE: Multidetector CT imaging of the chest, abdomen and pelvis was performed following the standard protocol during bolus administration of intravenous contrast. CONTRAST:  159mL OMNIPAQUE IOHEXOL 300 MG/ML  SOLN COMPARISON:  Chest radiograph 10/06/2019.  Prior CT 07/23/2019. FINDINGS: CT CHEST FINDINGS Cardiovascular: Dual lead pacer and right sided Port-A-Cath. The Port-A-Cath terminates at the superior caval/atrial junction. Aortic and branch vessel atherosclerosis. Mild cardiomegaly, without pericardial effusion. The previously described right-sided pulmonary embolism is not readily apparent. No central pulmonary embolism, on this non-dedicated study. Lad and right coronary artery calcification. Mediastinum/Nodes: No supraclavicular adenopathy. The low right paratracheal node of  interest measures 7 mm on 28/2 versus 10 mm on the prior exam. No hilar adenopathy. Lungs/Pleura: Small right pleural effusion, new. Advanced emphysema. Superimposed interstitial lung disease, basilar predominant with honeycombing indicative of usual interstitial pneumonitis. Posterior right upper lobe consolidation is likely residual infection, given the clinical history. There is also dependent right lower lobe airspace disease, favoring infection. Posterior lingular nodule measures 2.4 x 2.5 cm and is new on 90/3. New left lower lobe 1.0 cm nodular density on 103/3. Other scattered pulmonary nodular densities which are new. Example in the lingula at 9 mm on 112/3, inferior right upper lobe at 8 mm on 72/3, anteromedial right upper lobe at 6 mm on 69/3. Musculoskeletal: No acute osseous abnormality. CT ABDOMEN PELVIS FINDINGS Hepatobiliary: Significant enlargement of previously described central high right hepatic lobe lesion. Heterogeneously hypoattenuating with suggestion of peripheral enhancement at 4.8 x 4.6 cm on 47/2. On the order of 1.2 cm on the prior exam. Heterogeneous enhancement within the more peripheral right hepatic lobe including on 52/2 with possible hypoattenuating contiguous or adjacent area of 1.6 cm on 56/2. New or enlarged inferior right hepatic lobe 1.4 cm lesion on 60/2. Nonspecific gallbladder mucosal hyperenhancement. No calcified stone or biliary duct dilatation. Pancreas: Normal, without mass or ductal dilatation. Spleen: Normal in size, without focal abnormality. Adrenals/Urinary Tract: Normal adrenal glands. Interpolar left renal lesions again identified. The exophytic lesion measures 1.3 cm and greater than fluid density on 76/2, similar in size to on the prior. Normal right kidney. No hydronephrosis. Normal urinary bladder. Stomach/Bowel: Apparent soft tissue fullness about the gastric cardia including on 54/2 is at least partially felt to be due to underdistention. Relatively  similar to on the prior. Pulmonary nodules of maximally volume derived equivalent diameter Normal terminal ileum. Normal small bowel. Vascular/Lymphatic: Advanced aortic and branch vessel atherosclerosis. No abdominopelvic adenopathy. Reproductive: Normal prostate. Other: No significant free fluid. No evidence of omental or peritoneal disease. Musculoskeletal: No acute osseous abnormality. IMPRESSION: CT CHEST IMPRESSION 1. Right upper and right lower lobe airspace disease/consolidation, likely residual pneumonia. Concurrent  small right pleural effusion. 2. Development of pulmonary nodularity, including a dominant lingular nodule, highly suspicious for metastatic disease. 3. Further decrease in size of the previously described mediastinal node. 4. Emphysema (ICD10-J43.9). 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 5. Coronary artery atherosclerosis. Aortic Atherosclerosis (ICD10-I70.0). CT ABDOMEN AND PELVIS IMPRESSION 1. Progressive hepatic metastasis. 2. No extrahepatic abdominopelvic metastatic disease. 3. Similar size of the indeterminate left renal lesion. 4. Similar soft tissue fullness within the gastric cardia, at least partially felt to be due to underdistention. Correlate with upper abdominal complaints. Of note, this area was not significantly hypermetabolic on 67/61/9509 Electronically Signed   By: Abigail Miyamoto M.D.   On: 12/05/2019 12:43   CUP PACEART INCLINIC DEVICE CHECK  Result Date: 11/11/2019 Pacemaker check in clinic. Normal device function. RA threshold, sensing, impedances consistent with previous measurements. RV sensing and impedance trends stable, RV threshold increased in recent weeks, correlates with addition of flecainide, output adjusted to 2.5V @ 0.56ms with auto capture on monitor only. Device programmed to maximize longevity. 2 AF episodes and 13 fast A&V episodes--all due to persistent AF mid-August to early September, on Eliquis. 10 HVRs,  all appear atrially-driven. Device programmed at appropriate safety margins. Histogram distribution appropriate for patient activity level. Device programmed to optimize intrinsic conduction. Estimated longevity 13.9 years. Patient enrolled in remote follow-up. Patient education completed. Carelink on 01/16/20 and ROV with SK/B in 3 months.     ASSESSMENT & PLAN:  1. 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 Chronic respiratory failure with hypoxia (HCC)   3. Goals of care, counseling/discussion   4. Chronic anticoagulation    #Right lung high-grade poorly differentiated carcinoma-suspect small cell lung cancer.  Clinically T1c N2 M0.  Finished concurrent chemoradiation-carboplatin and etoposide 12/05/2019 Repeat CT chest abdomen pelvis showed a right upper and right lower lobe airspace disease/consolidation, likely residual pneumonia.  Right pleural effusion.  Development of pulmonary nodularity including involvement inguinal nodule, highly suspicious for metastasis.  Further decrease in the size of previously described mediastinal node.  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 Progressive hepatic metastasis with liver lesion 4.8 x 4.6 cm.  Image was reviewed and discussed with patient and his wife.  Patient has recently been seen by pulmonologist and was started on steroids, course of antibiotic treatments.  I encourage patient to finish the treatments to see if he gets any improvement with his breathing. I discussed with him about the suspicion of cancer recurrence.  Ultrasound-guided liver biopsy can be considered to confirm distant metastasis as well as potential confirmation of small cell lung cancer histology. I agree with PET scan for further evaluation.  Dr. Patsey Berthold also ordered echocardiogram for evaluation of his heart function which may also contribute to the CT findings. We discussed about treatment options for small cell lung  cancer recurrence which happened about 6 months after his completion of concurrent chemoradiation.  He may be resumed with carboplatin/etoposide retreatment, topotecan,lurbinectedin, with immunotherapy single agent can be considered and later line treatments.  Response rate is about 30%, however I am very concerned about his current performance status ( ECOG 3)is very poor and he may not tolerate aggressive chemotherapy treatments.  Prognosis is overall very poor for relapsed small cell lung cancer.  I discussed about palliative care treatment with comfort care and hospice.  Patient will need palliative care service today for further discussion.  Right lobe subsegmental chronic pulmonary embolus, he is on Eliquis 5mg  BID.  He also has atrial fibrillation.  All questions were answered. The patient knows to call the clinic with any problems questions or concerns. Follow up appointment: To be determined    Earlie Server, MD, PhD Hematology Oncology [facility] Pager- [phone] 12/09/2019

## 2019-12-09 NOTE — Progress Notes (Signed)
[place]  [phone] [phone]   Name: Darryl White Date: 12/09/2019 MRN: [id number]  DOB: 01-Mar-1940  Patient Care Team: Pleas Koch, NP as PCP - General (Internal Medicine) Deboraha Sprang, MD as Consulting Physician (Cardiology) Marlowe Sax, MD as Referring Physician (Internal Medicine) Birder Robson, MD as Referring Physician (Ophthalmology) Oneta Rack, MD as Referring Physician (Dermatology) Salli Real, DDS as Referring Physician (Dentistry) Telford Nab, RN as Oncology Nurse Navigator Earlie Server, MD as Consulting Physician (Hematology and Oncology)    REASON FOR CONSULTATION: Darryl White is a 79 y.o. male with multiple medical problems including history of lung mass empirically treated with SBRT with deferred biopsy due to COPD/pulmonary fibrosis.  Patient was found to have progressive enlargement of lung malignancy.  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  Patient is status post  carboplatin/etoposide.  CT scan on 12/05/2019 revealed progressive liver metastasis.  Hospice was recommended.  Palliative care was consulted to help address goals and manage ongoing symptoms.  SOCIAL HISTORY:     reports that he quit smoking about 2 years ago. His smoking use included cigarettes. He has a 60.00 pack-year smoking history. He has never used smokeless tobacco. He reports previous alcohol use of about 1.0 standard drink of alcohol per week. He reports that he does not use drugs.   Patient is married and lives with his wife in a townhome.  Patient moved here some years ago from [place], [place] to be near his son, who lives in [place].  Patient also has a daughter who lives in [place].  Patient retired  as an Nature conservation officer. He is active in the [employer].   ADVANCE DIRECTIVES:  Not on file  CODE STATUS: DNR/DNI (DNR form signed on 12/09/2019)  PAST MEDICAL HISTORY: Past Medical History:  Diagnosis Date  . Arthritis 2019   dx of osteoarthritis and rhemautoid arthritis  . Cancer ([place])    lung  . Cataract 2019   left eye  . Chicken pox   . Collagen vascular disease ([place])   . 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 COPD (chronic obstructive pulmonary disease) ([place])   . Diverticulitis   . GERD (gastroesophageal reflux disease)   . 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 History of hiatal hernia   . History of stomach ulcers   . Hyperlipidemia   . Lymphocytic colitis   . Orthostatic hypotension   . Osteoarthritis   . Pancreatitis   . Paroxysmal atrial fibrillation (HCC)   . Presence of permanent cardiac pacemaker   . 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 APPENDECTOMY    . BACK SURGERY     bone chip lasered  . CARDIAC CATHETERIZATION    . CARDIOVERSION N/A 10/24/2019   Procedure: CARDIOVERSION;  Surgeon: Minna Merritts, MD;  Location: [facility] ORS;  Service: Cardiovascular;  Laterality: N/A;  . CARDIOVERSION N/A 11/07/2019   Procedure: CARDIOVERSION;  Surgeon: Minna Merritts, MD;  Location: [facility] ORS;  Service: Cardiovascular;  Laterality: N/A;  . CATARACT EXTRACTION W/ INTRAOCULAR LENS IMPLANT Right   . COLON SURGERY  1990's   12" removed of large intestine  .  EYE SURGERY    . INSERT / REPLACE / [place]  . large intestine block    . PACEMAKER INSERTION     MDT dual chamber pacemaker  . PORTA CATH INSERTION N/A 04/17/2019   Procedure: PORTA CATH INSERTION;  Surgeon: Algernon Huxley, MD;  Location: [place] [facility];  Service: Cardiovascular;  Laterality: N/A;  . PPM GENERATOR CHANGEOUT N/A 12/02/2018    Procedure: PPM GENERATOR CHANGEOUT;  Surgeon: Deboraha Sprang, MD;  Location: [place] [facility];  Service: Cardiovascular;  Laterality: N/A;  . TONSILLECTOMY AND ADENOIDECTOMY    . 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[place])  04/15/2019 Initial Diagnosis   Small cell lung cancer ([place])   04/23/2019 -  Chemotherapy   The patient had dexamethasone (DECADRON) 4 MG tablet, 8 mg, Oral, Daily, 1 of 1 cycle, Start date: 04/15/2019, End date: 05/27/2019 palonosetron (ALOXI) injection 0.25 mg, 0.25 mg, Intravenous,  Once, 3 of 3 cycles Administration: 0.25 mg (04/23/2019), 0.25 mg (05/14/2019), 0.25 mg (06/04/2019) CARBOplatin (PARAPLATIN) 410 mg in sodium chloride 0.9 % 250 mL chemo infusion, 410 mg (100 % of original dose 409.5 mg), Intravenous,  Once, 3 of 3 cycles Dose modification:   (original dose 409.5 mg, Cycle 1) Administration: 410 mg (04/23/2019), 450 mg (05/14/2019), 370 mg (06/04/2019) etoposide (VEPESID) 190 mg in sodium chloride 0.9 % 500 mL chemo infusion, 100 mg/m2 = 190 mg, Intravenous,  Once, 3 of 3 cycles Administration: 190 mg (04/23/2019), 190 mg (04/24/2019), 190 mg (04/25/2019), 190 mg (05/14/2019), 190 mg (05/15/2019), 190 mg (05/16/2019), 190 mg (06/04/2019), 190 mg (06/05/2019), 190 mg (06/06/2019) fosaprepitant (EMEND) 150 mg in sodium chloride 0.9 % 145 mL IVPB, 150 mg, Intravenous,  Once, 3 of 3 cycles Administration: 150 mg (04/23/2019), 150 mg (05/14/2019), 150 mg (06/04/2019)  for chemotherapy treatment.      ALLERGIES:  is allergic to sulfa antibiotics and penicillins.  MEDICATIONS:  Current Outpatient Medications  Medication Sig Dispense Refill  . acetaminophen (ACETAMINOPHEN 8 HOUR) 650 MG CR tablet Take 650 mg by mouth every 8  (eight) hours as needed for pain.    Marland Kitchen albuterol (PROAIR HFA) 108 (90 Base) MCG/ACT inhaler Inhale 1-2 puffs into the lungs every 6 (six) hours as needed for wheezing or shortness of breath. 18 g 5  . BREZTRI AEROSPHERE 160-9-4.8 MCG/ACT AERO INHALE 2 PUFFS INTO THE LUNGS TWICE DAILY 10.7 g 6  . brimonidine (ALPHAGAN) 0.15 % ophthalmic solution Place 1 drop into both eyes 2 (two) times daily.     . Cholecalciferol (VITAMIN D3) 50 MCG (2000 UT) capsule Take 1 capsule (2,000 Units total) by mouth daily with lunch. 30 capsule 1  . diltiazem (CARDIZEM CD) 120 MG 24 hr capsule Take 1 capsule (120 mg total) by mouth daily. 90 capsule 3  . doxycycline (VIBRA-TABS) 100 MG tablet Take 1 tablet (100 mg total) by mouth 2 (two) times daily. 14 tablet 0  . ELIQUIS 5 MG TABS tablet TAKE 1 TABLET BY MOUTH TWICE DAILY (Patient taking differently: Take 5 mg by mouth 2 (two) times daily. ) 60 tablet 5  . famotidine (PEPCID) 20 MG tablet Take 20 mg by mouth 2 (two) times daily as needed for heartburn or indigestion.    . fenofibrate (  TRICOR) 145 MG tablet TAKE 1 TABLET(145 MG) BY MOUTH DAILY (Patient taking differently: Take 145 mg by mouth daily. ) 90 tablet 1  . flecainide (TAMBOCOR) 100 MG tablet Take 1 tablet (100 mg total) by mouth 2 (two) times daily. 60 tablet 3  . folic acid (FOLVITE) 1 MG tablet Take 1 mg by mouth daily.    . furosemide (LASIX) 20 MG tablet Take 2 tablets (40 mg) by mouth once every other day    . gabapentin (NEURONTIN) 100 MG capsule Take 100 mg by mouth 3 (three) times daily.    Marland Kitchen ipratropium-albuterol (DUONEB) 0.5-2.5 (3) MG/3ML SOLN Take 3 mLs by nebulization every 6 (six) hours as needed. 360 mL 11  . lidocaine-prilocaine (EMLA) cream Apply to affected area once (Patient taking differently: Apply 1 application topically daily as needed (port flush). ) 30 g 3  . methotrexate 2.5 MG tablet Take 7.5 mg by mouth once a week.     . ondansetron (ZOFRAN) 8 MG tablet Take 1 tablet (8 mg total)  by mouth 2 (two) times daily as needed for refractory nausea / vomiting. Start on day 3 after carboplatin chemo. (Patient not taking: Reported on 12/09/2019) 30 tablet 1  . predniSONE (DELTASONE) 20 MG tablet Take 1 tablet daily or as directed by physician 30 tablet 1  . sodium chloride (OCEAN) 0.65 % SOLN nasal spray Place 1 spray into both nostrils at bedtime as needed for congestion.     . traMADol (ULTRAM) 50 MG tablet Take 25 mg by mouth 2 (two) times daily.      No current facility-administered medications for this visit.    VITAL SIGNS: There were no vitals taken for this visit. There were no vitals filed for this visit.  Estimated body mass index is 24.76 kg/m as calculated from the following:   Height as of 12/08/19: 5\' 6"  (1.676 m).   Weight as of an earlier encounter on 12/09/19: 153 lb 6.4 oz (69.6 kg).  LABS: CBC:    Component Value Date/Time   WBC 7.7 10/28/2019 0901   HGB 13.5 10/28/2019 0901   HGB 15.3 09/11/2017 1119   HCT 37.7 (L) 10/28/2019 0901   HCT 44.2 09/11/2017 1119   PLT 192 10/28/2019 0901   PLT 201 09/11/2017 1119   MCV 101.1 (H) 10/28/2019 0901   MCV 101 (H) 09/11/2017 1119   NEUTROABS 6.3 10/28/2019 0901   NEUTROABS 3.4 09/11/2017 1119   LYMPHSABS 0.6 (L) 10/28/2019 0901   LYMPHSABS 1.7 09/11/2017 1119   MONOABS 0.5 10/28/2019 0901   EOSABS 0.3 10/28/2019 0901   EOSABS 0.1 09/11/2017 1119   BASOSABS 0.0 10/28/2019 0901   BASOSABS 0.1 09/11/2017 1119   Comprehensive Metabolic Panel:    Component Value Date/Time   NA 132 (L) 11/05/2019 0948   NA 136 09/11/2017 1119   K 3.6 11/05/2019 0948   CL 92 (L) 11/05/2019 0948   CO2 30 11/05/2019 0948   BUN 22 11/05/2019 0948   BUN 19 09/11/2017 1119   CREATININE 1.10 11/05/2019 0948   GLUCOSE 112 (H) 11/05/2019 0948   CALCIUM 9.1 11/05/2019 0948   AST 65 (H) 10/28/2019 0901   ALT 39 10/28/2019 0901   ALKPHOS 141 (H) 10/28/2019 0901   BILITOT 0.6 10/28/2019 0901   PROT 5.9 (L) 10/28/2019 0901    ALBUMIN 2.8 (L) 10/28/2019 0901    RADIOGRAPHIC STUDIES: CT CHEST ABDOMEN PELVIS W CONTRAST  Result Date: 12/05/2019 CLINICAL DATA:  Restaging of small-cell lung cancer diagnosed  9/20. Status post chemotherapy and radiation therapy. Pneumonia diagnosed in August with cough, congestion, shortness of breath. EXAM: CT CHEST, ABDOMEN, AND PELVIS WITH CONTRAST TECHNIQUE: Multidetector CT imaging of the chest, abdomen and pelvis was performed following the standard protocol during bolus administration of intravenous contrast. CONTRAST:  176mL OMNIPAQUE IOHEXOL 300 MG/ML  SOLN COMPARISON:  Chest radiograph 10/06/2019.  Prior CT 07/23/2019. FINDINGS: CT CHEST FINDINGS Cardiovascular: Dual lead pacer and right sided Port-A-Cath. The Port-A-Cath terminates at the superior caval/atrial junction. Aortic and branch vessel atherosclerosis. Mild cardiomegaly, without pericardial effusion. The previously described right-sided pulmonary embolism is not readily apparent. No central pulmonary embolism, on this non-dedicated study. Lad and right coronary artery calcification. Mediastinum/Nodes: No supraclavicular adenopathy. The low right paratracheal node of interest measures 7 mm on 28/2 versus 10 mm on the prior exam. No hilar adenopathy. Lungs/Pleura: Small right pleural effusion, new. Advanced emphysema. Superimposed interstitial lung disease, basilar predominant with honeycombing indicative of usual interstitial pneumonitis. Posterior right upper lobe consolidation is likely residual infection, given the clinical history. There is also dependent right lower lobe airspace disease, favoring infection. Posterior lingular nodule measures 2.4 x 2.5 cm and is new on 90/3. New left lower lobe 1.0 cm nodular density on 103/3. Other scattered pulmonary nodular densities which are new. Example in the lingula at 9 mm on 112/3, inferior right upper lobe at 8 mm on 72/3, anteromedial right upper lobe at 6 mm on 69/3.  Musculoskeletal: No acute osseous abnormality. CT ABDOMEN PELVIS FINDINGS Hepatobiliary: Significant enlargement of previously described central high right hepatic lobe lesion. Heterogeneously hypoattenuating with suggestion of peripheral enhancement at 4.8 x 4.6 cm on 47/2. On the order of 1.2 cm on the prior exam. Heterogeneous enhancement within the more peripheral right hepatic lobe including on 52/2 with possible hypoattenuating contiguous or adjacent area of 1.6 cm on 56/2. New or enlarged inferior right hepatic lobe 1.4 cm lesion on 60/2. Nonspecific gallbladder mucosal hyperenhancement. No calcified stone or biliary duct dilatation. Pancreas: Normal, without mass or ductal dilatation. Spleen: Normal in size, without focal abnormality. Adrenals/Urinary Tract: Normal adrenal glands. Interpolar left renal lesions again identified. The exophytic lesion measures 1.3 cm and greater than fluid density on 76/2, similar in size to on the prior. Normal right kidney. No hydronephrosis. Normal urinary bladder. Stomach/Bowel: Apparent soft tissue fullness about the gastric cardia including on 54/2 is at least partially felt to be due to underdistention. Relatively similar to on the prior. Pulmonary nodules of maximally volume derived equivalent diameter Normal terminal ileum. Normal small bowel. Vascular/Lymphatic: Advanced aortic and branch vessel atherosclerosis. No abdominopelvic adenopathy. Reproductive: Normal prostate. Other: No significant free fluid. No evidence of omental or peritoneal disease. Musculoskeletal: No acute osseous abnormality. IMPRESSION: CT CHEST IMPRESSION 1. Right upper and right lower lobe airspace disease/consolidation, likely residual pneumonia. Concurrent small right pleural effusion. 2. Development of pulmonary nodularity, including a dominant lingular nodule, highly suspicious for metastatic disease. 3. Further decrease in size of the previously described mediastinal node. 4. Emphysema  (ICD10-J43.9). 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 5. Coronary artery atherosclerosis. Aortic Atherosclerosis (ICD10-I70.0). CT ABDOMEN AND PELVIS IMPRESSION 1. Progressive hepatic metastasis. 2. No extrahepatic abdominopelvic metastatic disease. 3. Similar size of the indeterminate left renal lesion. 4. Similar soft tissue fullness within the gastric cardia, at least partially felt to be due to underdistention. Correlate with upper abdominal complaints. Of note, this area was not significantly hypermetabolic on 34/74/2595 Electronically Signed   By: Adria Devon.D.  On: 12/05/2019 12:43   CUP PACEART INCLINIC DEVICE CHECK  Result Date: 11/11/2019 Pacemaker check in clinic. Normal device function. RA threshold, sensing, impedances consistent with previous measurements. RV sensing and impedance trends stable, RV threshold increased in recent weeks, correlates with addition of flecainide, output adjusted to 2.5V @ 0.3ms with auto capture on monitor only. Device programmed to maximize longevity. 2 AF episodes and 13 fast A&V episodes--all due to persistent AF mid-August to early September, on Eliquis. 10 HVRs, all appear atrially-driven. Device programmed at appropriate safety margins. Histogram distribution appropriate for patient activity level. Device programmed to optimize intrinsic conduction. Estimated longevity 13.9 years. Patient enrolled in remote follow-up. Patient education completed. [facility] on 01/16/20 and ROV with SK/B in 3 months.   PERFORMANCE STATUS (ECOG) : 0 - Asymptomatic  Review of Systems Unless otherwise noted, a complete review of systems is negative.  Physical Exam General: NAD Pulmonary: Unlabored Extremities: no edema, no joint deformities Skin: no rashes Neurological: nonfocal  IMPRESSION: Routine follow-up visit.  Patient was accompanied by his wife.  Patient saw Dr. Patsey Berthold earlier this week and was started on  doxycycline but most likely his worsening pulmonary status is secondary to progression of cancer.  Hospice was recommended.  Patient saw Dr. Tasia Catchings earlier today and patient is felt to be too frail to sustain further treatment.  Unless he demonstrates significant clinical improvement on antibiotics, hospice was also recommended.  Patient is pending a PET scan and echocardiogram next week and wife would like for him to obtain this work-up if possible.  However, both patient and wife are in agreement with pursuing hospice following PET scan if there has been no clinical improvement.  Patient says that he will call and let us know of his decision.  We discussed CODE STATUS.  Patient says that he would not want to be resuscitated nor have his life prolonged artificially on machines.  He was in agreement with DNR/DNI.  I signed a DNR order for him to take home.  PLAN: -Recommend best supportive care -Patient to call next week regarding hospice referral -DNR/DNI  Case and plan discussed with Dr. Tasia Catchings   Patient expressed understanding and was in agreement with this plan. He also understands that He can call the clinic at any time with any questions, concerns, or complaints.     Time Total: 30 minutes  Visit consisted of counseling and education dealing with the complex and emotionally intense issues of symptom management and palliative care in the setting of serious and potentially life-threatening illness.Greater than 50%  of this time was spent counseling and coordinating care related to the above assessment and plan.  Signed by: Altha Harm, PhD, NP-C

## 2019-12-09 NOTE — Progress Notes (Signed)
Patient had recent hospitalization due to pneumonia and follows up with pulmonologist.  Also had a cardioversion due to A-Fib

## 2019-12-15 ENCOUNTER — Telehealth: Payer: Self-pay | Admitting: Pulmonary Disease

## 2019-12-15 ENCOUNTER — Telehealth: Payer: Self-pay | Admitting: Hospice and Palliative Medicine

## 2019-12-15 NOTE — Telephone Encounter (Signed)
Per Dr. Patsey Berthold-- recommend keeping appointment. Can cancel 2-3 days prior if pt is not having any new or worsen sx.    Patient is aware and voiced his understanding.  Nothing further needed.

## 2019-12-15 NOTE — Telephone Encounter (Signed)
I received a call from patient and wife.  They verbalized a desire to proceed with hospice referral and "comfort care."  Case discussed with Dr. Tasia Catchings.  I called in an order for hospice to [facility].

## 2019-12-15 NOTE — Telephone Encounter (Signed)
Darryl White called in today to CXL his PET and Echo that was scheduled for 10/26. He saw Darryl White on 10/19 and he told him that if he didn't feel better from the pneumonia to CXL the appts. Darryl White also wanted to know if he should keep his appt with Darryl White on 12/23/19

## 2019-12-15 NOTE — Telephone Encounter (Signed)
Dr. Gonzalez, please advise. thanks 

## 2019-12-16 ENCOUNTER — Ambulatory Visit: Payer: Medicare PPO

## 2019-12-17 ENCOUNTER — Other Ambulatory Visit: Payer: Self-pay | Admitting: Hospice and Palliative Medicine

## 2019-12-17 MED ORDER — TRAZODONE HCL 50 MG PO TABS: 25.0000 mg | ORAL_TABLET | Freq: Every evening | ORAL | 2 refills | Status: AC | PRN

## 2019-12-17 NOTE — Progress Notes (Signed)
I received a call from patient's hospice nurse, April.  Patient is having difficulty with sleeping.  He feels anxious.  He is able to go to sleep but wakes each night about 1 AM and cannot return to sleep.  We will start low-dose trazodone as needed.

## 2019-12-23 ENCOUNTER — Ambulatory Visit: Payer: Medicare PPO | Admitting: Pulmonary Disease

## 2019-12-23 ENCOUNTER — Other Ambulatory Visit: Payer: Self-pay

## 2019-12-23 ENCOUNTER — Encounter: Payer: Self-pay | Admitting: Pulmonary Disease

## 2019-12-23 VITALS — BP 122/64 | HR 83 | Temp 97.3°F | Ht 66.0 in | Wt 152.6 lb

## 2019-12-23 DIAGNOSIS — R0602 Shortness of breath: Secondary | ICD-10-CM

## 2019-12-23 DIAGNOSIS — C3490 Malignant neoplasm of unspecified part of unspecified bronchus or lung: Secondary | ICD-10-CM | POA: Diagnosis not present

## 2019-12-23 DIAGNOSIS — J449 Chronic obstructive pulmonary disease, unspecified: Secondary | ICD-10-CM

## 2019-12-23 DIAGNOSIS — J9611 Chronic respiratory failure with hypoxia: Secondary | ICD-10-CM

## 2019-12-23 MED ORDER — BREZTRI AEROSPHERE 160-9-4.8 MCG/ACT IN AERO: 2.0000 | INHALATION_SPRAY | Freq: Two times a day (BID) | RESPIRATORY_TRACT | 0 refills | Status: AC

## 2019-12-23 NOTE — Progress Notes (Signed)
Subjective:    Patient ID: Darryl White, male    DOB: August 30, 1940, 79 y.o.   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  Patient is currently under hospice care.  He presents today for routine follow-up.  It is becoming increasingly difficult for him to present to the visit so from here on we will see him as needed.  He is on Breztri for control of his COPD symptoms.  Recall that he has tried nebulizers previously but these make him cough and he does not get relief from them.  He prefers the [employer].  The issue is that hospice does not cover this medication.  Patient was noted to have recurrent of small cell carcinoma, extensive.   Review of Systems A 10 point review of systems was performed and it is as noted above otherwise negative.  Allergies  Allergen Reactions  . Sulfa Antibiotics Hives and Itching  . Penicillins Hives and Palpitations    Did it involve swelling of the face/tongue/throat, SOB, or low BP? No Did it involve sudden or severe rash/hives, skin peeling, or any reaction on the inside of your mouth or nose? No Did you need to seek medical attention at a hospital or doctor's office? Unknown When did it last happen?childhood allergy If all above answers are "NO", may proceed with cephalosporin use.    Current Meds  Medication Sig  . acetaminophen (ACETAMINOPHEN 8 HOUR) 650 MG CR tablet Take 650 mg by mouth every 8 (eight) hours as needed for pain.  Marland Kitchen albuterol (PROAIR HFA) 108 (90 Base) MCG/ACT inhaler Inhale 1-2 puffs into the lungs every 6 (six) hours as needed for wheezing or shortness of breath.  Marland Kitchen BREZTRI AEROSPHERE 160-9-4.8 MCG/ACT AERO INHALE 2 PUFFS INTO THE LUNGS TWICE DAILY  . brimonidine (ALPHAGAN) 0.15 % ophthalmic solution Place 1 drop into both eyes 2 (two) times daily.   .  Cholecalciferol (VITAMIN D3) 50 MCG (2000 UT) capsule Take 1 capsule (2,000 Units total) by mouth daily with lunch.  . diltiazem (CARDIZEM CD) 120 MG 24 hr capsule Take 1 capsule (120 mg total) by mouth daily.  Marland Kitchen ELIQUIS 5 MG TABS tablet TAKE 1 TABLET BY MOUTH TWICE DAILY (Patient taking differently: Take 5 mg by mouth 2 (two) times daily. )  . famotidine (PEPCID) 20 MG tablet Take 20 mg by mouth 2 (two) times daily as needed for heartburn or indigestion.  . fenofibrate (TRICOR) 145 MG tablet TAKE 1 TABLET(145 MG) BY MOUTH DAILY (Patient taking differently: Take 145 mg by mouth daily. )  . flecainide (TAMBOCOR) 100 MG tablet Take 1 tablet (100 mg total) by mouth 2 (two) times daily.  . folic acid (FOLVITE) 1 MG tablet Take 1 mg by mouth daily.  . furosemide (LASIX) 20 MG tablet Take 2 tablets (40 mg) by mouth once every other day  . gabapentin (NEURONTIN) 100 MG capsule Take 100 mg by mouth 3 (three) times daily.  Marland Kitchen ipratropium-albuterol (DUONEB) 0.5-2.5 (3) MG/3ML SOLN Take 3 mLs by nebulization every 6 (six) hours as needed.  . lidocaine-prilocaine (EMLA) cream Apply to affected area once (Patient taking differently: Apply 1 application topically daily as needed (port flush). )  . methotrexate 2.5 MG tablet Take 7.5 mg by mouth once a week.   . ondansetron (ZOFRAN) 8 MG tablet Take 1 tablet (  8 mg total) by mouth 2 (two) times daily as needed for refractory nausea / vomiting. Start on day 3 after carboplatin chemo.  . predniSONE (DELTASONE) 20 MG tablet Take 1 tablet daily or as directed by physician  . sodium chloride (OCEAN) 0.65 % SOLN nasal spray Place 1 spray into both nostrils at bedtime as needed for congestion.   . traMADol (ULTRAM) 50 MG tablet Take 25 mg by mouth 2 (two) times daily.   . traZODone (DESYREL) 50 MG tablet Take 0.5-1 tablets (25-50 mg total) by mouth at bedtime as needed for sleep.  . [DISCONTINUED] doxycycline (VIBRA-TABS) 100 MG tablet Take 1 tablet (100 mg total) by mouth  2 (two) times daily.   Immunization History  Administered Date(s) Administered  . Influenza, High Dose Seasonal PF 12/15/2016, 12/08/2017, 10/26/2018  . Influenza, Seasonal, Injecte, Preservative Fre 12/09/2017  . Influenza,inj,Quad PF,6+ Mos 12/15/2016  . Influenza-Unspecified 11/24/2015, 11/30/2019  . PFIZER SARS-COV-2 Vaccination 03/03/2019, 03/24/2019  . Pneumococcal Conjugate-13 01/30/2014  . Pneumococcal Polysaccharide-23 11/25/2015  . Td 05/04/2016  . Tdap 05/04/2016  . Zoster 10/21/2011  . Zoster Recombinat (Shingrix) 09/17/2018, 11/18/2018       Objective:   Physical Exam BP 122/64 (BP Location: Left Arm, Cuff Size: Normal)   Pulse 83   Temp (!) 97.3 F (36.3 C) (Temporal)   Ht 5\' 6"  (1.676 m)   Wt 152 lb 9.6 oz (69.2 kg)   SpO2 98%   BMI 24.63 kg/m  GENERAL: Well-developed, well-nourished male in no acute distress, he is wearing oxygen at 4 L/min.  Presents in transport chair. HEAD: Normocephalic, atraumatic.  EYES: Pupils equal, round, reactive to light.  No scleral icterus.  MOUTH: Nose/mouth/throat not examined due to masking requirements for COVID 19. NECK: Supple. No thyromegaly. Trachea midline. No JVD.  No adenopathy. PULMONARY: Good air entry bilaterally.  Coarse, no wheezes noted.  No rhonchi. CARDIOVASCULAR: S1 and S2. Regular rate and rhythm.  No rubs, murmurs or gallops heard.  Pacemaker in situ. ABDOMEN: Benign. MUSCULOSKELETAL: No joint deformity, no clubbing, there is +2 edema of the ankles.Marland Kitchen  NEUROLOGIC: No overt focal deficit, speech is fluent. SKIN: Intact,warm,dry. PSYCH: Depressed mood, normal behavior.       Assessment & Plan:     ICD-10-CM   1. Shortness of breath  R06.02    Due to extensive small cell carcinoma of the lung Extensive pulmonary involvement Severe COPD  2. COPD with chronic bronchitis and emphysema ([place])  J44.9    Prefers Breztri Nebulizers make him worse Provided him with samples He is currently entering  hospice Will keep providing [name] is able for palliation   3. Small cell lung cancer ([facility])  C34.90    Extensive disease  4. Chronic respiratory failure with hypoxia ([facility])  J96.11    Continue oxygen supplementation    Meds ordered this encounter  Medications  . Budeson-Glycopyrrol-Formoterol (BREZTRI AEROSPHERE) 160-9-4.8 MCG/ACT AERO    Sig: Inhale 2 puffs into the lungs in the morning and at bedtime.    Dispense:  10.7 g    Refill:  0    Order Specific Question:   Lot Number?    Answer:   [id number]    Order Specific Question:   Expiration Date?    Answer:   06/20/2021    Order Specific Question:   Manufacturer?    Answer:   [employer] [[id number]]    Order Specific Question:   Quantity    Answer:   4   The patient has  currently enrolled in hospice care.  His prognosis overall is exceedingly poor.  We discussed end-of-life issues today.  Darryl White and his wife are at peace with decision.  Follow-up here as needed.   Renold Don, MD [facility]   *This note was dictated using voice recognition software/Dragon.  Despite best efforts to proofread, errors can occur which can change the meaning.  Any change was purely unintentional.

## 2019-12-23 NOTE — Patient Instructions (Signed)
We have given you samples of Breztri, let us know when you need more.  We will see you as needed.  Hospice can call us if there is any need for any pulmonary issues.

## 2020-01-01 ENCOUNTER — Telehealth: Payer: Self-pay | Admitting: Hospice and Palliative Medicine

## 2020-01-01 NOTE — Telephone Encounter (Signed)
Patient's wife is currently hospitalized with new cancer diagnosis.  At family's request, I spoke with hospice nurse, April.  Hospice is working on trying to transfer patient to hospice [facility] for respite care.  I would strongly support this decision.

## 2020-01-20 ENCOUNTER — Telehealth: Payer: Self-pay | Admitting: Internal Medicine

## 2020-01-20 NOTE — Telephone Encounter (Signed)
Patient deceased wife wants to know what to do with home monitor device     Please route to [place]

## 2020-01-21 NOTE — Telephone Encounter (Signed)
I spoke with the pt wife and verified the address. I told her the return kit will take 7-10 business days. She verbalized understanding and I gave her my condolences.

## 2020-01-21 DEATH — deceased

## 2020-02-10 ENCOUNTER — Encounter: Payer: Medicare PPO | Admitting: Internal Medicine

## 2020-04-09 ENCOUNTER — Ambulatory Visit: Payer: Medicare PPO | Admitting: Radiation Oncology

## 2020-06-09 ENCOUNTER — Encounter: Payer: Self-pay | Admitting: Pulmonary Disease

## 2020-10-14 IMAGING — CR DG CHEST 2V
3 series · 3 of 3 positions shown · non-contrast
Comparison: PET-CT 03/20/2019. Chest CT 02/27/2019. Chest x-ray
04/11/2014.

CLINICAL DATA: Suspected sepsis.

EXAM:
CHEST - 2 VIEW

[chest pa]
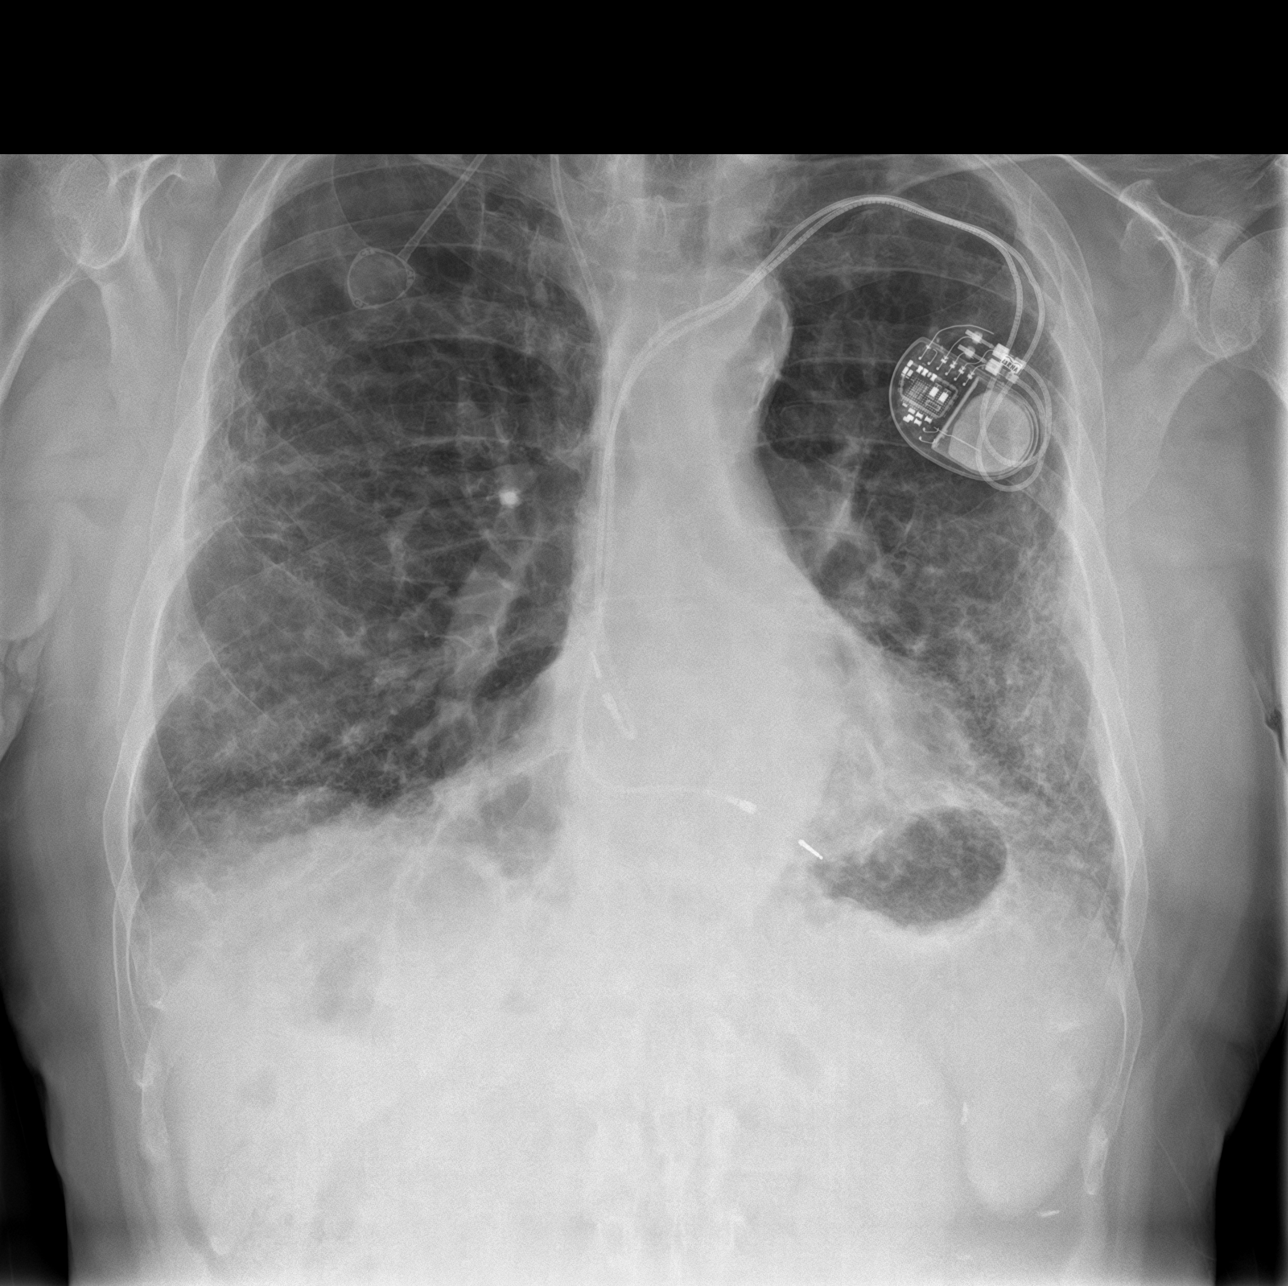

[chest lat (1 of 2)]
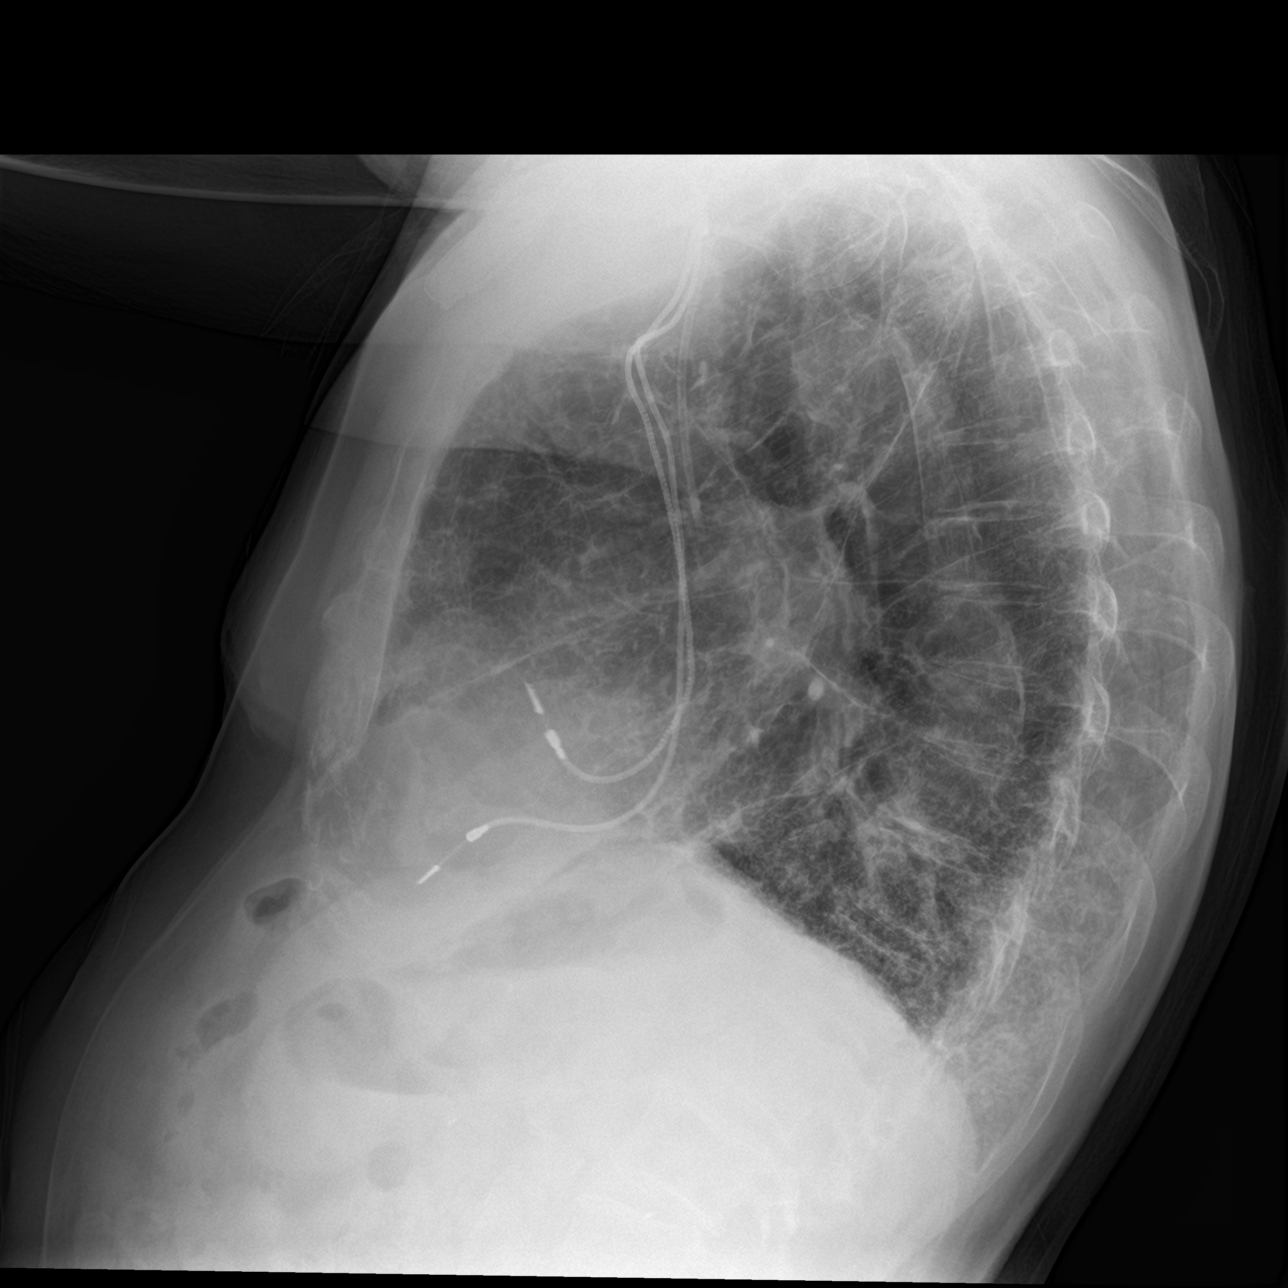

[chest lat (2 of 2)]
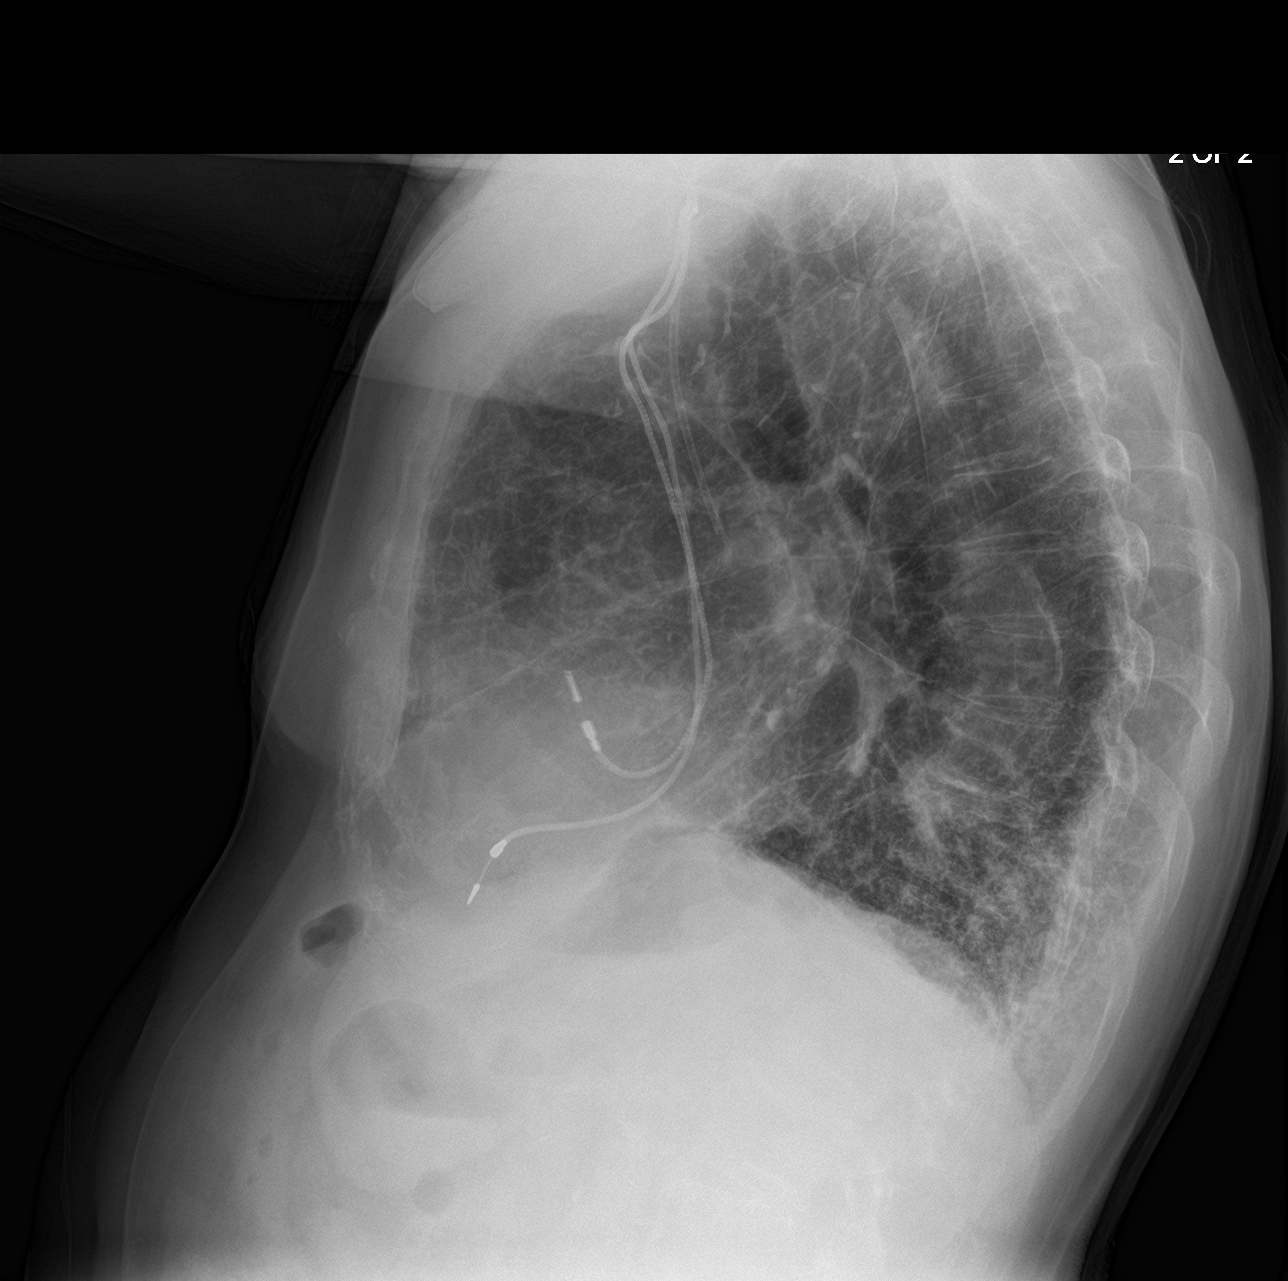

[3 of 3 positions shown; findings below may reference images not displayed]

FINDINGS: PowerPort catheter noted with tip over the cavoatrial junction.
Cardiac pacer stable position. Heart size normal. COPD. Chronic
bilateral interstitial prominence noted. Superimposed active
pneumonitis cannot be excluded. No focal alveolar infiltrate. No
pleural effusion or pneumothorax.
IMPRESSION: 1.  PowerPort catheter noted with tip over SVC.

2.  Cardiac pacer stable position.  Heart size normal.

3. COPD. Chronic interstitial disease. Superimposed active
pneumonitis cannot be excluded. No focal alveolar infiltrate noted.
